# Patient Record
Sex: Male | Born: 1950 | Race: White | Hispanic: No | Marital: Married | Smoking: Former smoker
Health system: Southern US, Community
[De-identification: ages and names within clinical notes are randomized; demographics above are authoritative.]

## PROBLEM LIST (undated history)

## (undated) DIAGNOSIS — L709 Acne, unspecified: Secondary | ICD-10-CM

## (undated) DIAGNOSIS — F419 Anxiety disorder, unspecified: Secondary | ICD-10-CM

## (undated) DIAGNOSIS — K4090 Unilateral inguinal hernia, without obstruction or gangrene, not specified as recurrent: Secondary | ICD-10-CM

## (undated) DIAGNOSIS — I10 Essential (primary) hypertension: Secondary | ICD-10-CM

## (undated) DIAGNOSIS — R7303 Prediabetes: Secondary | ICD-10-CM

## (undated) DIAGNOSIS — L719 Rosacea, unspecified: Secondary | ICD-10-CM

## (undated) DIAGNOSIS — E785 Hyperlipidemia, unspecified: Secondary | ICD-10-CM

## (undated) DIAGNOSIS — M1990 Unspecified osteoarthritis, unspecified site: Secondary | ICD-10-CM

## (undated) DIAGNOSIS — I4892 Unspecified atrial flutter: Secondary | ICD-10-CM

## (undated) DIAGNOSIS — K648 Other hemorrhoids: Secondary | ICD-10-CM

## (undated) DIAGNOSIS — N200 Calculus of kidney: Secondary | ICD-10-CM

## (undated) DIAGNOSIS — M2550 Pain in unspecified joint: Secondary | ICD-10-CM

## (undated) DIAGNOSIS — E119 Type 2 diabetes mellitus without complications: Secondary | ICD-10-CM

## (undated) DIAGNOSIS — Z87442 Personal history of urinary calculi: Secondary | ICD-10-CM

## (undated) DIAGNOSIS — E039 Hypothyroidism, unspecified: Secondary | ICD-10-CM

## (undated) HISTORY — DX: Unspecified atrial flutter: I48.92

## (undated) HISTORY — PX: COLONOSCOPY: SHX174

## (undated) HISTORY — PX: TONSILLECTOMY: SUR1361

## (undated) HISTORY — DX: Calculus of kidney: N20.0

---

## 2002-04-18 ENCOUNTER — Ambulatory Visit (HOSPITAL_COMMUNITY): Admission: RE | Admit: 2002-04-18 | Discharge: 2002-04-18 | Payer: Self-pay | Admitting: Gastroenterology

## 2015-02-20 ENCOUNTER — Other Ambulatory Visit: Payer: Self-pay | Admitting: Family Medicine

## 2015-02-20 DIAGNOSIS — M545 Low back pain: Secondary | ICD-10-CM

## 2015-02-25 ENCOUNTER — Ambulatory Visit
Admission: RE | Admit: 2015-02-25 | Discharge: 2015-02-25 | Disposition: A | Discharge status: Home / Self Care | Payer: Medicare Other | Source: Ambulatory Visit | Attending: Family Medicine | Admitting: Family Medicine

## 2015-02-25 DIAGNOSIS — M545 Low back pain: Secondary | ICD-10-CM

## 2015-06-23 ENCOUNTER — Other Ambulatory Visit (HOSPITAL_COMMUNITY): Payer: Self-pay | Admitting: Neurological Surgery

## 2015-06-27 ENCOUNTER — Ambulatory Visit (HOSPITAL_COMMUNITY)
Admission: RE | Admit: 2015-06-27 | Discharge: 2015-06-27 | Disposition: A | Discharge status: Home / Self Care | Payer: Medicare Other | Source: Ambulatory Visit | Attending: Neurological Surgery | Admitting: Neurological Surgery

## 2015-06-27 ENCOUNTER — Encounter (HOSPITAL_COMMUNITY)
Admission: RE | Admit: 2015-06-27 | Discharge: 2015-06-27 | Disposition: A | Discharge status: Home / Self Care | Payer: Medicare Other | Source: Ambulatory Visit | Attending: Neurological Surgery | Admitting: Neurological Surgery

## 2015-06-27 ENCOUNTER — Encounter (HOSPITAL_COMMUNITY): Payer: Self-pay

## 2015-06-27 DIAGNOSIS — Z01818 Encounter for other preprocedural examination: Secondary | ICD-10-CM | POA: Diagnosis present

## 2015-06-27 DIAGNOSIS — I10 Essential (primary) hypertension: Secondary | ICD-10-CM | POA: Insufficient documentation

## 2015-06-27 DIAGNOSIS — Z01810 Encounter for preprocedural cardiovascular examination: Secondary | ICD-10-CM | POA: Insufficient documentation

## 2015-06-27 DIAGNOSIS — M4806 Spinal stenosis, lumbar region: Secondary | ICD-10-CM | POA: Diagnosis not present

## 2015-06-27 DIAGNOSIS — Z01812 Encounter for preprocedural laboratory examination: Secondary | ICD-10-CM | POA: Insufficient documentation

## 2015-06-27 DIAGNOSIS — R7303 Prediabetes: Secondary | ICD-10-CM | POA: Diagnosis not present

## 2015-06-27 DIAGNOSIS — M48061 Spinal stenosis, lumbar region without neurogenic claudication: Secondary | ICD-10-CM

## 2015-06-27 HISTORY — DX: Essential (primary) hypertension: I10

## 2015-06-27 HISTORY — DX: Hypothyroidism, unspecified: E03.9

## 2015-06-27 HISTORY — DX: Type 2 diabetes mellitus without complications: E11.9

## 2015-06-27 HISTORY — DX: Unspecified osteoarthritis, unspecified site: M19.90

## 2015-06-27 LAB — CBC WITH DIFFERENTIAL/PLATELET
Basophils Absolute: 0 10*3/uL (ref 0.0–0.1)
Basophils Relative: 1 %
EOS ABS: 0.1 10*3/uL (ref 0.0–0.7)
EOS PCT: 2 %
HCT: 44.4 % (ref 39.0–52.0)
Hemoglobin: 15.3 g/dL (ref 13.0–17.0)
LYMPHS ABS: 2.2 10*3/uL (ref 0.7–4.0)
LYMPHS PCT: 33 %
MCH: 32.2 pg (ref 26.0–34.0)
MCHC: 34.5 g/dL (ref 30.0–36.0)
MCV: 93.5 fL (ref 78.0–100.0)
MONO ABS: 0.5 10*3/uL (ref 0.1–1.0)
MONOS PCT: 8 %
Neutro Abs: 3.7 10*3/uL (ref 1.7–7.7)
Neutrophils Relative %: 56 %
PLATELETS: 185 10*3/uL (ref 150–400)
RBC: 4.75 MIL/uL (ref 4.22–5.81)
RDW: 12.4 % (ref 11.5–15.5)
WBC: 6.6 10*3/uL (ref 4.0–10.5)

## 2015-06-27 LAB — BASIC METABOLIC PANEL
Anion gap: 7 (ref 5–15)
BUN: 22 mg/dL — AB (ref 6–20)
CO2: 27 mmol/L (ref 22–32)
CREATININE: 0.92 mg/dL (ref 0.61–1.24)
Calcium: 9.6 mg/dL (ref 8.9–10.3)
Chloride: 105 mmol/L (ref 101–111)
GFR calc non Af Amer: >60 mL/min (ref >60)
GLUCOSE: 115 mg/dL — AB (ref 65–99)
Potassium: 4 mmol/L (ref 3.5–5.1)
Sodium: 139 mmol/L (ref 135–145)

## 2015-06-27 LAB — PROTIME-INR
INR: 1.13 (ref 0.00–1.49)
PROTHROMBIN TIME: 14.7 s (ref 11.6–15.2)

## 2015-06-27 LAB — SURGICAL PCR SCREEN
MRSA, PCR: NEGATIVE
STAPHYLOCOCCUS AUREUS: NEGATIVE

## 2015-06-27 LAB — GLUCOSE, CAPILLARY: Glucose-Capillary: 122 mg/dL — ABNORMAL HIGH (ref 65–99)

## 2015-06-27 NOTE — Pre-Procedure Instructions (Addendum)
Norman Hudson  06/27/2015      [facility] - [place], [place] - [address] [place] [address] Phone: [phone] Fax: [phone]    Your procedure is scheduled on Nov 28.  Report to [facility] Admitting at 530 A.M.  Call this number if you have problems the morning of surgery:  [phone]   Remember:  Do not eat food or drink liquids after midnight.  Take these medicines the morning of surgery with A SIP OF WATER: amlodipine (Norvasc), levothyroxine (Synthroid), Percocet if needed, valtrex  Stop taking aspirin, Ibuprofen, Aleve, BC's, Goody's, Herbal medications, Fish Oil, Multi vit, Zinc for 5 days before your surgery How to Manage Your Diabetes Before Surgery   Why is it important to control my blood sugar before and after surgery?   Improving blood sugar levels before and after surgery helps healing and can limit problems.  A way of improving blood sugar control is eating a healthy diet by:  - Eating less sugar and carbohydrates  - Increasing activity/exercise  - Talk with your doctor about reaching your blood sugar goals  High blood sugars (greater than 180 mg/dL) can raise your risk of infections and slow down your recovery so you will need to focus on controlling your diabetes during the weeks before surgery.  Make sure that the doctor who takes care of your diabetes knows about your planned surgery including the date and location.  How do I manage my blood sugars before surgery?   Check your blood sugar at least 4 times a day, 2 days before surgery to make sure that they are not too high or low.   Check your blood sugar the morning of your surgery when you wake up and every 2               hours until you get to the Short-Stay unit.  If your blood sugar is less than 70 mg/dL, you will need to treat for low blood sugar by:  Treat a low blood sugar (less than 70 mg/dL) with 1/2 cup of  clear juice (cranberry or apple), 4 glucose tablets, OR glucose gel.  Recheck blood sugar in 15 minutes after treatment (to make sure it is greater than 70 mg/dL).  If blood sugar is not greater than 70 mg/dL on re-check, call [phone] for further instructions.   Report your blood sugar to the Short-Stay nurse when you get to Short-Stay.  References:  [facility], 2007 "How to Manage your Diabetes Before and After Surgery".  What do I do about my diabetes medications?   Do not take oral diabetes medicines (pills) the morning of surgery.   If your CBG is greater than 220 mg/dL, you may take 1/2 of your sliding scale (correction) dose of insulin.    Do not wear jewelry, make-up or nail polish.  Do not wear lotions, powders, or perfumes.  You may wear deodorant.  Do not shave 48 hours prior to surgery.  Men may shave face and neck.  Do not bring valuables to the hospital.  [facility] is not responsible for any belongings or valuables.  Contacts, dentures or bridgework may not be worn into surgery.  Leave your suitcase in the car.  After surgery it may be brought to your room.  For patients admitted to the hospital, discharge time will be determined by your treatment team.  Patients discharged the day of  surgery will not be allowed to drive home.    Special instructions:  [place] - Preparing for Surgery  Before surgery, you can play an important role.  Because skin is not sterile, your skin needs to be as free of germs as possible.  You can reduce the number of germs on you skin by washing with CHG (chlorahexidine gluconate) soap before surgery.  CHG is an antiseptic cleaner which kills germs and bonds with the skin to continue killing germs even after washing.  Please DO NOT use if you have an allergy to CHG or antibacterial soaps.  If your skin becomes reddened/irritated stop using the CHG and inform your nurse when you arrive at Short Stay.  Do  not shave (including legs and underarms) for at least 48 hours prior to the first CHG shower.  You may shave your face.  Please follow these instructions carefully:   1.  Shower with CHG Soap the night before surgery and the    morning of Surgery.  2.  If you choose to wash your hair, wash your hair first as usual with your       normal shampoo.  3.  After you shampoo, rinse your hair and body thoroughly to remove the                      Shampoo.  4.  Use CHG as you would any other liquid soap.  You can apply chg directly       to the skin and wash gently with scrungie or a clean washcloth.  5.  Apply the CHG Soap to your body ONLY FROM THE NECK DOWN.        Do not use on open wounds or open sores.  Avoid contact with your eyes,       ears, mouth and genitals (private parts).  Wash genitals (private parts)       with your normal soap.  6.  Wash thoroughly, paying special attention to the area where your surgery        will be performed.  7.  Thoroughly rinse your body with warm water from the neck down.  8.  DO NOT shower/wash with your normal soap after using and rinsing off       the CHG Soap.  9.  Pat yourself dry with a clean towel.            10.  Wear clean pajamas.            11.  Place clean sheets on your bed the night of your first shower and do not        sleep with pets.  Day of Surgery  Do not apply any lotions/deoderants the morning of surgery.  Please wear clean clothes to the hospital/surgery center.     Please read over the following fact sheets that you were given. Pain Booklet, Coughing and Deep Breathing, MRSA Information and Surgical Site Infection Prevention

## 2015-06-27 NOTE — Progress Notes (Signed)
PCP is Dr Darcus Austin Denies ever seeing a cardiologist. Denies having a recent EKG. Denies ever having a card cath, stress test, or echo. States he had had 2 sleep studies done, but they were both neg for sleep apnea.

## 2015-06-28 LAB — HEMOGLOBIN A1C
Hgb A1c MFr Bld: 6.1 % — ABNORMAL HIGH (ref 4.8–5.6)
Mean Plasma Glucose: 128 mg/dL

## 2015-07-06 MED ORDER — DEXAMETHASONE SODIUM PHOSPHATE 10 MG/ML IJ SOLN
10.0000 mg | INTRAMUSCULAR | Status: DC
  Filled 2015-07-06: qty 1

## 2015-07-06 MED ORDER — CEFAZOLIN SODIUM-DEXTROSE 2-3 GM-% IV SOLR
2.0000 g | INTRAVENOUS | Status: AC
  Administered 2015-07-07: 2 g via INTRAVENOUS
  Filled 2015-07-06: qty 50

## 2015-07-07 ENCOUNTER — Ambulatory Visit (HOSPITAL_COMMUNITY)
Admission: RE | Admit: 2015-07-07 | Discharge: 2015-07-08 | Disposition: A | Discharge status: Home / Self Care | Payer: Medicare Other | Source: Ambulatory Visit | Attending: Neurological Surgery | Admitting: Neurological Surgery

## 2015-07-07 ENCOUNTER — Inpatient Hospital Stay (HOSPITAL_COMMUNITY): Payer: Medicare Other

## 2015-07-07 ENCOUNTER — Encounter (HOSPITAL_COMMUNITY): Payer: Self-pay | Admitting: Neurological Surgery

## 2015-07-07 ENCOUNTER — Inpatient Hospital Stay (HOSPITAL_COMMUNITY): Payer: Medicare Other | Admitting: Certified Registered"

## 2015-07-07 ENCOUNTER — Encounter (HOSPITAL_COMMUNITY)
Admission: RE | Disposition: A | Discharge status: Home / Self Care | Payer: Self-pay | Source: Ambulatory Visit | Attending: Neurological Surgery

## 2015-07-07 DIAGNOSIS — M1990 Unspecified osteoarthritis, unspecified site: Secondary | ICD-10-CM | POA: Diagnosis not present

## 2015-07-07 DIAGNOSIS — I10 Essential (primary) hypertension: Secondary | ICD-10-CM | POA: Insufficient documentation

## 2015-07-07 DIAGNOSIS — E039 Hypothyroidism, unspecified: Secondary | ICD-10-CM | POA: Diagnosis not present

## 2015-07-07 DIAGNOSIS — Z9889 Other specified postprocedural states: Secondary | ICD-10-CM

## 2015-07-07 DIAGNOSIS — Z79899 Other long term (current) drug therapy: Secondary | ICD-10-CM | POA: Diagnosis not present

## 2015-07-07 DIAGNOSIS — M4316 Spondylolisthesis, lumbar region: Secondary | ICD-10-CM | POA: Diagnosis not present

## 2015-07-07 DIAGNOSIS — Z419 Encounter for procedure for purposes other than remedying health state, unspecified: Secondary | ICD-10-CM

## 2015-07-07 DIAGNOSIS — Z7982 Long term (current) use of aspirin: Secondary | ICD-10-CM | POA: Insufficient documentation

## 2015-07-07 DIAGNOSIS — Z7984 Long term (current) use of oral hypoglycemic drugs: Secondary | ICD-10-CM | POA: Diagnosis not present

## 2015-07-07 DIAGNOSIS — M4806 Spinal stenosis, lumbar region: Secondary | ICD-10-CM | POA: Diagnosis not present

## 2015-07-07 DIAGNOSIS — R7303 Prediabetes: Secondary | ICD-10-CM | POA: Insufficient documentation

## 2015-07-07 HISTORY — PX: LUMBAR LAMINECTOMY WITH COFLEX 1 LEVEL: SHX6514

## 2015-07-07 LAB — GLUCOSE, CAPILLARY
GLUCOSE-CAPILLARY: 122 mg/dL — AB (ref 65–99)
GLUCOSE-CAPILLARY: 120 mg/dL — AB (ref 65–99)
Glucose-Capillary: 143 mg/dL — ABNORMAL HIGH (ref 65–99)
Glucose-Capillary: 160 mg/dL — ABNORMAL HIGH (ref 65–99)
Glucose-Capillary: 138 mg/dL — ABNORMAL HIGH (ref 65–99)

## 2015-07-07 SURGERY — LUMBAR LAMINECTOMY WITH COFLEX 1 LEVEL
Anesthesia: General | Site: Spine Lumbar

## 2015-07-07 MED ORDER — OXYCODONE HCL 5 MG PO TABS: 5.0000 mg | ORAL_TABLET | Freq: Once | ORAL | Status: DC | PRN

## 2015-07-07 MED ORDER — 0.9 % SODIUM CHLORIDE (POUR BTL) OPTIME
TOPICAL | Status: DC | PRN
  Administered 2015-07-07: 1000 mL

## 2015-07-07 MED ORDER — MORPHINE SULFATE (PF) 2 MG/ML IV SOLN
INTRAVENOUS | Status: AC
  Filled 2015-07-07: qty 1

## 2015-07-07 MED ORDER — HYDROMORPHONE HCL 1 MG/ML IJ SOLN
INTRAMUSCULAR | Status: AC
  Filled 2015-07-07: qty 1

## 2015-07-07 MED ORDER — EPHEDRINE SULFATE 50 MG/ML IJ SOLN
INTRAMUSCULAR | Status: DC | PRN
  Administered 2015-07-07: 10 mg via INTRAVENOUS

## 2015-07-07 MED ORDER — METHOCARBAMOL 1000 MG/10ML IJ SOLN
500.0000 mg | Freq: Four times a day (QID) | INTRAVENOUS | Status: DC | PRN
  Filled 2015-07-07: qty 5

## 2015-07-07 MED ORDER — HEMOSTATIC AGENTS (NO CHARGE) OPTIME
TOPICAL | Status: DC | PRN
  Administered 2015-07-07: 1 via TOPICAL

## 2015-07-07 MED ORDER — GLYCOPYRROLATE 0.2 MG/ML IJ SOLN
INTRAMUSCULAR | Status: AC
  Filled 2015-07-07: qty 3

## 2015-07-07 MED ORDER — MIDAZOLAM HCL 5 MG/5ML IJ SOLN
INTRAMUSCULAR | Status: DC | PRN
  Administered 2015-07-07: 2 mg via INTRAVENOUS

## 2015-07-07 MED ORDER — ONDANSETRON HCL 4 MG/2ML IJ SOLN
INTRAMUSCULAR | Status: DC | PRN
  Administered 2015-07-07: 4 mg via INTRAVENOUS

## 2015-07-07 MED ORDER — SODIUM CHLORIDE 0.9 % IJ SOLN: 3.0000 mL | INTRAMUSCULAR | Status: DC | PRN

## 2015-07-07 MED ORDER — ROCURONIUM BROMIDE 50 MG/5ML IV SOLN
INTRAVENOUS | Status: AC
  Filled 2015-07-07: qty 1

## 2015-07-07 MED ORDER — SUCCINYLCHOLINE CHLORIDE 20 MG/ML IJ SOLN
INTRAMUSCULAR | Status: AC
  Filled 2015-07-07: qty 1

## 2015-07-07 MED ORDER — PHENYLEPHRINE 40 MCG/ML (10ML) SYRINGE FOR IV PUSH (FOR BLOOD PRESSURE SUPPORT)
PREFILLED_SYRINGE | INTRAVENOUS | Status: AC
  Filled 2015-07-07: qty 10

## 2015-07-07 MED ORDER — PHENYLEPHRINE HCL 10 MG/ML IJ SOLN
10.0000 mg | INTRAVENOUS | Status: DC | PRN
  Administered 2015-07-07: 30 ug/min via INTRAVENOUS

## 2015-07-07 MED ORDER — AMLODIPINE BESYLATE 5 MG PO TABS
5.0000 mg | ORAL_TABLET | Freq: Two times a day (BID) | ORAL | Status: DC
  Filled 2015-07-07 (×4): qty 1

## 2015-07-07 MED ORDER — FENTANYL CITRATE (PF) 100 MCG/2ML IJ SOLN
INTRAMUSCULAR | Status: DC | PRN
  Administered 2015-07-07 (×2): 50 ug via INTRAVENOUS
  Administered 2015-07-07: 150 ug via INTRAVENOUS

## 2015-07-07 MED ORDER — SODIUM CHLORIDE 0.9 % IV SOLN: 250.0000 mL | INTRAVENOUS | Status: DC

## 2015-07-07 MED ORDER — GLYCOPYRROLATE 0.2 MG/ML IJ SOLN
INTRAMUSCULAR | Status: DC | PRN
  Administered 2015-07-07: 0.6 mg via INTRAVENOUS

## 2015-07-07 MED ORDER — FENTANYL CITRATE (PF) 250 MCG/5ML IJ SOLN
INTRAMUSCULAR | Status: AC
  Filled 2015-07-07: qty 5

## 2015-07-07 MED ORDER — LIDOCAINE HCL (CARDIAC) 20 MG/ML IV SOLN
INTRAVENOUS | Status: AC
  Filled 2015-07-07: qty 5

## 2015-07-07 MED ORDER — PHENYLEPHRINE HCL 10 MG/ML IJ SOLN
INTRAMUSCULAR | Status: DC | PRN
  Administered 2015-07-07 (×4): 40 ug via INTRAVENOUS

## 2015-07-07 MED ORDER — HYDROMORPHONE HCL 1 MG/ML IJ SOLN
0.2500 mg | INTRAMUSCULAR | Status: DC | PRN
  Administered 2015-07-07 (×4): 0.5 mg via INTRAVENOUS

## 2015-07-07 MED ORDER — ONDANSETRON HCL 4 MG/2ML IJ SOLN: 4.0000 mg | INTRAMUSCULAR | Status: DC | PRN

## 2015-07-07 MED ORDER — NEOSTIGMINE METHYLSULFATE 10 MG/10ML IV SOLN
INTRAVENOUS | Status: AC
  Filled 2015-07-07: qty 1

## 2015-07-07 MED ORDER — METHOCARBAMOL 500 MG PO TABS
ORAL_TABLET | ORAL | Status: AC
  Filled 2015-07-07: qty 1

## 2015-07-07 MED ORDER — ALPRAZOLAM 0.5 MG PO TABS: 0.5000 mg | ORAL_TABLET | Freq: Every evening | ORAL | Status: DC | PRN

## 2015-07-07 MED ORDER — HYDROCHLOROTHIAZIDE 25 MG PO TABS: 25.0000 mg | ORAL_TABLET | Freq: Every day | ORAL | Status: DC

## 2015-07-07 MED ORDER — ROCURONIUM BROMIDE 100 MG/10ML IV SOLN
INTRAVENOUS | Status: DC | PRN
  Administered 2015-07-07: 50 mg via INTRAVENOUS

## 2015-07-07 MED ORDER — METHOCARBAMOL 500 MG PO TABS
500.0000 mg | ORAL_TABLET | Freq: Four times a day (QID) | ORAL | Status: DC | PRN
  Administered 2015-07-07 – 2015-07-08 (×4): 500 mg via ORAL
  Filled 2015-07-07 (×3): qty 1

## 2015-07-07 MED ORDER — OXYCODONE-ACETAMINOPHEN 5-325 MG PO TABS
1.0000 | ORAL_TABLET | ORAL | Status: DC | PRN
  Administered 2015-07-07 – 2015-07-08 (×6): 2 via ORAL
  Filled 2015-07-07 (×5): qty 2

## 2015-07-07 MED ORDER — MORPHINE SULFATE (PF) 2 MG/ML IV SOLN
1.0000 mg | INTRAVENOUS | Status: DC | PRN
  Administered 2015-07-07: 2 mg via INTRAVENOUS
  Administered 2015-07-07: 4 mg via INTRAVENOUS
  Administered 2015-07-07 – 2015-07-08 (×2): 2 mg via INTRAVENOUS
  Filled 2015-07-07: qty 2
  Filled 2015-07-07 (×2): qty 1

## 2015-07-07 MED ORDER — THROMBIN 5000 UNITS EX SOLR
CUTANEOUS | Status: DC | PRN
  Administered 2015-07-07 (×2): 5000 [IU] via TOPICAL

## 2015-07-07 MED ORDER — NEOSTIGMINE METHYLSULFATE 10 MG/10ML IV SOLN
INTRAVENOUS | Status: DC | PRN
  Administered 2015-07-07: 4 mg via INTRAVENOUS

## 2015-07-07 MED ORDER — POTASSIUM CHLORIDE IN NACL 20-0.9 MEQ/L-% IV SOLN
INTRAVENOUS | Status: DC
  Filled 2015-07-07 (×3): qty 1000

## 2015-07-07 MED ORDER — MIDAZOLAM HCL 2 MG/2ML IJ SOLN
INTRAMUSCULAR | Status: AC
  Filled 2015-07-07: qty 2

## 2015-07-07 MED ORDER — ONDANSETRON HCL 4 MG/2ML IJ SOLN
INTRAMUSCULAR | Status: AC
  Filled 2015-07-07: qty 2

## 2015-07-07 MED ORDER — ONDANSETRON HCL 4 MG/2ML IJ SOLN: 4.0000 mg | Freq: Four times a day (QID) | INTRAMUSCULAR | Status: DC | PRN

## 2015-07-07 MED ORDER — OXYCODONE-ACETAMINOPHEN 5-325 MG PO TABS
ORAL_TABLET | ORAL | Status: AC
  Filled 2015-07-07: qty 2

## 2015-07-07 MED ORDER — MENTHOL 3 MG MT LOZG: 1.0000 | LOZENGE | OROMUCOSAL | Status: DC | PRN

## 2015-07-07 MED ORDER — PROPOFOL 10 MG/ML IV BOLUS
INTRAVENOUS | Status: AC
  Filled 2015-07-07: qty 20

## 2015-07-07 MED ORDER — THROMBIN 5000 UNITS EX SOLR
OROMUCOSAL | Status: DC | PRN
  Administered 2015-07-07: 5 mL via TOPICAL

## 2015-07-07 MED ORDER — LIDOCAINE HCL (CARDIAC) 20 MG/ML IV SOLN
INTRAVENOUS | Status: DC | PRN
  Administered 2015-07-07: 80 mg via INTRAVENOUS

## 2015-07-07 MED ORDER — VALACYCLOVIR HCL 500 MG PO TABS
500.0000 mg | ORAL_TABLET | Freq: Every day | ORAL | Status: DC
  Filled 2015-07-07: qty 1

## 2015-07-07 MED ORDER — OXYCODONE HCL 5 MG/5ML PO SOLN: 5.0000 mg | Freq: Once | ORAL | Status: DC | PRN

## 2015-07-07 MED ORDER — SODIUM CHLORIDE 0.9 % IJ SOLN
3.0000 mL | Freq: Two times a day (BID) | INTRAMUSCULAR | Status: DC
  Administered 2015-07-07: 3 mL via INTRAVENOUS

## 2015-07-07 MED ORDER — BUPIVACAINE HCL (PF) 0.25 % IJ SOLN
INTRAMUSCULAR | Status: DC | PRN
  Administered 2015-07-07: 3 mL

## 2015-07-07 MED ORDER — ACETAMINOPHEN 650 MG RE SUPP: 650.0000 mg | RECTAL | Status: DC | PRN

## 2015-07-07 MED ORDER — IRBESARTAN 300 MG PO TABS
300.0000 mg | ORAL_TABLET | Freq: Every day | ORAL | Status: DC
  Filled 2015-07-07: qty 1

## 2015-07-07 MED ORDER — PHENOL 1.4 % MT LIQD: 1.0000 | OROMUCOSAL | Status: DC | PRN

## 2015-07-07 MED ORDER — ACETAMINOPHEN 325 MG PO TABS: 650.0000 mg | ORAL_TABLET | ORAL | Status: DC | PRN

## 2015-07-07 MED ORDER — LEVOTHYROXINE SODIUM 112 MCG PO TABS
112.0000 ug | ORAL_TABLET | Freq: Every day | ORAL | Status: DC
  Administered 2015-07-08: 112 ug via ORAL
  Filled 2015-07-07 (×2): qty 1

## 2015-07-07 MED ORDER — PROPOFOL 10 MG/ML IV BOLUS
INTRAVENOUS | Status: DC | PRN
  Administered 2015-07-07: 180 mg via INTRAVENOUS

## 2015-07-07 MED ORDER — VALSARTAN-HYDROCHLOROTHIAZIDE 320-25 MG PO TABS: 1.0000 | ORAL_TABLET | Freq: Every day | ORAL | Status: DC

## 2015-07-07 MED ORDER — METFORMIN HCL 500 MG PO TABS
500.0000 mg | ORAL_TABLET | Freq: Every day | ORAL | Status: DC
  Administered 2015-07-08: 500 mg via ORAL
  Filled 2015-07-07: qty 1

## 2015-07-07 MED ORDER — CEFAZOLIN SODIUM 1-5 GM-% IV SOLN
1.0000 g | Freq: Three times a day (TID) | INTRAVENOUS | Status: AC
  Administered 2015-07-07 (×2): 1 g via INTRAVENOUS
  Filled 2015-07-07 (×2): qty 50

## 2015-07-07 MED ORDER — SODIUM CHLORIDE 0.9 % IR SOLN
Status: DC | PRN
  Administered 2015-07-07: 500 mL

## 2015-07-07 MED ORDER — LACTATED RINGERS IV SOLN
INTRAVENOUS | Status: DC | PRN
  Administered 2015-07-07 (×2): via INTRAVENOUS

## 2015-07-07 SURGICAL SUPPLY — 47 items
BAG DECANTER FOR FLEXI CONT (MISCELLANEOUS) ×2 IMPLANT
BENZOIN TINCTURE PRP APPL 2/3 (GAUZE/BANDAGES/DRESSINGS) ×2 IMPLANT
BUR MATCHSTICK NEURO 3.0 LAGG (BURR) ×2 IMPLANT
CANISTER SUCT 3000ML PPV (MISCELLANEOUS) ×2 IMPLANT
DEVICE COFLEX STABLIZATION 12M (Neuro Prosthesis/Implant) ×2 IMPLANT
DRAPE C-ARM 42X72 X-RAY (DRAPES) ×4 IMPLANT
DRAPE LAPAROTOMY 100X72X124 (DRAPES) ×2 IMPLANT
DRAPE MICROSCOPE LEICA (MISCELLANEOUS) IMPLANT
DRAPE POUCH INSTRU U-SHP 10X18 (DRAPES) ×2 IMPLANT
DRAPE SURG 17X23 STRL (DRAPES) ×2 IMPLANT
DRSG OPSITE 4X5.5 SM (GAUZE/BANDAGES/DRESSINGS) ×2 IMPLANT
DRSG OPSITE POSTOP 4X6 (GAUZE/BANDAGES/DRESSINGS) ×2 IMPLANT
DURAPREP 26ML APPLICATOR (WOUND CARE) ×2 IMPLANT
ELECT REM PT RETURN 9FT ADLT (ELECTROSURGICAL) ×2
ELECTRODE REM PT RTRN 9FT ADLT (ELECTROSURGICAL) ×1 IMPLANT
EVACUATOR 1/8 PVC DRAIN (DRAIN) ×2 IMPLANT
GAUZE SPONGE 4X4 16PLY XRAY LF (GAUZE/BANDAGES/DRESSINGS) IMPLANT
GLOVE BIO SURGEON STRL SZ8 (GLOVE) ×2 IMPLANT
GLOVE BIOGEL PI IND STRL 7.0 (GLOVE) ×3 IMPLANT
GLOVE BIOGEL PI IND STRL 7.5 (GLOVE) ×1 IMPLANT
GLOVE BIOGEL PI INDICATOR 7.0 (GLOVE) ×3
GLOVE BIOGEL PI INDICATOR 7.5 (GLOVE) ×1
GLOVE ECLIPSE 8.0 STRL XLNG CF (GLOVE) ×2 IMPLANT
GOWN STRL REUS W/ TWL LRG LVL3 (GOWN DISPOSABLE) ×1 IMPLANT
GOWN STRL REUS W/ TWL XL LVL3 (GOWN DISPOSABLE) ×2 IMPLANT
GOWN STRL REUS W/TWL 2XL LVL3 (GOWN DISPOSABLE) IMPLANT
GOWN STRL REUS W/TWL LRG LVL3 (GOWN DISPOSABLE) ×1
GOWN STRL REUS W/TWL XL LVL3 (GOWN DISPOSABLE) ×2
HEMOSTAT POWDER KIT SURGIFOAM (HEMOSTASIS) ×2 IMPLANT
KIT BASIN OR (CUSTOM PROCEDURE TRAY) ×2 IMPLANT
KIT ROOM TURNOVER OR (KITS) ×2 IMPLANT
NEEDLE HYPO 25X1 1.5 SAFETY (NEEDLE) ×2 IMPLANT
NEEDLE SPNL 20GX3.5 QUINCKE YW (NEEDLE) IMPLANT
NS IRRIG 1000ML POUR BTL (IV SOLUTION) ×2 IMPLANT
PACK LAMINECTOMY NEURO (CUSTOM PROCEDURE TRAY) ×2 IMPLANT
PAD ARMBOARD 7.5X6 YLW CONV (MISCELLANEOUS) ×10 IMPLANT
RUBBERBAND STERILE (MISCELLANEOUS) IMPLANT
SPONGE SURGIFOAM ABS GEL SZ50 (HEMOSTASIS) ×2 IMPLANT
STRIP CLOSURE SKIN 1/2X4 (GAUZE/BANDAGES/DRESSINGS) ×2 IMPLANT
SUT BONE WAX W31G (SUTURE) ×2 IMPLANT
SUT VIC AB 0 CT1 18XCR BRD8 (SUTURE) ×1 IMPLANT
SUT VIC AB 0 CT1 8-18 (SUTURE) ×1
SUT VIC AB 2-0 CP2 18 (SUTURE) ×2 IMPLANT
SUT VIC AB 3-0 SH 8-18 (SUTURE) ×2 IMPLANT
TOWEL OR 17X24 6PK STRL BLUE (TOWEL DISPOSABLE) IMPLANT
TOWEL OR 17X26 10 PK STRL BLUE (TOWEL DISPOSABLE) ×2 IMPLANT
WATER STERILE IRR 1000ML POUR (IV SOLUTION) ×2 IMPLANT

## 2015-07-07 NOTE — Anesthesia Procedure Notes (Signed)
Procedure Name: Intubation Date/Time: 07/07/2015 7:32 AM Performed by: Lavell Luster Pre-anesthesia Checklist: Patient identified, Emergency Drugs available, Suction available, Patient being monitored and Timeout performed Patient Re-evaluated:Patient Re-evaluated prior to inductionOxygen Delivery Method: Circle system utilized Preoxygenation: Pre-oxygenation with 100% oxygen Intubation Type: IV induction and Cricoid Pressure applied Ventilation: Mask ventilation without difficulty Laryngoscope Size: Mac and 4 Grade View: Grade II Tube type: Oral Tube size: 7.5 mm Number of attempts: 1 Airway Equipment and Method: Stylet Placement Confirmation: ETT inserted through vocal cords under direct vision,  positive ETCO2 and breath sounds checked- equal and bilateral Secured at: 22 cm Tube secured with: Tape Dental Injury: Teeth and Oropharynx as per pre-operative assessment

## 2015-07-07 NOTE — Anesthesia Postprocedure Evaluation (Signed)
Anesthesia Post Note  Patient: Norman Hudson  Procedure(s) Performed: Procedure(s) (LRB): LUMBAR FOUR-FIVE LUMBAR LAMINECTOMY WITH COFLEX (N/A)  Patient location during evaluation: PACU Anesthesia Type: General Level of consciousness: awake and alert and patient cooperative Pain management: pain level controlled Vital Signs Assessment: post-procedure vital signs reviewed and stable Respiratory status: spontaneous breathing and respiratory function stable Cardiovascular status: stable Anesthetic complications: no    Last Vitals:  Filed Vitals:   07/07/15 0953 07/07/15 1008  BP:  117/70  Pulse: 67 67  Temp: 36.4 C 36.6 C  Resp: 11 16    Last Pain:  Filed Vitals:   07/07/15 1136  PainSc: 8       LLE Sensation: Full sensation   RLE Sensation: Full sensation      Evert Wenrich S

## 2015-07-07 NOTE — Transfer of Care (Signed)
Immediate Anesthesia Transfer of Care Note  Patient: Norman Hudson  Procedure(s) Performed: Procedure(s): LUMBAR FOUR-FIVE LUMBAR LAMINECTOMY WITH COFLEX (N/A)  Patient Location: PACU  Anesthesia Type:General  Level of Consciousness: awake, alert  and oriented  Airway & Oxygen Therapy: Patient connected to face mask oxygen  Post-op Assessment: Report given to RN  Post vital signs: stable  Last Vitals:  Filed Vitals:   07/07/15 0550  BP: 133/73  Pulse: 71  Temp: 37 C  Resp: 16    Complications: No apparent anesthesia complications

## 2015-07-07 NOTE — Op Note (Signed)
07/07/2015  9:05 AM  PATIENT:  Norman Hudson  64 y.o. 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 Hal Neer  ANESTHESIA:   General  EBL: 100 ml  Total I/O In: 1000 [I.V.:1000] Out: 100 [Blood:100]  BLOOD ADMINISTERED:none  DRAINS: med hemovac   SPECIMEN:  No Specimen  INDICATION FOR PROCEDURE: This patient presented with back and leg pain on the left. He had a MRI which showed a grade 1 spondylolisthesis with severe spinal stenosis at L4-5. He tried medical management without relief. Recommended decompression followed by placement of a coflex interlaminar device. Patient understood the risks, benefits, and alternatives and potential outcomes and wished to proceed.  PROCEDURE DETAILS: The patient was taken to the operating room and after induction of adequate generalized endotracheal anesthesia, the patient was rolled into the prone position on the Wilson frame and all pressure points were padded. The lumbar region was cleaned and then prepped with DuraPrep and draped in the usual sterile fashion. 5 cc of local anesthesia was injected and then a dorsal midline incision was made and carried down to the lumbo sacral fascia. The fascia was opened and the paraspinous musculature was taken down in a subperiosteal fashion to expose L4-5 bilaterally. Intraoperative x-ray confirmed my level, and then I used a combination of the high-speed drill and the Kerrison punches to perform a hemilaminectomy, medial facetectomy, and foraminotomy at L4-5 bilaterally. The underlying yellow ligament was opened and removed in a piecemeal fashion to expose the underlying dura and exiting nerve root. I undercut the lateral recess and  dissected down until I was medial to and distal to the pedicle. The nerve root was well decompressed on both sides. I then palpated with a coronary dilator along the nerve root and into the foramen to assure adequate decompression. I felt no more compression of the nerve root. I then placed my 12 mm trial for the interlaminar device. This offered 2 mm of distraction. We then placed our device and squeezed the spinous process clamps closed. I then palpated under the device to make sure I was above the dura. We checked our construct with AP and lateral fluoroscopy. A medium Hemovac drain was placed. I irrigated with saline solution containing bacitracin. Achieved hemostasis with bipolar cautery, lined the dura with Gelfoam, and then closed the fascia with 0 Vicryl. I closed the subcutaneous tissues with 2-0 Vicryl and the subcuticular tissues with 3-0 Vicryl. The skin was then closed with benzoin and Steri-Strips. The drapes were removed, a sterile dressing was applied. The patient was awakened from general anesthesia and transferred to the recovery room in stable condition. At the end of the procedure all sponge, needle and instrument counts were correct.   PLAN OF CARE: Admit to inpatient   PATIENT DISPOSITION:  PACU - hemodynamically stable.   Delay start of Pharmacological VTE agent (>24hrs) due to surgical blood loss or risk of bleeding:  yes

## 2015-07-07 NOTE — H&P (Signed)
Subjective: Patient is a 64 y.o. male admitted for spinal stenosis. Onset of symptoms was many months, progressively worse since that time.  The pain is rated severe, and is located at the low back and radiates to legs. The pain is described as aching and occurs most of the time. The symptoms have been progressive. Symptoms are exacerbated by exercise. MRI or CT showed stenosis with grade 1 spondylolisthesis.   Past Medical History  Diagnosis Date  . Hypertension   . Hypothyroidism   . Diabetes mellitus without complication (HCC)     borderline  . Arthritis     Past Surgical History  Procedure Laterality Date  . Tonsillectomy    . Colonoscopy      Prior to Admission medications   Medication Sig Start Date End Date Taking? Authorizing Provider  ALPRAZolam Duanne Moron) 0.5 MG tablet Take 0.5 mg by mouth at bedtime as needed for anxiety.   Yes Historical Provider, MD  amLODipine (NORVASC) 5 MG tablet Take 5 mg by mouth 2 (two) times daily.   Yes Historical Provider, MD  aspirin EC 81 MG tablet Take 81 mg by mouth daily.   Yes Historical Provider, MD  cholecalciferol (VITAMIN D) 1000 UNITS tablet Take 1,000 Units by mouth daily.   Yes Historical Provider, MD  doxycycline (VIBRA-TABS) 100 MG tablet Take 100 mg by mouth daily as needed (for adult acne).   Yes Historical Provider, MD  levothyroxine (SYNTHROID, LEVOTHROID) 112 MCG tablet Take 112 mcg by mouth daily before breakfast.   Yes Historical Provider, MD  metFORMIN (GLUCOPHAGE) 500 MG tablet Take 500 mg by mouth daily with breakfast.   Yes Historical Provider, MD  Multiple Vitamins-Minerals (MULTIVITAMIN WITH MINERALS) tablet Take 1 tablet by mouth daily.   Yes Historical Provider, MD  oxyCODONE-acetaminophen (PERCOCET) 7.5-325 MG tablet Take 1 tablet by mouth every 8 (eight) hours as needed for moderate pain or severe pain.   Yes Historical Provider, MD  pravastatin (PRAVACHOL) 20 MG tablet Take 20 mg by mouth at bedtime.   Yes Historical  Provider, MD  valACYclovir (VALTREX) 500 MG tablet Take 500 mg by mouth daily.   Yes Historical Provider, MD  valsartan-hydrochlorothiazide (DIOVAN-HCT) 320-25 MG tablet Take 1 tablet by mouth daily.   Yes Historical Provider, MD  vitamin B-12 (CYANOCOBALAMIN) 500 MCG tablet Take 500 mcg by mouth daily.   Yes Historical Provider, MD  Zinc 25 MG TABS Take 25 mg by mouth daily.   Yes Historical Provider, MD   Allergies  Allergen Reactions  . Tramadol Itching    rash    Social History  Substance Use Topics  . Smoking status: Never Smoker   . Smokeless tobacco: Not on file  . Alcohol Use: Yes     Comment: occ    History reviewed. No pertinent family history.   Review of Systems  Positive ROS: neg  All other systems have been reviewed and were otherwise negative with the exception of those mentioned in the HPI and as above.  Objective: Vital signs in last 24 hours: Temp:  [98.6 F (37 C)] 98.6 F (37 C) (11/28 0550) Pulse Rate:  [71] 71 (11/28 0550) Resp:  [16] 16 (11/28 0550) BP: (133)/(73) 133/73 mmHg (11/28 0550) SpO2:  [98 %] 98 % (11/28 0550)  General Appearance: Alert, cooperative, no distress, appears stated age Head: Normocephalic, without obvious abnormality, atraumatic Eyes: PERRL, conjunctiva/corneas clear, EOM's intact    Neck: Supple, symmetrical, trachea midline Back: Symmetric, no curvature, ROM normal, no CVA tenderness  Lungs:  respirations unlabored Heart: Regular rate and rhythm Abdomen: Soft, non-tender Extremities: Extremities normal, atraumatic, no cyanosis or edema Pulses: 2+ and symmetric all extremities Skin: Skin color, texture, turgor normal, no rashes or lesions  NEUROLOGIC:   Mental status: Alert and oriented x4,  no aphasia, good attention span, fund of knowledge, and memory Motor Exam - grossly normal Sensory Exam - grossly normal Reflexes: 1+ Coordination - grossly normal Gait - grossly normal Balance - grossly normal Cranial  Nerves: I: smell Not tested  II: visual acuity  OS: nl    OD: nl  II: visual fields Full to confrontation  II: pupils Equal, round, reactive to light  III,VII: ptosis None  III,IV,VI: extraocular muscles  Full ROM  V: mastication Normal  V: facial light touch sensation  Normal  V,VII: corneal reflex  Present  VII: facial muscle function - upper  Normal  VII: facial muscle function - lower Normal  VIII: hearing Not tested  IX: soft palate elevation  Normal  IX,X: gag reflex Present  XI: trapezius strength  5/5  XI: sternocleidomastoid strength 5/5  XI: neck flexion strength  5/5  XII: tongue strength  Normal    Data Review Lab Results  Component Value Date   WBC 6.6 06/27/2015   HGB 15.3 06/27/2015   HCT 44.4 06/27/2015   MCV 93.5 06/27/2015   PLT 185 06/27/2015   Lab Results  Component Value Date   NA 139 06/27/2015   K 4.0 06/27/2015   CL 105 06/27/2015   CO2 27 06/27/2015   BUN 22* 06/27/2015   CREATININE 0.92 06/27/2015   GLUCOSE 115* 06/27/2015   Lab Results  Component Value Date   INR 1.13 06/27/2015    Assessment/Plan: Patient admitted for DLL with coflex L4-5. Patient has failed a reasonable attempt at conservative therapy.  I explained the condition and procedure to the patient and answered any questions.  Patient wishes to proceed with procedure as planned. Understands risks/ benefits and typical outcomes of procedure.   Dwayna Kentner S 07/07/2015 6:16 AM

## 2015-07-07 NOTE — Anesthesia Preprocedure Evaluation (Addendum)
Anesthesia Evaluation  Patient identified by MRN, date of birth, ID band Patient awake    Reviewed: Allergy & Precautions, NPO status , Patient's Chart, lab work & pertinent test results  Airway Mallampati: II  TM Distance: >3 FB Neck ROM: full    Dental  (+) Teeth Intact, Dental Advisory Given   Pulmonary neg pulmonary ROS,    breath sounds clear to auscultation       Cardiovascular hypertension, Pt. on medications  Rhythm:regular Rate:Normal     Neuro/Psych    GI/Hepatic   Endo/Other  diabetes, Well Controlled, Type 2, Oral Hypoglycemic AgentsHypothyroidism   Renal/GU      Musculoskeletal  (+) Arthritis ,   Abdominal (+)  Abdomen: soft. Bowel sounds: normal.  Peds  Hematology   Anesthesia Other Findings   Reproductive/Obstetrics                           Anesthesia Physical Anesthesia Plan  ASA: II  Anesthesia Plan: General   Post-op Pain Management:    Induction: Intravenous  Airway Management Planned: Oral ETT  Additional Equipment:   Intra-op Plan:   Post-operative Plan: Extubation in OR  Informed Consent: I have reviewed the patients History and Physical, chart, labs and discussed the procedure including the risks, benefits and alternatives for the proposed anesthesia with the patient or authorized representative who has indicated his/her understanding and acceptance.     Plan Discussed with:   Anesthesia Plan Comments:         Anesthesia Quick Evaluation

## 2015-07-08 ENCOUNTER — Encounter (HOSPITAL_COMMUNITY): Payer: Self-pay | Admitting: Neurological Surgery

## 2015-07-08 DIAGNOSIS — M4806 Spinal stenosis, lumbar region: Secondary | ICD-10-CM | POA: Diagnosis not present

## 2015-07-08 LAB — GLUCOSE, CAPILLARY: GLUCOSE-CAPILLARY: 119 mg/dL — AB (ref 65–99)

## 2015-07-08 MED ORDER — OXYCODONE-ACETAMINOPHEN 7.5-325 MG PO TABS: 1.0000 | ORAL_TABLET | ORAL | Status: DC | PRN

## 2015-07-08 NOTE — Discharge Instructions (Signed)
Wound Care Keep incision covered and dry for one week.  If you shower prior to then, cover incision with plastic wrap.  You may remove outer bandage after one week and shower.  Do not put any creams, lotions, or ointments on incision. Leave steri-strips on neck.  They will fall off by themselves. Activity Walk each and every day, increasing distance each day. No lifting greater than 5 lbs.  Avoid bending, arching, or twisting. No driving for 2 weeks; may ride as a passenger locally. If provided with back brace, wear when out of bed.  It is not necessary to wear in bed. Diet Resume your normal diet.  Return to Work Will be discussed at you follow up appointment. Call Your Doctor If Any of These Occur Redness, drainage, or swelling at the wound.  Temperature greater than 101 degrees. Severe pain not relieved by pain medication. Incision starts to come apart. Follow Up Appt Call today for appointment in 1-2 weeks [phone]) or for problems.  If you have any hardware placed in your spine, you will need an x-ray before your appointment.  Laminectomy During a laminectomy, small pieces of bone in the spine called lamina are removed. The ligaments underneath the lamina and parts of the joints that have grown too large are also removed. This takes pressure off the nerves.  LET [facility] CARE PROVIDER KNOW ABOUT:  Any allergies you have.  All medicines you are taking, including vitamins, herbs, eye drops, creams, and over-the-counter medicines.  Previous problems you or members of your family have had with the use of anesthetics.  Any blood disorders you have.  Previous surgeries you have had.  Medical conditions you have. RISKS AND COMPLICATIONS  Generally, laminectomy is a safe procedure. However, as with any procedure, complications can occur. Possible complications include:  Infection near the incision.  Nerve damage. Signs of this can be pain, weakness, or numbness.  Leaking  of spinal fluid.  Blood clot in a leg. The clot can move to the lungs. This can be very serious.  Bowel or bladder incontinence (rare). BEFORE THE PROCEDURE   You will need to stop taking certain medicines as directed by your health care provider.  If you smoke, stop at least 2 weeks before the procedure. Smoking can slow down the healing process and increase the risk of complications.  Do not eat or drink anything for at least 8 hours before the procedure. Take any medicines that your health care provider tells you to keep taking with a sip of water.  Do not drink alcohol the day before your surgery.  Tell your health care provider if you develop a cold or any infection before your surgery.  Arrange for someone to drive you home after the procedure or after your hospital stay. Also arrange for someone to help you with activities during recovery. PROCEDURE  Small monitors will be placed on your body. They are used to check your heart, blood pressure, and oxygen level.  An IV tube will be inserted into one of your veins. Medicine will flow directly into your body through the IV tube.  You might be given a sedative. This will help you relax.  You will be given a medicine to make you sleep (general anesthetic), and a breathing tube will be placed into your lungs. During general anesthesia, you are unaware of the procedure and do not feel any pain.  Your back will be cleaned with a special solution to kill germs on your  skin.  Once you are asleep, the surgeon will make a 2-inch to 5-inch cut (incision) in your back. The length of the incision will depend on how many spinal bones (vertebrae) are being operated on.  Muscles in the back will be moved away from the vertebrae and pulled to the side.  Pieces of lamina will be removed.  The ligament that lies under the lamina and connects your vertebrae will be removed.  Enough ligaments and thickened joints will be removed to take  pressure off your nerves.  Your nerves will be identified, and their passage will be tracked and assessed for excessive tightness.  Your back muscles will be moved back into their normal position.  The area under your skin will be closed with small, absorbable stitches. These stitches do not need to be removed.  Your skin will be closed with small absorbable stitches or staples.  A dressing will be put over your incision.  The procedure may take 1-3 hours. AFTER THE PROCEDURE   You will stay in a recovery area until the anesthesia has worn off. Your blood pressure and pulse will be checked every so often. Then you will be taken to a hospital room.  You may continue to get fluids through the IV tube for a while.  Some pain is normal. You may be given pain medicine while still in the recovery area.  It is important to be up and moving as soon as possible after a surgery. Physical therapists will help you start walking.  To prevent blood clots in your legs:  You may be given special stockings to wear.  You may need to take medicine to prevent clots.  You may be asked to do special breathing exercises to re-expand your lungs. This is to prevent a lung infection.  Most people stay in the hospital for 1-3 days after a laminectomy.   This information is not intended to replace advice given to you by your health care provider. Make sure you discuss any questions you have with your health care provider.   Document Released: 07/14/2009 Document Revised: 05/16/2013 Document Reviewed: 03/07/2013 Elsevier Interactive Patient Education [employer].

## 2015-07-08 NOTE — Discharge Summary (Signed)
Physician Discharge Summary  Patient ID: ELIGIO MEKELBURG MRN: [id number] DOB/AGE: 1950/12/22 64 y.o.  Admit date: 07/07/2015 Discharge date: 07/08/2015  Admission Diagnoses: Spinal stenosis with spondylolisthesis    Discharge Diagnoses: Same   Discharged Condition: good  Hospital Course: The patient was admitted on 07/07/2015 and taken to the operating room where the patient underwent lumbar decompression and placement of a Coflex device. The patient tolerated the procedure well and was taken to the recovery room and then to the floor in stable condition. The hospital course was routine. There were no complications. The wound remained clean dry and intact. Pt had appropriate back soreness. No complaints of leg pain or new N/T/W. The patient remained afebrile with stable vital signs, and tolerated a regular diet. The patient continued to increase activities, and pain was well controlled with oral pain medications.   Consults: None  Significant Diagnostic Studies:  Results for orders placed or performed during the hospital encounter of 07/07/15  Glucose, capillary  Result Value Ref Range   Glucose-Capillary 143 (H) 65 - 99 mg/dL  Glucose, capillary  Result Value Ref Range   Glucose-Capillary 122 (H) 65 - 99 mg/dL   Comment 1 Notify RN   Glucose, capillary  Result Value Ref Range   Glucose-Capillary 160 (H) 65 - 99 mg/dL   Comment 1 Notify RN    Comment 2 Document in Chart   Glucose, capillary  Result Value Ref Range   Glucose-Capillary 120 (H) 65 - 99 mg/dL   Comment 1 Notify RN    Comment 2 Document in Chart   Glucose, capillary  Result Value Ref Range   Glucose-Capillary 138 (H) 65 - 99 mg/dL   Comment 1 Notify RN    Comment 2 Document in Chart     Chest 2 View  06/27/2015  CLINICAL DATA:  Preoperative exam for spinal surgery, history of hypertension, pre diabetes. EXAM: CHEST  2 VIEW COMPARISON:  None in PACs FINDINGS: The lungs are adequately inflated and clear.  The heart and pulmonary vascularity are normal. The mediastinum is normal in width. There is no pleural effusion. There is mild multilevel degenerative disc disease of the thoracic spine. IMPRESSION: There is no active cardiopulmonary disease. Electronically Signed   By: Tia Gelb  [place] M.D.   On: 06/27/2015 13:06   Dg Lumbar Spine 2-3 Views  07/07/2015  CLINICAL DATA:  L4-5 laminectomy with colflex. EXAM: DG C-ARM 61-120 MIN; LUMBAR SPINE - 2-3 VIEW COMPARISON:  03/21/2015 FINDINGS: Vertebral body heights are within normal. Disc spaces are preserved. Mild spondylosis is present. Suggestion of subtle grade 1 anterolisthesis of L4 on L5 unchanged. Hardware over the posterior elements at the L4-5 level intact. Remainder the exam is unchanged. IMPRESSION: Hardware intact over the posterior elements at the L4-5 level. Electronically Signed   By: Elberta Fortis M.D.   On: 07/07/2015 09:32   Dg C-arm 1-60 Min  07/07/2015  CLINICAL DATA:  L4-5 laminectomy with colflex. EXAM: DG C-ARM 61-120 MIN; LUMBAR SPINE - 2-3 VIEW COMPARISON:  03/21/2015 FINDINGS: Vertebral body heights are within normal. Disc spaces are preserved. Mild spondylosis is present. Suggestion of subtle grade 1 anterolisthesis of L4 on L5 unchanged. Hardware over the posterior elements at the L4-5 level intact. Remainder the exam is unchanged. IMPRESSION: Hardware intact over the posterior elements at the L4-5 level. Electronically Signed   By: Elberta Fortis M.D.   On: 07/07/2015 09:32    Antibiotics:  Anti-infectives    Start     Dose/Rate  Route Frequency Ordered Stop   07/08/15 1000  valACYclovir (VALTREX) tablet 500 mg     500 mg Oral Daily 07/07/15 1014     07/07/15 1530  ceFAZolin (ANCEF) IVPB 1 g/50 mL premix     1 g 100 mL/hr over 30 Minutes Intravenous Every 8 hours 07/07/15 1014 07/07/15 2303   07/07/15 0817  bacitracin 50,000 Units in sodium chloride irrigation 0.9 % 500 mL irrigation  Status:  Discontinued       As needed  07/07/15 0817 07/07/15 0903   07/07/15 0500  ceFAZolin (ANCEF) IVPB 2 g/50 mL premix     2 g 100 mL/hr over 30 Minutes Intravenous To [facility] 07/06/15 1352 07/07/15 0802      Discharge Exam: Blood pressure 119/63, pulse 75, temperature 98.2 F (36.8 C), temperature source Oral, resp. rate 18, height 5\' 9"  (1.753 m), weight 92.08 kg (203 lb), SpO2 98 %. Neurologic: Grossly normal Incision clean dry and intact  Discharge Medications:     Medication List    TAKE these medications        ALPRAZolam 0.5 MG tablet  Commonly known as:  XANAX  Take 0.5 mg by mouth at bedtime as needed for anxiety.     amLODipine 5 MG tablet  Commonly known as:  NORVASC  Take 5 mg by mouth 2 (two) times daily.     aspirin EC 81 MG tablet  Take 81 mg by mouth daily.     cholecalciferol 1000 UNITS tablet  Commonly known as:  VITAMIN D  Take 1,000 Units by mouth daily.     doxycycline 100 MG tablet  Commonly known as:  VIBRA-TABS  Take 100 mg by mouth daily as needed (for adult acne).     levothyroxine 112 MCG tablet  Commonly known as:  SYNTHROID, LEVOTHROID  Take 112 mcg by mouth daily before breakfast.     metFORMIN 500 MG tablet  Commonly known as:  GLUCOPHAGE  Take 500 mg by mouth daily with breakfast.     multivitamin with minerals tablet  Take 1 tablet by mouth daily.     oxyCODONE-acetaminophen 7.5-325 MG tablet  Commonly known as:  PERCOCET  Take 1 tablet by mouth every 4 (four) hours as needed for moderate pain or severe pain.     pravastatin 20 MG tablet  Commonly known as:  PRAVACHOL  Take 20 mg by mouth at bedtime.     valACYclovir 500 MG tablet  Commonly known as:  VALTREX  Take 500 mg by mouth daily.     valsartan-hydrochlorothiazide 320-25 MG tablet  Commonly known as:  DIOVAN-HCT  Take 1 tablet by mouth daily.     vitamin B-12 500 MCG tablet  Commonly known as:  CYANOCOBALAMIN  Take 500 mcg by mouth daily.     Zinc 25 MG Tabs  Take 25 mg by mouth  daily.        Disposition: Home   Final Dx:  decompressive laminectomy with placement of a flexed device      Discharge Instructions     Remove dressing in 72 hours    Complete by:  As directed      Call MD for:  difficulty breathing, headache or visual disturbances    Complete by:  As directed      Call MD for:  persistant nausea and vomiting    Complete by:  As directed      Call MD for:  redness, tenderness, or signs of infection (pain,  swelling, redness, odor or green/yellow discharge around incision site)    Complete by:  As directed      Call MD for:  severe uncontrolled pain    Complete by:  As directed      Call MD for:  temperature >100.4    Complete by:  As directed      Diet - low sodium heart healthy    Complete by:  As directed      Discharge instructions    Complete by:  As directed   No heavy lifting, no bending or twisting, may shower, no driving     Increase activity slowly    Complete by:  As directed               Signed: Atalia Litzinger S 07/08/2015, 8:29 AM

## 2015-07-08 NOTE — Progress Notes (Signed)
Pt doing well. Pt given D/C instructions with Rx, verbal understanding was provided. Pt's IV and Hemovac were removed prior to D/C. Pt's incision is clean and dry with no sign of infection. Pt D/C'd home via wheelchair @ [phone] per MD order. Pt is stable @ D/C and has no other needs at this time. Holli Humbles, RN

## 2015-09-10 HISTORY — PX: LITHOTRIPSY: SUR834

## 2015-10-09 ENCOUNTER — Encounter (HOSPITAL_COMMUNITY): Payer: Self-pay | Admitting: *Deleted

## 2015-10-09 ENCOUNTER — Other Ambulatory Visit: Payer: Self-pay | Admitting: Urology

## 2015-10-13 ENCOUNTER — Ambulatory Visit (HOSPITAL_COMMUNITY): Payer: Medicare Other

## 2015-10-13 ENCOUNTER — Encounter (HOSPITAL_COMMUNITY): Payer: Self-pay

## 2015-10-13 ENCOUNTER — Ambulatory Visit (HOSPITAL_COMMUNITY)
Admission: RE | Admit: 2015-10-13 | Discharge: 2015-10-13 | Disposition: A | Discharge status: Home / Self Care | Payer: Medicare Other | Source: Ambulatory Visit | Attending: Urology | Admitting: Urology

## 2015-10-13 ENCOUNTER — Encounter (HOSPITAL_COMMUNITY)
Admission: RE | Disposition: A | Discharge status: Home / Self Care | Payer: Self-pay | Source: Ambulatory Visit | Attending: Urology

## 2015-10-13 DIAGNOSIS — N200 Calculus of kidney: Secondary | ICD-10-CM | POA: Diagnosis present

## 2015-10-13 DIAGNOSIS — I10 Essential (primary) hypertension: Secondary | ICD-10-CM | POA: Diagnosis not present

## 2015-10-13 DIAGNOSIS — E119 Type 2 diabetes mellitus without complications: Secondary | ICD-10-CM | POA: Diagnosis not present

## 2015-10-13 DIAGNOSIS — E039 Hypothyroidism, unspecified: Secondary | ICD-10-CM | POA: Diagnosis not present

## 2015-10-13 LAB — GLUCOSE, CAPILLARY: Glucose-Capillary: 102 mg/dL — ABNORMAL HIGH (ref 65–99)

## 2015-10-13 SURGERY — LITHOTRIPSY, ESWL
Anesthesia: LOCAL | Laterality: Right

## 2015-10-13 MED ORDER — CIPROFLOXACIN HCL 500 MG PO TABS
500.0000 mg | ORAL_TABLET | ORAL | Status: AC
Start: 2015-10-13 — End: 2015-10-13
  Administered 2015-10-13: 500 mg via ORAL
  Filled 2015-10-13: qty 1

## 2015-10-13 MED ORDER — DIPHENHYDRAMINE HCL 25 MG PO CAPS
25.0000 mg | ORAL_CAPSULE | ORAL | Status: AC
  Administered 2015-10-13: 25 mg via ORAL
  Filled 2015-10-13: qty 1

## 2015-10-13 MED ORDER — DIAZEPAM 5 MG PO TABS
10.0000 mg | ORAL_TABLET | ORAL | Status: AC
  Administered 2015-10-13: 10 mg via ORAL
  Filled 2015-10-13: qty 2

## 2015-10-13 MED ORDER — SODIUM CHLORIDE 0.9 % IV SOLN
INTRAVENOUS | Status: DC
  Administered 2015-10-13: 16:00:00 via INTRAVENOUS

## 2015-10-13 NOTE — Discharge Instructions (Signed)
Lithotripsy, Care After °Refer to this sheet in the next few weeks. These instructions provide you with information on caring for yourself after your procedure. Your health care provider may also give you more specific instructions. Your treatment has been planned according to current medical practices, but problems sometimes occur. Call your health care provider if you have any problems or questions after your procedure. °WHAT TO EXPECT AFTER THE PROCEDURE  °· Your urine may have a red tinge for a few days after treatment. Blood loss is usually minimal. °· You may have soreness in the back or flank area. This usually goes away after a few days. The procedure can cause blotches or bruises on the back where the pressure wave enters the skin. These marks usually cause only minimal discomfort and should disappear in a short time. °· Stone fragments should begin to pass within 24 hours of treatment. However, a delayed passage is not unusual. °· You may have pain, discomfort, and feel sick to your stomach (nauseated) when the crushed fragments of stone are passed down the tube from the kidney to the bladder. Stone fragments can pass soon after the procedure and may last for up to 4-8 weeks. °· A small number of patients may have severe pain when stone fragments are not able to pass, which leads to an obstruction. °· If your stone is greater than 1 inch (2.5 cm) in diameter or if you have multiple stones that have a combined diameter greater than 1 inch (2.5 cm), you may require more than one treatment. °· If you had a stent placed prior to your procedure, you may experience some discomfort, especially during urination. You may experience the pain or discomfort in your flank or back, or you may experience a sharp pain or discomfort at the base of your penis or in your lower abdomen. The discomfort usually lasts only a few minutes after urinating. °HOME CARE INSTRUCTIONS  °· Rest at home until you feel your energy  improving. °· Only take over-the-counter or prescription medicines for pain, discomfort, or fever as directed by your health care provider. Depending on the type of lithotripsy, you may need to take antibiotics and anti-inflammatory medicines for a few days. °· Drink enough water and fluids to keep your urine clear or pale yellow. This helps "flush" your kidneys. It helps pass any remaining pieces of stone and prevents stones from coming back. °· Most people can resume daily activities within 1-2 days after standard lithotripsy. It can take longer to recover from laser and percutaneous lithotripsy. °· Strain all urine through the provided strainer. Keep all particulate matter and stones for your health care provider to see. The stone may be as small as a grain of salt. It is very important to use the strainer each and every time you pass your urine. Any stones that are found can be sent to a medical lab for examination. °· Visit your health care provider for a follow-up appointment in a few weeks. Your doctor may remove your stent if you have one. Your health care provider will also check to see whether stone particles still remain. °SEEK MEDICAL CARE IF:  °· Your pain is not relieved by medicine. °· You have a lasting nauseous feeling. °· You feel there is too much blood in the urine. °· You develop persistent problems with frequent or painful urination that does not at least partially improve after 2 days following the procedure. °· You have a congested cough. °· You feel   lightheaded. °· You develop a rash or any other signs that might suggest an allergic problem. °· You develop any reaction or side effects to your medicine(s). °SEEK IMMEDIATE MEDICAL CARE IF:  °· You experience severe back or flank pain or both. °· You see nothing but blood when you urinate. °· You cannot pass any urine at all. °· You have a fever or shaking chills. °· You develop shortness of breath, difficulty breathing, or chest pain. °· You  develop vomiting that will not stop after 6-8 hours. °· You have a fainting episode. °  °This information is not intended to replace advice given to you by your health care provider. Make sure you discuss any questions you have with your health care provider. °  °Document Released: 08/15/2007 Document Revised: 04/16/2015 Document Reviewed: 02/08/2013 °Elsevier Interactive Patient Education ©2016 [employer] ° °

## 2015-10-16 NOTE — H&P (Signed)
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 This is his first stone event. He denies any LUTS. No nausea/vomiting  Past Medical History  Diagnosis Date  . Hypertension   . Hypothyroidism   . Diabetes mellitus without complication (HCC)     borderline  . Arthritis    Past Surgical History  Procedure Laterality Date  . Tonsillectomy    . Colonoscopy    . Lumbar laminectomy with coflex 1 level N/A 07/07/2015    Procedure: LUMBAR FOUR-FIVE LUMBAR LAMINECTOMY WITH COFLEX;  Surgeon: Eustace Moore, MD;  Location: [facility];  Service: Neurosurgery;  Laterality: N/A;    Home Medications:  No prescriptions prior to admission   Allergies:  Allergies  Allergen Reactions  . Tramadol Itching    rash    History reviewed. No pertinent family history. Social History:  reports that he has never smoked. He does not have any smokeless tobacco history on file. He reports that he drinks alcohol. He reports that he does not use illicit drugs.  Review of Systems  Gastrointestinal: Positive for nausea.  Genitourinary: Positive for flank pain.  All other systems reviewed and are negative.   Physical Exam:  Vital signs in last 24 hours:   Physical Exam  Constitutional: He is oriented to person, place, and time. He appears well-developed and well-nourished.  HENT:  Head: Normocephalic and atraumatic.  Eyes: EOM are normal. Pupils are equal, round, and reactive to light.  Neck: Normal range of motion. No thyromegaly present.  Cardiovascular: Normal rate and regular rhythm.   Respiratory: Effort normal. No respiratory distress.  GI: Soft. He exhibits no distension and no mass. There is no tenderness. There is no rebound and no guarding.  Musculoskeletal: Normal range of motion.  Neurological: He is alert and oriented to person,  place, and time.  Skin: Skin is warm and dry.  Psychiatric: He has a normal mood and affect. His behavior is normal. Judgment and thought content normal.    Laboratory Data:  No results found for this or any previous visit (from the past 24 hour(s)). No results found for this or any previous visit (from the past 240 hour(s)). Creatinine: No results for input(s): CREATININE in the last 168 hours. Baseline Creatinine: unknown  Impression/Assessment:  64yo with R UPJ calculus  Plan:  The risks/beenfits/alternatives to R ESWL was explaiend to the patient and he understands and wishes to proceed with surgery  MCKENZIE, PATRICK L 10/16/2015, 11:17 AM

## 2016-04-27 ENCOUNTER — Other Ambulatory Visit: Payer: Self-pay | Admitting: Neurological Surgery

## 2016-04-27 DIAGNOSIS — M4316 Spondylolisthesis, lumbar region: Secondary | ICD-10-CM

## 2016-05-05 ENCOUNTER — Ambulatory Visit
Admission: RE | Admit: 2016-05-05 | Discharge: 2016-05-05 | Disposition: A | Discharge status: Home / Self Care | Payer: Medicare Other | Source: Ambulatory Visit | Attending: Neurological Surgery | Admitting: Neurological Surgery

## 2016-05-05 VITALS — BP 138/83 | HR 61

## 2016-05-05 DIAGNOSIS — M4316 Spondylolisthesis, lumbar region: Secondary | ICD-10-CM

## 2016-05-05 DIAGNOSIS — Z9889 Other specified postprocedural states: Secondary | ICD-10-CM

## 2016-05-05 MED ORDER — DIAZEPAM 5 MG PO TABS
5.0000 mg | ORAL_TABLET | Freq: Once | ORAL | Status: AC
  Administered 2016-05-05: 5 mg via ORAL

## 2016-05-05 MED ORDER — ONDANSETRON HCL 4 MG/2ML IJ SOLN: 4.0000 mg | Freq: Four times a day (QID) | INTRAMUSCULAR | Status: DC | PRN

## 2016-05-05 MED ORDER — IOPAMIDOL (ISOVUE-M 200) INJECTION 41%
15.0000 mL | Freq: Once | INTRAMUSCULAR | Status: AC
  Administered 2016-05-05: 15 mL via INTRATHECAL

## 2016-05-05 NOTE — Discharge Instructions (Signed)

## 2016-05-07 ENCOUNTER — Telehealth: Payer: Self-pay

## 2016-05-07 NOTE — Telephone Encounter (Signed)
LMOM at home number asking patient how he is doing after his myelogram here 05/05/16.  Ralene Bathe

## 2016-07-05 ENCOUNTER — Ambulatory Visit: Payer: Self-pay | Admitting: Physician Assistant

## 2016-07-05 NOTE — H&P (Signed)
TOTAL HIP ADMISSION H&P  Patient is admitted for left total hip arthroplasty.  Subjective:  Chief Complaint: left hip pain  HPI: Norman Hudson, 65 y.o. male, has a history of pain and functional disability in the left hip(s) due to arthritis and patient has failed non-surgical conservative treatments for greater than 12 weeks to include NSAID's and/or analgesics, corticosteriod injections and activity modification.  Onset of symptoms was gradual starting 5 years ago with gradually worsening course since that time.The patient noted no past surgery on the left hip(s).  Patient currently rates pain in the left hip at 8 out of 10 with activity. Patient has night pain, worsening of pain with activity and weight bearing and pain that interfers with activities of daily living. Patient has evidence of periarticular osteophytes and joint space narrowing by imaging studies. This condition presents safety issues increasing the risk of falls. There is no current active infection.  Patient Active Problem List   Diagnosis Date Noted  . S/P lumbar laminectomy 07/07/2015   Past Medical History:  Diagnosis Date  . Arthritis   . Diabetes mellitus without complication (HCC)    borderline  . Hypertension   . Hypothyroidism     Past Surgical History:  Procedure Laterality Date  . COLONOSCOPY    . LUMBAR LAMINECTOMY WITH COFLEX 1 LEVEL N/A 07/07/2015   Procedure: LUMBAR FOUR-FIVE LUMBAR LAMINECTOMY WITH COFLEX;  Surgeon: Eustace Moore, MD;  Location: [facility];  Service: Neurosurgery;  Laterality: N/A;  . TONSILLECTOMY       (Not in a hospital admission) Allergies  Allergen Reactions  . Tramadol Itching    rash    Social History  Substance Use Topics  . Smoking status: Never Smoker  . Smokeless tobacco: Not on file  . Alcohol use Yes     Comment: occ    No family history on file.   Review of Systems  Musculoskeletal: Positive for joint pain.  All other systems reviewed and are  negative.   Objective:  Physical Exam  Constitutional: He is oriented to person, place, and time. He appears well-developed and well-nourished. No distress.  HENT:  Head: Normocephalic and atraumatic.  Nose: Nose normal.  Eyes: Conjunctivae and EOM are normal. Pupils are equal, round, and reactive to light.  Neck: Normal range of motion. Neck supple.  Cardiovascular: Normal rate, regular rhythm, normal heart sounds and intact distal pulses.   Respiratory: Effort normal and breath sounds normal. No respiratory distress. He has no wheezes.  GI: Soft. Bowel sounds are normal. He exhibits no distension. There is no tenderness.  Musculoskeletal:       Left hip: He exhibits decreased range of motion, tenderness and bony tenderness.  Lymphadenopathy:    He has no cervical adenopathy.  Neurological: He is alert and oriented to person, place, and time. No cranial nerve deficit.  Skin: Skin is warm and dry. No rash noted. No erythema.  Psychiatric: He has a normal mood and affect. His behavior is normal.    Vital signs in last 24 hours: @VSRANGES @  Labs:   Estimated body mass index is 28.19 kg/m as calculated from the following:   Height as of 10/13/15: 5\' 10"  (1.778 m).   Weight as of 10/13/15: 89.1 kg (196 lb 8 oz).   Imaging Review Plain radiographs demonstrate moderate degenerative joint disease of the left hip(s). The bone quality appears to be good for age and reported activity level.  Assessment/Plan:  End stage arthritis, left hip(s)  The patient history, physical examination, clinical judgement of the provider and imaging studies are consistent with end stage degenerative joint disease of the left hip(s) and total hip arthroplasty is deemed medically necessary. The treatment options including medical management, injection therapy, arthroscopy and arthroplasty were discussed at length. The risks and benefits of total hip arthroplasty were presented and reviewed. The risks due to  aseptic loosening, infection, stiffness, dislocation/subluxation,  thromboembolic complications and other imponderables were discussed.  The patient acknowledged the explanation, agreed to proceed with the plan and consent was signed. Patient is being admitted for inpatient treatment for surgery, pain control, PT, OT, prophylactic antibiotics, VTE prophylaxis, progressive ambulation and ADL's and discharge planning.The patient is planning to be discharged home with home health services

## 2016-07-12 ENCOUNTER — Other Ambulatory Visit: Payer: Self-pay

## 2016-07-12 ENCOUNTER — Encounter (HOSPITAL_COMMUNITY)
Admission: RE | Admit: 2016-07-12 | Discharge: 2016-07-12 | Disposition: A | Discharge status: Home / Self Care | Payer: Medicare Other | Source: Ambulatory Visit | Attending: Orthopedic Surgery | Admitting: Orthopedic Surgery

## 2016-07-12 ENCOUNTER — Ambulatory Visit (HOSPITAL_COMMUNITY)
Admission: RE | Admit: 2016-07-12 | Discharge: 2016-07-12 | Disposition: A | Discharge status: Home / Self Care | Payer: Medicare Other | Source: Ambulatory Visit | Attending: Physician Assistant | Admitting: Physician Assistant

## 2016-07-12 ENCOUNTER — Encounter (HOSPITAL_COMMUNITY): Payer: Self-pay

## 2016-07-12 DIAGNOSIS — M1712 Unilateral primary osteoarthritis, left knee: Secondary | ICD-10-CM | POA: Diagnosis not present

## 2016-07-12 DIAGNOSIS — Z01818 Encounter for other preprocedural examination: Secondary | ICD-10-CM | POA: Insufficient documentation

## 2016-07-12 DIAGNOSIS — Z0183 Encounter for blood typing: Secondary | ICD-10-CM | POA: Insufficient documentation

## 2016-07-12 DIAGNOSIS — M1612 Unilateral primary osteoarthritis, left hip: Secondary | ICD-10-CM

## 2016-07-12 DIAGNOSIS — Z01812 Encounter for preprocedural laboratory examination: Secondary | ICD-10-CM | POA: Insufficient documentation

## 2016-07-12 HISTORY — DX: Pain in unspecified joint: M25.50

## 2016-07-12 HISTORY — DX: Other hemorrhoids: K64.8

## 2016-07-12 HISTORY — DX: Acne, unspecified: L70.9

## 2016-07-12 HISTORY — DX: Personal history of urinary calculi: Z87.442

## 2016-07-12 HISTORY — DX: Rosacea, unspecified: L71.9

## 2016-07-12 HISTORY — DX: Unilateral inguinal hernia, without obstruction or gangrene, not specified as recurrent: K40.90

## 2016-07-12 HISTORY — DX: Hyperlipidemia, unspecified: E78.5

## 2016-07-12 HISTORY — DX: Anxiety disorder, unspecified: F41.9

## 2016-07-12 LAB — CBC WITH DIFFERENTIAL/PLATELET
BASOS ABS: 0 10*3/uL (ref 0.0–0.1)
BASOS PCT: 0 %
EOS ABS: 0.1 10*3/uL (ref 0.0–0.7)
EOS PCT: 1 %
HCT: 49 % (ref 39.0–52.0)
Hemoglobin: 16.5 g/dL (ref 13.0–17.0)
LYMPHS PCT: 17 %
Lymphs Abs: 1.7 10*3/uL (ref 0.7–4.0)
MCH: 31.5 pg (ref 26.0–34.0)
MCHC: 33.7 g/dL (ref 30.0–36.0)
MCV: 93.5 fL (ref 78.0–100.0)
Monocytes Absolute: 0.6 10*3/uL (ref 0.1–1.0)
Monocytes Relative: 7 %
Neutro Abs: 7.1 10*3/uL (ref 1.7–7.7)
Neutrophils Relative %: 75 %
PLATELETS: 170 10*3/uL (ref 150–400)
RBC: 5.24 MIL/uL (ref 4.22–5.81)
RDW: 13.3 % (ref 11.5–15.5)
WBC: 9.5 10*3/uL (ref 4.0–10.5)

## 2016-07-12 LAB — COMPREHENSIVE METABOLIC PANEL
ALBUMIN: 4.4 g/dL (ref 3.5–5.0)
ALT: 30 U/L (ref 17–63)
AST: 36 U/L (ref 15–41)
Alkaline Phosphatase: 49 U/L (ref 38–126)
Anion gap: 10 (ref 5–15)
BUN: 17 mg/dL (ref 6–20)
CHLORIDE: 106 mmol/L (ref 101–111)
CO2: 23 mmol/L (ref 22–32)
CREATININE: 0.94 mg/dL (ref 0.61–1.24)
Calcium: 9.5 mg/dL (ref 8.9–10.3)
GFR calc Af Amer: >60 mL/min (ref >60)
GFR calc non Af Amer: >60 mL/min (ref >60)
GLUCOSE: 115 mg/dL — AB (ref 65–99)
POTASSIUM: 3.9 mmol/L (ref 3.5–5.1)
SODIUM: 139 mmol/L (ref 135–145)
Total Bilirubin: 0.9 mg/dL (ref 0.3–1.2)
Total Protein: 7 g/dL (ref 6.5–8.1)

## 2016-07-12 LAB — SURGICAL PCR SCREEN
MRSA, PCR: NEGATIVE
STAPHYLOCOCCUS AUREUS: NEGATIVE

## 2016-07-12 LAB — URINALYSIS, ROUTINE W REFLEX MICROSCOPIC
BILIRUBIN URINE: NEGATIVE
GLUCOSE, UA: NEGATIVE mg/dL
HGB URINE DIPSTICK: NEGATIVE
Ketones, ur: NEGATIVE mg/dL
Leukocytes, UA: NEGATIVE
Nitrite: NEGATIVE
PH: 6.5 (ref 5.0–8.0)
Protein, ur: NEGATIVE mg/dL
SPECIFIC GRAVITY, URINE: 1.023 (ref 1.005–1.030)

## 2016-07-12 LAB — ABO/RH: ABO/RH(D): B POS

## 2016-07-12 LAB — TYPE AND SCREEN
ABO/RH(D): B POS
ANTIBODY SCREEN: NEGATIVE

## 2016-07-12 LAB — PROTIME-INR
INR: 1.14
Prothrombin Time: 14.7 seconds (ref 11.4–15.2)

## 2016-07-12 LAB — GLUCOSE, CAPILLARY: GLUCOSE-CAPILLARY: 122 mg/dL — AB (ref 65–99)

## 2016-07-12 LAB — APTT: APTT: 30 s (ref 24–36)

## 2016-07-12 MED ORDER — CHLORHEXIDINE GLUCONATE 4 % EX LIQD: 60.0000 mL | Freq: Once | CUTANEOUS | Status: DC

## 2016-07-12 NOTE — Progress Notes (Deleted)
Cardiologist  Medical Md is Dr.Walter Pharr  Echo  Stress test  Heart cath  EKG   CXR

## 2016-07-12 NOTE — Progress Notes (Signed)
   07/12/16 1115  OBSTRUCTIVE SLEEP APNEA  Have you ever been diagnosed with sleep apnea through a sleep study? No (sleep study done > 15 yrs ago and no sleep apnea confirmed)  Do you snore loudly (loud enough to be heard through closed doors)?  1  Do you often feel tired, fatigued, or sleepy during the daytime (such as falling asleep during driving or talking to someone)? 0  Has anyone observed you stop breathing during your sleep? 1  Do you have, or are you being treated for high blood pressure? 1  BMI more than 35 kg/m2? 0  Age > 50 (1-yes) 1  Neck circumference greater than:Male 16 inches or larger, Male 17inches or larger? 0 (16.5)  Male Gender (Yes=1) 1  Obstructive Sleep Apnea Score 5  Score 5 or greater  Results sent to PCP

## 2016-07-12 NOTE — Pre-Procedure Instructions (Signed)
[place]  07/12/2016      [place], [place] [address] Phone: [phone] Fax: [phone]    Your procedure is scheduled on Fri, Dec 15 @ 10:15 AM  Report to [facility] Admitting at 8:15 AM  Call this number if you have problems the morning of surgery:  [phone]   Remember:  Do not eat food or drink liquids after midnight.  Take these medicines the morning of surgery with A SIP OF WATER Amlodipine(Norvasc),Synthroid(Levothyroxine),Pain Pill(if needed),and Valtrex(Valacyclovir)            Stop taking your Mobic,Aspirin,along with any Vitamins or Herbal Medications a week prior to surgery. No Goody's,BC's,Aleve,Advil,Motrin,Ibuprofen,or Fish Oil.      How to Manage Your Diabetes Before and After Surgery  Why is it important to control my blood sugar before and after surgery? . Improving blood sugar levels before and after surgery helps healing and can limit problems. . A way of improving blood sugar control is eating a healthy diet by: o  Eating less sugar and carbohydrates o  Increasing activity/exercise o  Talking with your doctor about reaching your blood sugar goals . High blood sugars (greater than 180 mg/dL) can raise your risk of infections and slow your recovery, so you will need to focus on controlling your diabetes during the weeks before surgery. . Make sure that the doctor who takes care of your diabetes knows about your planned surgery including the date and location.  How do I manage my blood sugar before surgery? . Check your blood sugar at least 4 times a day, starting 2 days before surgery, to make sure that the level is not too high or low. o Check your blood sugar the morning of your surgery when you wake up and every 2 hours until you get to the Short Stay unit. . If your blood sugar is less than 70 mg/dL, you will need to treat for low blood  sugar: o Do not take insulin. o Treat a low blood sugar (less than 70 mg/dL) with  cup of clear juice (cranberry or apple), 4 glucose tablets, OR glucose gel. o Recheck blood sugar in 15 minutes after treatment (to make sure it is greater than 70 mg/dL). If your blood sugar is not greater than 70 mg/dL on recheck, call [phone] for further instructions. . Report your blood sugar to the short stay nurse when you get to Short Stay.  . If you are admitted to the hospital after surgery: o Your blood sugar will be checked by the staff and you will probably be given insulin after surgery (instead of oral diabetes medicines) to make sure you have good blood sugar levels. o The goal for blood sugar control after surgery is 80-180 mg/dL.              WHAT DO I DO ABOUT MY DIABETES MEDICATION?   Marland Kitchen Do not take oral diabetes medicines (pills) the morning of surgery.       . The day of surgery, do not take other diabetes injectables, including Byetta (exenatide), Bydureon (exenatide ER), Victoza (liraglutide), or Trulicity (dulaglutide).  . If your CBG is greater than 220 mg/dL, you may take  of your sliding scale (correction) dose of insulin.  Other Instructions:           Reviewed and Endorsed by [facility] Patient Education Committee, August 2015  Do not wear jewelry, make-up or nail polish.  Do not wear lotions, powders, or perfumes, or deoderant.  Do not shave 48 hours prior to surgery.  Men may shave face and neck.  Do not bring valuables to the hospital.  [facility] is not responsible for any belongings or valuables.  Contacts, dentures or bridgework may not be worn into surgery.  Leave your suitcase in the car.  After surgery it may be brought to your room.  For patients admitted to the hospital, discharge time will be determined by your treatment team.  Patients discharged the day of surgery will not be allowed to drive home.    [facility] Preparing for [facility]  Before surgery, you can play an important role.  Because skin is not sterile, your skin needs to be as free of germs as possible.  You can reduce the number of germs on you skin by washing with CHG (chlorahexidine gluconate) soap before surgery.  CHG is an antiseptic cleaner which kills germs and bonds with the skin to continue killing germs even after washing.  Please DO NOT use if you have an allergy to CHG or antibacterial soaps.  If your skin becomes reddened/irritated stop using the CHG and inform your nurse when you arrive at Short Stay.  Do not shave (including legs and underarms) for at least 48 hours prior to the first CHG shower.  You may shave your face.  Please follow these instructions carefully:   1.  Shower with CHG Soap the night before surgery and the                                morning of Surgery.  2.  If you choose to wash your hair, wash your hair first as usual with your       normal shampoo.  3.  After you shampoo, rinse your hair and body thoroughly to remove the                      Shampoo.  4.  Use CHG as you would any other liquid soap.  You can apply chg directly       to the skin and wash gently with scrungie or a clean washcloth.  5.  Apply the CHG Soap to your body ONLY FROM THE NECK DOWN.        Do not use on open wounds or open sores.  Avoid contact with your eyes,       ears, mouth and genitals (private parts).  Wash genitals (private parts)       with your normal soap.  6.  Wash thoroughly, paying special attention to the area where your surgery        will be performed.  7.  Thoroughly rinse your body with warm water from the neck down.  8.  DO NOT shower/wash with your normal soap after using and rinsing off       the CHG Soap.  9.  Pat yourself dry with a clean towel.            10.  Wear clean pajamas.            11.  Place clean sheets on your bed the night of your first shower and do not        sleep with pets.  Day of  Surgery  Do not  apply any lotions/deoderants the morning of surgery.  Please wear clean clothes to the hospital/surgery center.    Please read over the following fact sheets that you were given. Pain Booklet, Coughing and Deep Breathing, MRSA Information and Surgical Site Infection Prevention

## 2016-07-12 NOTE — Progress Notes (Signed)
Cardiologist denies  Medical MD is Dr.Donna Inda Merlin  Echo/stress test/heart cath denies  Denies EKG/CXR in past yr

## 2016-07-13 LAB — URINE CULTURE: Culture: NO GROWTH

## 2016-07-13 LAB — HEMOGLOBIN A1C
HEMOGLOBIN A1C: 5.8 % — AB (ref 4.8–5.6)
MEAN PLASMA GLUCOSE: 120 mg/dL

## 2016-07-22 MED ORDER — TRANEXAMIC ACID 1000 MG/10ML IV SOLN
1000.0000 mg | INTRAVENOUS | Status: AC
  Administered 2016-07-23: 1000 mg via INTRAVENOUS
  Filled 2016-07-22: qty 10

## 2016-07-22 MED ORDER — CEFAZOLIN SODIUM-DEXTROSE 2-4 GM/100ML-% IV SOLN
2.0000 g | INTRAVENOUS | Status: AC
  Administered 2016-07-23: 2 g via INTRAVENOUS
  Filled 2016-07-22: qty 100

## 2016-07-22 MED ORDER — SODIUM CHLORIDE 0.9 % IV SOLN
INTRAVENOUS | Status: DC
  Administered 2016-07-23: 16:00:00 via INTRAVENOUS

## 2016-07-23 ENCOUNTER — Encounter (HOSPITAL_COMMUNITY): Payer: Self-pay | Admitting: *Deleted

## 2016-07-23 ENCOUNTER — Inpatient Hospital Stay (HOSPITAL_COMMUNITY): Payer: Medicare Other

## 2016-07-23 ENCOUNTER — Encounter (HOSPITAL_COMMUNITY)
Admission: RE | Disposition: A | Discharge status: Home Health | Payer: Self-pay | Source: Ambulatory Visit | Attending: Orthopedic Surgery

## 2016-07-23 ENCOUNTER — Inpatient Hospital Stay (HOSPITAL_COMMUNITY)
Admission: RE | Admit: 2016-07-23 | Discharge: 2016-07-25 | DRG: 470 | Disposition: A | Discharge status: Home Health | Payer: Medicare Other | Source: Ambulatory Visit | Attending: Orthopedic Surgery | Admitting: Orthopedic Surgery

## 2016-07-23 ENCOUNTER — Inpatient Hospital Stay (HOSPITAL_COMMUNITY): Payer: Medicare Other | Admitting: Certified Registered"

## 2016-07-23 DIAGNOSIS — Z885 Allergy status to narcotic agent status: Secondary | ICD-10-CM

## 2016-07-23 DIAGNOSIS — F419 Anxiety disorder, unspecified: Secondary | ICD-10-CM | POA: Diagnosis present

## 2016-07-23 DIAGNOSIS — E039 Hypothyroidism, unspecified: Secondary | ICD-10-CM | POA: Diagnosis present

## 2016-07-23 DIAGNOSIS — Z7984 Long term (current) use of oral hypoglycemic drugs: Secondary | ICD-10-CM

## 2016-07-23 DIAGNOSIS — E119 Type 2 diabetes mellitus without complications: Secondary | ICD-10-CM | POA: Diagnosis present

## 2016-07-23 DIAGNOSIS — I10 Essential (primary) hypertension: Secondary | ICD-10-CM | POA: Diagnosis present

## 2016-07-23 DIAGNOSIS — M1612 Unilateral primary osteoarthritis, left hip: Secondary | ICD-10-CM | POA: Diagnosis present

## 2016-07-23 DIAGNOSIS — Z981 Arthrodesis status: Secondary | ICD-10-CM | POA: Diagnosis not present

## 2016-07-23 DIAGNOSIS — Z79899 Other long term (current) drug therapy: Secondary | ICD-10-CM | POA: Diagnosis not present

## 2016-07-23 DIAGNOSIS — Z79891 Long term (current) use of opiate analgesic: Secondary | ICD-10-CM | POA: Diagnosis not present

## 2016-07-23 DIAGNOSIS — E785 Hyperlipidemia, unspecified: Secondary | ICD-10-CM | POA: Diagnosis present

## 2016-07-23 DIAGNOSIS — Z87891 Personal history of nicotine dependence: Secondary | ICD-10-CM | POA: Diagnosis not present

## 2016-07-23 DIAGNOSIS — Z791 Long term (current) use of non-steroidal anti-inflammatories (NSAID): Secondary | ICD-10-CM

## 2016-07-23 HISTORY — PX: TOTAL HIP ARTHROPLASTY: SHX124

## 2016-07-23 LAB — GLUCOSE, CAPILLARY
GLUCOSE-CAPILLARY: 114 mg/dL — AB (ref 65–99)
Glucose-Capillary: 111 mg/dL — ABNORMAL HIGH (ref 65–99)
Glucose-Capillary: 133 mg/dL — ABNORMAL HIGH (ref 65–99)

## 2016-07-23 SURGERY — ARTHROPLASTY, HIP, TOTAL,POSTERIOR APPROACH
Anesthesia: Spinal | Laterality: Left

## 2016-07-23 MED ORDER — BUPIVACAINE-EPINEPHRINE (PF) 0.25% -1:200000 IJ SOLN
INTRAMUSCULAR | Status: AC
  Filled 2016-07-23: qty 60

## 2016-07-23 MED ORDER — TRANEXAMIC ACID 1000 MG/10ML IV SOLN
2000.0000 mg | INTRAVENOUS | Status: AC
  Administered 2016-07-23: 2000 mg via TOPICAL
  Filled 2016-07-23: qty 20

## 2016-07-23 MED ORDER — ALPRAZOLAM 0.5 MG PO TABS
0.5000 mg | ORAL_TABLET | Freq: Every evening | ORAL | Status: DC | PRN
  Filled 2016-07-23: qty 1

## 2016-07-23 MED ORDER — PROMETHAZINE HCL 25 MG/ML IJ SOLN: 6.2500 mg | INTRAMUSCULAR | Status: DC | PRN

## 2016-07-23 MED ORDER — MEPERIDINE HCL 25 MG/ML IJ SOLN: 6.2500 mg | INTRAMUSCULAR | Status: DC | PRN

## 2016-07-23 MED ORDER — MIDAZOLAM HCL 2 MG/2ML IJ SOLN
INTRAMUSCULAR | Status: AC
  Filled 2016-07-23: qty 2

## 2016-07-23 MED ORDER — LEVOTHYROXINE SODIUM 112 MCG PO TABS
112.0000 ug | ORAL_TABLET | Freq: Every day | ORAL | Status: DC
  Administered 2016-07-24 – 2016-07-25 (×2): 112 ug via ORAL
  Filled 2016-07-23 (×2): qty 1

## 2016-07-23 MED ORDER — BISACODYL 5 MG PO TBEC: 5.0000 mg | DELAYED_RELEASE_TABLET | Freq: Every day | ORAL | Status: DC | PRN

## 2016-07-23 MED ORDER — ACETAMINOPHEN 650 MG RE SUPP: 650.0000 mg | Freq: Four times a day (QID) | RECTAL | Status: DC | PRN

## 2016-07-23 MED ORDER — BUPIVACAINE-EPINEPHRINE 0.5% -1:200000 IJ SOLN
INTRAMUSCULAR | Status: DC | PRN
  Administered 2016-07-23: 20 mL
  Administered 2016-07-23: 30 mL

## 2016-07-23 MED ORDER — INSULIN ASPART 100 UNIT/ML ~~LOC~~ SOLN
0.0000 [IU] | Freq: Three times a day (TID) | SUBCUTANEOUS | Status: DC
  Administered 2016-07-25: 2 [IU] via SUBCUTANEOUS

## 2016-07-23 MED ORDER — VALSARTAN-HYDROCHLOROTHIAZIDE 320-25 MG PO TABS: 1.0000 | ORAL_TABLET | Freq: Every day | ORAL | Status: DC

## 2016-07-23 MED ORDER — PHENYLEPHRINE HCL 10 MG/ML IJ SOLN
INTRAVENOUS | Status: DC | PRN
  Administered 2016-07-23: 20 ug/min via INTRAVENOUS

## 2016-07-23 MED ORDER — VALACYCLOVIR HCL 500 MG PO TABS
500.0000 mg | ORAL_TABLET | Freq: Every day | ORAL | Status: DC
  Administered 2016-07-24 – 2016-07-25 (×2): 500 mg via ORAL
  Filled 2016-07-23 (×2): qty 1

## 2016-07-23 MED ORDER — OXYCODONE HCL 5 MG PO TABS
20.0000 mg | ORAL_TABLET | Freq: Four times a day (QID) | ORAL | Status: DC | PRN
  Administered 2016-07-23 – 2016-07-24 (×4): 20 mg via ORAL
  Filled 2016-07-23 (×4): qty 4

## 2016-07-23 MED ORDER — HYDROMORPHONE HCL 2 MG/ML IJ SOLN
2.0000 mg | INTRAMUSCULAR | Status: DC | PRN
  Administered 2016-07-23 – 2016-07-24 (×8): 2 mg via INTRAVENOUS
  Filled 2016-07-23 (×8): qty 1

## 2016-07-23 MED ORDER — PROPOFOL 10 MG/ML IV BOLUS
INTRAVENOUS | Status: DC | PRN
  Administered 2016-07-23: 20 mg via INTRAVENOUS

## 2016-07-23 MED ORDER — INSULIN ASPART 100 UNIT/ML ~~LOC~~ SOLN: 0.0000 [IU] | Freq: Every day | SUBCUTANEOUS | Status: DC

## 2016-07-23 MED ORDER — LACTATED RINGERS IV SOLN
Freq: Once | INTRAVENOUS | Status: AC
  Administered 2016-07-23: 09:00:00 via INTRAVENOUS

## 2016-07-23 MED ORDER — AMLODIPINE BESYLATE 5 MG PO TABS
5.0000 mg | ORAL_TABLET | Freq: Two times a day (BID) | ORAL | Status: DC
  Administered 2016-07-23 – 2016-07-25 (×4): 5 mg via ORAL
  Filled 2016-07-23 (×4): qty 1

## 2016-07-23 MED ORDER — ALUM & MAG HYDROXIDE-SIMETH 200-200-20 MG/5ML PO SUSP: 30.0000 mL | ORAL | Status: DC | PRN

## 2016-07-23 MED ORDER — FENTANYL CITRATE (PF) 100 MCG/2ML IJ SOLN
INTRAMUSCULAR | Status: AC
  Filled 2016-07-23: qty 2

## 2016-07-23 MED ORDER — PRAVASTATIN SODIUM 20 MG PO TABS
20.0000 mg | ORAL_TABLET | Freq: Every day | ORAL | Status: DC
  Administered 2016-07-23 – 2016-07-24 (×2): 20 mg via ORAL
  Filled 2016-07-23 (×2): qty 1

## 2016-07-23 MED ORDER — HYDROMORPHONE HCL 1 MG/ML IJ SOLN
0.2500 mg | INTRAMUSCULAR | Status: DC | PRN
Start: 2016-07-23 — End: 2016-07-23

## 2016-07-23 MED ORDER — METOCLOPRAMIDE HCL 5 MG/ML IJ SOLN: 5.0000 mg | Freq: Three times a day (TID) | INTRAMUSCULAR | Status: DC | PRN

## 2016-07-23 MED ORDER — PHENYLEPHRINE HCL 10 MG/ML IJ SOLN
INTRAMUSCULAR | Status: AC
  Filled 2016-07-23: qty 1

## 2016-07-23 MED ORDER — IRBESARTAN 300 MG PO TABS
300.0000 mg | ORAL_TABLET | Freq: Every day | ORAL | Status: DC
  Administered 2016-07-24 – 2016-07-25 (×2): 300 mg via ORAL
  Filled 2016-07-23 (×2): qty 1

## 2016-07-23 MED ORDER — APIXABAN 2.5 MG PO TABS
2.5000 mg | ORAL_TABLET | Freq: Two times a day (BID) | ORAL | Status: DC
  Administered 2016-07-24 – 2016-07-25 (×3): 2.5 mg via ORAL
  Filled 2016-07-23 (×3): qty 1

## 2016-07-23 MED ORDER — 0.9 % SODIUM CHLORIDE (POUR BTL) OPTIME
TOPICAL | Status: DC | PRN
  Administered 2016-07-23 (×2): 1000 mL

## 2016-07-23 MED ORDER — PHENOL 1.4 % MT LIQD: 1.0000 | OROMUCOSAL | Status: DC | PRN

## 2016-07-23 MED ORDER — FENTANYL CITRATE (PF) 100 MCG/2ML IJ SOLN
INTRAMUSCULAR | Status: DC | PRN
  Administered 2016-07-23: 50 ug via INTRAVENOUS

## 2016-07-23 MED ORDER — PROPOFOL 500 MG/50ML IV EMUL
INTRAVENOUS | Status: DC | PRN
  Administered 2016-07-23: 75 ug/kg/min via INTRAVENOUS
  Administered 2016-07-23: 50 ug/kg/min via INTRAVENOUS

## 2016-07-23 MED ORDER — LACTATED RINGERS IV SOLN: INTRAVENOUS | Status: DC

## 2016-07-23 MED ORDER — METOCLOPRAMIDE HCL 5 MG PO TABS: 5.0000 mg | ORAL_TABLET | Freq: Three times a day (TID) | ORAL | Status: DC | PRN

## 2016-07-23 MED ORDER — ACETAMINOPHEN 325 MG PO TABS
650.0000 mg | ORAL_TABLET | Freq: Four times a day (QID) | ORAL | Status: DC | PRN
  Administered 2016-07-23: 650 mg via ORAL
  Filled 2016-07-23: qty 2

## 2016-07-23 MED ORDER — ADULT MULTIVITAMIN W/MINERALS CH
1.0000 | ORAL_TABLET | Freq: Every day | ORAL | Status: DC
  Administered 2016-07-24 – 2016-07-25 (×2): 1 via ORAL
  Filled 2016-07-23 (×2): qty 1

## 2016-07-23 MED ORDER — EPHEDRINE 5 MG/ML INJ
INTRAVENOUS | Status: AC
  Filled 2016-07-23: qty 10

## 2016-07-23 MED ORDER — MENTHOL 3 MG MT LOZG: 1.0000 | LOZENGE | OROMUCOSAL | Status: DC | PRN

## 2016-07-23 MED ORDER — SODIUM CHLORIDE 0.9 % IJ SOLN
INTRAMUSCULAR | Status: DC | PRN
  Administered 2016-07-23 (×2): 10 mL

## 2016-07-23 MED ORDER — BUPIVACAINE LIPOSOME 1.3 % IJ SUSP
20.0000 mL | INTRAMUSCULAR | Status: AC
  Administered 2016-07-23: 20 mL
  Filled 2016-07-23: qty 20

## 2016-07-23 MED ORDER — POLYETHYLENE GLYCOL 3350 17 G PO PACK: 17.0000 g | PACK | Freq: Every day | ORAL | Status: DC | PRN

## 2016-07-23 MED ORDER — HYDROCHLOROTHIAZIDE 25 MG PO TABS
25.0000 mg | ORAL_TABLET | Freq: Every day | ORAL | Status: DC
  Administered 2016-07-24 – 2016-07-25 (×2): 25 mg via ORAL
  Filled 2016-07-23 (×2): qty 1

## 2016-07-23 MED ORDER — ONDANSETRON HCL 4 MG/2ML IJ SOLN: 4.0000 mg | Freq: Four times a day (QID) | INTRAMUSCULAR | Status: DC | PRN

## 2016-07-23 MED ORDER — MIDAZOLAM HCL 5 MG/5ML IJ SOLN
INTRAMUSCULAR | Status: DC | PRN
  Administered 2016-07-23: 2 mg via INTRAVENOUS

## 2016-07-23 MED ORDER — LACTATED RINGERS IV SOLN
INTRAVENOUS | Status: DC | PRN
  Administered 2016-07-23 (×2): via INTRAVENOUS

## 2016-07-23 MED ORDER — INSULIN ASPART 100 UNIT/ML ~~LOC~~ SOLN
4.0000 [IU] | Freq: Three times a day (TID) | SUBCUTANEOUS | Status: DC
  Administered 2016-07-24 – 2016-07-25 (×2): 4 [IU] via SUBCUTANEOUS

## 2016-07-23 MED ORDER — FLEET ENEMA 7-19 GM/118ML RE ENEM
1.0000 | ENEMA | Freq: Once | RECTAL | Status: DC | PRN
Start: 2016-07-23 — End: 2016-07-25

## 2016-07-23 MED ORDER — CEFAZOLIN SODIUM-DEXTROSE 2-4 GM/100ML-% IV SOLN
2.0000 g | Freq: Four times a day (QID) | INTRAVENOUS | Status: AC
  Administered 2016-07-23: 2 g via INTRAVENOUS
  Filled 2016-07-23 (×2): qty 100

## 2016-07-23 MED ORDER — TRANEXAMIC ACID 1000 MG/10ML IV SOLN
1000.0000 mg | Freq: Once | INTRAVENOUS | Status: DC
  Filled 2016-07-23: qty 10

## 2016-07-23 MED ORDER — EPHEDRINE SULFATE-NACL 50-0.9 MG/10ML-% IV SOSY
PREFILLED_SYRINGE | INTRAVENOUS | Status: DC | PRN
  Administered 2016-07-23 (×2): 10 mg via INTRAVENOUS

## 2016-07-23 MED ORDER — CELECOXIB 200 MG PO CAPS
200.0000 mg | ORAL_CAPSULE | Freq: Two times a day (BID) | ORAL | Status: DC
  Administered 2016-07-23 – 2016-07-25 (×4): 200 mg via ORAL
  Filled 2016-07-23 (×4): qty 1

## 2016-07-23 MED ORDER — DOCUSATE SODIUM 100 MG PO CAPS
100.0000 mg | ORAL_CAPSULE | Freq: Two times a day (BID) | ORAL | Status: DC
  Administered 2016-07-23 – 2016-07-25 (×4): 100 mg via ORAL
  Filled 2016-07-23 (×4): qty 1

## 2016-07-23 MED ORDER — METHOCARBAMOL 500 MG PO TABS
500.0000 mg | ORAL_TABLET | Freq: Four times a day (QID) | ORAL | Status: DC | PRN
  Administered 2016-07-23 – 2016-07-25 (×6): 500 mg via ORAL
  Filled 2016-07-23 (×6): qty 1

## 2016-07-23 MED ORDER — VITAMIN D 1000 UNITS PO TABS
1000.0000 [IU] | ORAL_TABLET | Freq: Every day | ORAL | Status: DC
  Administered 2016-07-24 – 2016-07-25 (×2): 1000 [IU] via ORAL
  Filled 2016-07-23 (×2): qty 1

## 2016-07-23 MED ORDER — ONDANSETRON HCL 4 MG PO TABS: 4.0000 mg | ORAL_TABLET | Freq: Four times a day (QID) | ORAL | Status: DC | PRN

## 2016-07-23 MED ORDER — METHOCARBAMOL 1000 MG/10ML IJ SOLN
500.0000 mg | Freq: Four times a day (QID) | INTRAMUSCULAR | Status: DC | PRN
  Filled 2016-07-23: qty 5

## 2016-07-23 MED ORDER — SODIUM CHLORIDE 0.9 % IV SOLN
INTRAVENOUS | Status: DC
  Administered 2016-07-23: 16:00:00 via INTRAVENOUS

## 2016-07-23 SURGICAL SUPPLY — 56 items
BLADE SAW SAG 73X25 THK (BLADE) ×1
BLADE SAW SGTL 73X25 THK (BLADE) ×1 IMPLANT
BRUSH FEMORAL CANAL (MISCELLANEOUS) IMPLANT
CAPT HIP TOTAL 2 ×2 IMPLANT
COVER SURGICAL LIGHT HANDLE (MISCELLANEOUS) ×2 IMPLANT
DRAPE INCISE IOBAN 66X45 STRL (DRAPES) IMPLANT
DRAPE ORTHO SPLIT 77X108 STRL (DRAPES) ×2
DRAPE SURG ORHT 6 SPLT 77X108 (DRAPES) ×2 IMPLANT
DRAPE U-SHAPE 47X51 STRL (DRAPES) ×2 IMPLANT
DRSG ADAPTIC 3X8 NADH LF (GAUZE/BANDAGES/DRESSINGS) ×2 IMPLANT
DRSG PAD ABDOMINAL 8X10 ST (GAUZE/BANDAGES/DRESSINGS) ×2 IMPLANT
DURAPREP 26ML APPLICATOR (WOUND CARE) ×2 IMPLANT
ELECT BLADE 6.5 EXT (BLADE) ×2 IMPLANT
ELECT CAUTERY BLADE 6.4 (BLADE) ×2 IMPLANT
ELECT REM PT RETURN 9FT ADLT (ELECTROSURGICAL) ×2
ELECTRODE REM PT RTRN 9FT ADLT (ELECTROSURGICAL) ×1 IMPLANT
FACESHIELD WRAPAROUND (MASK) ×4 IMPLANT
GAUZE SPONGE 4X4 12PLY STRL (GAUZE/BANDAGES/DRESSINGS) ×2 IMPLANT
GLOVE BIOGEL PI IND STRL 8 (GLOVE) ×2 IMPLANT
GLOVE BIOGEL PI INDICATOR 8 (GLOVE) ×2
GLOVE ORTHO TXT STRL SZ7.5 (GLOVE) ×4 IMPLANT
GLOVE SURG ORTHO 8.0 STRL STRW (GLOVE) ×4 IMPLANT
GOWN STRL REUS W/ TWL LRG LVL3 (GOWN DISPOSABLE) ×1 IMPLANT
GOWN STRL REUS W/ TWL XL LVL3 (GOWN DISPOSABLE) ×1 IMPLANT
GOWN STRL REUS W/TWL 2XL LVL3 (GOWN DISPOSABLE) ×2 IMPLANT
GOWN STRL REUS W/TWL LRG LVL3 (GOWN DISPOSABLE) ×1
GOWN STRL REUS W/TWL XL LVL3 (GOWN DISPOSABLE) ×1
HANDPIECE INTERPULSE COAX TIP (DISPOSABLE)
HOOD PEEL AWAY FACE SHEILD DIS (HOOD) ×2 IMPLANT
IMMOBILIZER KNEE 22 UNIV (SOFTGOODS) ×2 IMPLANT
KIT BASIN OR (CUSTOM PROCEDURE TRAY) ×2 IMPLANT
KIT ROOM TURNOVER OR (KITS) ×2 IMPLANT
MANIFOLD NEPTUNE II (INSTRUMENTS) ×2 IMPLANT
NEEDLE 22X1 1/2 (OR ONLY) (NEEDLE) ×2 IMPLANT
NEEDLE MAYO TROCAR (NEEDLE) IMPLANT
NS IRRIG 1000ML POUR BTL (IV SOLUTION) ×4 IMPLANT
PACK TOTAL JOINT (CUSTOM PROCEDURE TRAY) ×2 IMPLANT
PACK UNIVERSAL I (CUSTOM PROCEDURE TRAY) ×2 IMPLANT
PAD ARMBOARD 7.5X6 YLW CONV (MISCELLANEOUS) ×4 IMPLANT
PRESSURIZER FEMORAL UNIV (MISCELLANEOUS) IMPLANT
SET HNDPC FAN SPRY TIP SCT (DISPOSABLE) IMPLANT
SPONGE GAUZE 4X4 12PLY STER LF (GAUZE/BANDAGES/DRESSINGS) ×2 IMPLANT
STAPLER VISISTAT 35W (STAPLE) ×2 IMPLANT
SUCTION FRAZIER HANDLE 10FR (MISCELLANEOUS) ×1
SUCTION TUBE FRAZIER 10FR DISP (MISCELLANEOUS) ×1 IMPLANT
SUT ETHIBOND 2 V 37 (SUTURE) ×2 IMPLANT
SUT VIC AB 0 CT1 27 (SUTURE) ×1
SUT VIC AB 0 CT1 27XBRD ANBCTR (SUTURE) ×1 IMPLANT
SUT VIC AB 2-0 CT1 27 (SUTURE) ×2
SUT VIC AB 2-0 CT1 TAPERPNT 27 (SUTURE) ×2 IMPLANT
SYR CONTROL 10ML LL (SYRINGE) ×2 IMPLANT
TOWEL OR 17X24 6PK STRL BLUE (TOWEL DISPOSABLE) ×2 IMPLANT
TOWEL OR 17X26 10 PK STRL BLUE (TOWEL DISPOSABLE) ×2 IMPLANT
TOWER CARTRIDGE SMART MIX (DISPOSABLE) IMPLANT
TRAY CATH 16FR W/PLASTIC CATH (SET/KITS/TRAYS/PACK) IMPLANT
WATER STERILE IRR 1000ML POUR (IV SOLUTION) IMPLANT

## 2016-07-23 NOTE — H&P (View-Only) (Signed)
TOTAL HIP ADMISSION H&P  Patient is admitted for left total hip arthroplasty.  Subjective:  Chief Complaint: left hip pain  HPI: Norman Hudson, 65 y.o. male, has a history of pain and functional disability in the left hip(s) due to arthritis and patient has failed non-surgical conservative treatments for greater than 12 weeks to include NSAID's and/or analgesics, corticosteriod injections and activity modification.  Onset of symptoms was gradual starting 5 years ago with gradually worsening course since that time.The patient noted no past surgery on the left hip(s).  Patient currently rates pain in the left hip at 8 out of 10 with activity. Patient has night pain, worsening of pain with activity and weight bearing and pain that interfers with activities of daily living. Patient has evidence of periarticular osteophytes and joint space narrowing by imaging studies. This condition presents safety issues increasing the risk of falls. There is no current active infection.  Patient Active Problem List   Diagnosis Date Noted  . S/P lumbar laminectomy 07/07/2015   Past Medical History:  Diagnosis Date  . Arthritis   . Diabetes mellitus without complication (HCC)    borderline  . Hypertension   . Hypothyroidism     Past Surgical History:  Procedure Laterality Date  . COLONOSCOPY    . LUMBAR LAMINECTOMY WITH COFLEX 1 LEVEL N/A 07/07/2015   Procedure: LUMBAR FOUR-FIVE LUMBAR LAMINECTOMY WITH COFLEX;  Surgeon: Eustace Moore, MD;  Location: [facility];  Service: Neurosurgery;  Laterality: N/A;  . TONSILLECTOMY       (Not in a hospital admission) Allergies  Allergen Reactions  . Tramadol Itching    rash    Social History  Substance Use Topics  . Smoking status: Never Smoker  . Smokeless tobacco: Not on file  . Alcohol use Yes     Comment: occ    No family history on file.   Review of Systems  Musculoskeletal: Positive for joint pain.  All other systems reviewed and are  negative.   Objective:  Physical Exam  Constitutional: He is oriented to person, place, and time. He appears well-developed and well-nourished. No distress.  HENT:  Head: Normocephalic and atraumatic.  Nose: Nose normal.  Eyes: Conjunctivae and EOM are normal. Pupils are equal, round, and reactive to light.  Neck: Normal range of motion. Neck supple.  Cardiovascular: Normal rate, regular rhythm, normal heart sounds and intact distal pulses.   Respiratory: Effort normal and breath sounds normal. No respiratory distress. He has no wheezes.  GI: Soft. Bowel sounds are normal. He exhibits no distension. There is no tenderness.  Musculoskeletal:       Left hip: He exhibits decreased range of motion, tenderness and bony tenderness.  Lymphadenopathy:    He has no cervical adenopathy.  Neurological: He is alert and oriented to person, place, and time. No cranial nerve deficit.  Skin: Skin is warm and dry. No rash noted. No erythema.  Psychiatric: He has a normal mood and affect. His behavior is normal.    Vital signs in last 24 hours: @VSRANGES @  Labs:   Estimated body mass index is 28.19 kg/m as calculated from the following:   Height as of 10/13/15: 5\' 10"  (1.778 m).   Weight as of 10/13/15: 89.1 kg (196 lb 8 oz).   Imaging Review Plain radiographs demonstrate moderate degenerative joint disease of the left hip(s). The bone quality appears to be good for age and reported activity level.  Assessment/Plan:  End stage arthritis, left hip(s)  The patient history, physical examination, clinical judgement of the provider and imaging studies are consistent with end stage degenerative joint disease of the left hip(s) and total hip arthroplasty is deemed medically necessary. The treatment options including medical management, injection therapy, arthroscopy and arthroplasty were discussed at length. The risks and benefits of total hip arthroplasty were presented and reviewed. The risks due to  aseptic loosening, infection, stiffness, dislocation/subluxation,  thromboembolic complications and other imponderables were discussed.  The patient acknowledged the explanation, agreed to proceed with the plan and consent was signed. Patient is being admitted for inpatient treatment for surgery, pain control, PT, OT, prophylactic antibiotics, VTE prophylaxis, progressive ambulation and ADL's and discharge planning.The patient is planning to be discharged home with home health services

## 2016-07-23 NOTE — Anesthesia Postprocedure Evaluation (Signed)
Anesthesia Post Note  Patient: Norman Hudson  Procedure(s) Performed: Procedure(s) (LRB): TOTAL HIP ARTHROPLASTY (Left)  Patient location during evaluation: PACU Anesthesia Type: Spinal Level of consciousness: oriented and awake and alert Pain management: pain level controlled Vital Signs Assessment: post-procedure vital signs reviewed and stable Respiratory status: spontaneous breathing, respiratory function stable and patient connected to nasal cannula oxygen Cardiovascular status: blood pressure returned to baseline and stable Postop Assessment: no headache, no backache and spinal receding Anesthetic complications: no    Last Vitals:  Vitals:   07/23/16 1430 07/23/16 1635  BP: 120/72 129/70  Pulse: 78 84  Resp: 16 16  Temp: 36.6 C 36.6 C    Last Pain:  Vitals:   07/23/16 1635  TempSrc: Oral  PainSc:                  Effie Berkshire

## 2016-07-23 NOTE — Anesthesia Preprocedure Evaluation (Signed)
Anesthesia Evaluation  Patient identified by MRN, date of birth, ID band Patient awake    Reviewed: Allergy & Precautions, NPO status , Patient's Chart, lab work & pertinent test results  Airway Mallampati: I  TM Distance: >3 FB Neck ROM: Full    Dental  (+) Teeth Intact, Dental Advisory Given   Pulmonary former smoker,    breath sounds clear to auscultation       Cardiovascular hypertension, Pt. on medications  Rhythm:Regular Rate:Normal     Neuro/Psych PSYCHIATRIC DISORDERS Anxiety negative neurological ROS     GI/Hepatic negative GI ROS, Neg liver ROS,   Endo/Other  diabetes, Type 2, Oral Hypoglycemic AgentsHypothyroidism   Renal/GU negative Renal ROS  negative genitourinary   Musculoskeletal  (+) Arthritis , Osteoarthritis,    Abdominal   Peds negative pediatric ROS (+)  Hematology negative hematology ROS (+)   Anesthesia Other Findings   Reproductive/Obstetrics negative OB ROS                             Lab Results  Component Value Date   WBC 9.5 07/12/2016   HGB 16.5 07/12/2016   HCT 49.0 07/12/2016   MCV 93.5 07/12/2016   PLT 170 07/12/2016   Lab Results  Component Value Date   CREATININE 0.94 07/12/2016   BUN 17 07/12/2016   NA 139 07/12/2016   K 3.9 07/12/2016   CL 106 07/12/2016   CO2 23 07/12/2016   Lab Results  Component Value Date   INR 1.14 07/12/2016   INR 1.13 06/27/2015   EKG: normal sinus rhythm.  Anesthesia Physical Anesthesia Plan  ASA: II  Anesthesia Plan: Spinal   Post-op Pain Management:    Induction: Intravenous  Airway Management Planned: Natural Airway  Additional Equipment:   Intra-op Plan:   Post-operative Plan:   Informed Consent: I have reviewed the patients History and Physical, chart, labs and discussed the procedure including the risks, benefits and alternatives for the proposed anesthesia with the patient or authorized  representative who has indicated his/her understanding and acceptance.     Plan Discussed with: CRNA  Anesthesia Plan Comments:         Anesthesia Quick Evaluation

## 2016-07-23 NOTE — Transfer of Care (Signed)
Immediate Anesthesia Transfer of Care Note  Patient: Norman Hudson  Procedure(s) Performed: Procedure(s): TOTAL HIP ARTHROPLASTY (Left)  Patient Location: PACU  Anesthesia Type:Spinal and MAC combined with regional for post-op pain  Level of Consciousness: awake, oriented and patient cooperative  Airway & Oxygen Therapy: Patient Spontanous Breathing  Post-op Assessment: Report given to RN and Post -op Vital signs reviewed and stable  Post vital signs: Reviewed and stable  Last Vitals:  Vitals:   07/23/16 0848  BP: 129/78  Pulse: 72  Resp: 18  Temp: 36.9 C    Last Pain:  Vitals:   07/23/16 0858  TempSrc:   PainSc: 0-No pain         Complications: No apparent anesthesia complications

## 2016-07-23 NOTE — Interval H&P Note (Signed)
History and Physical Interval Note:  07/23/2016 9:30 AM  Norman Hudson  has presented today for surgery, with the diagnosis of OA LEFT HIP  The various methods of treatment have been discussed with the patient and family. After consideration of risks, benefits and other options for treatment, the patient has consented to  Procedure(s): TOTAL HIP ARTHROPLASTY (Left) as a surgical intervention .  The patient's history has been reviewed, patient examined, no change in status, stable for surgery.  I have reviewed the patient's chart and labs.  Questions were answered to the patient's satisfaction.     Kiyaan Haq JR,W D

## 2016-07-23 NOTE — Care Management Note (Signed)
Case Management Note  Patient Details  Name: Norman Hudson MRN: [id number] Date of Birth: 11-10-1950  Subjective/Objective:  65 yr old gentleman s/p left total hip arthroplasty.                   Action/Plan: Case manager spoke with patient concerning [place] and DME needs. Patient was preoperatively setup with [facility], no changes.    Expected Discharge Date:   07/25/16               Expected Discharge Plan:  [place]  In-House Referral:  NA  Discharge planning Services  CM Consult  Post Acute Care Choice:  Durable Medical Equipment, Home Health Choice offered to:  Patient  DME Arranged:  3-N-1, Walker rolling DME Agency:  [place]:  PT [place] Agency:  [facility] (formerly [facility])  Status of Service:  In process, will continue to follow  If discussed at Long Length of Stay Meetings, dates discussed:    Additional Comments:  Ninfa Meeker, RN 07/23/2016, 4:18 PM

## 2016-07-23 NOTE — Brief Op Note (Signed)
07/23/2016  12:31 PM  PATIENT:  Norman Hudson  65 y.o. male  PRE-OPERATIVE DIAGNOSIS:  OA LEFT HIP  POST-OPERATIVE DIAGNOSIS:  OA LEFT HIP  PROCEDURE:  Procedure(s): TOTAL HIP ARTHROPLASTY (Left)  SURGEON:  Surgeon(s) and Role:    * Earlie Server, MD - Primary  PHYSICIAN ASSISTANT: Chriss Czar, PA-C  ASSISTANTS: OR staff x1   ANESTHESIA:   local and spinal  EBL:  Total I/O In: 1750 [I.V.:1750] Out: 400 [Blood:400]  BLOOD ADMINISTERED:none  DRAINS: none   LOCAL MEDICATIONS USED:  MARCAINE     SPECIMEN:  No Specimen  DISPOSITION OF SPECIMEN:  N/A  COUNTS:  YES  TOURNIQUET:  * No tourniquets in log *  DICTATION: .Other Dictation: Dictation Number unknown  PLAN OF CARE: Admit to inpatient   PATIENT DISPOSITION:  PACU - hemodynamically stable.   Delay start of Pharmacological VTE agent (>24hrs) due to surgical blood loss or risk of bleeding: yes

## 2016-07-24 LAB — CBC
HCT: 41.7 % (ref 39.0–52.0)
Hemoglobin: 13.8 g/dL (ref 13.0–17.0)
MCH: 31.2 pg (ref 26.0–34.0)
MCHC: 33.1 g/dL (ref 30.0–36.0)
MCV: 94.1 fL (ref 78.0–100.0)
PLATELETS: 162 10*3/uL (ref 150–400)
RBC: 4.43 MIL/uL (ref 4.22–5.81)
RDW: 13.6 % (ref 11.5–15.5)
WBC: 11.2 10*3/uL — ABNORMAL HIGH (ref 4.0–10.5)

## 2016-07-24 LAB — BASIC METABOLIC PANEL
Anion gap: 9 (ref 5–15)
BUN: 18 mg/dL (ref 6–20)
CO2: 27 mmol/L (ref 22–32)
CREATININE: 1.17 mg/dL (ref 0.61–1.24)
Calcium: 8.4 mg/dL — ABNORMAL LOW (ref 8.9–10.3)
Chloride: 101 mmol/L (ref 101–111)
GFR calc Af Amer: >60 mL/min (ref >60)
Glucose, Bld: 119 mg/dL — ABNORMAL HIGH (ref 65–99)
POTASSIUM: 4.2 mmol/L (ref 3.5–5.1)
SODIUM: 137 mmol/L (ref 135–145)

## 2016-07-24 LAB — GLUCOSE, CAPILLARY
GLUCOSE-CAPILLARY: 133 mg/dL — AB (ref 65–99)
Glucose-Capillary: 158 mg/dL — ABNORMAL HIGH (ref 65–99)
Glucose-Capillary: 117 mg/dL — ABNORMAL HIGH (ref 65–99)
Glucose-Capillary: 142 mg/dL — ABNORMAL HIGH (ref 65–99)

## 2016-07-24 MED ORDER — OXYCODONE-ACETAMINOPHEN 10-325 MG PO TABS: ORAL_TABLET | ORAL | 0 refills | Status: DC

## 2016-07-24 MED ORDER — OXYCODONE HCL 5 MG PO TABS
10.0000 mg | ORAL_TABLET | ORAL | Status: DC | PRN
  Administered 2016-07-24 – 2016-07-25 (×4): 10 mg via ORAL
  Filled 2016-07-24 (×4): qty 2

## 2016-07-24 NOTE — Progress Notes (Signed)
Physical Therapy Treatment Patient Details Name: Norman Hudson MRN: [id number] DOB: 01/24/1951 Today's Date: 07/24/2016    History of Present Illness S/P THA on 07/24/2016.  Has had lower back surgery in the past. Had posterior aprroach THA. PMH :  DMII HTN, anxiety, Joint pain Lumbar surgery 2016. Has had pain control issue with the lower back surgery.     PT Comments    Patient increased gait distance in the afternoon. He is still hesitant to weight bear on the left leg despite cuing. He has 2 steps into his house. The PA would like him seen in the morning for likely D/C in the afternoon if appropriate. Continue to educate the patient on precautions. He was only able to recall 2/3 in the afternoon and less with OT when  He was medicated in the afternoon.   Follow Up Recommendations  Home health PT     Equipment Recommendations       Recommendations for Other Services       Precautions / Restrictions Precautions Precautions: Posterior Hip Precaution Booklet Issued: Yes (comment) Precaution Comments: Pt able to recall 2/3 precautions, reviwed again with Pt Restrictions Weight Bearing Restrictions: Yes Other Position/Activity Restrictions: WBAT    Mobility  Bed Mobility Overal bed mobility: Needs Assistance Bed Mobility: Supine to Sit     Supine to sit: Min assist     General bed mobility comments: still required min a to the edge of the bed but less assist required and less pain noted.   Transfers Overall transfer level: Needs assistance Equipment used: Rolling walker (2 wheeled) Transfers: Sit to/from Stand Sit to Stand: Mod assist         General transfer comment: mod A with vc and assist to bring hips to edge of recliner to maintain precautions  Ambulation/Gait Ambulation/Gait assistance: Independent Ambulation Distance (Feet): 100 Feet Assistive device: Rolling walker (2 wheeled) Gait Pattern/deviations: Step-to pattern   Gait velocity  interpretation: <1.8 ft/sec, indicative of risk for recurrent falls General Gait Details: Slow step to gait pattern. Limited weight bearing on the left,. Weight bearing through his hands but improved. Will need his walker elevated 1 level.     Stairs            Wheelchair Mobility    Modified Rankin (Stroke Patients Only)       Balance Overall balance assessment: Needs assistance Sitting-balance support: No upper extremity supported;Feet supported Sitting balance-Leahy Scale: Fair Sitting balance - Comments: Hesitant to put weight on left side in siitting    Standing balance support: Bilateral upper extremity supported Standing balance-Leahy Scale: Poor Standing balance comment: reliant on RW and therapist                    Cognition Arousal/Alertness: Awake/alert Behavior During Therapy: WFL for tasks assessed/performed Overall Cognitive Status: Within Functional Limits for tasks assessed                 General Comments: improved anxioety in the afternoon     Exercises      General Comments        Pertinent Vitals/Pain Pain Assessment: 0-10 Pain Score: 6  Pain Location: left hip Pain Descriptors / Indicators: Aching Pain Intervention(s): Limited activity within patient's tolerance;Monitored during session;Repositioned    Home Living Family/patient expects to be discharged to:: Private residence Living Arrangements: Spouse/significant other Available Help at Discharge: Family;Available 24 hours/day Type of Home: House Home Access: Stairs to enter   Home Layout: One  level Home Equipment: Cane - single point Additional Comments: Pt answered to the best of his abilities. Cognition suspect due to medication    Prior Function Level of Independence: Independent      Comments: independent per PT note. Pt impacted by medication and not able to answer questions coherently.   PT Goals (current goals can now be found in the care plan section)  Acute Rehab PT Goals Patient Stated Goal: To go home  PT Goal Formulation: With patient Time For Goal Achievement: 08/07/16 Potential to Achieve Goals: Good Progress towards PT goals: Progressing toward goals    Frequency    7X/week      PT Plan      Co-evaluation             End of Session Equipment Utilized During Treatment: Gait belt Activity Tolerance: Patient limited by pain;Patient tolerated treatment well Patient left: in chair;with call bell/phone within reach     Time: 0235-0259  PT Time Calculation (min) (ACUTE ONLY): 24 min  Charges:  $Gait Training: 8-22 mins $Therapeutic Activity: 8-22 mins                    G Codes:      Norman Hudson 2016-08-18, 5:05 PM

## 2016-07-24 NOTE — Progress Notes (Signed)
Physical Therapy Evaluation Patient Details Name: Norman Hudson MRN: [id number] DOB: 1951-01-23 Today's Date: 07/24/2016   History of Present Illness  S/P THA on 07/24/2016.  Has had lower back surgery in the past. Had posterior aprroach THA. PMH :  DMII HTN, anxiety, Joint pain Lumbar surgery 2016. Has had pain control issue with the lower back surgery.   Clinical Impression  Patient tolerated treatment well. He had some pain but was able to walk. He will require further education with his precautions. He presents with expected pain and weakness of the left leg. He would benefit from home health therapy with a progression to outpatient therapy if needed. Acute therapy will continue to work with the patient.     Follow Up Recommendations Home health PT    Equipment Recommendations       Recommendations for Other Services       Precautions / Restrictions Precautions Precautions: Posterior Hip Precaution Booklet Issued: Yes (comment) Precaution Comments: Given sheet with percuations will need follow up education  Restrictions Weight Bearing Restrictions: Yes Other Position/Activity Restrictions: WBAT      Mobility  Bed Mobility Overal bed mobility: Needs Assistance Bed Mobility: Supine to Sit     Supine to sit: Min assist     General bed mobility comments: Min a of left leg to the edge of the bed.   Transfers Overall transfer level: Needs assistance Equipment used: Rolling walker (2 wheeled) Transfers: Sit to/from Stand Sit to Stand: Min assist         General transfer comment: Min a to stand. Once standing Min Gaurd for balance   Ambulation/Gait Ambulation/Gait assistance: Independent Ambulation Distance (Feet): 15 Feet Assistive device: Rolling walker (2 wheeled) Gait Pattern/deviations: Step-to pattern   Gait velocity interpretation: <1.8 ft/sec, indicative of risk for recurrent falls General Gait Details: Slow step to gait pattern. Limited weight  bearing on the left,. Weight bearing through his hands   Stairs            Wheelchair Mobility    Modified Rankin (Stroke Patients Only)       Balance Overall balance assessment: Needs assistance Sitting-balance support: Single extremity supported Sitting balance-Leahy Scale: Fair Sitting balance - Comments: Hesitant to put weight on left side in siitting    Standing balance support: Bilateral upper extremity supported Standing balance-Leahy Scale: Poor                               Pertinent Vitals/Pain Pain Assessment: 0-10 Pain Score: 5  Pain Location: left hip Pain Descriptors / Indicators: Aching Pain Intervention(s): Premedicated before session;Monitored during session;Patient requesting pain meds-RN notified;Utilized relaxation techniques;Relaxation (given morphine 1/2 hour prior )    Home Living Family/patient expects to be discharged to:: Private residence Living Arrangements: Spouse/significant other               Additional Comments: 1 step to get into the front door     Prior Function Level of Independence: Independent               Hand Dominance   Dominant Hand: Right    Extremity/Trunk Assessment   Upper Extremity Assessment Upper Extremity Assessment: Defer to OT evaluation    Lower Extremity Assessment Lower Extremity Assessment: LLE deficits/detail LLE: Unable to fully assess due to pain       Communication   Communication: No difficulties  Cognition Arousal/Alertness: Awake/alert Behavior During Therapy: Anxious Overall Cognitive  Status: Within Functional Limits for tasks assessed                 General Comments: anxious but willing to participate. Improved anxiety with movement     General Comments General comments (skin integrity, edema, etc.): Psoterior hip bandage intact     Exercises Total Joint Exercises Ankle Circles/Pumps: 20 reps Quad Sets: 10 reps Short Arc Quad:  (reviewed ) Heel  Slides:  (reviewed with hip percuations)   Assessment/Plan    PT Assessment Patient needs continued PT services  PT Problem List Decreased strength;Decreased range of motion;Decreased activity tolerance;Decreased balance;Decreased mobility;Decreased safety awareness;Decreased knowledge of use of DME;Decreased knowledge of precautions;Decreased skin integrity;Pain          PT Treatment Interventions DME instruction;Gait training;Stair training;Functional mobility training;Therapeutic activities;Therapeutic exercise;Neuromuscular re-education;Patient/family education;Manual techniques    PT Goals (Current goals can be found in the Care Plan section)  Acute Rehab PT Goals Patient Stated Goal: To go home  PT Goal Formulation: With patient Time For Goal Achievement: 08/07/16 Potential to Achieve Goals: Good    Frequency 7X/week   Barriers to discharge   None at this time     Co-evaluation               End of Session Equipment Utilized During Treatment: Gait belt Activity Tolerance: Patient limited by pain;Patient tolerated treatment well Patient left: in chair;with call bell/phone within reach Nurse Communication: Mobility status;Weight bearing status;Precautions         Time: 1030-1100 PT Time Calculation (min) (ACUTE ONLY): 30 min   Charges:   PT Evaluation $PT Eval Low Complexity: 1 Procedure     PT G Codes:        [name] PT DPT  07/24/2016, 12:54 PM

## 2016-07-24 NOTE — Progress Notes (Signed)
Dilaudid 2 mg was administered for severe pain after PT session. Patient dozed off in his chair but awakens after an hour and began to complain about seeing nuts flying around. He added, he complained to the surgeon and the surgery has stopped his surgery. Those nuts have followed him to his room and are flying around. He was encouraged to try to sleep because he was hallucinating and there weren't nuts flying around in his room. He tried to point at the flying nut but there were none in his room. He called his wife who came-by and Mr Harriet Butte.A was called. Mr chandler ordered to discontinue the dilaudid, change oxycodone to 20 mg q4 PRN. Mr Tamera Punt also wants RNs to give 10 mg of oxycodone first and then 20 mg when pain is unmanaged.

## 2016-07-24 NOTE — Evaluation (Signed)
Occupational Therapy Evaluation Patient Details Name: Norman Hudson MRN: [id number] DOB: October 22, 1950 Today's Date: 07/24/2016    History of Present Illness S/P THA on 07/24/2016.  Has had lower back surgery in the past. Had posterior aprroach THA. PMH :  DMII HTN, anxiety, Joint pain Lumbar surgery 2016. Has had pain control issue with the lower back surgery.    Clinical Impression   Evaluation limited due to cognitive changes suspect to medication, but believe Pt was independent PTA. Pt currently max A for LB ADL and set up for grooming tasks, mod assist for transfer with RW. Pt performance below. Pt will benefit from skilled OT in the acute care setting prior to dc with wife as caregiver to maximize independence and safety in ADL and functional transfers. Next session to address LB ADL (AE kit) and 3 in 1 as shower chair.     Follow Up Recommendations  No OT follow up;Supervision/Assistance - 24 hour    Equipment Recommendations  3 in 1 bedside commode    Recommendations for Other Services       Precautions / Restrictions Precautions Precautions: Posterior Hip Precaution Booklet Issued: Yes (comment) Precaution Comments: Pt able to recall 1/3 precautions, reviwed again with Pt Restrictions Weight Bearing Restrictions: Yes Other Position/Activity Restrictions: WBAT      Mobility Bed Mobility               General bed mobility comments: Pt sitting OOB in recliner, and declined going back to bed at this time  Transfers Overall transfer level: Needs assistance Equipment used: Rolling walker (2 wheeled) Transfers: Sit to/from Stand Sit to Stand: Mod assist         General transfer comment: mod A with vc and assist to bring hips to edge of recliner to maintain precautions    Balance Overall balance assessment: Needs assistance Sitting-balance support: No upper extremity supported;Feet supported Sitting balance-Leahy Scale: Fair Sitting balance -  Comments: Hesitant to put weight on left side in siitting    Standing balance support: Bilateral upper extremity supported Standing balance-Leahy Scale: Poor Standing balance comment: reliant on RW and therapist                            ADL Overall ADL's : Needs assistance/impaired Eating/Feeding: Set up;Sitting   Grooming: Wash/dry face;Oral care;Set up;Sitting Grooming Details (indicate cue type and reason): in recliner Upper Body Bathing: Moderate assistance;Sitting   Lower Body Bathing: Maximal assistance;Sitting/lateral leans;With caregiver independent assisting   Upper Body Dressing : Set up;Sitting   Lower Body Dressing: Maximal assistance;Sit to/from stand   Toilet Transfer: Moderate assistance;BSC;RW Toilet Transfer Details (indicate cue type and reason): simulated, sit <> stand from recliner, vc for hand placement Toileting- Clothing Manipulation and Hygiene: Total assistance       Functional mobility during ADLs:  (not attempted this session due to impaired cognition) General ADL Comments: limited evaluation due to cognition suspect of medication     Vision     Perception     Praxis      Pertinent Vitals/Pain Pain Assessment: 0-10 Pain Score: 8  Pain Location: left hip Pain Descriptors / Indicators: Aching Pain Intervention(s): Limited activity within patient's tolerance;Monitored during session;Repositioned     Hand Dominance Right   Extremity/Trunk Assessment Upper Extremity Assessment Upper Extremity Assessment: Overall WFL for tasks assessed   Lower Extremity Assessment Lower Extremity Assessment: LLE deficits/detail LLE Deficits / Details: post op deficits in ROM  and strength LLE: Unable to fully assess due to pain   Cervical / Trunk Assessment Cervical / Trunk Assessment: Other exceptions Cervical / Trunk Exceptions: history of back surgery   Communication Communication Communication: No difficulties   Cognition  Arousal/Alertness: Suspect due to medications Behavior During Therapy: Anxious Overall Cognitive Status: No family/caregiver present to determine baseline cognitive functioning                 General Comments: Pt unable to answer home information accurately and unable to have a cohesive thought process/conversation   General Comments       Exercises       Shoulder Instructions      Home Living Family/patient expects to be discharged to:: Private residence Living Arrangements: Spouse/significant other Available Help at Discharge: Family;Available 24 hours/day Type of Home: House Home Access: Stairs to enter [employer] of Steps: 1   Home Layout: One level     Bathroom Shower/Tub: Tub/shower unit Shower/tub characteristics: Engineer, building services: Standard Bathroom Accessibility: Yes How Accessible: Accessible via walker Home Equipment: Cane - single point   Additional Comments: Pt answered to the best of his abilities. Cognition suspect due to medication      Prior Functioning/Environment Level of Independence: Independent        Comments: independent per PT note. Pt impacted by medication and not able to answer questions coherently.        OT Problem List: Decreased strength;Decreased range of motion;Decreased activity tolerance;Impaired balance (sitting and/or standing);Decreased cognition;Decreased safety awareness;Decreased knowledge of use of DME or AE;Decreased knowledge of precautions;Pain   OT Treatment/Interventions: Self-care/ADL training;Therapeutic activities;Patient/family education;Balance training    OT Goals(Current goals can be found in the care plan section) Acute Rehab OT Goals Patient Stated Goal: To go home  OT Goal Formulation: With patient Time For Goal Achievement: 07/31/16 Potential to Achieve Goals: Good ADL Goals Pt Will Perform Lower Body Bathing: with supervision;with adaptive equipment;sitting/lateral leans Pt  Will Perform Lower Body Dressing: sit to/from stand;with modified independence;with adaptive equipment Pt Will Transfer to Toilet: with supervision;ambulating;bedside commode (RW) Pt Will Perform Toileting - Clothing Manipulation and hygiene: with modified independence;sit to/from stand Pt Will Perform Tub/Shower Transfer: Tub transfer;with min guard assist;3 in 1;rolling walker;ambulating Additional ADL Goal #1: Pt will recall 3/3 hip precautions and maintain during ADL  OT Frequency: Min 2X/week   Barriers to D/C:            Co-evaluation              End of Session Equipment Utilized During Treatment: Gait belt;Rolling walker Nurse Communication: Mobility status;Other (comment) (cognitive status, RN aware)  Activity Tolerance: Patient tolerated treatment well Patient left: in chair;with call bell/phone within reach   Time: 1532-1559 OT Time Calculation (min): 27 min Charges:  OT General Charges $OT Visit: 1 Procedure OT Evaluation $OT Eval Moderate Complexity: 1 Procedure OT Treatments $Self Care/Home Management : 8-22 mins G-Codes:    Evern Bio Junita Kubota 10-Aug-2016, 4:38 PM  Sherryl Manges OTR/L [phone]

## 2016-07-24 NOTE — Discharge Instructions (Signed)
INSTRUCTIONS AFTER JOINT REPLACEMENT  ° °o Remove items at home which could result in a fall. This includes throw rugs or furniture in walking pathways °o ICE to the affected joint every three hours while awake for 30 minutes at a time, for at least the first 3-5 days, and then as needed for pain and swelling.  Continue to use ice for pain and swelling. You may notice swelling that will progress down to the foot and ankle.  This is normal after surgery.  Elevate your leg when you are not up walking on it.   °o Continue to use the breathing machine you got in the hospital (incentive spirometer) which will help keep your temperature down.  It is common for your temperature to cycle up and down following surgery, especially at night when you are not up moving around and exerting yourself.  The breathing machine keeps your lungs expanded and your temperature down. ° ° °DIET:  As you were doing prior to hospitalization, we recommend a well-balanced diet. ° °DRESSING / WOUND CARE / SHOWERING ° °You may change your dressing 3-5 days after surgery.  Then change the dressing every day with sterile gauze.  Please use good hand washing techniques before changing the dressing.  Do not use any lotions or creams on the incision until instructed by your surgeon. ° °ACTIVITY ° °o Increase activity slowly as tolerated, but follow the weight bearing instructions below.   °o No driving for 6 weeks or until further direction given by your physician.  You cannot drive while taking narcotics.  °o No lifting or carrying greater than 10 lbs. until further directed by your surgeon. °o Avoid periods of inactivity such as sitting longer than an hour when not asleep. This helps prevent blood clots.  °o You may return to work once you are authorized by your doctor.  ° ° ° °WEIGHT BEARING  ° °Weight bearing as tolerated with assist device (walker, cane, etc) as directed, use it as long as suggested by your surgeon or therapist, typically at  least 4-6 weeks. ° ° °EXERCISES ° °Results after joint replacement surgery are often greatly improved when you follow the exercise, range of motion and muscle strengthening exercises prescribed by your doctor. Safety measures are also important to protect the joint from further injury. Any time any of these exercises cause you to have increased pain or swelling, decrease what you are doing until you are comfortable again and then slowly increase them. If you have problems or questions, call your caregiver or physical therapist for advice.  ° °Rehabilitation is important following a joint replacement. After just a few days of immobilization, the muscles of the leg can become weakened and shrink (atrophy).  These exercises are designed to build up the tone and strength of the thigh and leg muscles and to improve motion. Often times heat used for twenty to thirty minutes before working out will loosen up your tissues and help with improving the range of motion but do not use heat for the first two weeks following surgery (sometimes heat can increase post-operative swelling).  ° °These exercises can be done on a training (exercise) mat, on the floor, on a table or on a bed. Use whatever works the best and is most comfortable for you.    Use music or television while you are exercising so that the exercises are a pleasant break in your day. This will make your life better with the exercises acting as a break   in your routine that you can look forward to.   Perform all exercises about fifteen times, three times per day or as directed.  You should exercise both the operative leg and the other leg as well. ° °Exercises include: °  °• Quad Sets - Tighten up the muscle on the front of the thigh (Quad) and hold for 5-10 seconds.   °• Straight Leg Raises - With your knee straight (if you were given a brace, keep it on), lift the leg to 60 degrees, hold for 3 seconds, and slowly lower the leg.  Perform this exercise against  resistance later as your leg gets stronger.  °• Leg Slides: Lying on your back, slowly slide your foot toward your buttocks, bending your knee up off the floor (only go as far as is comfortable). Then slowly slide your foot back down until your leg is flat on the floor again.  °• Angel Wings: Lying on your back spread your legs to the side as far apart as you can without causing discomfort.  °• Hamstring Strength:  Lying on your back, push your heel against the floor with your leg straight by tightening up the muscles of your buttocks.  Repeat, but this time bend your knee to a comfortable angle, and push your heel against the floor.  You may put a pillow under the heel to make it more comfortable if necessary.  ° °A rehabilitation program following joint replacement surgery can speed recovery and prevent re-injury in the future due to weakened muscles. Contact your doctor or a physical therapist for more information on knee rehabilitation.  ° ° °CONSTIPATION ° °Constipation is defined medically as fewer than three stools per week and severe constipation as less than one stool per week.  Even if you have a regular bowel pattern at home, your normal regimen is likely to be disrupted due to multiple reasons following surgery.  Combination of anesthesia, postoperative narcotics, change in appetite and fluid intake all can affect your bowels.  ° °YOU MUST use at least one of the following options; they are listed in order of increasing strength to get the job done.  They are all available over the counter, and you may need to use some, POSSIBLY even all of these options:   ° °Drink plenty of fluids (prune juice may be helpful) and high fiber foods °Colace 100 mg by mouth twice a day  °Senokot for constipation as directed and as needed Dulcolax (bisacodyl), take with full glass of water  °Miralax (polyethylene glycol) once or twice a day as needed. ° °If you have tried all these things and are unable to have a bowel  movement in the first 3-4 days after surgery call either your surgeon or your primary doctor.   ° °If you experience loose stools or diarrhea, hold the medications until you stool forms back up.  If your symptoms do not get better within 1 week or if they get worse, check with your doctor.  If you experience "the worst abdominal pain ever" or develop nausea or vomiting, please contact the office immediately for further recommendations for treatment. ° ° °ITCHING:  If you experience itching with your medications, try taking only a single pain pill, or even half a pain pill at a time.  You can also use Benadryl over the counter for itching or also to help with sleep.  ° °TED HOSE STOCKINGS:  Use stockings on both legs until for at least 2 weeks or as   directed by physician office. They may be removed at night for sleeping. ° °MEDICATIONS:  See your medication summary on the “After Visit Summary” that nursing will review with you.  You may have some home medications which will be placed on hold until you complete the course of blood thinner medication.  It is important for you to complete the blood thinner medication as prescribed. ° °PRECAUTIONS:  If you experience chest pain or shortness of breath - call 911 immediately for transfer to the hospital emergency department.  ° °If you develop a fever greater that 101 F, purulent drainage from wound, increased redness or drainage from wound, foul odor from the wound/dressing, or calf pain - CONTACT YOUR SURGEON.   °                                                °FOLLOW-UP APPOINTMENTS:  If you do not already have a post-op appointment, please call the office for an appointment to be seen by your surgeon.  Guidelines for how soon to be seen are listed in your “After Visit Summary”, but are typically between 1-4 weeks after surgery. ° °OTHER INSTRUCTIONS:  ° °Knee Replacement:  Do not place pillow under knee, focus on keeping the knee straight while resting. CPM  instructions: 0-90 degrees, 2 hours in the morning, 2 hours in the afternoon, and 2 hours in the evening. Place foam block, curve side up under heel at all times except when in CPM or when walking.  DO NOT modify, tear, cut, or change the foam block in any way. ° °MAKE SURE YOU:  °• Understand these instructions.  °• Get help right away if you are not doing well or get worse.  ° ° °Thank you for letting us be a part of your medical care team.  It is a privilege we respect greatly.  We hope these instructions will help you stay on track for a fast and full recovery!  ° ° ° °Information on my medicine - ELIQUIS® (apixaban) ° °This medication education was reviewed with me or my healthcare representative as part of my discharge preparation.  The pharmacist that spoke with me during my hospital stay was:  Chrishun Scheer P, RPH ° °Why was Eliquis® prescribed for you? °Eliquis® was prescribed for you to reduce the risk of blood clots forming after orthopedic surgery.   ° °What do You need to know about Eliquis®? °Take your Eliquis® TWICE DAILY - one tablet in the morning and one tablet in the evening with or without food.  It would be best to take the dose about the same time each day. ° °If you have difficulty swallowing the tablet whole please discuss with your pharmacist how to take the medication safely. ° °Take Eliquis® exactly as prescribed by your doctor and DO NOT stop taking Eliquis® without talking to the doctor who prescribed the medication.  Stopping without other medication to take the place of Eliquis® may increase your risk of developing a clot. ° °After discharge, you should have regular check-up appointments with your healthcare provider that is prescribing your Eliquis®. ° °What do you do if you miss a dose? °If a dose of ELIQUIS® is not taken at the scheduled time, take it as soon as possible on the same day and twice-daily administration should be resumed.  The dose should not   be doubled to make up for  a missed dose.  Do not take more than one tablet of ELIQUIS at the same time. ° °Important Safety Information °A possible side effect of Eliquis® is bleeding. You should call your healthcare provider right away if you experience any of the following: °? Bleeding from an injury or your nose that does not stop. °? Unusual colored urine (red or dark brown) or unusual colored stools (red or black). °? Unusual bruising for unknown reasons. °? A serious fall or if you hit your head (even if there is no bleeding). ° °Some medicines may interact with Eliquis® and might increase your risk of bleeding or clotting while on Eliquis®. To help avoid this, consult your healthcare provider or pharmacist prior to using any new prescription or non-prescription medications, including herbals, vitamins, non-steroidal anti-inflammatory drugs (NSAIDs) and supplements. ° °This website has more information on Eliquis® (apixaban): http://www.eliquis.com/eliquis/home ° °

## 2016-07-24 NOTE — Progress Notes (Signed)
Subjective: 1 Day Post-Op Procedure(s) (LRB): TOTAL HIP ARTHROPLASTY (Left) Patient reports pain as moderate.    Objective: Vital signs in last 24 hours: Temp:  [97.4 F (36.3 C)-99.5 F (37.5 C)] 98.4 F (36.9 C) (12/16 0607) Pulse Rate:  [65-90] 82 (12/16 0607) Resp:  [12-16] 16 (12/15 1635) BP: (101-129)/(51-73) 101/57 (12/16 0607) SpO2:  [90 %-100 %] 90 % (12/16 0607)  Intake/Output from previous day: 12/15 0701 - 12/16 0700 In: 1750 [I.V.:1750] Out: 650 [Urine:250; Blood:400] Intake/Output this shift: No intake/output data recorded.   Recent Labs  07/24/16 0452  HGB 13.8    Recent Labs  07/24/16 0452  WBC 11.2*  RBC 4.43  HCT 41.7  PLT 162    Recent Labs  07/24/16 0452  NA 137  K 4.2  CL 101  CO2 27  BUN 18  CREATININE 1.17  GLUCOSE 119*  CALCIUM 8.4*   No results for input(s): LABPT, INR in the last 72 hours.  Neurovascular intact Sensation intact distally Intact pulses distally Dorsiflexion/Plantar flexion intact Incision: dressing C/D/I  Assessment/Plan: 1 Day Post-Op Procedure(s) (LRB): TOTAL HIP ARTHROPLASTY (Left) Up with therapy Plan for discharge tomorrow  Discussed weaning on iv pain med today in anticipation of d/c home tomorrow Would like PT twice today if possible and again in the morning for hopeful d/c home tomorrow afternoon  Chriss Czar 07/24/2016, 9:03 AM

## 2016-07-25 LAB — CBC
HCT: 39.4 % (ref 39.0–52.0)
Hemoglobin: 13.3 g/dL (ref 13.0–17.0)
MCH: 31.3 pg (ref 26.0–34.0)
MCHC: 33.8 g/dL (ref 30.0–36.0)
MCV: 92.7 fL (ref 78.0–100.0)
PLATELETS: 139 10*3/uL — AB (ref 150–400)
RBC: 4.25 MIL/uL (ref 4.22–5.81)
RDW: 13.5 % (ref 11.5–15.5)
WBC: 12.1 10*3/uL — AB (ref 4.0–10.5)

## 2016-07-25 LAB — GLUCOSE, CAPILLARY
Glucose-Capillary: 138 mg/dL — ABNORMAL HIGH (ref 65–99)
Glucose-Capillary: 121 mg/dL — ABNORMAL HIGH (ref 65–99)

## 2016-07-25 MED ORDER — APIXABAN 2.5 MG PO TABS: 2.5000 mg | ORAL_TABLET | Freq: Two times a day (BID) | ORAL | 0 refills | Status: DC

## 2016-07-25 NOTE — Progress Notes (Signed)
Subjective: 2 Days Post-Op Procedure(s) (LRB): TOTAL HIP ARTHROPLASTY (Left) Patient reports pain as mild and moderate.    Objective: Vital signs in last 24 hours: Temp:  [97.8 F (36.6 C)-99.1 F (37.3 C)] 97.8 F (36.6 C) (12/17 0407) Pulse Rate:  [80-105] 95 (12/17 0407) Resp:  [16-18] 18 (12/17 0842) BP: (92-148)/(58-89) 121/67 (12/17 0842) SpO2:  [93 %-95 %] 93 % (12/17 0842)  Intake/Output from previous day: 12/16 0701 - 12/17 0700 In: 600 [P.O.:600] Out: 650 [Urine:650] Intake/Output this shift: Total I/O In: 240 [P.O.:240] Out: -    Recent Labs  07/24/16 0452 07/25/16 0356  HGB 13.8 13.3    Recent Labs  07/24/16 0452 07/25/16 0356  WBC 11.2* 12.1*  RBC 4.43 4.25  HCT 41.7 39.4  PLT 162 139*    Recent Labs  07/24/16 0452  NA 137  K 4.2  CL 101  CO2 27  BUN 18  CREATININE 1.17  GLUCOSE 119*  CALCIUM 8.4*   No results for input(s): LABPT, INR in the last 72 hours.  Neurovascular intact Sensation intact distally Intact pulses distally Dorsiflexion/Plantar flexion intact Incision: dressing C/D/I, scant drainage and dsg changed  No cellulitis present  Assessment/Plan: 2 Days Post-Op Procedure(s) (LRB): TOTAL HIP ARTHROPLASTY (Left) Up with therapy Discharge home with home health  Norman Hudson 07/25/2016, 11:40 AM

## 2016-07-25 NOTE — Care Management (Signed)
Reviewed chart for possible discharge. Patient has received DME. Presenter, broadcasting BSN CCM

## 2016-07-25 NOTE — Progress Notes (Signed)
Physical Therapy Treatment Patient Details Name: Norman Hudson MRN: [id number] DOB: 12-20-1950 Today's Date: 07/25/2016    History of Present Illness S/P THA on 07/24/2016.  Has had lower back surgery in the past. Had posterior aprroach THA. PMH :  DMII HTN, anxiety, Joint pain Lumbar surgery 2016. Has had pain control issue with the lower back surgery.     PT Comments    Noting progress with activity tolerance and hip control; Stair training intitiated; Will benefit from reviewing steps with wife next session; Will likely be able to dc home today, provided his pain is under control  Follow Up Recommendations  Home health PT     Equipment Recommendations  Rolling walker with 5" wheels;3in1 (PT) (already delovered to room)    Recommendations for Other Services       Precautions / Restrictions Precautions Precautions: Posterior Hip Precaution Booklet Issued: Yes (comment) Precaution Comments: Pt able to recall 2/3 precautions, reviwed again with Pt; needing clarification with no internal rotation Restrictions Other Position/Activity Restrictions: WBAT    Mobility  Bed Mobility Overal bed mobility: Needs Assistance Bed Mobility: Sit to Supine       Sit to supine: Min assist   General bed mobility comments: Min assist to help LLE into bed; pt used belt to lift LLE into bed with some success  Transfers Overall transfer level: Needs assistance Equipment used: Rolling walker (2 wheeled) Transfers: Sit to/from Stand Sit to Stand: Min guard         General transfer comment: Cues for hand placement, positioning for post hip prec; tends to hold onto Rw during descent to sit  Ambulation/Gait Ambulation/Gait assistance: Min guard Ambulation Distance (Feet): 120 Feet Assistive device: Rolling walker (2 wheeled) Gait Pattern/deviations: Step-to pattern     General Gait Details: Encouraged increasing steps length bilaterally; cues for upright posture; adjusted Rw for  optimal fit   Stairs Stairs: Yes   Stair Management: No rails;Backwards;Step to pattern;With walker Number of Stairs: 2 General stair comments: Step by step cues for technique; overall no difficulty with steps; will benefit from reviewing with wife during afternoon session  Wheelchair Mobility    Modified Rankin (Stroke Patients Only)       Balance     Sitting balance-Leahy Scale: Fair       Standing balance-Leahy Scale: Poor                      Cognition Arousal/Alertness: Awake/alert Behavior During Therapy: WFL for tasks assessed/performed Overall Cognitive Status: Within Functional Limits for tasks assessed                      Exercises Total Joint Exercises Gluteal Sets: AROM;Both;10 reps Towel Squeeze: AROM;Both;10 reps Heel Slides: AAROM;Left;10 reps Hip ABduction/ADduction: AAROM;Left;10 reps    General Comments        Pertinent Vitals/Pain Pain Assessment: 0-10 Pain Score: 8  Pain Location: 8 at its worst, L hip with WBing Pain Descriptors / Indicators: Aching Pain Intervention(s): Monitored during session    Home Living                      Prior Function            PT Goals (current goals can now be found in the care plan section) Acute Rehab PT Goals Patient Stated Goal: To go home  PT Goal Formulation: With patient Time For Goal Achievement: 08/07/16 Potential to Achieve Goals: Good  Progress towards PT goals: Progressing toward goals    Frequency    7X/week      PT Plan Current plan remains appropriate    Co-evaluation             End of Session Equipment Utilized During Treatment: Gait belt Activity Tolerance: Patient tolerated treatment well Patient left: in bed;with call bell/phone within reach     Time: 0748-0818 PT Time Calculation (min) (ACUTE ONLY): 30 min  Charges:  $Gait Training: 8-22 mins $Therapeutic Exercise: 8-22 mins                    G Codes:      Levi Aland Aug 17, 2016, 8:36 AM  Van Clines, PT  Acute Rehabilitation Services Pager [phone] Office [phone]

## 2016-07-25 NOTE — Discharge Summary (Signed)
PATIENT ID: Norman Hudson        MRN:  [id number]          DOB/AGE: January 30, 1951 / 65 y.o.    DISCHARGE SUMMARY  ADMISSION DATE:    07/23/2016 DISCHARGE DATE:   07/25/2016   ADMISSION DIAGNOSIS: OA LEFT HIP    DISCHARGE DIAGNOSIS:  OA LEFT HIP    ADDITIONAL DIAGNOSIS: Active Problems:   Osteoarthritis of left hip  Past Medical History:  Diagnosis Date  . Acne   . Anxiety    takes Xanax daily as needed  . Arthritis   . Diabetes mellitus without complication (HCC)    takes Metformin daily-per pt prediabetes  . History of kidney stones   . Hyperlipidemia    takes Pravastatin daily  . Hypertension    takes Amlodipine and Valsartan-HCTZ daily  . Hypothyroidism    takes Synthroid daily  . Inguinal hernia    left side  . Internal hemorrhoids   . Joint pain   . Rosacea     PROCEDURE: Procedure(s): TOTAL HIP ARTHROPLASTY Left on 07/23/2016  CONSULTS: PT/OT    HISTORY:  See H&P in chart  HOSPITAL COURSE:  Norman Hudson is a 65 y.o. admitted on 07/23/2016 and found to have a diagnosis of OA LEFT HIP.  After appropriate laboratory studies were obtained  they were taken to the operating room on 07/23/2016 and underwent  Procedure(s): TOTAL HIP ARTHROPLASTY  Left.   They were given perioperative antibiotics:  Anti-infectives    Start     Dose/Rate Route Frequency Ordered Stop   07/24/16 1000  valACYclovir (VALTREX) tablet 500 mg     500 mg Oral Daily 07/23/16 1544     07/23/16 1600  ceFAZolin (ANCEF) IVPB 2g/100 mL premix     2 g 200 mL/hr over 30 Minutes Intravenous Every 6 hours 07/23/16 1544 07/24/16 0359   07/23/16 0930  ceFAZolin (ANCEF) IVPB 2g/100 mL premix     2 g 200 mL/hr over 30 Minutes Intravenous To [facility] 07/22/16 1336 07/23/16 1004    .  Tolerated the procedure well.    POD #1, allowed out of bed to a chair.  PT for ambulation and exercise program. IV saline locked.  O2 discontionued.  POD #2, continued PT and ambulation.      The remainder of the hospital course was dedicated to ambulation and strengthening.   The patient was discharged on 2 Days Post-Op in  Stable condition.  Blood products given:none  DIAGNOSTIC STUDIES: Recent vital signs:  Patient Vitals for the past 24 hrs:  BP Temp Temp src Pulse Resp SpO2  07/25/16 0842 121/67 - - - 18 93 %  07/25/16 0407 126/70 97.8 F (36.6 C) Oral 95 - 94 %  07/24/16 2100 (!) 148/76 98.4 F (36.9 C) Oral (!) 105 16 95 %  07/24/16 1740 127/89 99.1 F (37.3 C) - - - -  07/24/16 1400 (!) 92/58 98.2 F (36.8 C) Oral 80 18 93 %       Recent laboratory studies:  Recent Labs  07/24/16 0452 07/25/16 0356  WBC 11.2* 12.1*  HGB 13.8 13.3  HCT 41.7 39.4  PLT 162 139*    Recent Labs  07/24/16 0452  NA 137  K 4.2  CL 101  CO2 27  BUN 18  CREATININE 1.17  GLUCOSE 119*  CALCIUM 8.4*   Lab Results  Component Value Date   INR 1.14 07/12/2016   INR 1.13 06/27/2015  Recent Radiographic Studies :  Dg Chest 2 View  Result Date: 07/12/2016 CLINICAL DATA:  Pre op for a left hip replacement No recent chest complaints Hx of HTN- on meds, pre DM- on meds, ex-smoker x 25 years EXAM: CHEST  2 VIEW COMPARISON:  06/27/2015 FINDINGS: Cardiac silhouette is normal in size. No mediastinal or hilar masses. No evidence of adenopathy. The lungs are clear.  No pleural effusion.  No pneumothorax. Skeletal structures are demineralized but intact. IMPRESSION: No active cardiopulmonary disease. Electronically Signed   By: Amie Portland M.D.   On: 07/12/2016 14:09   Dg Hip Port Unilat With Pelvis 1v Left  Result Date: 07/23/2016 CLINICAL DATA:  Total hip arthroplasty EXAM: DG HIP (WITH OR WITHOUT PELVIS) 1V PORT LEFT COMPARISON:  09/01/2015 FINDINGS: Changes of left hip replacement. No hardware or bony complicating feature. Normal AP alignment. IMPRESSION: Left hip replacement.  No complicating feature. Electronically Signed   By: Charlett Nose M.D.   On: 07/23/2016 14:37     DISCHARGE INSTRUCTIONS:   DISCHARGE MEDICATIONS:   Allergies as of 07/25/2016      Reactions   Tramadol Itching, Rash      Medication List    STOP taking these medications   aspirin EC 81 MG tablet   meloxicam 7.5 MG tablet Commonly known as:  MOBIC     TAKE these medications   ALPRAZolam 0.5 MG tablet Commonly known as:  XANAX Take 0.5 mg by mouth at bedtime as needed for anxiety.   amLODipine 5 MG tablet Commonly known as:  NORVASC Take 5 mg by mouth 2 (two) times daily.   apixaban 2.5 MG Tabs tablet Commonly known as:  ELIQUIS Take 1 tablet (2.5 mg total) by mouth every 12 (twelve) hours.   cholecalciferol 1000 units tablet Commonly known as:  VITAMIN D Take 1,000 Units by mouth daily.   doxycycline 100 MG tablet Commonly known as:  VIBRA-TABS Take 100 mg by mouth daily as needed (for adult acne).   levothyroxine 112 MCG tablet Commonly known as:  SYNTHROID, LEVOTHROID Take 112 mcg by mouth daily before breakfast.   metFORMIN 500 MG tablet Commonly known as:  GLUCOPHAGE Take 500 mg by mouth daily with breakfast.   multivitamin with minerals tablet Take 1 tablet by mouth daily.   oxyCODONE-acetaminophen 10-325 MG tablet Commonly known as:  PERCOCET 1-2 tabs po q4-6hrs prn pain What changed:  how much to take  how to take this  when to take this  reasons to take this  additional instructions  Another medication with the same name was removed. Continue taking this medication, and follow the directions you see here.   pravastatin 20 MG tablet Commonly known as:  PRAVACHOL Take 20 mg by mouth at bedtime.   testosterone cypionate 200 MG/ML injection Commonly known as:  DEPOTESTOSTERONE CYPIONATE Inject 100 mg into the muscle every 7 (seven) days. On Tuesdays   valACYclovir 500 MG tablet Commonly known as:  VALTREX Take 500 mg by mouth daily.   valsartan-hydrochlorothiazide 320-25 MG tablet Commonly known as:  DIOVAN-HCT Take 1 tablet  by mouth daily.   vitamin B-12 500 MCG tablet Commonly known as:  CYANOCOBALAMIN Take 500 mcg by mouth daily.   Zinc 25 MG Tabs Take 25 mg by mouth daily.            Durable Medical Equipment        Start     Ordered   07/23/16 1545  DME Walker rolling  Once  Question:  Patient needs a walker to treat with the following condition  Answer:  Osteoarthritis of left hip   07/23/16 1544   07/23/16 1545  DME 3 n 1  Once     07/23/16 1544      FOLLOW UP VISIT:   Follow-up Information    CAFFREY JR,W D, MD. Schedule an appointment as soon as possible for a visit in 2 week(s).   Specialty:  Orthopedic Surgery Contact information: [address] [place] [address] [phone]        [facility] Follow up.   Specialty:  Home Health Services Why:  home health physical therapy Contact information: [address] [place] [address] [phone]           DISPOSITION:   Home  CONDITION:  Stable   Margart Sickles, PA-C  07/25/2016 11:44 AM

## 2016-07-25 NOTE — Progress Notes (Signed)
Physical Therapy Treatment Patient Details Name: Norman Hudson MRN: [id number] DOB: 1950-10-16 Today's Date: 07/25/2016    History of Present Illness S/P THA on 07/24/2016.  Has had lower back surgery in the past. Had posterior aprroach THA. PMH :  DMII HTN, anxiety, Joint pain Lumbar surgery 2016. Has had pain control issue with the lower back surgery.     PT Comments    Patient is making good progress with PT.  From a mobility standpoint anticipate patient will be ready for DC home when medically ready.     Follow Up Recommendations  Home health PT     Equipment Recommendations  Rolling walker with 5" wheels;3in1 (PT) (already delovered to room)    Recommendations for Other Services       Precautions / Restrictions Precautions Precautions: Posterior Hip Precaution Booklet Issued: Yes (comment) Precaution Comments: reviewed precautions with pt and wife X2 Restrictions Weight Bearing Restrictions: Yes Other Position/Activity Restrictions: WBAT    Mobility  Bed Mobility               General bed mobility comments: pt OOB in chair upn arrival  Transfers Overall transfer level: Needs assistance Equipment used: Rolling walker (2 wheeled) Transfers: Sit to/from Stand Sit to Stand: Min guard         General transfer comment: cues for hand placement and technique  Ambulation/Gait Ambulation/Gait assistance: Min guard Ambulation Distance (Feet): 150 Feet Assistive device: Rolling walker (2 wheeled) Gait Pattern/deviations: Step-to pattern;Step-through pattern;Decreased stance time - left;Decreased step length - right;Decreased weight shift to left     General Gait Details: cues for sequencing, step length symmetry, and L heel strike; pt with improved step through pattern with increased distance   Stairs Stairs: Yes   Stair Management: No rails;Backwards;Step to pattern;With walker Number of Stairs: 2 General stair comments: cues for sequencing and  technique; wife stabilized RW  Wheelchair Mobility    Modified Rankin (Stroke Patients Only)       Balance     Sitting balance-Leahy Scale: Good       Standing balance-Leahy Scale: Poor                      Cognition Arousal/Alertness: Awake/alert Behavior During Therapy: WFL for tasks assessed/performed Overall Cognitive Status: Within Functional Limits for tasks assessed                      Exercises Total Joint Exercises Hip ABduction/ADduction: Left;10 reps;AROM;Standing Knee Flexion: AROM;Left;10 reps;Standing Marching in Standing: AROM;Left;10 reps;Standing    General Comments        Pertinent Vitals/Pain Pain Assessment: Faces Faces Pain Scale: Hurts little more Pain Location: 8 at its worst, L hip with WBing Pain Descriptors / Indicators: Aching;Guarding Pain Intervention(s): Limited activity within patient's tolerance;Monitored during session;Premedicated before session;Repositioned    Home Living                      Prior Function            PT Goals (current goals can now be found in the care plan section) Acute Rehab PT Goals Patient Stated Goal: To go home  PT Goal Formulation: With patient Time For Goal Achievement: 08/07/16 Potential to Achieve Goals: Good Progress towards PT goals: Progressing toward goals    Frequency    7X/week      PT Plan Current plan remains appropriate    Co-evaluation  End of Session Equipment Utilized During Treatment: Gait belt Activity Tolerance: Patient tolerated treatment well Patient left: with call bell/phone within reach;in chair;with family/visitor present;with nursing/sitter in room     Time: 0272-5366 PT Time Calculation (min) (ACUTE ONLY): 34 min  Charges:  $Gait Training: 8-22 mins $Therapeutic Exercise: 8-22 mins                    G Codes:      Derek Mound, PTA Pager: [phone]   07/25/2016, 1:53 PM

## 2016-07-25 NOTE — Progress Notes (Signed)
Discharge instructions provided to patient and wife.  IV removed.  Prescriptions provided to patient who gave to son to pick up.  Dressing changed by PA this shift, additional dressing change materials provided to patient.  Pain medication provided to patient prior to discharge.  Equipment [facility] and front wheel walker) delivered to room.  Case management confirmed set-up of home health.  No questions at time of discharge.  Escorted via wheelchair.

## 2016-07-26 ENCOUNTER — Encounter (HOSPITAL_COMMUNITY): Payer: Self-pay | Admitting: Orthopedic Surgery

## 2016-07-26 NOTE — Op Note (Signed)
NAME:  Norman Hudson, Norman Hudson NO.:  [id number]  MEDICAL RECORD NO.:  [id number]  LOCATION:                               FACILITY:  [facility]  PHYSICIAN:  Lockie Pares, M.D.    DATE OF BIRTH:  03/04/1993  DATE OF PROCEDURE:  07/23/2016 DATE OF DISCHARGE:  07/25/2016                              OPERATIVE REPORT   PREOPERATIVE DIAGNOSIS:  Osteoarthritis, left hip.  POSTOPERATIVE DIAGNOSIS:  Osteoarthritis, left hip.  OPERATION:  Left total hip replacement (DePuy AML 13.5 mm stem with 8.5 mm neck length, 36-mm hip ball with ceramic femoral head, 52-mm acetabulum Pinnacle Gription with 10-degree lip liner.  SURGEON:  Lockie Pares, MD  ASSISTANTMarjo Bicker, PA.  ANESTHESIA:  Spinal with local supplementation with Exparel and Marcaine.  BLOOD LOSS:  400.  DESCRIPTION OF PROCEDURE:  Lateral position, posterior approach to the hip made with splitting of the iliotibial band gluteus maximus fascia. T-capsulotomy made in the hip.  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  We cut about 1 fingerbreadth above the lesser trochanter. Progressively reamed and rasped for 0.5 mm under reaming for a 13.5 mm small stature stem.  Attention was next directed to the acetabulum.  Thick labrum was removed.  We again reamed the acetabulum about 15-20 degrees of anteversion, 45 degrees of abduction back into good bleeding bone.  A 50 trial bottomed out easily, and the 52 did not.  Liked to use a 52 mm cup which allowed [place] to use a 36 mm head.  Final cup was inserted with a trial lip liner.  Then we trialed off the broach and again we noted that the hip was very stable.  Full flexion, full abduction and only a tendency towards dislocation, the hip started to sublux at about 50-60 degrees internal rotation.  Final components were inserted.  Acetabulum with a 10-degree lip liner followed by the femoral stem then trialed off the femoral stem and again agreed that the 8.5 mm  neck length was appropriate.  T-capsulotomy was closed with #1 Ethibond, gluteus maximus with #1 Tycron, and the skin with 0 and 2-0 Vicryl.  Lightly compressive sterile dressing applied with a knee immobilizer.  Taken to recovery room in stable condition.     Lockie Pares, M.D.     WDC/MEDQ  D:  07/23/2016  T:  07/24/2016  Job:  [id number]

## 2016-08-12 DIAGNOSIS — R69 Illness, unspecified: Secondary | ICD-10-CM | POA: Diagnosis not present

## 2016-08-12 DIAGNOSIS — Z23 Encounter for immunization: Secondary | ICD-10-CM | POA: Diagnosis not present

## 2016-08-12 DIAGNOSIS — Z125 Encounter for screening for malignant neoplasm of prostate: Secondary | ICD-10-CM | POA: Diagnosis not present

## 2016-08-12 DIAGNOSIS — R7303 Prediabetes: Secondary | ICD-10-CM | POA: Diagnosis not present

## 2016-08-12 DIAGNOSIS — I10 Essential (primary) hypertension: Secondary | ICD-10-CM | POA: Diagnosis not present

## 2016-08-12 DIAGNOSIS — E039 Hypothyroidism, unspecified: Secondary | ICD-10-CM | POA: Diagnosis not present

## 2016-08-12 DIAGNOSIS — Z0000 Encounter for general adult medical examination without abnormal findings: Secondary | ICD-10-CM | POA: Diagnosis not present

## 2016-08-12 DIAGNOSIS — E7800 Pure hypercholesterolemia, unspecified: Secondary | ICD-10-CM | POA: Diagnosis not present

## 2016-08-12 DIAGNOSIS — H6123 Impacted cerumen, bilateral: Secondary | ICD-10-CM | POA: Diagnosis not present

## 2016-08-16 DIAGNOSIS — M1612 Unilateral primary osteoarthritis, left hip: Secondary | ICD-10-CM | POA: Diagnosis not present

## 2016-08-17 DIAGNOSIS — Z96642 Presence of left artificial hip joint: Secondary | ICD-10-CM | POA: Diagnosis not present

## 2016-08-17 DIAGNOSIS — M25552 Pain in left hip: Secondary | ICD-10-CM | POA: Diagnosis not present

## 2016-08-17 DIAGNOSIS — M25652 Stiffness of left hip, not elsewhere classified: Secondary | ICD-10-CM | POA: Diagnosis not present

## 2016-08-17 DIAGNOSIS — R262 Difficulty in walking, not elsewhere classified: Secondary | ICD-10-CM | POA: Diagnosis not present

## 2016-08-23 DIAGNOSIS — H524 Presbyopia: Secondary | ICD-10-CM | POA: Diagnosis not present

## 2016-08-23 DIAGNOSIS — H2513 Age-related nuclear cataract, bilateral: Secondary | ICD-10-CM | POA: Diagnosis not present

## 2016-08-23 DIAGNOSIS — E119 Type 2 diabetes mellitus without complications: Secondary | ICD-10-CM | POA: Diagnosis not present

## 2016-08-23 DIAGNOSIS — H35033 Hypertensive retinopathy, bilateral: Secondary | ICD-10-CM | POA: Diagnosis not present

## 2016-08-23 DIAGNOSIS — H25013 Cortical age-related cataract, bilateral: Secondary | ICD-10-CM | POA: Diagnosis not present

## 2016-08-30 DIAGNOSIS — M25552 Pain in left hip: Secondary | ICD-10-CM | POA: Diagnosis not present

## 2016-08-31 DIAGNOSIS — M25652 Stiffness of left hip, not elsewhere classified: Secondary | ICD-10-CM | POA: Diagnosis not present

## 2016-08-31 DIAGNOSIS — R262 Difficulty in walking, not elsewhere classified: Secondary | ICD-10-CM | POA: Diagnosis not present

## 2016-08-31 DIAGNOSIS — M25552 Pain in left hip: Secondary | ICD-10-CM | POA: Diagnosis not present

## 2016-08-31 DIAGNOSIS — Z96642 Presence of left artificial hip joint: Secondary | ICD-10-CM | POA: Diagnosis not present

## 2016-09-13 DIAGNOSIS — M25552 Pain in left hip: Secondary | ICD-10-CM | POA: Diagnosis not present

## 2016-09-14 DIAGNOSIS — R69 Illness, unspecified: Secondary | ICD-10-CM | POA: Diagnosis not present

## 2016-09-16 DIAGNOSIS — Z125 Encounter for screening for malignant neoplasm of prostate: Secondary | ICD-10-CM | POA: Diagnosis not present

## 2016-09-16 DIAGNOSIS — E291 Testicular hypofunction: Secondary | ICD-10-CM | POA: Diagnosis not present

## 2016-09-23 DIAGNOSIS — E291 Testicular hypofunction: Secondary | ICD-10-CM | POA: Diagnosis not present

## 2016-09-27 DIAGNOSIS — M25552 Pain in left hip: Secondary | ICD-10-CM | POA: Diagnosis not present

## 2016-10-11 DIAGNOSIS — M25552 Pain in left hip: Secondary | ICD-10-CM | POA: Diagnosis not present

## 2016-10-15 DIAGNOSIS — E039 Hypothyroidism, unspecified: Secondary | ICD-10-CM | POA: Diagnosis not present

## 2016-10-25 DIAGNOSIS — M25552 Pain in left hip: Secondary | ICD-10-CM | POA: Diagnosis not present

## 2016-11-08 DIAGNOSIS — M25552 Pain in left hip: Secondary | ICD-10-CM | POA: Diagnosis not present

## 2016-11-29 DIAGNOSIS — M25552 Pain in left hip: Secondary | ICD-10-CM | POA: Diagnosis not present

## 2016-12-23 DIAGNOSIS — E291 Testicular hypofunction: Secondary | ICD-10-CM | POA: Diagnosis not present

## 2017-01-04 DIAGNOSIS — E291 Testicular hypofunction: Secondary | ICD-10-CM | POA: Diagnosis not present

## 2017-01-31 DIAGNOSIS — M25552 Pain in left hip: Secondary | ICD-10-CM | POA: Diagnosis not present

## 2017-03-03 DIAGNOSIS — E7800 Pure hypercholesterolemia, unspecified: Secondary | ICD-10-CM | POA: Diagnosis not present

## 2017-03-03 DIAGNOSIS — R7303 Prediabetes: Secondary | ICD-10-CM | POA: Diagnosis not present

## 2017-03-03 DIAGNOSIS — I10 Essential (primary) hypertension: Secondary | ICD-10-CM | POA: Diagnosis not present

## 2017-03-03 DIAGNOSIS — L719 Rosacea, unspecified: Secondary | ICD-10-CM | POA: Diagnosis not present

## 2017-03-03 DIAGNOSIS — E039 Hypothyroidism, unspecified: Secondary | ICD-10-CM | POA: Diagnosis not present

## 2017-03-03 DIAGNOSIS — I700 Atherosclerosis of aorta: Secondary | ICD-10-CM | POA: Diagnosis not present

## 2017-04-19 DIAGNOSIS — R69 Illness, unspecified: Secondary | ICD-10-CM | POA: Diagnosis not present

## 2017-04-21 DIAGNOSIS — E039 Hypothyroidism, unspecified: Secondary | ICD-10-CM | POA: Diagnosis not present

## 2017-05-31 DIAGNOSIS — E039 Hypothyroidism, unspecified: Secondary | ICD-10-CM | POA: Diagnosis not present

## 2017-06-07 DIAGNOSIS — R69 Illness, unspecified: Secondary | ICD-10-CM | POA: Diagnosis not present

## 2017-07-08 DIAGNOSIS — E291 Testicular hypofunction: Secondary | ICD-10-CM | POA: Diagnosis not present

## 2017-07-14 DIAGNOSIS — E291 Testicular hypofunction: Secondary | ICD-10-CM | POA: Diagnosis not present

## 2017-07-28 DIAGNOSIS — M25552 Pain in left hip: Secondary | ICD-10-CM | POA: Diagnosis not present

## 2017-08-18 DIAGNOSIS — E039 Hypothyroidism, unspecified: Secondary | ICD-10-CM | POA: Diagnosis not present

## 2017-08-18 DIAGNOSIS — R739 Hyperglycemia, unspecified: Secondary | ICD-10-CM | POA: Diagnosis not present

## 2017-08-18 DIAGNOSIS — E7800 Pure hypercholesterolemia, unspecified: Secondary | ICD-10-CM | POA: Diagnosis not present

## 2017-08-18 DIAGNOSIS — Z0000 Encounter for general adult medical examination without abnormal findings: Secondary | ICD-10-CM | POA: Diagnosis not present

## 2017-08-18 DIAGNOSIS — I10 Essential (primary) hypertension: Secondary | ICD-10-CM | POA: Diagnosis not present

## 2017-08-18 DIAGNOSIS — I700 Atherosclerosis of aorta: Secondary | ICD-10-CM | POA: Diagnosis not present

## 2017-08-18 DIAGNOSIS — H9190 Unspecified hearing loss, unspecified ear: Secondary | ICD-10-CM | POA: Diagnosis not present

## 2017-08-18 DIAGNOSIS — Z6832 Body mass index (BMI) 32.0-32.9, adult: Secondary | ICD-10-CM | POA: Diagnosis not present

## 2017-08-18 DIAGNOSIS — L719 Rosacea, unspecified: Secondary | ICD-10-CM | POA: Diagnosis not present

## 2017-08-18 DIAGNOSIS — E291 Testicular hypofunction: Secondary | ICD-10-CM | POA: Diagnosis not present

## 2017-08-18 DIAGNOSIS — R69 Illness, unspecified: Secondary | ICD-10-CM | POA: Diagnosis not present

## 2017-08-23 DIAGNOSIS — H903 Sensorineural hearing loss, bilateral: Secondary | ICD-10-CM | POA: Diagnosis not present

## 2017-08-24 DIAGNOSIS — H25013 Cortical age-related cataract, bilateral: Secondary | ICD-10-CM | POA: Diagnosis not present

## 2017-08-24 DIAGNOSIS — H2513 Age-related nuclear cataract, bilateral: Secondary | ICD-10-CM | POA: Diagnosis not present

## 2017-08-24 DIAGNOSIS — H35033 Hypertensive retinopathy, bilateral: Secondary | ICD-10-CM | POA: Diagnosis not present

## 2017-08-24 DIAGNOSIS — E119 Type 2 diabetes mellitus without complications: Secondary | ICD-10-CM | POA: Diagnosis not present

## 2017-10-25 DIAGNOSIS — R69 Illness, unspecified: Secondary | ICD-10-CM | POA: Diagnosis not present

## 2018-02-06 DIAGNOSIS — I10 Essential (primary) hypertension: Secondary | ICD-10-CM | POA: Diagnosis not present

## 2018-02-06 DIAGNOSIS — E7800 Pure hypercholesterolemia, unspecified: Secondary | ICD-10-CM | POA: Diagnosis not present

## 2018-02-06 DIAGNOSIS — I700 Atherosclerosis of aorta: Secondary | ICD-10-CM | POA: Diagnosis not present

## 2018-02-06 DIAGNOSIS — E291 Testicular hypofunction: Secondary | ICD-10-CM | POA: Diagnosis not present

## 2018-02-06 DIAGNOSIS — R69 Illness, unspecified: Secondary | ICD-10-CM | POA: Diagnosis not present

## 2018-02-06 DIAGNOSIS — E039 Hypothyroidism, unspecified: Secondary | ICD-10-CM | POA: Diagnosis not present

## 2018-02-06 DIAGNOSIS — R7303 Prediabetes: Secondary | ICD-10-CM | POA: Diagnosis not present

## 2018-04-03 ENCOUNTER — Encounter: Payer: Self-pay | Admitting: Endocrinology

## 2018-04-03 ENCOUNTER — Ambulatory Visit: Payer: Medicare HMO | Admitting: Endocrinology

## 2018-04-03 DIAGNOSIS — R7989 Other specified abnormal findings of blood chemistry: Secondary | ICD-10-CM

## 2018-04-03 LAB — FOLLICLE STIMULATING HORMONE: FSH: 24.7 m[IU]/mL — AB (ref 1.4–18.1)

## 2018-04-03 LAB — LUTEINIZING HORMONE: LH: 6.59 m[IU]/mL (ref 1.50–9.30)

## 2018-04-03 LAB — IBC PANEL
IRON: 92 ug/dL (ref 42–165)
SATURATION RATIOS: 24.2 % (ref 20.0–50.0)
TRANSFERRIN: 271 mg/dL (ref 212.0–360.0)

## 2018-04-03 NOTE — Progress Notes (Signed)
Subjective:    Patient ID: Norman Lick., male    DOB: 1951-02-24, 67 y.o.   MRN: [id number]  HPI Pt is referred by Dr Kevan Ny, for hypogonadism.  Pt reports he had puberty at the normal age.  He has 1 biological child.  He says he has never taken illicit androgens.  He was found to have low testosterone in 2017.  He took depo-testosterone x 6 mos.  He says blood test did not normalize.  He then took clomid, 25 mg qd, x 4 months.  Again, the blood test did not normalize.  He has been on no medication for this x 1 year.  does not take antiandrogens.  He denies any h/o infertility, XRT, or genital infection.  He has never had surgery, or a serious injury to the head or genital area. He has no h/o sleep apnea or DVT.   He does not consume alcohol excessively.  He has moderate inability to ejaculate, and assoc fatigue.  He took percocet x 18 mos, but none since early 2018.    Past Medical History:  Diagnosis Date  . Acne   . Anxiety    takes Xanax daily as needed  . Arthritis   . Diabetes mellitus without complication (HCC)    takes Metformin daily-per pt prediabetes  . History of kidney stones   . Hyperlipidemia    takes Pravastatin daily  . Hypertension    takes Amlodipine and Valsartan-HCTZ daily  . Hypothyroidism    takes Synthroid daily  . Inguinal hernia    left side  . Internal hemorrhoids   . Joint pain   . Rosacea     Past Surgical History:  Procedure Laterality Date  . COLONOSCOPY    . LITHOTRIPSY  09/2015  . LUMBAR LAMINECTOMY WITH COFLEX 1 LEVEL N/A 07/07/2015   Procedure: LUMBAR FOUR-FIVE LUMBAR LAMINECTOMY WITH COFLEX;  Surgeon: Tia Alert, MD;  Location: [facility];  Service: Neurosurgery;  Laterality: N/A;  . TONSILLECTOMY    . 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 Marital  status: Married    Spouse name: Not on file  . Number of children: Not on file  . Years of education: Not on file  . Highest education level: Not on file  Occupational History  . Not on file  Social Needs  . Financial resource strain: Not on file  . Food insecurity:    Worry: Not on file    Inability: Not on file  . Transportation needs:    Medical: Not on file    Non-medical: Not on file  Tobacco Use  . Smoking status: Former Games developer  . Smokeless tobacco: Never Used  . Tobacco comment: quit smoking in 1991  Substance and Sexual Activity  . Alcohol use: No  . Drug use: No  . Sexual activity: Not on file  Lifestyle  . Physical activity:    Days per week: Not on file    Minutes per session: Not on file  . Stress: Not on file  Relationships  . Social connections:    Talks on phone: Not on file    Gets together: Not on file    Attends religious service: Not on file    Active member of club or organization: Not on file    Attends meetings  of clubs or organizations: Not on file    Relationship status: Not on file  . Intimate partner violence:    Fear of current or ex partner: Not on file    Emotionally abused: Not on file    Physically abused: Not on file    Forced sexual activity: Not on file  Other Topics Concern  . Not on file  Social History Narrative  . Not on file    Current Outpatient Medications on File Prior to Visit  Medication Sig Dispense Refill  . ALPRAZolam (XANAX) 0.5 MG tablet Take 0.5 mg by mouth at bedtime as needed for anxiety.    Marland Kitchen amLODipine (NORVASC) 5 MG tablet Take 5 mg by mouth 2 (two) times daily.    Marland Kitchen aspirin EC 81 MG tablet Take 81 mg by mouth daily.    . diclofenac (VOLTAREN) 75 MG EC tablet Take 75 mg by mouth 2 (two) times daily.    Marland Kitchen doxycycline (VIBRA-TABS) 100 MG tablet Take 100 mg by mouth daily as needed (for adult acne).    . hydrochlorothiazide (HYDRODIURIL) 25 MG tablet Take 25 mg by mouth daily.    Marland Kitchen levothyroxine (SYNTHROID,  LEVOTHROID) 100 MCG tablet Take 100 mcg by mouth daily before breakfast.    . metFORMIN (GLUCOPHAGE) 500 MG tablet Take 500 mg by mouth daily with breakfast.    . Multiple Vitamins-Minerals (MULTIVITAMIN WITH MINERALS) tablet Take 1 tablet by mouth daily.    Marland Kitchen olmesartan (BENICAR) 40 MG tablet Take 40 mg by mouth daily.    . pravastatin (PRAVACHOL) 20 MG tablet Take 20 mg by mouth at bedtime.    . valACYclovir (VALTREX) 1000 MG tablet Take 500 mg by mouth daily.    . vitamin B-12 (CYANOCOBALAMIN) 500 MCG tablet Take 500 mcg by mouth daily.     No current facility-administered medications on file prior to visit.     Allergies  Allergen Reactions  . Tramadol Itching and Rash    Family History  Problem Relation Age of Onset  . Hypertension Father   . Cancer Maternal Grandfather   . Hypertension Paternal Grandmother   . Diabetes Paternal Grandmother   . Other Neg Hx        hypogonadism    BP 132/74   Pulse 67   Temp 98.2 F (36.8 C) (Oral)   Resp 16   Wt 211 lb (95.7 kg)   SpO2 97%   BMI 31.16 kg/m   Review of Systems denies depression, numbness, decreased urinary stream, gynecomastia, muscle weakness, fever, headache, easy bruising, sob, rash, blurry vision, rhinorrhea, chest pain.  He has ED sxs.  He has gained weight.      Objective:   Physical Exam VS: see vs page GEN: no distress HEAD: head: no deformity eyes: no periorbital swelling, no proptosis external nose and ears are normal mouth: no lesion seen NECK: supple, thyroid is not enlarged CHEST WALL: no deformity LUNGS: clear to auscultation BREASTS:  No gynecomastia CV: reg rate and rhythm, no murmur ABD: abdomen is soft, nontender.  no hepatosplenomegaly.  not distended.  no hernia GENITALIA:  Normal male.   MUSCULOSKELETAL: muscle bulk and strength are grossly normal.  no obvious joint swelling.  gait is normal and steady EXTEMITIES: no deformity.  no ulcer on the feet.  feet are of normal color and temp.   Trace bilat leg edema.  PULSES: dorsalis pedis intact bilat.  no carotid bruit NEURO:  cn 2-12 grossly intact, except for decreased hearing.  readily moves all 4's.  sensation is intact to touch on the feet SKIN:  Normal texture and temperature.  No rash or suspicious lesion is visible.  Normal male hair distribution.   NODES:  None palpable at the neck PSYCH: alert, well-oriented.  Does not appear anxious nor depressed.    I have reviewed outside records, and summarized: Pt was noted to have low testosterone, and referred here.  Other probs addressed were hyperglycemia, HTN, anxiety, and dyslipidemia.  Lab Results  Component Value Date   TESTOSTERONE 154 (L) 04/03/2018       Assessment & Plan:  Hypogonadism, new to me.  uncertain etiology  Patient Instructions  blood tests are requested for you today.  We'll let you know about the results. Based on the results, we should try the clomiphene pill (1/2 pill per day) again. If that doesn't help, we'll go to the gel. Testosterone treatment has risks, including increased or decreased fertility (depending on the type of treatment), hair loss, prostate cancer, benign prostate enlargement, blood clots, liver problems, lower hdl ("good cholesterol"), polycythemia (opposite of anemia), sleep apnea, and behavior changes.

## 2018-04-03 NOTE — Patient Instructions (Addendum)
blood tests are requested for you today.  We'll let you know about the results. Based on the results, we should try the clomiphene pill (1/2 pill per day) again. If that doesn't help, we'll go to the gel. Testosterone treatment has risks, including increased or decreased fertility (depending on the type of treatment), hair loss, prostate cancer, benign prostate enlargement, blood clots, liver problems, lower hdl ("good cholesterol"), polycythemia (opposite of anemia), sleep apnea, and behavior changes.

## 2018-04-04 LAB — TESTOSTERONE,FREE AND TOTAL
TESTOSTERONE FREE: 4.2 pg/mL — AB (ref 6.6–18.1)
Testosterone: 154 ng/dL — ABNORMAL LOW (ref 264–916)

## 2018-04-04 LAB — PROLACTIN: PROLACTIN: 5.9 ng/mL (ref 2.0–18.0)

## 2018-04-04 MED ORDER — TESTOSTERONE 12.5 MG/ACT (1%) TD GEL: 1.0000 | Freq: Every day | TRANSDERMAL | 0 refills | Status: DC

## 2018-04-19 ENCOUNTER — Telehealth: Payer: Self-pay | Admitting: Endocrinology

## 2018-04-19 ENCOUNTER — Other Ambulatory Visit (INDEPENDENT_AMBULATORY_CARE_PROVIDER_SITE_OTHER): Payer: Medicare HMO

## 2018-04-19 DIAGNOSIS — R7989 Other specified abnormal findings of blood chemistry: Secondary | ICD-10-CM | POA: Diagnosis not present

## 2018-04-19 NOTE — Telephone Encounter (Signed)
Testosterone 12.5 MG/ACT (1%) GEL  Patient stated hes been on this medication for about  2 weeks.  He stated that he feels about the same and sometimes worse and he would like to doctor to be aware,

## 2018-04-19 NOTE — Telephone Encounter (Signed)
Please come in to have your blood drawn.

## 2018-04-19 NOTE — Telephone Encounter (Signed)
Fyi.

## 2018-04-20 ENCOUNTER — Other Ambulatory Visit: Payer: Self-pay | Admitting: Endocrinology

## 2018-04-20 DIAGNOSIS — R7989 Other specified abnormal findings of blood chemistry: Secondary | ICD-10-CM

## 2018-04-20 LAB — TESTOSTERONE,FREE AND TOTAL
Testosterone, Free: 5.6 pg/mL — ABNORMAL LOW (ref 6.6–18.1)
Testosterone: 136 ng/dL — ABNORMAL LOW (ref 264–916)

## 2018-04-20 NOTE — Telephone Encounter (Signed)
Attempted to call pt and vm has a different # than what was dialed, will try again later.

## 2018-05-02 DIAGNOSIS — R69 Illness, unspecified: Secondary | ICD-10-CM | POA: Diagnosis not present

## 2018-05-03 ENCOUNTER — Other Ambulatory Visit (INDEPENDENT_AMBULATORY_CARE_PROVIDER_SITE_OTHER): Payer: Medicare HMO

## 2018-05-03 DIAGNOSIS — R7989 Other specified abnormal findings of blood chemistry: Secondary | ICD-10-CM | POA: Diagnosis not present

## 2018-05-04 ENCOUNTER — Encounter: Payer: Self-pay | Admitting: Endocrinology

## 2018-05-04 ENCOUNTER — Other Ambulatory Visit: Payer: Self-pay | Admitting: Endocrinology

## 2018-05-04 DIAGNOSIS — R7989 Other specified abnormal findings of blood chemistry: Secondary | ICD-10-CM

## 2018-05-04 LAB — TESTOSTERONE,FREE AND TOTAL
TESTOSTERONE FREE: 5.7 pg/mL — AB (ref 6.6–18.1)
Testosterone: 120 ng/dL — ABNORMAL LOW (ref 264–916)

## 2018-05-16 DIAGNOSIS — R69 Illness, unspecified: Secondary | ICD-10-CM | POA: Diagnosis not present

## 2018-05-17 ENCOUNTER — Other Ambulatory Visit (INDEPENDENT_AMBULATORY_CARE_PROVIDER_SITE_OTHER): Payer: Medicare HMO

## 2018-05-17 DIAGNOSIS — R7989 Other specified abnormal findings of blood chemistry: Secondary | ICD-10-CM

## 2018-05-19 ENCOUNTER — Encounter: Payer: Self-pay | Admitting: Endocrinology

## 2018-05-19 LAB — TESTOSTERONE,FREE AND TOTAL
Testosterone, Free: 2.4 pg/mL — ABNORMAL LOW (ref 6.6–18.1)
Testosterone: 45 ng/dL — ABNORMAL LOW (ref 264–916)

## 2018-05-20 ENCOUNTER — Other Ambulatory Visit: Payer: Self-pay | Admitting: Endocrinology

## 2018-05-20 DIAGNOSIS — R7989 Other specified abnormal findings of blood chemistry: Secondary | ICD-10-CM

## 2018-05-25 ENCOUNTER — Other Ambulatory Visit: Payer: Self-pay | Admitting: Endocrinology

## 2018-05-26 ENCOUNTER — Other Ambulatory Visit: Payer: Self-pay | Admitting: Endocrinology

## 2018-05-26 MED ORDER — TESTOSTERONE 12.5 MG/ACT (1%) TD GEL
4.0000 | Freq: Every day | TRANSDERMAL | 0 refills | Status: DC
Start: 2018-05-26 — End: 2019-03-30

## 2018-05-27 IMAGING — CR DG HIP (WITH OR WITHOUT PELVIS) 1V PORT*L*
2 series · 2 of 2 positions shown · non-contrast
Comparison: 09/01/2015

CLINICAL DATA: Total hip arthroplasty

EXAM:
DG HIP (WITH OR WITHOUT PELVIS) 1V PORT LEFT

[AP]
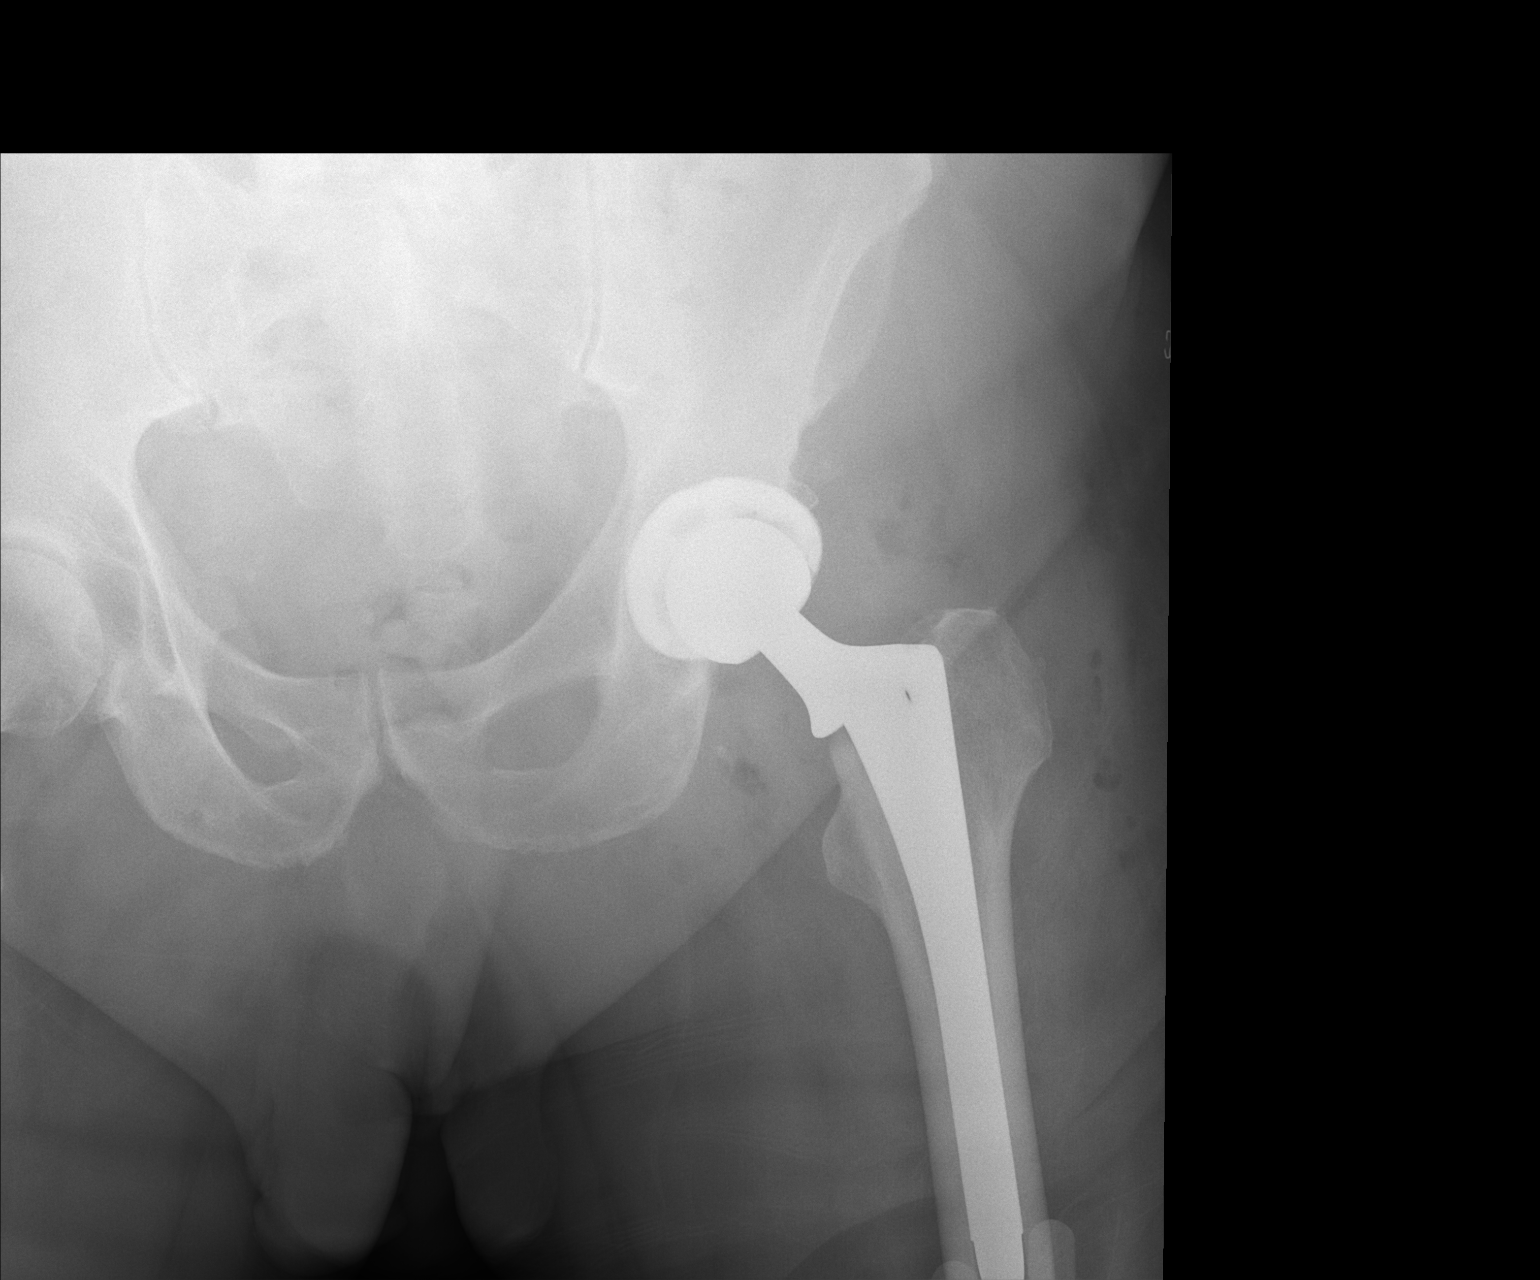

[xtable lateral]
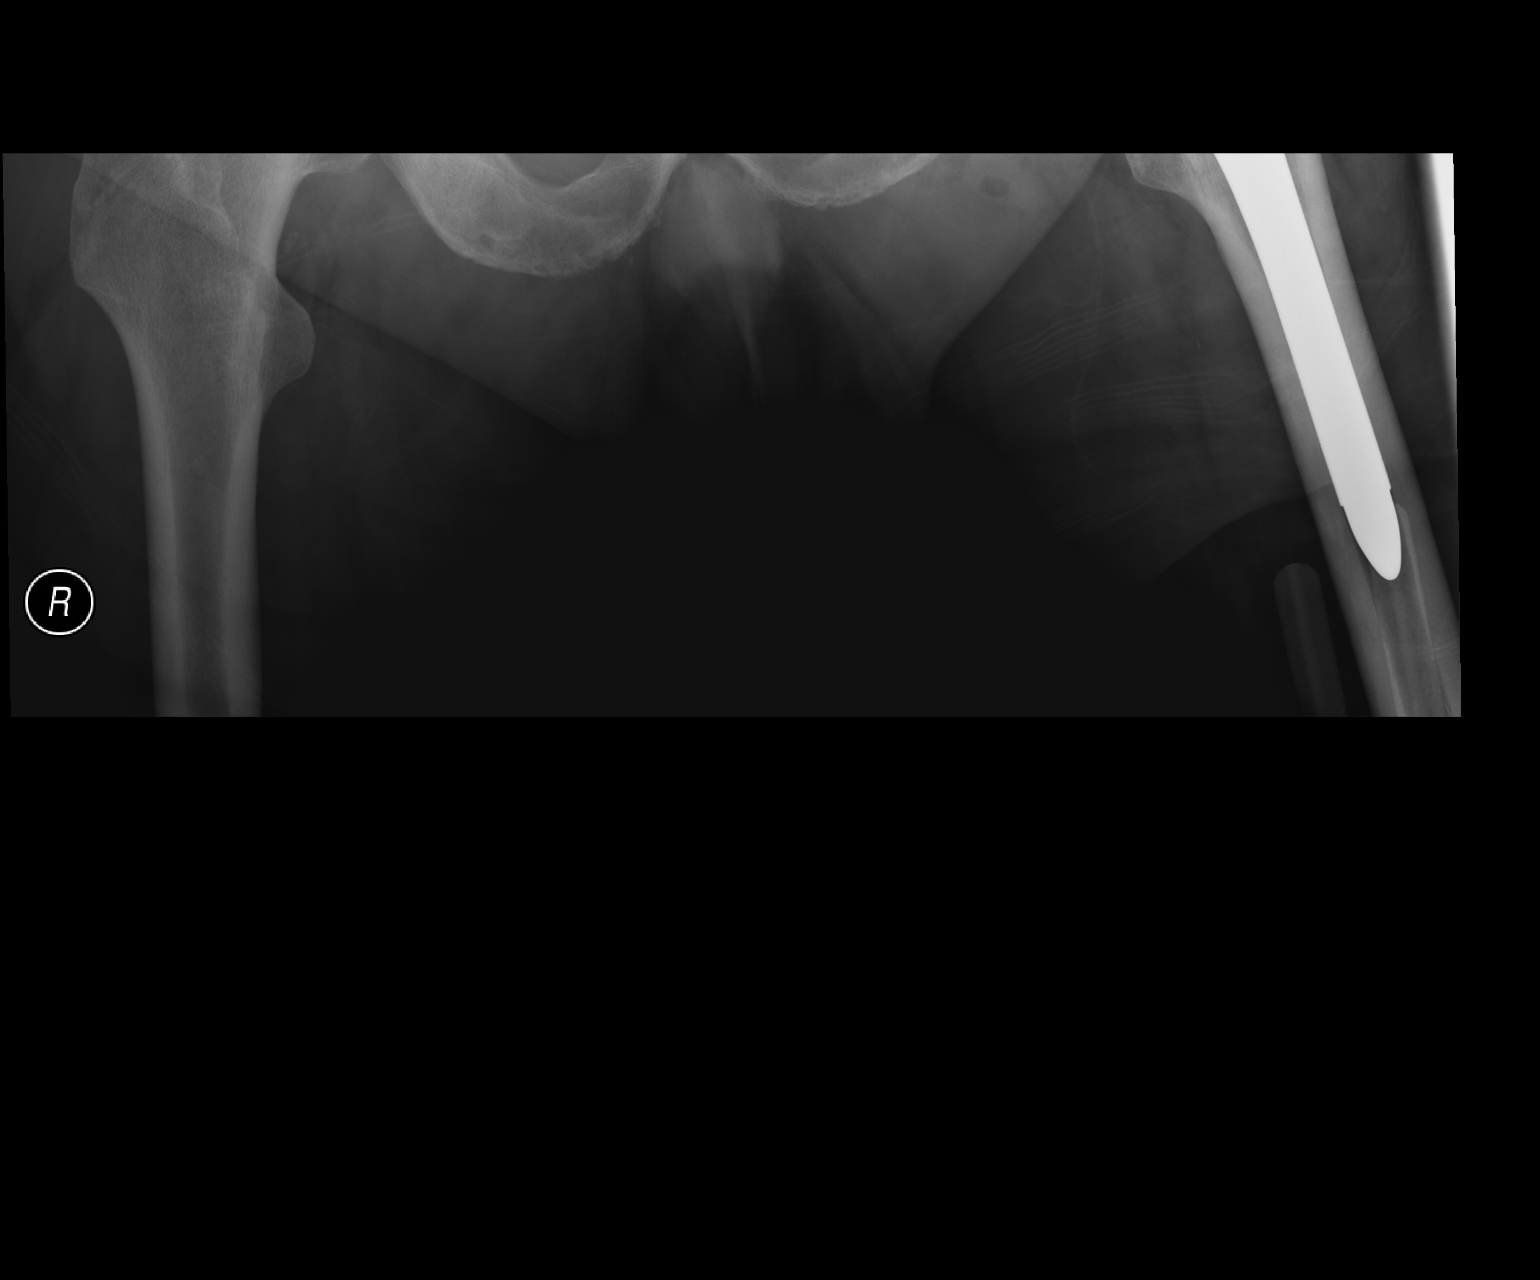

[2 of 2 positions shown; findings below may reference images not displayed]

FINDINGS: Changes of left hip replacement. No hardware or bony complicating
feature. Normal AP alignment.
IMPRESSION: Left hip replacement.  No complicating feature.

## 2018-05-31 ENCOUNTER — Telehealth: Payer: Self-pay | Admitting: Endocrinology

## 2018-05-31 NOTE — Telephone Encounter (Signed)
He has a future ordered for Testosterone Free and Total written on 05/20/18. He can be scheduled for a lab appt. Thanks

## 2018-05-31 NOTE — Telephone Encounter (Signed)
Patient is calling stating Dr. Loanne Drilling discussed him coming in 2 weeks for labs but I am not seeing orders. I stated to patient that orders have to be placed before I make appointment. Please Advise. Ph # [phone]

## 2018-06-05 ENCOUNTER — Other Ambulatory Visit: Payer: Medicare HMO

## 2018-06-05 DIAGNOSIS — R7989 Other specified abnormal findings of blood chemistry: Secondary | ICD-10-CM

## 2018-06-07 ENCOUNTER — Encounter: Payer: Self-pay | Admitting: Endocrinology

## 2018-06-07 LAB — TESTOSTERONE,FREE AND TOTAL
TESTOSTERONE FREE: 15.5 pg/mL (ref 6.6–18.1)
TESTOSTERONE: 517 ng/dL (ref 264–916)

## 2018-06-27 ENCOUNTER — Other Ambulatory Visit: Payer: Self-pay | Admitting: Endocrinology

## 2018-06-27 NOTE — Telephone Encounter (Signed)
Please refill if appropriate

## 2018-06-27 NOTE — Telephone Encounter (Signed)
Ok, I sent rx

## 2018-07-02 ENCOUNTER — Encounter: Payer: Self-pay | Admitting: Endocrinology

## 2018-07-03 NOTE — Telephone Encounter (Signed)
Please advise 

## 2018-08-29 DIAGNOSIS — H2513 Age-related nuclear cataract, bilateral: Secondary | ICD-10-CM | POA: Diagnosis not present

## 2018-08-29 DIAGNOSIS — H35361 Drusen (degenerative) of macula, right eye: Secondary | ICD-10-CM | POA: Diagnosis not present

## 2018-08-29 DIAGNOSIS — H25013 Cortical age-related cataract, bilateral: Secondary | ICD-10-CM | POA: Diagnosis not present

## 2018-08-29 DIAGNOSIS — H35033 Hypertensive retinopathy, bilateral: Secondary | ICD-10-CM | POA: Diagnosis not present

## 2018-08-29 DIAGNOSIS — E119 Type 2 diabetes mellitus without complications: Secondary | ICD-10-CM | POA: Diagnosis not present

## 2018-09-01 DIAGNOSIS — R7303 Prediabetes: Secondary | ICD-10-CM | POA: Diagnosis not present

## 2018-09-01 DIAGNOSIS — I10 Essential (primary) hypertension: Secondary | ICD-10-CM | POA: Diagnosis not present

## 2018-09-01 DIAGNOSIS — Z0000 Encounter for general adult medical examination without abnormal findings: Secondary | ICD-10-CM | POA: Diagnosis not present

## 2018-09-01 DIAGNOSIS — N5201 Erectile dysfunction due to arterial insufficiency: Secondary | ICD-10-CM | POA: Diagnosis not present

## 2018-09-01 DIAGNOSIS — Z1211 Encounter for screening for malignant neoplasm of colon: Secondary | ICD-10-CM | POA: Diagnosis not present

## 2018-09-01 DIAGNOSIS — R69 Illness, unspecified: Secondary | ICD-10-CM | POA: Diagnosis not present

## 2018-09-01 DIAGNOSIS — E039 Hypothyroidism, unspecified: Secondary | ICD-10-CM | POA: Diagnosis not present

## 2018-09-01 DIAGNOSIS — E7800 Pure hypercholesterolemia, unspecified: Secondary | ICD-10-CM | POA: Diagnosis not present

## 2018-09-01 DIAGNOSIS — E291 Testicular hypofunction: Secondary | ICD-10-CM | POA: Diagnosis not present

## 2018-09-01 DIAGNOSIS — Z125 Encounter for screening for malignant neoplasm of prostate: Secondary | ICD-10-CM | POA: Diagnosis not present

## 2018-12-21 ENCOUNTER — Other Ambulatory Visit: Payer: Self-pay | Admitting: Endocrinology

## 2018-12-22 NOTE — Telephone Encounter (Signed)
Please refill if appropriate

## 2019-01-24 ENCOUNTER — Other Ambulatory Visit: Payer: Self-pay | Admitting: Endocrinology

## 2019-01-24 NOTE — Telephone Encounter (Signed)
Please review Dr. Cordelia Pen request to refill x1.

## 2019-01-24 NOTE — Telephone Encounter (Signed)
Please ask another dr to refill x 1.  Thank you.

## 2019-01-29 DIAGNOSIS — I700 Atherosclerosis of aorta: Secondary | ICD-10-CM | POA: Diagnosis not present

## 2019-01-29 DIAGNOSIS — E291 Testicular hypofunction: Secondary | ICD-10-CM | POA: Diagnosis not present

## 2019-01-29 DIAGNOSIS — E7800 Pure hypercholesterolemia, unspecified: Secondary | ICD-10-CM | POA: Diagnosis not present

## 2019-01-29 DIAGNOSIS — E039 Hypothyroidism, unspecified: Secondary | ICD-10-CM | POA: Diagnosis not present

## 2019-01-29 DIAGNOSIS — R5382 Chronic fatigue, unspecified: Secondary | ICD-10-CM | POA: Diagnosis not present

## 2019-01-29 DIAGNOSIS — R7303 Prediabetes: Secondary | ICD-10-CM | POA: Diagnosis not present

## 2019-01-29 DIAGNOSIS — I10 Essential (primary) hypertension: Secondary | ICD-10-CM | POA: Diagnosis not present

## 2019-01-30 DIAGNOSIS — E7800 Pure hypercholesterolemia, unspecified: Secondary | ICD-10-CM | POA: Diagnosis not present

## 2019-01-30 DIAGNOSIS — R7303 Prediabetes: Secondary | ICD-10-CM | POA: Diagnosis not present

## 2019-01-30 DIAGNOSIS — E039 Hypothyroidism, unspecified: Secondary | ICD-10-CM | POA: Diagnosis not present

## 2019-01-30 DIAGNOSIS — R5382 Chronic fatigue, unspecified: Secondary | ICD-10-CM | POA: Diagnosis not present

## 2019-01-30 DIAGNOSIS — E291 Testicular hypofunction: Secondary | ICD-10-CM | POA: Diagnosis not present

## 2019-01-30 DIAGNOSIS — I10 Essential (primary) hypertension: Secondary | ICD-10-CM | POA: Diagnosis not present

## 2019-02-06 ENCOUNTER — Encounter: Payer: Self-pay | Admitting: Endocrinology

## 2019-02-06 NOTE — Telephone Encounter (Signed)
Please advise 

## 2019-02-06 NOTE — Telephone Encounter (Signed)
Please review and advise.

## 2019-02-07 ENCOUNTER — Ambulatory Visit (INDEPENDENT_AMBULATORY_CARE_PROVIDER_SITE_OTHER): Payer: Medicare HMO | Admitting: Endocrinology

## 2019-02-07 ENCOUNTER — Encounter: Payer: Self-pay | Admitting: Endocrinology

## 2019-02-07 ENCOUNTER — Other Ambulatory Visit: Payer: Self-pay

## 2019-02-07 VITALS — BP 180/90 | HR 74 | Ht 69.0 in | Wt 206.0 lb

## 2019-02-07 DIAGNOSIS — R7989 Other specified abnormal findings of blood chemistry: Secondary | ICD-10-CM | POA: Diagnosis not present

## 2019-02-07 DIAGNOSIS — I499 Cardiac arrhythmia, unspecified: Secondary | ICD-10-CM | POA: Diagnosis not present

## 2019-02-07 NOTE — Progress Notes (Signed)
Subjective:    Patient ID: Norman Lick., male    DOB: [birth year], 68 y.o.   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  He takes 4 pumps per day.  He takes as rx'ed.  He has moderate muscle weakness throughout the body, and assoc fatigue.  He wants to increase the androgel dosage.   Past Medical History:  Diagnosis Date  . Acne   . Anxiety    takes Xanax daily as needed  . Arthritis   . Diabetes mellitus without complication (HCC)    takes Metformin daily-per pt prediabetes  . History of kidney stones   . Hyperlipidemia    takes Pravastatin daily  . Hypertension    takes Amlodipine and Valsartan-HCTZ daily  . Hypothyroidism    takes Synthroid daily  . Inguinal hernia    left side  . Internal hemorrhoids   . Joint pain   . Rosacea     Past Surgical History:  Procedure Laterality Date  . COLONOSCOPY    . LITHOTRIPSY  09/2015  . LUMBAR LAMINECTOMY WITH COFLEX 1 LEVEL N/A 07/07/2015   Procedure: LUMBAR FOUR-FIVE LUMBAR LAMINECTOMY WITH COFLEX;  Surgeon: Tia Alert, MD;  Location: [facility];  Service: Neurosurgery;  Laterality: N/A;  . TONSILLECTOMY    . 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 Marital status: Married    Spouse name: Not on file  . Number of children: Not on file  . Years of education: Not on file  . Highest education level: Not on file  Occupational History  . Not on file  Social Needs  .  Financial resource strain: Not on file  . Food insecurity    Worry: Not on file    Inability: Not on file  . Transportation needs    Medical: Not on file    Non-medical: Not on file  Tobacco Use  . Smoking status: Former Games developer  . Smokeless tobacco: Never Used  . Tobacco comment: quit smoking in 1991  Substance and Sexual Activity  . Alcohol use: No  . Drug use: No  . Sexual activity: Not on file  Lifestyle  . Physical activity    Days per week: Not on file    Minutes per session: Not on file  . Stress: Not on file  Relationships  . Social Musician on phone: Not on file    Gets together: Not on file    Attends religious service: Not on file    Active member of club or organization: Not on file    Attends meetings of clubs or organizations: Not on file    Relationship status: Not on file  . Intimate partner violence    Fear of current or ex partner: Not on file    Emotionally abused: Not on file    Physically abused: Not on file    Forced sexual activity: Not  on file  Other Topics Concern  . Not on file  Social History Narrative  . Not on file    Current Outpatient Medications on File Prior to Visit  Medication Sig Dispense Refill  . ALPRAZolam (XANAX) 0.5 MG tablet Take 0.5 mg by mouth at bedtime as needed for anxiety.    Marland Kitchen amLODipine (NORVASC) 5 MG tablet Take 5 mg by mouth 2 (two) times daily.    Marland Kitchen aspirin EC 81 MG tablet Take 81 mg by mouth daily.    . diclofenac (VOLTAREN) 75 MG EC tablet Take 75 mg by mouth 2 (two) times daily.    Marland Kitchen doxycycline (VIBRA-TABS) 100 MG tablet Take 100 mg by mouth daily as needed (for adult acne).    . hydrochlorothiazide (HYDRODIURIL) 25 MG tablet Take 25 mg by mouth daily.    Marland Kitchen levothyroxine (SYNTHROID, LEVOTHROID) 100 MCG tablet Take 100 mcg by mouth daily before breakfast.    . metFORMIN (GLUCOPHAGE) 500 MG tablet Take 500 mg by mouth daily with breakfast.    . Multiple Vitamins-Minerals (MULTIVITAMIN WITH MINERALS)  tablet Take 1 tablet by mouth daily.    Marland Kitchen olmesartan (BENICAR) 40 MG tablet Take 40 mg by mouth daily.    . pravastatin (PRAVACHOL) 20 MG tablet Take 20 mg by mouth at bedtime.    . Testosterone 12.5 MG/ACT (1%) GEL Apply 4 Pump topically daily. 150 g 0  . Testosterone 12.5 MG/ACT (1%) GEL APPLY 4 PUMPS (5GM) ONCE A DAY. 150 g 5  . Testosterone 12.5 MG/ACT (1%) GEL APPLY 4 PUMPS (5GM) ONCE A DAY. 150 g 0  . valACYclovir (VALTREX) 1000 MG tablet Take 500 mg by mouth daily.    . vitamin B-12 (CYANOCOBALAMIN) 500 MCG tablet Take 500 mcg by mouth daily.     No current facility-administered medications on file prior to visit.     Allergies  Allergen Reactions  . Tramadol Itching and Rash    Family History  Problem Relation Age of Onset  . Hypertension Father   . Cancer Maternal Grandfather   . Hypertension Paternal Grandmother   . Diabetes Paternal Grandmother   . Other Neg Hx        hypogonadism    BP (!) 180/90 (BP Location: Left Arm, Patient Position: Sitting, Cuff Size: Large)   Pulse 74   Ht 5\' 9"  (1.753 m)   Wt 206 lb (93.4 kg)   SpO2 96%   BMI 30.42 kg/m   Review of Systems Denies decreased urinary stream and sob.      Objective:   Physical Exam VITAL SIGNS:  See vs page.  GENERAL: no distress.  Ext: 1+ bilat leg edema.    outside test results are reviewed: Testosterone=275 (free = 9.3) PSA=2.9 Hb=15.3    Assessment & Plan:  HTN: is noted today Hypogonadism: well-controlled Fatigue: I told pt the only way this could be testosterone-related is if rx has worsened unknown sleep apnea  Patient Instructions  Your blood pressure is high today.  Please see your primary care provider soon, to have it rechecked.   Please continue the same testosterone. I agree with your plan to ask Dr Hyman Hopes about having the sleep apnea test Testosterone treatment has risks, including increased or decreased fertility (depending on the type of treatment), hair loss, prostate cancer,  benign prostate enlargement, blood clots, liver problems, lower hdl ("good cholesterol"), polycythemia (opposite of anemia), sleep apnea, and behavior changes.

## 2019-02-07 NOTE — Patient Instructions (Addendum)
Your blood pressure is high today.  Please see your primary care provider soon, to have it rechecked.   Please continue the same testosterone. I agree with your plan to ask Dr Justin Mend about having the sleep apnea test Testosterone treatment has risks, including increased or decreased fertility (depending on the type of treatment), hair loss, prostate cancer, benign prostate enlargement, blood clots, liver problems, lower hdl ("good cholesterol"), polycythemia (opposite of anemia), sleep apnea, and behavior changes.

## 2019-02-13 ENCOUNTER — Other Ambulatory Visit: Payer: Self-pay

## 2019-02-13 ENCOUNTER — Ambulatory Visit: Payer: Medicare HMO

## 2019-02-13 ENCOUNTER — Ambulatory Visit: Payer: Medicare HMO | Admitting: Cardiology

## 2019-02-13 ENCOUNTER — Encounter: Payer: Self-pay | Admitting: Cardiology

## 2019-02-13 VITALS — BP 162/81 | HR 68 | Ht 67.0 in | Wt 205.8 lb

## 2019-02-13 DIAGNOSIS — I491 Atrial premature depolarization: Secondary | ICD-10-CM

## 2019-02-13 DIAGNOSIS — E7800 Pure hypercholesterolemia, unspecified: Secondary | ICD-10-CM

## 2019-02-13 DIAGNOSIS — I10 Essential (primary) hypertension: Secondary | ICD-10-CM

## 2019-02-13 DIAGNOSIS — R0683 Snoring: Secondary | ICD-10-CM | POA: Diagnosis not present

## 2019-02-13 DIAGNOSIS — R5383 Other fatigue: Secondary | ICD-10-CM

## 2019-02-13 DIAGNOSIS — R5381 Other malaise: Secondary | ICD-10-CM

## 2019-02-13 DIAGNOSIS — I498 Other specified cardiac arrhythmias: Secondary | ICD-10-CM | POA: Diagnosis not present

## 2019-02-13 MED ORDER — CARVEDILOL 6.25 MG PO TABS: 6.2500 mg | ORAL_TABLET | Freq: Two times a day (BID) | ORAL | 3 refills | Status: DC

## 2019-02-13 NOTE — Progress Notes (Signed)
Primary Physician:  Shirlean Mylar, MD   Patient ID: Norman Hudson., male    DOB: 1951/06/29, 68 y.o.   MRN: [id number]  Subjective:    Chief Complaint  Patient presents with  . New Patient (Initial Visit)  . Palpitations    HPI: Norman Hudson.  is a 68 y.o. male  with prediabetes, hypertension, hyperlipidemia, chronically low testosterone, hypothyroidism, former smoker with 15 pack year history, anxiety, osteoarthritis, and rosacea referred to [place] for evaluation of irregular heart beat by Dr. Hyman Hopes.  Patient states about 2 months ago, he noted fatigue even when he first woke up. Patient figured was related to thyroid or testosterone. Has mildly low testosterone levels that her chronic. States symptoms persisted since. He was evaluated by Dr. Everardo All and noted markedly elevated blood pressure in the 180/90's. He then was evaluated by PCP, who also noted the elevated blood pressure and noted irregular heart beat on EKG. He has been feeling better over the last few weeks. Denies any chest pain, dyspnea on exertion, PND, orthopnea, or symptoms of claudication. He does have mild leg edema that is chronic. He does snore and states that he has bizarre dreams at night. States he had a sleep study several years ago that was negative. States that he and his wife actually sleep in separate rooms for the last 5 years.   No alcohol use or illicit drug use. Patient is mostly sedentary. 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   Past Medical History:  Diagnosis Date  . Acne   . Anxiety    takes Xanax daily as needed  . Arthritis   . Diabetes mellitus without complication (HCC)    takes Metformin daily-per pt prediabetes  . History of kidney stones   . Hyperlipidemia    takes Pravastatin daily  . Hypertension    takes Amlodipine and Valsartan-HCTZ daily  . Hypothyroidism    takes Synthroid daily  . Inguinal hernia    left side  . Internal  hemorrhoids   . Joint pain   . Rosacea     Past Surgical History:  Procedure Laterality Date  . COLONOSCOPY    . LITHOTRIPSY  09/2015  . LUMBAR LAMINECTOMY WITH COFLEX 1 LEVEL N/A 07/07/2015   Procedure: LUMBAR FOUR-FIVE LUMBAR LAMINECTOMY WITH COFLEX;  Surgeon: Tia Alert, MD;  Location: [facility];  Service: Neurosurgery;  Laterality: N/A;  . TONSILLECTOMY    . 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 Marital status: Married    Spouse name: Not on file  . Number of children: 2  . Years of education: Not on file  . Highest education level: Not on file  Occupational History  . Not on file  Social Needs  . Financial resource strain: Not on file  . Food insecurity    Worry: Not on file    Inability: Not on file  . Transportation needs    Medical: Not on file    Non-medical: Not on file  Tobacco Use  . Smoking status: Former Smoker    Packs/day: 1.00    Years: 15.00    Pack years: 15.00    Types: Cigarettes    Quit date: 02/12/1989    Years since quitting: 30.0  . Smokeless tobacco: Never  Used  . Tobacco comment: quit smoking in 1991  Substance and Sexual Activity  . Alcohol use: No  . Drug use: No  . Sexual activity: Not on file  Lifestyle  . Physical activity    Days per week: Not on file    Minutes per session: Not on file  . Stress: Not on file  Relationships  . Social Musician on phone: Not on file    Gets together: Not on file    Attends religious service: Not on file    Active member of club or organization: Not on file    Attends meetings of clubs or organizations: Not on file    Relationship status: Not on file  . Intimate partner violence    Fear of current or ex partner: Not on file    Emotionally abused: Not on file    Physically abused: Not on file    Forced  sexual activity: Not on file  Other Topics Concern  . Not on file  Social History Narrative  . Not on file    Review of Systems  Constitution: Positive for malaise/fatigue. Negative for decreased appetite, weight gain and weight loss.  Eyes: Negative for visual disturbance.  Cardiovascular: Negative for chest pain, claudication, dyspnea on exertion, leg swelling, orthopnea, palpitations and syncope.  Respiratory: Negative for hemoptysis and wheezing.   Endocrine: Negative for cold intolerance and heat intolerance.  Hematologic/Lymphatic: Does not bruise/bleed easily.  Skin: Negative for nail changes.  Musculoskeletal: Positive for arthritis. Negative for muscle weakness and myalgias.  Gastrointestinal: Negative for abdominal pain, change in bowel habit, nausea and vomiting.  Neurological: Negative for difficulty with concentration, dizziness, focal weakness and headaches.  Psychiatric/Behavioral: Negative for altered mental status and suicidal ideas.  All other systems reviewed and are negative.     Objective:  Blood pressure (!) 162/81, pulse 68, height 5\' 7"  (1.702 m), weight 205 lb 12.8 oz (93.4 kg), SpO2 96 %. Body mass index is 32.23 kg/m.    Physical Exam  Constitutional: He is oriented to person, place, and time. Vital signs are normal. He appears well-developed and well-nourished.  HENT:  Head: Normocephalic and atraumatic.  Neck: Normal range of motion.  Cardiovascular: Normal rate, normal heart sounds and intact distal pulses. An irregular rhythm present.  Pulses:      Femoral pulses are 2+ on the right side and 2+ on the left side.      Popliteal pulses are 1+ on the right side and 1+ on the left side.       Dorsalis pedis pulses are 1+ on the right side and 1+ on the left side.       Posterior tibial pulses are 1+ on the right side and 1+ on the left side.  Trace leg edema  Pulmonary/Chest: Effort normal and breath sounds normal. No accessory muscle usage. No  respiratory distress.  Abdominal: Soft. Bowel sounds are normal.  Musculoskeletal: Normal range of motion.  Neurological: He is alert and oriented to person, place, and time.  Skin: Skin is warm and dry.  Vitals reviewed.  Radiology: No results found.  Laboratory examination:    CMP Latest Ref Rng & Units 07/24/2016 07/12/2016 06/27/2015  Glucose 65 - 99 mg/dL 578(I) 696(E) 952(W)  BUN 6 - 20 mg/dL 18 17 41(L)  Creatinine 0.61 - 1.24 mg/dL 2.44 0.10 2.72  Sodium 135 - 145 mmol/L 137 139 139  Potassium 3.5 - 5.1 mmol/L 4.2 3.9 4.0  Chloride  101 - 111 mmol/L 101 106 105  CO2 22 - 32 mmol/L 27 23 27   Calcium 8.9 - 10.3 mg/dL 8.2(U) 9.5 9.6  Total Protein 6.5 - 8.1 g/dL - 7.0 -  Total Bilirubin 0.3 - 1.2 mg/dL - 0.9 -  Alkaline Phos 38 - 126 U/L - 49 -  AST 15 - 41 U/L - 36 -  ALT 17 - 63 U/L - 30 -   CBC Latest Ref Rng & Units 07/25/2016 07/24/2016 07/12/2016  WBC 4.0 - 10.5 K/uL 12.1(H) 11.2(H) 9.5  Hemoglobin 13.0 - 17.0 g/dL 23.5 36.1 44.3  Hematocrit 39.0 - 52.0 % 39.4 41.7 49.0  Platelets 150 - 400 K/uL 139(L) 162 170   Lipid Panel  No results found for: CHOL, TRIG, HDL, CHOLHDL, VLDL, LDLCALC, LDLDIRECT HEMOGLOBIN A1C Lab Results  Component Value Date   HGBA1C 5.8 (H) 07/12/2016   MPG 120 07/12/2016   TSH No results for input(s): TSH in the last 8760 hours.  PRN Meds:. Medications Discontinued During This Encounter  Medication Reason  . Testosterone 12.5 MG/ACT (1%) GEL Duplicate  . Testosterone 12.5 MG/ACT (1%) GEL Duplicate   Current Meds  Medication Sig  . ALPRAZolam (XANAX) 0.5 MG tablet Take 0.5 mg by mouth at bedtime as needed for anxiety.  Marland Kitchen amLODipine (NORVASC) 5 MG tablet Take 5 mg by mouth 2 (two) times daily.  Marland Kitchen aspirin EC 81 MG tablet Take 81 mg by mouth daily.  . diclofenac (VOLTAREN) 50 MG EC tablet Take 50 mg by mouth 2 (two) times daily.   Marland Kitchen doxycycline (VIBRA-TABS) 100 MG tablet Take 100 mg by mouth daily as needed (for adult acne).  .  hydrochlorothiazide (HYDRODIURIL) 25 MG tablet Take 25 mg by mouth daily.  Marland Kitchen levothyroxine (SYNTHROID, LEVOTHROID) 100 MCG tablet Take 100 mcg by mouth daily before breakfast.  . metFORMIN (GLUCOPHAGE) 500 MG tablet Take 500 mg by mouth daily with breakfast.  . Multiple Vitamins-Minerals (MULTIVITAMIN WITH MINERALS) tablet Take 1 tablet by mouth daily.  Marland Kitchen olmesartan (BENICAR) 40 MG tablet Take 40 mg by mouth daily.  . pravastatin (PRAVACHOL) 20 MG tablet Take 20 mg by mouth at bedtime.  . Testosterone 12.5 MG/ACT (1%) GEL Apply 4 Pump topically daily.  . valACYclovir HCl (VALTREX PO) Take by mouth daily. 1 gm  . vitamin B-12 (CYANOCOBALAMIN) 500 MCG tablet Take 500 mcg by mouth daily.    Cardiac Studies:     Assessment:     ICD-10-CM   1. PAC (premature atrial contraction)  I49.1 EKG 12-Lead    Cardiac event monitor    PCV ECHOCARDIOGRAM COMPLETE  2. Essential hypertension  I10 PCV ECHOCARDIOGRAM COMPLETE  3. Pure hypercholesterolemia  E78.00   4. Snoring  R06.83   5. Malaise and fatigue  R53.81    R53.83     EKG 02/13/2019: Normal sinus rhythm at 75 bpm with 2 PAC's, normal axis, borderline criteria for LVH, no evidence of ischemia.   Recommendations:   Patient is feeling better over the last few weeks. He is noted to have occasional PAC on EKG. He has multiple risk factors for A fib and given occasional PAC and fatigue, feel that this should be evaluated. Will place him on 2 week event monitor for further evaluation. I suspect his fatigue may also be contributed by markedly elevated blood pressure, will start Coreg 6.25 mg BID both for hypertension and PAC. Will also recommend echocardiogram for further evaluation of any structural abnormalities. He has symptoms suggestive of underlying  sleep apnea, and I agree with evaluation for the same. Will defer to PCP for referral for this.   No symptoms of angina. Do not feel that he needs stress testing at this point. He reports that  hyperlipidemia is borderline controlled. I do not have results, will request from PCP office for further risk stratification. I will see him back in 4 weeks for follow up after the test for further recommendations.    *I have discussed this case with Dr. Rosemary Holms and he personally examined the patient and participated in formulating the plan.*    Toniann Fail, MSN, APRN, FNP-C [facility] Office: [phone] Fax: [phone]

## 2019-02-13 NOTE — Patient Instructions (Signed)
Premature Atrial Contraction  A premature atrial contraction [facility]) is a kind of irregular heartbeat (arrhythmia). It happens when the heart beats too early and then pauses before beating again. The heart has four areas, or chambers. Normally, electrical signals spread across the heart and make all the chambers beat together. During a PAC, the upper chambers of the heart (atria) beat too early, before they have had time to fill with blood. The heartbeat pauses afterward so the heart can fill with blood for the next beat. Sometimes PAC can be a warning sign of another type of arrhythmia called atrial fibrillation. Atrial fibrillation may allow blood to pool in the atria and form clots. If a clot travels to the brain, it can cause a stroke. What are the causes? The cause of this condition is often unknown. Sometimes, this condition may be caused by heart disease or injury to the heart. What increases the risk? You are more likely to develop this condition if:  You are a child.  You are an adult who is 70 years of age or older. Episodes may be triggered by:  Caffeine.  Alcohol.  Tobacco use.  Stimulant drugs.  Some medicines or supplements.  Stress.  Heart disease. What are the signs or symptoms? Symptoms of this condition include:  A feeling that your heart skipped a beat. The first heartbeat after the "skipped" beat may feel more forceful.  A feeling that your heart is fluttering. How is this diagnosed? This condition is diagnosed based on:  Your symptoms.  A physical exam. Your health care provider may listen to your heart.  An electrocardiogram (ECG). This is a test that records the electrical impulses of the heart.  An ambulatory cardiac monitor. This device records your heartbeats for 24 hours or more. You may also have:  An echocardiogram to check for any heart conditions. This is a type of imaging test that uses sound waves (ultrasound) to make images of your heart.   Blood tests. How is this treated? Treatment depends on the frequency of your symptoms and other risk factors. Treatments may include:  Medicines (beta-blockers).  Catheter ablation. This is done to destroy the part of the heart tissue that sends abnormal signals. In some cases, treatment may not be needed for this condition. Follow these instructions at home: Lifestyle  Do not use any products that contain nicotine or tobacco, such as cigarettes, e-cigarettes, and chewing tobacco. If you need help quitting, ask your health care provider.  Exercise regularly. Ask your health care provider what type of exercise is safe for you.  Find healthy ways to manage stress.  Try to get at least 7-9 hours of sleep each night, or as much as recommended by your health care provider. Alcohol use  Do not drink alcohol if: ? Your health care provider tells you not to drink. ? You are pregnant, may be pregnant, or are planning to become pregnant. ? Alcohol triggers your episodes.  If you drink alcohol: ? Limit how much you use to:  0-1 drink a day for women.  0-2 drinks a day for men. ? Be aware of how much alcohol is in your drink. In the [place] one drink equals one 12 oz bottle of beer (355 mL), one 5 oz glass of wine (148 mL), or one 1 oz glass of hard liquor (44 mL). General instructions  Take over-the-counter and prescription medicines only as told by your health care provider.  If caffeine triggers episodes, do not eat,  drink, or use anything with caffeine in it.  Keep all follow-up visits as told by your health care provider. This is important. Contact a health care provider if:  You feel your heart skipping beats.  Your heart skips beats and you feel dizzy, light-headed, or very tired. Get help right away if you have:  Chest pain.  Trouble breathing.  Any symptoms of a stroke. "BE FAST" is an easy way to remember the main warning signs of a stroke. ? B - Balance. Signs are  dizziness, sudden trouble walking, or loss of balance. ? E - Eyes. Signs are trouble seeing or a sudden change in vision. ? F - Face. Signs are sudden weakness or numbness of the face, or the face or eyelid drooping on one side. ? A - Arms. Signs are weakness or numbness in an arm. This happens suddenly and usually on one side of the body. ? S - Speech. Signs are sudden trouble speaking, slurred speech, or trouble understanding what people say. ? T - Time. Time to call emergency services. Write down what time symptoms started.  Other signs of stroke, such as: ? A sudden, severe headache with no known cause. ? Nausea or vomiting. ? Seizure. These symptoms may represent a serious problem that is an emergency. Do not wait to see if the symptoms will go away. Get medical help right away. Call your local emergency services (911 in the [place] Do not drive yourself to the hospital. Summary  A premature atrial contraction [facility]) is a kind of irregular heartbeat (arrhythmia). It happens when the heart beats too early and then pauses before beating again.  Treatment depends on your symptoms and whether you have other underlying heart conditions.  Contact a health care provider if your heart skips beats and you feel dizzy, light-headed, or very tired.  In some cases, this condition may lead to a stroke. "BE FAST" is an easy way to remember the warning signs of stroke. Get help right away if you have any of the "BE FAST" signs. This information is not intended to replace advice given to you by your health care provider. Make sure you discuss any questions you have with your health care provider. Document Released: 03/29/2014 Document Revised: 04/20/2018 Document Reviewed: 04/20/2018 Elsevier Patient Education  2020 [employer].

## 2019-02-20 ENCOUNTER — Other Ambulatory Visit: Payer: Medicare HMO

## 2019-02-20 NOTE — Telephone Encounter (Signed)
Please respond

## 2019-02-21 ENCOUNTER — Telehealth: Payer: Self-pay | Admitting: Cardiology

## 2019-02-21 DIAGNOSIS — I10 Essential (primary) hypertension: Secondary | ICD-10-CM

## 2019-02-21 MED ORDER — SPIRONOLACTONE 25 MG PO TABS: 25.0000 mg | ORAL_TABLET | Freq: Every day | ORAL | 1 refills | Status: DC

## 2019-02-21 NOTE — Telephone Encounter (Signed)
Please read

## 2019-02-21 NOTE — Telephone Encounter (Signed)
BP has continued to be elevated. Will further increase Coreg to 12.5 mg BID. Will also add Aldactone 25 mg daily. BMP will be performed in 2 weeks.   He was noted to have 5 beats of NSVT followed by atrial run on 07/07 by event monitor. No symptoms at that time. He will need ischemic evaluation with stress testing, but patient wishes to hold off for now. I will move up his echo to be done sooner and see him back after that.

## 2019-02-21 NOTE — Telephone Encounter (Signed)
The echo has been moved up to 02/27/19 @ 1:30pm & patient is aware.

## 2019-02-21 NOTE — Telephone Encounter (Signed)
Msg from pt

## 2019-02-21 NOTE — Telephone Encounter (Signed)
Please respond

## 2019-02-22 NOTE — Telephone Encounter (Signed)
Please read

## 2019-02-23 NOTE — Telephone Encounter (Signed)
Norman respond

## 2019-02-26 NOTE — Telephone Encounter (Signed)
Please respond

## 2019-02-27 ENCOUNTER — Ambulatory Visit (INDEPENDENT_AMBULATORY_CARE_PROVIDER_SITE_OTHER): Payer: Medicare HMO

## 2019-02-27 ENCOUNTER — Other Ambulatory Visit: Payer: Self-pay

## 2019-02-27 DIAGNOSIS — I10 Essential (primary) hypertension: Secondary | ICD-10-CM | POA: Diagnosis not present

## 2019-02-27 DIAGNOSIS — I491 Atrial premature depolarization: Secondary | ICD-10-CM

## 2019-02-28 NOTE — Telephone Encounter (Signed)
Please respond

## 2019-03-02 NOTE — Telephone Encounter (Signed)
Please read

## 2019-03-08 DIAGNOSIS — I10 Essential (primary) hypertension: Secondary | ICD-10-CM | POA: Diagnosis not present

## 2019-03-08 NOTE — Telephone Encounter (Signed)
read

## 2019-03-12 NOTE — Telephone Encounter (Signed)
read

## 2019-03-12 NOTE — Telephone Encounter (Signed)
Please read

## 2019-03-14 ENCOUNTER — Ambulatory Visit: Payer: Medicare HMO | Admitting: Cardiology

## 2019-03-15 ENCOUNTER — Telehealth: Payer: Self-pay | Admitting: Cardiology

## 2019-03-15 NOTE — Telephone Encounter (Signed)
Discussed lab results with patient, WNL. Systolic readings avg 850-277. Diastolic avg 41-28. He wants to try increasing aldactone up to 50 mg. Will try this and follow up as scheduled on 08/18.

## 2019-03-16 ENCOUNTER — Other Ambulatory Visit: Payer: Self-pay | Admitting: Cardiology

## 2019-03-16 LAB — BASIC METABOLIC PANEL
BUN/Creatinine Ratio: 14 (ref 10–24)
BUN: 14 mg/dL (ref 8–27)
CO2: 20 mmol/L (ref 20–29)
Calcium: 9.7 mg/dL (ref 8.6–10.2)
Chloride: 101 mmol/L (ref 96–106)
Creatinine, Ser: 0.99 mg/dL (ref 0.76–1.27)
GFR calc Af Amer: 89 mL/min/{1.73_m2} (ref >59)
GFR calc non Af Amer: 77 mL/min/{1.73_m2} (ref >59)
Glucose: 109 mg/dL — ABNORMAL HIGH (ref 65–99)
Potassium: 4.4 mmol/L (ref 3.5–5.2)
Sodium: 139 mmol/L (ref 134–144)

## 2019-03-19 ENCOUNTER — Other Ambulatory Visit: Payer: Self-pay

## 2019-03-19 MED ORDER — SPIRONOLACTONE 50 MG PO TABS: 50.0000 mg | ORAL_TABLET | Freq: Every day | ORAL | 1 refills | Status: DC

## 2019-03-23 ENCOUNTER — Encounter: Payer: Self-pay | Admitting: Cardiology

## 2019-03-23 ENCOUNTER — Other Ambulatory Visit: Payer: Medicare HMO

## 2019-03-23 DIAGNOSIS — E291 Testicular hypofunction: Secondary | ICD-10-CM | POA: Diagnosis not present

## 2019-03-23 DIAGNOSIS — R300 Dysuria: Secondary | ICD-10-CM | POA: Diagnosis not present

## 2019-03-27 ENCOUNTER — Other Ambulatory Visit: Payer: Self-pay

## 2019-03-27 ENCOUNTER — Ambulatory Visit (INDEPENDENT_AMBULATORY_CARE_PROVIDER_SITE_OTHER): Payer: Medicare HMO | Admitting: Cardiology

## 2019-03-27 ENCOUNTER — Encounter: Payer: Self-pay | Admitting: Cardiology

## 2019-03-27 VITALS — BP 138/75 | HR 65 | Temp 97.5°F | Ht 67.5 in | Wt 195.7 lb

## 2019-03-27 DIAGNOSIS — I10 Essential (primary) hypertension: Secondary | ICD-10-CM

## 2019-03-27 DIAGNOSIS — I4729 Other ventricular tachycardia: Secondary | ICD-10-CM

## 2019-03-27 DIAGNOSIS — R0683 Snoring: Secondary | ICD-10-CM

## 2019-03-27 DIAGNOSIS — Z9889 Other specified postprocedural states: Secondary | ICD-10-CM

## 2019-03-27 DIAGNOSIS — I472 Ventricular tachycardia: Secondary | ICD-10-CM

## 2019-03-27 DIAGNOSIS — I491 Atrial premature depolarization: Secondary | ICD-10-CM | POA: Diagnosis not present

## 2019-03-27 MED ORDER — CARVEDILOL 12.5 MG PO TABS: 12.5000 mg | ORAL_TABLET | Freq: Two times a day (BID) | ORAL | 3 refills | Status: DC

## 2019-03-27 NOTE — Progress Notes (Signed)
Primary Physician:  Shirlean Mylar, MD   Patient ID: Norman Lick., male    DOB: 12-03-1950, 68 y.o.   MRN: [id number]  Subjective:    Chief Complaint  Patient presents with  . Hypertension    PAC  . Results    echo, monitor   . Follow-up    4wk    HPI: Norman Hudson.  is a 68 y.o. male  with prediabetes, hypertension, hyperlipidemia, chronically low testosterone, hypothyroidism, former smoker with 15 pack year history, anxiety, osteoarthritis, and recently evaluated by [place] for irregular heart beat and hypertension.   He was started on Coreg and Aldactone after his last visit. Underwent echocardiogram and event monitoring and now presents for follow up.  He states that blood pressure has significantly improved mostly in the 130 range systolic. He states that he feels as though he could run 2 miles. Denies any chest pain, dyspnea on exertion, PND, orthopnea, or symptoms of claudication. He does have mild leg edema that is chronic. He does snore. States he is noticing that he is sleeping better since his BP has been better controlled. States he had a sleep study several years ago that was negative. States that he and his wife actually sleep in separate rooms for the last 5 years.   No alcohol use or illicit drug use. Patient is mostly sedentary, but is working to lose weight. 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   Past Medical History:  Diagnosis Date  . Acne   . Anxiety    takes Xanax daily as needed  . Arthritis   . Diabetes mellitus without complication (HCC)    takes Metformin daily-per pt prediabetes  . History of kidney stones   . Hyperlipidemia    takes Pravastatin daily  . Hypertension    takes Amlodipine and Valsartan-HCTZ daily  . Hypothyroidism    takes Synthroid daily  . Inguinal hernia    left side  . Internal hemorrhoids   . Joint pain   . Rosacea     Past Surgical History:  Procedure Laterality  Date  . COLONOSCOPY    . LITHOTRIPSY  09/2015  . LUMBAR LAMINECTOMY WITH COFLEX 1 LEVEL N/A 07/07/2015   Procedure: LUMBAR FOUR-FIVE LUMBAR LAMINECTOMY WITH COFLEX;  Surgeon: Tia Alert, MD;  Location: [facility];  Service: Neurosurgery;  Laterality: N/A;  . TONSILLECTOMY    . 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 Marital status: Married    Spouse name: Not on file  . Number of children: 2  . Years of education: Not on file  . Highest education level: Not on file  Occupational History  . Not on file  Social Needs  . Financial resource strain: Not on file  . Food insecurity    Worry: Not on file    Inability: Not on file  . Transportation needs    Medical: Not on file    Non-medical: Not on file  Tobacco Use  . Smoking status: Former Smoker    Packs/day: 1.00    Years: 15.00    Pack years: 15.00    Types: Cigarettes    Quit date: 02/12/1989    Years since quitting: 30.1  . Smokeless tobacco: Never Used  . Tobacco comment: quit  smoking in 1991  Substance and Sexual Activity  . Alcohol use: No  . Drug use: No  . Sexual activity: Not on file  Lifestyle  . Physical activity    Days per week: Not on file    Minutes per session: Not on file  . Stress: Not on file  Relationships  . Social Musician on phone: Not on file    Gets together: Not on file    Attends religious service: Not on file    Active member of club or organization: Not on file    Attends meetings of clubs or organizations: Not on file    Relationship status: Not on file  . Intimate partner violence    Fear of current or ex partner: Not on file    Emotionally abused: Not on file    Physically abused: Not on file    Forced sexual activity: Not on file  Other Topics Concern  . Not on file  Social History Narrative   . Not on file    Review of Systems  Constitution: Negative for decreased appetite, malaise/fatigue, weight gain and weight loss.  Eyes: Negative for visual disturbance.  Cardiovascular: Negative for chest pain, claudication, dyspnea on exertion, leg swelling, orthopnea, palpitations and syncope.  Respiratory: Negative for hemoptysis and wheezing.   Endocrine: Negative for cold intolerance and heat intolerance.  Hematologic/Lymphatic: Does not bruise/bleed easily.  Skin: Negative for nail changes.  Musculoskeletal: Positive for arthritis. Negative for muscle weakness and myalgias.  Gastrointestinal: Negative for abdominal pain, change in bowel habit, nausea and vomiting.  Neurological: Negative for difficulty with concentration, dizziness, focal weakness and headaches.  Psychiatric/Behavioral: Negative for altered mental status and suicidal ideas.  All other systems reviewed and are negative.     Objective:  Blood pressure 138/75, pulse 65, temperature (!) 97.5 F (36.4 C), height 5' 7.5" (1.715 m), weight 195 lb 11.2 oz (88.8 kg), SpO2 98 %. Body mass index is 30.2 kg/m.    Physical Exam  Constitutional: He is oriented to person, place, and time. Vital signs are normal. He appears well-developed and well-nourished.  HENT:  Head: Normocephalic and atraumatic.  Neck: Normal range of motion.  Cardiovascular: Normal rate, regular rhythm, normal heart sounds and intact distal pulses.  Pulses:      Femoral pulses are 2+ on the right side and 2+ on the left side.      Popliteal pulses are 1+ on the right side and 1+ on the left side.       Dorsalis pedis pulses are 1+ on the right side and 1+ on the left side.       Posterior tibial pulses are 1+ on the right side and 1+ on the left side.  Trace leg edema  Pulmonary/Chest: Effort normal and breath sounds normal. No accessory muscle usage. No respiratory distress.  Abdominal: Soft. Bowel sounds are normal.  Musculoskeletal: Normal range  of motion.  Neurological: He is alert and oriented to person, place, and time.  Skin: Skin is warm and dry.  Vitals reviewed.  Radiology: No results found.  Laboratory examination:    CMP Latest Ref Rng & Units 03/08/2019 07/24/2016 07/12/2016  Glucose 65 - 99 mg/dL 324(M) 010(U) 725(D)  BUN 8 - 27 mg/dL 14 18 17   Creatinine 0.76 - 1.27 mg/dL 6.64 4.03 4.74  Sodium 134 - 144 mmol/L 139 137 139  Potassium 3.5 - 5.2 mmol/L 4.4 4.2 3.9  Chloride 96 - 106 mmol/L 101  101 106  CO2 20 - 29 mmol/L 20 27 23   Calcium 8.6 - 10.2 mg/dL 9.7 3.0(Q) 9.5  Total Protein 6.5 - 8.1 g/dL - - 7.0  Total Bilirubin 0.3 - 1.2 mg/dL - - 0.9  Alkaline Phos 38 - 126 U/L - - 49  AST 15 - 41 U/L - - 36  ALT 17 - 63 U/L - - 30   CBC Latest Ref Rng & Units 07/25/2016 07/24/2016 07/12/2016  WBC 4.0 - 10.5 K/uL 12.1(H) 11.2(H) 9.5  Hemoglobin 13.0 - 17.0 g/dL 65.7 84.6 96.2  Hematocrit 39.0 - 52.0 % 39.4 41.7 49.0  Platelets 150 - 400 K/uL 139(L) 162 170   Lipid Panel  No results found for: CHOL, TRIG, HDL, CHOLHDL, VLDL, LDLCALC, LDLDIRECT HEMOGLOBIN A1C Lab Results  Component Value Date   HGBA1C 5.8 (H) 07/12/2016   MPG 120 07/12/2016   TSH No results for input(s): TSH in the last 8760 hours.  PRN Meds:. Medications Discontinued During This Encounter  Medication Reason  . carvedilol (COREG) 6.25 MG tablet Dose change   Current Meds  Medication Sig  . ALPRAZolam (XANAX) 0.5 MG tablet Take 0.5 mg by mouth at bedtime as needed for anxiety.  Marland Kitchen amLODipine (NORVASC) 5 MG tablet Take 5 mg by mouth 2 (two) times daily.  Marland Kitchen aspirin EC 81 MG tablet Take 81 mg by mouth daily.  . carvedilol (COREG) 12.5 MG tablet Take 1 tablet (12.5 mg total) by mouth 2 (two) times daily with a meal.  . diclofenac (VOLTAREN) 50 MG EC tablet Take 50 mg by mouth as needed.   . doxycycline (VIBRA-TABS) 100 MG tablet Take 100 mg by mouth daily as needed (for adult acne).  . hydrochlorothiazide (HYDRODIURIL) 25 MG tablet Take 25  mg by mouth daily.  Marland Kitchen levothyroxine (SYNTHROID, LEVOTHROID) 100 MCG tablet Take 100 mcg by mouth daily before breakfast.  . metFORMIN (GLUCOPHAGE) 500 MG tablet Take 500 mg by mouth daily with breakfast.  . Multiple Vitamins-Minerals (MULTIVITAMIN WITH MINERALS) tablet Take 1 tablet by mouth daily.  Marland Kitchen olmesartan (BENICAR) 40 MG tablet Take 40 mg by mouth daily.  . pravastatin (PRAVACHOL) 20 MG tablet Take 20 mg by mouth at bedtime.  Marland Kitchen spironolactone (ALDACTONE) 50 MG tablet Take 1 tablet (50 mg total) by mouth daily.  Marland Kitchen sulfamethoxazole-trimethoprim (BACTRIM DS) 800-160 MG tablet Take 1 tablet by mouth 2 (two) times daily.  . Testosterone 12.5 MG/ACT (1%) GEL Apply 4 Pump topically daily.  . valACYclovir HCl (VALTREX PO) Take by mouth daily. 1 gm  . vitamin B-12 (CYANOCOBALAMIN) 500 MCG tablet Take 500 mcg by mouth daily.  . [DISCONTINUED] carvedilol (COREG) 6.25 MG tablet Take 12.5 mg by mouth 2 (two) times daily with a meal.    Cardiac Studies:   Echocardiogram 02/27/2019: Normal LV systolic function with EF 55%. Left ventricle cavity is normal in size. Normal left ventricular wall thickness. Normal global wall motion. Indeterminate diastolic filling pattern.  Mild tricuspid regurgitation. Estimated pulmonary artery systolic pressure is 26 mmHg.  21 day event monitor 07/07-07/27/2020: NSR with occasional PAC/PVC. 1 patient triggered event without reported symptoms correlated with NSR with PAC and PVC. 2 auto detected events occurred for NSR with 5 beats of NSVT a t 173 bpm on day 1 at 1453 and NSR with 6 beats of NSVT on day 12 at 1651. Both were asymptomatic. 1 auto detected event on day 14 for Sinus tachycardia with frequent PAC's. No A fib was noted.  Assessment:  ICD-10-CM   1. Essential hypertension  I10 Basic metabolic panel    Basic metabolic panel  2. NSVT (nonsustained ventricular tachycardia) (HCC)  I47.2 PCV MYOCARDIAL PERFUSION WO LEXISCAN  3. PAC (premature atrial  contraction)  I49.1   4. Snoring  R06.83     EKG 02/13/2019: Normal sinus rhythm at 75 bpm with 2 PAC's, normal axis, borderline criteria for LVH, no evidence of ischemia.   Recommendations:   Patient presents for follow-up.  I discussed recently obtained event monitor, he did have 2 episodes of NSVT that were asymptomatic.  Had occasional episodes of PACs.  In view of his NSVT and risk factors for CAD, I recommended further evaluation with exercise nuclear stress testing.  PACs have improved with Coreg, will continue same.  Blood pressure has significantly improved with current medications and is overall stable.  Kidney function has also remained stable.  Will check kidney function again in 2 to 3 weeks since we have recently increased his Aldactone to 50 mg.  He is also working on diet and lifestyle changes with weight loss to also help with controlling his blood pressure.  Echocardiogram reveals normal LVEF.  No symptoms of angina or clinical evidence of heart failure.  He does report snoring at night and actually sleeps in a separate room from his wife due to this.  In view of his NSVT, snoring, and difficult to control hypertension, will have him evaluated for possible sleep apnea.  I will see him back after stress test for follow-up.  Toniann Fail, MSN, APRN, FNP-C [facility]. [place] Office: [phone] Fax: [phone]

## 2019-03-30 ENCOUNTER — Other Ambulatory Visit: Payer: Self-pay | Admitting: Endocrinology

## 2019-03-30 MED ORDER — TESTOSTERONE 12.5 MG/ACT (1%) TD GEL: 4.0000 | Freq: Every day | TRANSDERMAL | 0 refills | Status: DC

## 2019-04-10 ENCOUNTER — Ambulatory Visit: Payer: Medicare HMO | Admitting: Neurology

## 2019-04-10 ENCOUNTER — Encounter: Payer: Self-pay | Admitting: Neurology

## 2019-04-10 ENCOUNTER — Other Ambulatory Visit: Payer: Self-pay

## 2019-04-10 VITALS — BP 141/82 | HR 58 | Temp 98.1°F | Ht 67.0 in | Wt 194.0 lb

## 2019-04-10 DIAGNOSIS — R351 Nocturia: Secondary | ICD-10-CM | POA: Insufficient documentation

## 2019-04-10 DIAGNOSIS — R5383 Other fatigue: Secondary | ICD-10-CM | POA: Diagnosis not present

## 2019-04-10 DIAGNOSIS — I10 Essential (primary) hypertension: Secondary | ICD-10-CM

## 2019-04-10 DIAGNOSIS — G4752 REM sleep behavior disorder: Secondary | ICD-10-CM | POA: Diagnosis not present

## 2019-04-10 DIAGNOSIS — G4719 Other hypersomnia: Secondary | ICD-10-CM | POA: Diagnosis not present

## 2019-04-10 DIAGNOSIS — R0683 Snoring: Secondary | ICD-10-CM | POA: Diagnosis not present
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
## 2019-04-10 NOTE — Patient Instructions (Signed)
   Melatonin 5 mg or less- at bedtime.    Screening for Sleep Apnea  Sleep apnea is a condition in which breathing pauses or becomes shallow during sleep. Sleep apnea screening is a test to determine if you are at risk for sleep apnea. The test is easy and only takes a few minutes. Your health care provider may ask you to have this test in preparation for surgery or as part of a physical exam. What are the symptoms of sleep apnea? Common symptoms of sleep apnea include:  Snoring.  Restless sleep.  Daytime sleepiness.  Pauses in breathing.  Choking during sleep.  Irritability.  Forgetfulness.  Trouble thinking clearly.  Depression.  Personality changes. Most people with sleep apnea are not aware that they have it. Why should I get screened? Getting screened for sleep apnea can help:  Ensure your safety. It is important for your health care providers to know whether or not you have sleep apnea, especially if you are having surgery or have other long-term (chronic) health conditions.  Improve your health and allow you to get a better night's rest. Restful sleep can help you: ? Have more energy. ? Lose weight. ? Improve high blood pressure. ? Improve diabetes management. ? Prevent stroke. ? Prevent car accidents. How is screening done? Screening usually includes being asked a list of questions about your sleep quality. Some questions you may be asked include:  Do you snore?  Is your sleep restless?  Do you have daytime sleepiness?  Has a partner or spouse told you that you stop breathing during sleep?  Have you had trouble concentrating or memory loss? If your screening test is positive, you are at risk for the condition. Further testing may be needed to confirm a diagnosis of sleep apnea. Where to find more information You can find screening tools online or at your health care clinic. For more information about sleep apnea screening and healthy sleep, visit these  websites:  Centers for Disease Control and Prevention: [employer]pl  American Sleep Apnea Association: www.sleepapnea.org Contact a health care provider if:  You think that you may have sleep apnea. Summary  Sleep apnea screening can help determine if you are at risk for sleep apnea.  It is important for your health care providers to know whether or not you have sleep apnea, especially if you are having surgery or have other chronic health conditions.  You may be asked to take a screening test for sleep apnea in preparation for surgery or as part of a physical exam. This information is not intended to replace advice given to you by your health care provider. Make sure you discuss any questions you have with your health care provider. Document Released: 11/05/2016 Document Revised: 05/12/2018 Document Reviewed: 11/05/2016 Elsevier Patient Education  2020 [employer].

## 2019-04-19 DIAGNOSIS — I10 Essential (primary) hypertension: Secondary | ICD-10-CM | POA: Diagnosis not present

## 2019-04-20 DIAGNOSIS — R300 Dysuria: Secondary | ICD-10-CM | POA: Diagnosis not present

## 2019-04-20 DIAGNOSIS — N411 Chronic prostatitis: Secondary | ICD-10-CM | POA: Diagnosis not present

## 2019-04-20 LAB — BASIC METABOLIC PANEL
BUN/Creatinine Ratio: 19 (ref 10–24)
BUN: 18 mg/dL (ref 8–27)
CO2: 24 mmol/L (ref 20–29)
Calcium: 10 mg/dL (ref 8.6–10.2)
Chloride: 100 mmol/L (ref 96–106)
Creatinine, Ser: 0.95 mg/dL (ref 0.76–1.27)
GFR calc Af Amer: 94 mL/min/{1.73_m2} (ref >59)
GFR calc non Af Amer: 81 mL/min/{1.73_m2} (ref >59)
Glucose: 107 mg/dL — ABNORMAL HIGH (ref 65–99)
Potassium: 4.3 mmol/L (ref 3.5–5.2)
Sodium: 137 mmol/L (ref 134–144)

## 2019-04-23 ENCOUNTER — Other Ambulatory Visit: Payer: Self-pay

## 2019-04-23 ENCOUNTER — Ambulatory Visit (INDEPENDENT_AMBULATORY_CARE_PROVIDER_SITE_OTHER): Payer: Medicare HMO

## 2019-04-23 DIAGNOSIS — I472 Ventricular tachycardia: Secondary | ICD-10-CM

## 2019-04-23 DIAGNOSIS — I4729 Other ventricular tachycardia: Secondary | ICD-10-CM

## 2019-04-24 NOTE — Telephone Encounter (Signed)
Please read

## 2019-04-30 ENCOUNTER — Other Ambulatory Visit: Payer: Self-pay | Admitting: Endocrinology

## 2019-04-30 MED ORDER — TESTOSTERONE 12.5 MG/ACT (1%) TD GEL: 4.0000 | Freq: Every day | TRANSDERMAL | 5 refills | Status: DC

## 2019-04-30 NOTE — Telephone Encounter (Signed)
Please review and refill if appropriate 

## 2019-05-01 ENCOUNTER — Other Ambulatory Visit: Payer: Self-pay

## 2019-05-01 ENCOUNTER — Encounter: Payer: Self-pay | Admitting: Cardiology

## 2019-05-01 ENCOUNTER — Ambulatory Visit: Payer: Medicare HMO | Admitting: Cardiology

## 2019-05-01 VITALS — BP 142/81 | Ht 67.0 in | Wt 196.0 lb

## 2019-05-01 DIAGNOSIS — I472 Ventricular tachycardia: Secondary | ICD-10-CM

## 2019-05-01 DIAGNOSIS — I4729 Other ventricular tachycardia: Secondary | ICD-10-CM

## 2019-05-01 DIAGNOSIS — R0683 Snoring: Secondary | ICD-10-CM | POA: Diagnosis not present

## 2019-05-01 DIAGNOSIS — R69 Illness, unspecified: Secondary | ICD-10-CM | POA: Diagnosis not present

## 2019-05-01 DIAGNOSIS — I10 Essential (primary) hypertension: Secondary | ICD-10-CM

## 2019-05-01 DIAGNOSIS — I491 Atrial premature depolarization: Secondary | ICD-10-CM

## 2019-05-01 NOTE — Progress Notes (Signed)
Primary Physician:  Shirlean Mylar, MD   Patient ID: Norman Lick., male    DOB: [birth year], 68 y.o.   MRN: [id number]  Subjective:    Chief Complaint  Patient presents with  . Hypertension    f/u after tests     HPI: Norman Hudson.  is a 68 y.o. male  with prediabetes, hypertension, hyperlipidemia, chronically low testosterone, hypothyroidism, former smoker with 15 pack year history, anxiety, osteoarthritis, and recently evaluated by [place] for irregular heart beat and hypertension.   He was started on Coreg and Aldactone after his last visit.  Patient underwent echocardiogram on 02/27/2019 revealing normal LVEF.  21-day event monitor was also performed showing occasional PACs and PVCs and two episodes of NSVT that were asymptomatic. In view of NSVT, he underwent nuclear stress testing and now presents for follow up.  Patient feeling well, no complaints today.  Blood pressure has continued to be much better controlled.  Tolerating medications well.   He does snore. States he is noticing that he is sleeping better since his BP has been better controlled. States he had a sleep study several years ago that was negative. States that he and his wife actually sleep in separate rooms for the last 5 years.   No alcohol use or illicit drug use. Patient is mostly sedentary, but is working to lose weight. 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   Past Medical History:  Diagnosis Date  . Acne   . Anxiety    takes Xanax daily as needed  . Arthritis   . Diabetes mellitus without complication (HCC)    takes Metformin daily-per pt prediabetes  . History of kidney stones   . Hyperlipidemia    takes Pravastatin daily  . Hypertension    takes Amlodipine and Valsartan-HCTZ daily  . Hypothyroidism    takes Synthroid daily  . Inguinal hernia    left side  . Internal hemorrhoids   . Joint pain   . Rosacea     Past Surgical History:   Procedure Laterality Date  . COLONOSCOPY    . LITHOTRIPSY  09/2015  . LUMBAR LAMINECTOMY WITH COFLEX 1 LEVEL N/A 07/07/2015   Procedure: LUMBAR FOUR-FIVE LUMBAR LAMINECTOMY WITH COFLEX;  Surgeon: Tia Alert, MD;  Location: [facility];  Service: Neurosurgery;  Laterality: N/A;  . TONSILLECTOMY    . 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   Procedure: TOTAL HIP ARTHROPLASTY;  Surgeon: Frederico Hamman, MD;  Location: [facility];  Service: Orthopedics;  Laterality: Left;    Social History   Socioeconomic History  . Marital status: Married    Spouse name: Not on file  . Number of children: 2  . Years of education: Not on file  . Highest education level: Bachelor's degree (e.g., BA, AB, BS)  Occupational History  . Not on file  Social Needs  . Financial resource strain: Not on file  . Food insecurity    Worry: Not on file    Inability: Not on file  . Transportation needs    Medical: Not on file    Non-medical: Not on file  Tobacco Use  . Smoking status: Former Smoker    Packs/day: 1.00    Years: 15.00    Pack years: 15.00    Types: Cigarettes    Quit date: 02/12/1989    Years since quitting: 30.2  . Smokeless tobacco: Never Used  . Tobacco comment: quit smoking in 1991  Substance and Sexual Activity  . Alcohol use: No  . Drug use: No  . Sexual activity: Not on file  Lifestyle  . Physical activity    Days per week: Not on file    Minutes per session: Not on file  . Stress: Not on file  Relationships  . Social Musician on phone: Not on file    Gets together: Not on file    Attends religious service: Not on file    Active member of club or organization: Not on file    Attends meetings of clubs or organizations: Not on file    Relationship status: Not on file  . Intimate partner violence    Fear of current or ex partner: Not on file    Emotionally abused: Not on file    Physically abused: Not on file    Forced sexual activity: Not on file  Other Topics  Concern  . Not on file  Social History Narrative   Lives at home with wife   4 cups of caffeine/day    Review of Systems  Constitution: Negative for decreased appetite, malaise/fatigue, weight gain and weight loss.  Eyes: Negative for visual disturbance.  Cardiovascular: Negative for chest pain, claudication, dyspnea on exertion, leg swelling, orthopnea, palpitations and syncope.  Respiratory: Negative for hemoptysis and wheezing.   Endocrine: Negative for cold intolerance and heat intolerance.  Hematologic/Lymphatic: Does not bruise/bleed easily.  Skin: Negative for nail changes.  Musculoskeletal: Positive for arthritis. Negative for muscle weakness and myalgias.  Gastrointestinal: Negative for abdominal pain, change in bowel habit, nausea and vomiting.  Neurological: Negative for difficulty with concentration, dizziness, focal weakness and headaches.  Psychiatric/Behavioral: Negative for altered mental status and suicidal ideas.  All other systems reviewed and are negative.     Objective:  Blood pressure (!) 142/81, height 5\' 7"  (1.702 m), weight 196 lb (88.9 kg), SpO2 99 %. Body mass index is 30.7 kg/m.    Physical Exam  Constitutional: He is oriented to person, place, and time. Vital signs are normal. He appears well-developed and well-nourished.  HENT:  Head: Normocephalic and atraumatic.  Neck: Normal range of motion.  Cardiovascular: Normal rate, regular rhythm, normal heart sounds and intact distal pulses.  Pulses:      Femoral pulses are 2+ on the right side and 2+ on the left side.      Popliteal pulses are 1+ on the right side and 1+ on the left side.       Dorsalis pedis pulses are 1+ on the right side and 1+ on the left side.       Posterior tibial pulses are 1+ on the right side and 1+ on the left side.  Trace leg edema  Pulmonary/Chest: Effort normal and breath sounds normal. No accessory muscle usage. No respiratory distress.  Abdominal: Soft. Bowel sounds are  normal.  Musculoskeletal: Normal range of motion.  Neurological: He is alert and oriented to person, place, and time.  Skin: Skin is warm and dry.  Vitals reviewed.  Radiology: No results found.  Laboratory examination:    CMP Latest Ref Rng & Units 04/19/2019 03/08/2019 07/24/2016  Glucose 65 - 99 mg/dL 161(W) 960(A) 540(J)  BUN 8 - 27 mg/dL 18 14 18   Creatinine 0.76 - 1.27 mg/dL 8.11 9.14 7.82  Sodium 134 - 144 mmol/L 137 139 137  Potassium 3.5 - 5.2 mmol/L 4.3 4.4 4.2  Chloride 96 - 106 mmol/L 100 101 101  CO2 20 -  29 mmol/L 24 20 27   Calcium 8.6 - 10.2 mg/dL 16.1 9.7 0.9(U)  Total Protein 6.5 - 8.1 g/dL - - -  Total Bilirubin 0.3 - 1.2 mg/dL - - -  Alkaline Phos 38 - 126 U/L - - -  AST 15 - 41 U/L - - -  ALT 17 - 63 U/L - - -   CBC Latest Ref Rng & Units 07/25/2016 07/24/2016 07/12/2016  WBC 4.0 - 10.5 K/uL 12.1(H) 11.2(H) 9.5  Hemoglobin 13.0 - 17.0 g/dL 04.5 40.9 81.1  Hematocrit 39.0 - 52.0 % 39.4 41.7 49.0  Platelets 150 - 400 K/uL 139(L) 162 170   Lipid Panel  No results found for: CHOL, TRIG, HDL, CHOLHDL, VLDL, LDLCALC, LDLDIRECT HEMOGLOBIN A1C Lab Results  Component Value Date   HGBA1C 5.8 (H) 07/12/2016   MPG 120 07/12/2016   TSH No results for input(s): TSH in the last 8760 hours.  PRN Meds:. There are no discontinued medications. Current Meds  Medication Sig  . ALPRAZolam (XANAX) 0.5 MG tablet Take 0.5 mg by mouth at bedtime as needed for anxiety.  Marland Kitchen amLODipine (NORVASC) 5 MG tablet Take 5 mg by mouth 2 (two) times daily.  Marland Kitchen aspirin EC 81 MG tablet Take 81 mg by mouth daily.  . carvedilol (COREG) 12.5 MG tablet Take 1 tablet (12.5 mg total) by mouth 2 (two) times daily with a meal.  . diclofenac (VOLTAREN) 50 MG EC tablet Take 50 mg by mouth as needed.   . doxycycline (VIBRA-TABS) 100 MG tablet Take 100 mg by mouth daily as needed (for adult acne).  . hydrochlorothiazide (HYDRODIURIL) 25 MG tablet Take 25 mg by mouth daily.  Marland Kitchen levothyroxine  (SYNTHROID, LEVOTHROID) 100 MCG tablet Take 100 mcg by mouth daily before breakfast.  . metFORMIN (GLUCOPHAGE) 500 MG tablet Take 500 mg by mouth daily with breakfast.  . Multiple Vitamins-Minerals (MULTIVITAMIN WITH MINERALS) tablet Take 1 tablet by mouth daily.  Marland Kitchen olmesartan (BENICAR) 40 MG tablet Take 40 mg by mouth daily.  . pravastatin (PRAVACHOL) 20 MG tablet Take 20 mg by mouth at bedtime.  Marland Kitchen spironolactone (ALDACTONE) 50 MG tablet Take 1 tablet (50 mg total) by mouth daily.  . Testosterone 12.5 MG/ACT (1%) GEL Apply 4 Pump topically daily.  . valACYclovir HCl (VALTREX PO) Take by mouth daily. 1 gm  . vitamin B-12 (CYANOCOBALAMIN) 500 MCG tablet Take 500 mcg by mouth daily.    Cardiac Studies:   Exercise myoview stress test  04/23/2019: Resting EKG NSR.  Stress EKG is negative for ischemia. There were occasional PVCs and paroxysmal episodes of brief 3-8 beat atrial tachycardia during stress testing and into recovery. Patient exercised for a total of 6:13 min., achieving approximately 7 METs. Normal BP response. Exercise was terminated due to fatigue/weakness and achieving THR. Normal myocardial perfusion. All segments of left ventricle demonstrated normal wall motion and thickening. Stress LV EF is hyperdynamic 75%.  No previous exam available for comparison. Low risk study.   Echocardiogram 02/27/2019: Normal LV systolic function with EF 55%. Left ventricle cavity is normal in size. Normal left ventricular wall thickness. Normal global wall motion. Indeterminate diastolic filling pattern.  Mild tricuspid regurgitation. Estimated pulmonary artery systolic pressure is 26 mmHg.  21 day event monitor 07/07-07/27/2020: NSR with occasional PAC/PVC. 1 patient triggered event without reported symptoms correlated with NSR with PAC and PVC. 2 auto detected events occurred for NSR with 5 beats of NSVT a t 173 bpm on day 1 at 1453 and NSR with  6 beats of NSVT on day 12 at 1651. Both were  asymptomatic. 1 auto detected event on day 14 for Sinus tachycardia with frequent PAC's. No A fib was noted.  Assessment:     ICD-10-CM   1. Essential hypertension  I10   2. PAC (premature atrial contraction)  I49.1   3. NSVT (nonsustained ventricular tachycardia) (HCC)  I47.2   4. Snoring  R06.83     EKG 02/13/2019: Normal sinus rhythm at 75 bpm with 2 PAC's, normal axis, borderline criteria for LVH, no evidence of ischemia.   Recommendations:   Blood pressure has continued to improve with increase of Aldactone.  Tolerating medications well, will continue the same.  I discussed recently obtain Lexiscan nuclear stress testing that was considered low risk study.  Continue with primary and secondary prevention measures.  He was noted to have occasional PACs and one episode of atrial run on stress testing.  Continue with Coreg.  Patient states that he feels the best he has in a long time.   In view of his NSVT, snoring, and difficult to control hypertension, will have him evaluated for possible sleep apnea.  Will place referral for this.  Blood pressure has significantly improved, and he is feeling much better, I will see him back in 6 months or sooner if problems.  Toniann Fail, MSN, APRN, FNP-C [facility] [place] Office: [phone] Fax: [phone]

## 2019-05-02 ENCOUNTER — Other Ambulatory Visit: Payer: Self-pay

## 2019-05-02 ENCOUNTER — Ambulatory Visit (INDEPENDENT_AMBULATORY_CARE_PROVIDER_SITE_OTHER): Payer: Medicare HMO | Admitting: Neurology

## 2019-05-02 DIAGNOSIS — G4734 Idiopathic sleep related nonobstructive alveolar hypoventilation: Secondary | ICD-10-CM

## 2019-05-02 DIAGNOSIS — R351 Nocturia: Secondary | ICD-10-CM

## 2019-05-02 DIAGNOSIS — G4719 Other hypersomnia: Secondary | ICD-10-CM

## 2019-05-02 DIAGNOSIS — R0683 Snoring: Secondary | ICD-10-CM

## 2019-05-02 DIAGNOSIS — I10 Essential (primary) hypertension: Secondary | ICD-10-CM

## 2019-05-02 DIAGNOSIS — R5383 Other fatigue: Secondary | ICD-10-CM

## 2019-05-02 DIAGNOSIS — G4752 REM sleep behavior disorder: Secondary | ICD-10-CM

## 2019-05-14 ENCOUNTER — Telehealth: Payer: Self-pay | Admitting: Neurology

## 2019-05-14 NOTE — Addendum Note (Signed)
Addended by: Larey Seat on: 05/14/2019 01:56 PM   Modules accepted: Orders

## 2019-05-14 NOTE — Telephone Encounter (Signed)
-----   Message from Larey Seat, MD sent at 05/14/2019  1:56 PM EDT ----- IMPRESSION:   1. 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 Normal EKG.  3. Supine sleep with hypoxemia.   RECOMMENDATIONS: I would prefer an attended sleep study with PAP  titration, given the remarkable frequency of   oxygen-desaturations in this first study. Should that not be  permitted by insurance, I will   advise to use auto -CPAP with heated humidity and pressures of  5-16 cm water, 2 cm EPR and mask of patient's choice. ONO on PAP  to follow.

## 2019-05-14 NOTE — Telephone Encounter (Signed)
I called pt. I advised pt that Dr. Dohmeier reviewed their sleep study results and found that sleep apnea and recommends that pt be treated with a cpap. Dr. Dohmeier recommends that pt return for a repeat sleep study in order to properly titrate the cpap and ensure a good mask fit. Pt is agreeable to returning for a titration study. I advised pt that our sleep lab will file with pt's insurance and call pt to schedule the sleep study when we hear back from the pt's insurance regarding coverage of this sleep study. Pt verbalized understanding of results. Pt had no questions at this time but was encouraged to call back if questions arise.   
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
## 2019-05-15 ENCOUNTER — Other Ambulatory Visit: Payer: Self-pay | Admitting: Cardiology

## 2019-06-01 ENCOUNTER — Other Ambulatory Visit: Payer: Self-pay

## 2019-06-01 ENCOUNTER — Other Ambulatory Visit (HOSPITAL_COMMUNITY)
Admission: RE | Admit: 2019-06-01 | Discharge: 2019-06-01 | Disposition: A | Discharge status: Home / Self Care | Payer: Medicare HMO | Source: Ambulatory Visit | Attending: Neurology | Admitting: Neurology

## 2019-06-01 DIAGNOSIS — Z01812 Encounter for preprocedural laboratory examination: Secondary | ICD-10-CM | POA: Diagnosis not present

## 2019-06-01 DIAGNOSIS — Z20828 Contact with and (suspected) exposure to other viral communicable diseases: Secondary | ICD-10-CM | POA: Diagnosis not present

## 2019-06-01 DIAGNOSIS — Z20822 Contact with and (suspected) exposure to covid-19: Secondary | ICD-10-CM

## 2019-06-02 LAB — NOVEL CORONAVIRUS, NAA (HOSP ORDER, SEND-OUT TO REF LAB; TAT 18-24 HRS): SARS-CoV-2, NAA: NOT DETECTED

## 2019-06-04 ENCOUNTER — Ambulatory Visit (INDEPENDENT_AMBULATORY_CARE_PROVIDER_SITE_OTHER): Payer: Medicare HMO | Admitting: Neurology

## 2019-06-04 DIAGNOSIS — G4734 Idiopathic sleep related nonobstructive alveolar hypoventilation: Secondary | ICD-10-CM

## 2019-06-04 DIAGNOSIS — G4719 Other hypersomnia: Secondary | ICD-10-CM

## 2019-06-04 DIAGNOSIS — R0683 Snoring: Secondary | ICD-10-CM

## 2019-06-04 DIAGNOSIS — R351 Nocturia: Secondary | ICD-10-CM

## 2019-06-04 DIAGNOSIS — G4752 REM sleep behavior disorder: Secondary | ICD-10-CM

## 2019-06-04 DIAGNOSIS — I10 Essential (primary) hypertension: Secondary | ICD-10-CM

## 2019-06-10 DIAGNOSIS — G459 Transient cerebral ischemic attack, unspecified: Secondary | ICD-10-CM

## 2019-06-10 HISTORY — DX: Transient cerebral ischemic attack, unspecified: G45.9

## 2019-06-12 NOTE — Addendum Note (Signed)
Addended by: Larey Seat on: 06/12/2019 03:43 PM   Modules accepted: Orders

## 2019-06-12 NOTE — Procedures (Signed)
PATIENT'S NAME:  Norman Hudson, Norman Hudson DOB:      07/08/1951      MR#:    [id number]     DATE OF RECORDING: 06/04/2019 [place] REFERRING M.D.:  Dr. Jacinto Halim, MD  Study Performed:   Titration to Positive Airway Pressure HISTORY:   Norman Hudson returns for PAP titration following his PSG from 05/02/19, which resulted in an AHI of 15/h, REM AHI of only 6.6/h, and supine AHI of 32.9/h. total time in desaturation was 20 minutes, and SpO2 nadir was 64%. The diagnosis was Complex- Central Sleep Apnea. The concern was for poorly controlled hypertension in relation to apnea.    The patient's weight 194 pounds with a height of 67 (inches), resulting in a BMI of 30.4 kg/m2. The patient's neck circumference measured 15.5 inches.  CURRENT MEDICATIONS: Xanax, Norvasc, Coreg, Vibra-tabs, Hydrodiuril, Synthroid, Metformin, Benicar, Aldactone, Testosterone, Valtrex, Cyanocobalamin  PROCEDURE:  This is a multichannel digital polysomnogram utilizing the SomnoStar 11.2 system.  Electrodes and sensors were applied and monitored per AASM Specifications.   EEG, EOG, Chin and Limb EMG, were sampled at 200 Hz.  ECG, Snore and Nasal Pressure, Thermal Airflow, Respiratory Effort, CPAP Flow and Pressure, Oximetry was sampled at 50 Hz. Digital video and audio were recorded.      CPAP was initiated at 5 cmH20 with heated humidity per AASM split night standards and pressure was advanced to 15 cmH20 CPAP, but hypopneas, apneas increased. The best result was seen at only 8 and 9 cm water pressure with AHI of zero. The patient was fitted with a ResMed AirFit F30 medium size full face mask. The technologist changed to BiPAP at 18/14 and finally to BiPAP with ST 10. Here an AHI of 4.1/h was the best result. ASV was briefly tried also.   At a CPAP pressure of 8 and 9 cmH20, there was a reduction of the AHI to 0.0/h with improvement of sleep apnea, but no REM sleep was recorded. The best control of REM apnea was seen under 11 cm  water.   Lights Out was at 22:47 and Lights On at 05:10. Total recording time (TRT) was 383 minutes, with a total sleep time (TST) of 331 minutes. The patient's sleep latency was 37.5 minutes. REM latency was 51 minutes.  The sleep efficiency was 86.4 %.    SLEEP ARCHITECTURE: WASO (Wake after sleep onset) was 47.5 minutes.  There were 22.5 minutes in Stage N1, 97 minutes Stage N2, 125.5 minutes Stage N3 and 86 minutes in Stage REM.  The percentage of Stage N1 was 6.8%, Stage N2 was 29.3%, Stage N3 was 37.9% and Stage R (REM sleep) was 26.%. The sleep architecture was notable for REM rebounding before midnight.  RESPIRATORY ANALYSIS:  There was a total of 33 respiratory events: 0 obstructive apneas, 3 central apneas and 25 mixed apneas with a total of 28 apneas and an apnea index (AI) of 5.1 /hour. There were 5 hypopneas with a hypopnea index of 0.9/hour. The patient also had 0 respiratory event related arousals (RERAs).     The total APNEA/HYPOPNEA INDEX (AHI) was 6.0 /hour and the total RESPIRATORY DISTURBANCE INDEX was 6.0/hour. 10 events occurred in REM sleep and 23 events in NREM. The REM AHI was 7. /hour versus a non-REM AHI of 5.6 /hour. The patient spent 215.5 minutes of total sleep time in the supine position and 116 minutes in non-supine. The supine AHI was 8.9, versus a non-supine AHI of 0.5.  OXYGEN SATURATION & C02:  The baseline 02 saturation was 97%, with the lowest being 77%. Time spent below 89% saturation equaled 6 minutes.  The arousals were noted as: 52 were spontaneous, 0 were associated with PLMs, and 13 were associated with respiratory events. The patient had a total of 0 Periodic Limb Movements.  Audio and video analysis did not show any abnormal or unusual movements, behaviors, phonations or vocalizations.  Snoring was noted. EKG was in keeping with normal sinus rhythm (NSR).   DIAGNOSIS 1. Here was mostly Obstructive Sleep Apnea noted, no central sleep apnea.  2. No Sleep  Related Hypoxemia   PLANS/RECOMMENDATIONS: Auto CPAP 7-11 cm water without EPR, with heated humidity and ResMed FFM medium size Airfit F 30.   A follow up appointment will be scheduled in the Sleep Clinic at [facility].   Please call [phone] with any questions.      I certify that I have reviewed the entire raw data recording prior to the issuance of this report in accordance with the Standards of Accreditation of the American Academy of Sleep Medicine (AASM)    Melvyn Novas, M.D.    06-12-2019 Diplomat, American Board of Psychiatry and Neurology  Diplomat, American Board of Sleep Medicine Medical Director, [place] [facility] [employer]

## 2019-06-13 ENCOUNTER — Telehealth: Payer: Self-pay | Admitting: Neurology

## 2019-06-13 NOTE — Telephone Encounter (Signed)
I called Norman Hudson. I advised Norman Hudson that Dr. Brett Fairy reviewed their sleep study results and found that Norman Hudson was best treated with a CPAP pressure of 8-9 cm water pressure. Dr. Brett Fairy recommends that Norman Hudson starts auto CPAP. I reviewed PAP compliance expectations with the Norman Hudson. Norman Hudson is agreeable to starting a CPAP. I advised Norman Hudson that an order will be sent to a DME, [facility], and [facility] will call the Norman Hudson within about one week after they file with the Norman Hudson's insurance. [facility] will show the Norman Hudson how to use the machine, fit for masks, and troubleshoot the CPAP if needed. A follow up appt was made for insurance purposes with Debbora Presto, NP on Jan 14,2021 at 2 pm . Norman Hudson verbalized understanding to arrive 15 minutes early and bring their CPAP. A letter with all of this information in it will be mailed to the Norman Hudson as a reminder. I verified with the Norman Hudson that the address we have on file is correct. Norman Hudson verbalized understanding of results. Norman Hudson had no questions at this time but was encouraged to call back if questions arise. I have sent the order to [facility] and have received confirmation that they have received the order.

## 2019-06-13 NOTE — Telephone Encounter (Signed)
-----   Message from Larey Seat, MD sent at 06/12/2019  3:43 PM EST ----- OXYGEN SATURATION & C02:  The baseline 02 saturation was 97%, with the lowest being 77%. Time spent below 89% saturation equaled 6 minutes.  The arousals were noted as: 52 were spontaneous, 0 were associated with PLMs, and 13 were associated with respiratory events. The patient had a total of 0 Periodic Limb Movements.  Audio and video analysis did not show any abnormal or unusual movements, behaviors, phonations or vocalizations.  Snoring was noted. EKG was in keeping with normal sinus rhythm (NSR).   DIAGNOSIS 1. Here was mostly Obstructive Sleep Apnea noted, no central sleep apnea.  2. No Sleep Related Hypoxemia. 3.         Avoid supine sleep !   PLANS/RECOMMENDATIONS: Auto CPAP 7-11 cm water without EPR, with heated humidity and ResMed FFM medium size Airfit F 30. Avoid supine sleep !

## 2019-06-13 NOTE — Telephone Encounter (Signed)
Called patient to discuss sleep study results. No answer at this time. LVM for the patient to call back.   

## 2019-06-13 NOTE — Telephone Encounter (Signed)
Pt returning call please call back °

## 2019-06-14 ENCOUNTER — Other Ambulatory Visit: Payer: Self-pay | Admitting: Cardiology

## 2019-06-18 DIAGNOSIS — G4733 Obstructive sleep apnea (adult) (pediatric): Secondary | ICD-10-CM | POA: Diagnosis not present

## 2019-06-21 NOTE — Telephone Encounter (Signed)
From patient.

## 2019-06-28 ENCOUNTER — Encounter: Payer: Self-pay | Admitting: Neurology

## 2019-06-28 ENCOUNTER — Other Ambulatory Visit: Payer: Self-pay | Admitting: Neurology

## 2019-06-28 DIAGNOSIS — G4719 Other hypersomnia: Secondary | ICD-10-CM

## 2019-06-28 DIAGNOSIS — G4733 Obstructive sleep apnea (adult) (pediatric): Secondary | ICD-10-CM

## 2019-06-28 DIAGNOSIS — G4752 REM sleep behavior disorder: Secondary | ICD-10-CM

## 2019-06-28 DIAGNOSIS — Z9989 Dependence on other enabling machines and devices: Secondary | ICD-10-CM

## 2019-06-28 DIAGNOSIS — R0683 Snoring: Secondary | ICD-10-CM

## 2019-06-28 NOTE — Telephone Encounter (Signed)
Please review

## 2019-06-28 NOTE — Telephone Encounter (Signed)
Hello [name] ,  I have  reviewed your CPAP data download: There are 2 things-  one is the upper pressure setting straddles what you need in terms of pressure for 95% of the sleep time. We should increase by 1 cm water.   The second is air leaks that seemed to arise only since the 13th Nov. in increasing fashion and influence the AHI count. Does the mask slip?  Larey Seat,  MD   ===View-only below this line===   ----- Message -----      From:Taylin Shirline Frees.      Sent:06/28/2019  8:05 AM EST        ZR:[id number] Norman Bushart, MD   Subject:Visit Follow-Up Question  Hello Dr. Brett Fairy, I hope all is well with you ! Re my new CPAP adventure.Marland KitchenMarland KitchenGood news is  threefold :  1. My BP has gone down slightly  2. I am having no trouble adjusting to it 3. Overall, I am sleeping better and feel somewhat better during the day.  The bad news is I do not feel good about my "event" results so far... After 10 days I was hoping they would be steady around 5 +/- .Marland KitchenMarland KitchenLast night was 17.2.Marland KitchenMarland KitchenA couple days ago 14.6..Marland KitchenMarland KitchenYou will see from my report that my nightly "event" results are very inconsistent. Is this normal at this stage ? I am a little discouraged.  Any suggestions ? I would really appreciate your thoughts. Thanks so much ! Best, Norman Hudson

## 2019-07-02 NOTE — Telephone Encounter (Signed)
Hi Tammy. I believe this was routed to me by mistake. Thanks!

## 2019-07-08 ENCOUNTER — Emergency Department (HOSPITAL_COMMUNITY): Payer: Medicare HMO

## 2019-07-08 ENCOUNTER — Encounter (HOSPITAL_COMMUNITY): Payer: Self-pay | Admitting: Emergency Medicine

## 2019-07-08 ENCOUNTER — Other Ambulatory Visit: Payer: Self-pay

## 2019-07-08 ENCOUNTER — Emergency Department (HOSPITAL_COMMUNITY)
Admission: EM | Admit: 2019-07-08 | Discharge: 2019-07-08 | Disposition: A | Discharge status: Home / Self Care | Payer: Medicare HMO | Attending: Emergency Medicine | Admitting: Emergency Medicine

## 2019-07-08 DIAGNOSIS — Z7982 Long term (current) use of aspirin: Secondary | ICD-10-CM | POA: Insufficient documentation

## 2019-07-08 DIAGNOSIS — Z79899 Other long term (current) drug therapy: Secondary | ICD-10-CM | POA: Insufficient documentation

## 2019-07-08 DIAGNOSIS — E119 Type 2 diabetes mellitus without complications: Secondary | ICD-10-CM | POA: Insufficient documentation

## 2019-07-08 DIAGNOSIS — Z87891 Personal history of nicotine dependence: Secondary | ICD-10-CM | POA: Insufficient documentation

## 2019-07-08 DIAGNOSIS — I10 Essential (primary) hypertension: Secondary | ICD-10-CM | POA: Insufficient documentation

## 2019-07-08 DIAGNOSIS — R4781 Slurred speech: Secondary | ICD-10-CM | POA: Diagnosis not present

## 2019-07-08 DIAGNOSIS — R4020 Unspecified coma: Secondary | ICD-10-CM | POA: Diagnosis not present

## 2019-07-08 DIAGNOSIS — G459 Transient cerebral ischemic attack, unspecified: Secondary | ICD-10-CM | POA: Diagnosis not present

## 2019-07-08 DIAGNOSIS — E039 Hypothyroidism, unspecified: Secondary | ICD-10-CM | POA: Diagnosis not present

## 2019-07-08 DIAGNOSIS — I6501 Occlusion and stenosis of right vertebral artery: Secondary | ICD-10-CM | POA: Diagnosis not present

## 2019-07-08 DIAGNOSIS — R4701 Aphasia: Secondary | ICD-10-CM | POA: Diagnosis present

## 2019-07-08 LAB — URINALYSIS, ROUTINE W REFLEX MICROSCOPIC
Bacteria, UA: NONE SEEN
Bilirubin Urine: NEGATIVE
Glucose, UA: NEGATIVE mg/dL
Ketones, ur: NEGATIVE mg/dL
Leukocytes,Ua: NEGATIVE
Nitrite: NEGATIVE
Protein, ur: NEGATIVE mg/dL
Specific Gravity, Urine: 1.011 (ref 1.005–1.030)
pH: 6 (ref 5.0–8.0)

## 2019-07-08 LAB — COMPREHENSIVE METABOLIC PANEL
ALT: 27 U/L (ref 0–44)
AST: 26 U/L (ref 15–41)
Albumin: 4.4 g/dL (ref 3.5–5.0)
Alkaline Phosphatase: 53 U/L (ref 38–126)
Anion gap: 10 (ref 5–15)
BUN: 19 mg/dL (ref 8–23)
CO2: 25 mmol/L (ref 22–32)
Calcium: 11.1 mg/dL — ABNORMAL HIGH (ref 8.9–10.3)
Chloride: 102 mmol/L (ref 98–111)
Creatinine, Ser: 1.04 mg/dL (ref 0.61–1.24)
GFR calc Af Amer: >60 mL/min (ref >60)
GFR calc non Af Amer: >60 mL/min (ref >60)
Glucose, Bld: 152 mg/dL — ABNORMAL HIGH (ref 70–99)
Potassium: 4.4 mmol/L (ref 3.5–5.1)
Sodium: 137 mmol/L (ref 135–145)
Total Bilirubin: 1.3 mg/dL — ABNORMAL HIGH (ref 0.3–1.2)
Total Protein: 7.6 g/dL (ref 6.5–8.1)

## 2019-07-08 LAB — RAPID URINE DRUG SCREEN, HOSP PERFORMED
Amphetamines: NOT DETECTED
Barbiturates: NOT DETECTED
Benzodiazepines: NOT DETECTED
Cocaine: NOT DETECTED
Opiates: NOT DETECTED
Tetrahydrocannabinol: NOT DETECTED

## 2019-07-08 LAB — I-STAT CHEM 8, ED
BUN: 21 mg/dL (ref 8–23)
Calcium, Ion: 1.38 mmol/L (ref 1.15–1.40)
Chloride: 102 mmol/L (ref 98–111)
Creatinine, Ser: 0.9 mg/dL (ref 0.61–1.24)
Glucose, Bld: 144 mg/dL — ABNORMAL HIGH (ref 70–99)
HCT: 44 % (ref 39.0–52.0)
Hemoglobin: 15 g/dL (ref 13.0–17.0)
Potassium: 4.5 mmol/L (ref 3.5–5.1)
Sodium: 137 mmol/L (ref 135–145)
TCO2: 26 mmol/L (ref 22–32)

## 2019-07-08 LAB — CBC
HCT: 42.9 % (ref 39.0–52.0)
Hemoglobin: 15.1 g/dL (ref 13.0–17.0)
MCH: 34.3 pg — ABNORMAL HIGH (ref 26.0–34.0)
MCHC: 35.2 g/dL (ref 30.0–36.0)
MCV: 97.5 fL (ref 80.0–100.0)
Platelets: 174 10*3/uL (ref 150–400)
RBC: 4.4 MIL/uL (ref 4.22–5.81)
RDW: 12.2 % (ref 11.5–15.5)
WBC: 7.3 10*3/uL (ref 4.0–10.5)
nRBC: 0 % (ref 0.0–0.2)

## 2019-07-08 LAB — DIFFERENTIAL
Abs Immature Granulocytes: 0.03 10*3/uL (ref 0.00–0.07)
Basophils Absolute: 0 10*3/uL (ref 0.0–0.1)
Basophils Relative: 0 %
Eosinophils Absolute: 0.2 10*3/uL (ref 0.0–0.5)
Eosinophils Relative: 3 %
Immature Granulocytes: 0 %
Lymphocytes Relative: 22 %
Lymphs Abs: 1.6 10*3/uL (ref 0.7–4.0)
Monocytes Absolute: 0.6 10*3/uL (ref 0.1–1.0)
Monocytes Relative: 9 %
Neutro Abs: 4.8 10*3/uL (ref 1.7–7.7)
Neutrophils Relative %: 66 %

## 2019-07-08 LAB — APTT: aPTT: 26 seconds (ref 24–36)

## 2019-07-08 LAB — PROTIME-INR
INR: 1 (ref 0.8–1.2)
Prothrombin Time: 13.5 seconds (ref 11.4–15.2)

## 2019-07-08 MED ORDER — SODIUM CHLORIDE 0.9 % IV SOLN: 100.0000 mL/h | INTRAVENOUS | Status: DC

## 2019-07-08 MED ORDER — SODIUM CHLORIDE 0.9 % IV BOLUS
500.0000 mL | Freq: Once | INTRAVENOUS | Status: AC
  Administered 2019-07-08: 500 mL via INTRAVENOUS

## 2019-07-08 MED ORDER — CLOPIDOGREL BISULFATE 75 MG PO TABS
75.0000 mg | ORAL_TABLET | Freq: Once | ORAL | Status: AC
  Administered 2019-07-08: 16:00:00 75 mg via ORAL
  Filled 2019-07-08: qty 1

## 2019-07-08 MED ORDER — CLOPIDOGREL BISULFATE 75 MG PO TABS: 75.0000 mg | ORAL_TABLET | Freq: Every day | ORAL | 0 refills | Status: DC

## 2019-07-08 MED ORDER — IOHEXOL 350 MG/ML SOLN
75.0000 mL | Freq: Once | INTRAVENOUS | Status: AC | PRN
  Administered 2019-07-08: 75 mL via INTRAVENOUS

## 2019-07-08 NOTE — ED Provider Notes (Signed)
[facility] EMERGENCY DEPARTMENT Provider Note   CSN: [id number] Arrival date & time: 07/08/19  1255     History   Chief Complaint Chief Complaint  Patient presents with   Aphasia    HPI Norman Hudson. is a 68 y.o. male.     HPI Presents after an episode of aphasia. He was in his usual state of health, has no prior similar episodes, but today, just prior to calling EMS he had approximately 5 minutes of difficulty with word finding. There is no prodrome, no clear precipitant, and no clear intervention resulted in cessation. There is no pain, no weakness, no noted facial asymmetry by the patient's wife to EMS providers. EMS providers note that the patient has been awake, alert, speaking in a clear voice and transport. He does have mild hypertension.  He notes that he takes his medication regularly. Today, he noticed that while he was having difficulty speaking he was also more hypertensive than usual. He takes aspirin daily, 81 mg at night.  Today, after onset of symptoms he took an additional 81 mg tablet of aspirin. 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   Loud snoring 04/10/2019   Excessive daytime sleepiness 04/10/2019   Other fatigue 04/10/2019   Nocturia more than twice per night 04/10/2019   Malignant hypertension 04/10/2019   Low testosterone 04/03/2018   Osteoarthritis of left hip 07/23/2016     S/P lumbar laminectomy 07/07/2015    Past Surgical History:  Procedure Laterality Date   COLONOSCOPY     LITHOTRIPSY  09/2015   LUMBAR LAMINECTOMY WITH COFLEX 1 LEVEL N/A 07/07/2015   Procedure: LUMBAR FOUR-FIVE LUMBAR LAMINECTOMY WITH COFLEX;  Surgeon: Eustace Moore, MD;  Location: [place] [facility];  Service: Neurosurgery;  Laterality: N/A;   TONSILLECTOMY     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 Authorizing Provider  ALPRAZolam Duanne Moron) 0.5 MG tablet Take 0.5 mg by mouth at bedtime as needed for anxiety.    [provider]  amLODipine (NORVASC) 5 MG tablet Take 5 mg by mouth 2 (two) times daily.    [provider]  aspirin EC 81 MG tablet Take 81 mg by mouth daily.    [provider]  carvedilol (COREG) 12.5 MG tablet Take 1 tablet (12.5 mg total) by mouth 2 (two) times daily with a meal. 03/27/19   Miquel Dunn, NP  diclofenac (VOLTAREN) 50 MG EC tablet Take 50 mg by mouth as needed.     [provider]  doxycycline (VIBRA-TABS) 100 MG tablet Take 100 mg by mouth  daily as needed (for adult acne).    [provider]  hydrochlorothiazide (HYDRODIURIL) 25 MG tablet Take 25 mg by mouth daily.    [provider]  levothyroxine (SYNTHROID, LEVOTHROID) 100 MCG tablet Take 100 mcg by mouth daily before breakfast.    [provider]  metFORMIN (GLUCOPHAGE) 500 MG tablet Take 500 mg by mouth daily with breakfast.    [provider]  Multiple Vitamins-Minerals (MULTIVITAMIN WITH MINERALS) tablet Take 1 tablet by mouth daily.    [provider]  olmesartan (BENICAR) 40 MG tablet Take 40 mg by mouth daily.    [provider]  pravastatin (PRAVACHOL) 20 MG tablet Take  20 mg by mouth at bedtime.    [provider]  spironolactone (ALDACTONE) 50 MG tablet TAKE 1 TABLET ONCE DAILY. 06/14/19   Miquel Dunn, NP  Testosterone 12.5 MG/ACT (1%) GEL Apply 4 Pump topically daily. 04/30/19   Renato Shin, MD  valACYclovir HCl (VALTREX PO) Take by mouth daily. 1 gm    [provider]  vitamin B-12 (CYANOCOBALAMIN) 500 MCG tablet Take 500 mcg by mouth daily.    [provider]    Family History Family History  Problem Relation Age of Onset   Hypertension Father    Sleep disorder Father    CVA Father    Cancer Maternal Grandfather    Hypertension Paternal Grandmother    Diabetes Paternal 37    Alcoholism Mother    Hypertension Sister    Aneurysm Sister    Atrial fibrillation Sister    Other Neg Hx        hypogonadism   Sleep apnea Neg Hx     Social History Social History   Tobacco Use   Smoking status: Former Smoker    Packs/day: 1.00    Years: 15.00    Pack years: 15.00    Types: Cigarettes    Quit date: 02/12/1989    Years since quitting: 30.4   Smokeless tobacco: Never Used   Tobacco comment: quit smoking in 1991  Substance Use Topics   Alcohol use: No   Drug use: No     Allergies   Bactrim [sulfamethoxazole-trimethoprim] and Tramadol   Review of Systems Review of Systems  Constitutional:       Per HPI, otherwise negative  HENT:       Per HPI, otherwise negative  Respiratory:       Per HPI, otherwise negative  Cardiovascular:       Per HPI, otherwise negative  Gastrointestinal: Negative for vomiting.  Endocrine:       Negative aside from HPI  Genitourinary:       Neg aside from HPI   Musculoskeletal:       Per HPI, otherwise negative  Skin: Negative.   Neurological: Positive for speech difficulty. Negative for syncope.     Physical Exam Updated Vital Signs BP (!) 178/81 (BP Location: Left Arm)    Pulse 69    Temp 98.4 F (36.9 C)    Resp 14    Ht 5\' 8"  (1.727  m)    Wt 89.8 kg    SpO2 100%    BMI 30.11 kg/m   Physical Exam Vitals signs and nursing note reviewed.  Constitutional:      General: He is not in acute distress.    Appearance: He is well-developed.  HENT:     Head: Normocephalic and atraumatic.  Eyes:     Conjunctiva/sclera: Conjunctivae normal.  Cardiovascular:  Rate and Rhythm: Normal rate and regular rhythm.  Pulmonary:     Effort: Pulmonary effort is normal. No respiratory distress.     Breath sounds: No stridor.  Abdominal:     General: There is no distension.  Skin:    General: Skin is warm and dry.  Neurological:     General: No focal deficit present.     Mental Status: He is alert and oriented to person, place, and time.      ED Treatments / Results  Labs (all labs ordered are listed, but only abnormal results are displayed) Labs Reviewed  CBC - Abnormal; Notable for the following components:      Result Value   MCH 34.3 (*)    All other components within normal limits  COMPREHENSIVE METABOLIC PANEL - Abnormal; Notable for the following components:   Glucose, Bld 152 (*)    Calcium 11.1 (*)    Total Bilirubin 1.3 (*)    All other components within normal limits  URINALYSIS, ROUTINE W REFLEX MICROSCOPIC - Abnormal; Notable for the following components:   Hgb urine dipstick SMALL (*)    All other components within normal limits  I-STAT CHEM 8, ED - Abnormal; Notable for the following components:   Glucose, Bld 144 (*)    All other components within normal limits  PROTIME-INR  APTT  DIFFERENTIAL  RAPID URINE DRUG SCREEN, HOSP PERFORMED    EKG EKG Interpretation  Date/Time:  Sunday July 08 2019 13:00:13 EST Ventricular Rate:  69 PR Interval:    QRS Duration: 93 QT Interval:  399 QTC Calculation: 428 R Axis:   49 Text Interpretation: likely Sinus rhythm but with substantial artefact Abnormal ECG Confirmed by Carmin Muskrat [phone]) on 07/08/2019 1:24:50 PM   Radiology Ct Angio Head W Or  Wo Contrast  Result Date: 07/08/2019 CLINICAL DATA:  Transient expressive aphasia today. EXAM: CT ANGIOGRAPHY HEAD AND NECK TECHNIQUE: Multidetector CT imaging of the head and neck was performed using the standard protocol during bolus administration of intravenous contrast. Multiplanar CT image reconstructions and MIPs were obtained to evaluate the vascular anatomy. Carotid stenosis measurements (when applicable) are obtained utilizing NASCET criteria, using the distal internal carotid diameter as the denominator. CONTRAST:  36mL OMNIPAQUE IOHEXOL 350 MG/ML SOLN COMPARISON:  Brain MRI from earlier the same day FINDINGS: CTA NECK FINDINGS Aortic arch: Mild atherosclerotic calcification. Three vessel branching. Right carotid system: Mild atherosclerotic plaque at the ICA bulb. No stenosis or ulceration. Left carotid system: Mild-to-moderate atherosclerotic plaque, mainly calcified, at the ICA bulb without stenosis or ulceration. Vertebral arteries: Left dominant vertebral artery. Mild-to-moderate atherosclerotic narrowing at the right vertebral origin, quantification limited by small vessel size. No proximal subclavian stenosis. Skeleton: Cervical disc degeneration with spurring and reversal of cervical lordosis. Other neck: Calcified nodule in the right thyroid, incidental. Upper chest: Negative Review of the MIP images confirms the above findings CTA HEAD FINDINGS Anterior circulation: Very mild calcification of the carotid siphons. No branch occlusion, beading, or aneurysm. Posterior circulation: The vertebral and basilar arteries are smooth and widely patent. Fetal type left PCA. No branch occlusion, beading, or aneurysm Venous sinuses: Patent Anatomic variants: As above Review of the MIP images confirms the above findings IMPRESSION: 1. Overall mild atherosclerosis. No flow limiting stenosis or ulceration/embolic source seen in the head and neck. 2. Mild to moderate narrowing at the origin of the non dominant  right vertebral artery. Electronically Signed   By: Monte Fantasia M.D.   On: 07/08/2019 15:36  Ct Angio Neck W And/or Wo Contrast  Result Date: 07/08/2019 CLINICAL DATA:  Transient expressive aphasia today. EXAM: CT ANGIOGRAPHY HEAD AND NECK TECHNIQUE: Multidetector CT imaging of the head and neck was performed using the standard protocol during bolus administration of intravenous contrast. Multiplanar CT image reconstructions and MIPs were obtained to evaluate the vascular anatomy. Carotid stenosis measurements (when applicable) are obtained utilizing NASCET criteria, using the distal internal carotid diameter as the denominator. CONTRAST:  20mL OMNIPAQUE IOHEXOL 350 MG/ML SOLN COMPARISON:  Brain MRI from earlier the same day FINDINGS: CTA NECK FINDINGS Aortic arch: Mild atherosclerotic calcification. Three vessel branching. Right carotid system: Mild atherosclerotic plaque at the ICA bulb. No stenosis or ulceration. Left carotid system: Mild-to-moderate atherosclerotic plaque, mainly calcified, at the ICA bulb without stenosis or ulceration. Vertebral arteries: Left dominant vertebral artery. Mild-to-moderate atherosclerotic narrowing at the right vertebral origin, quantification limited by small vessel size. No proximal subclavian stenosis. Skeleton: Cervical disc degeneration with spurring and reversal of cervical lordosis. Other neck: Calcified nodule in the right thyroid, incidental. Upper chest: Negative Review of the MIP images confirms the above findings CTA HEAD FINDINGS Anterior circulation: Very mild calcification of the carotid siphons. No branch occlusion, beading, or aneurysm. Posterior circulation: The vertebral and basilar arteries are smooth and widely patent. Fetal type left PCA. No branch occlusion, beading, or aneurysm Venous sinuses: Patent Anatomic variants: As above Review of the MIP images confirms the above findings IMPRESSION: 1. Overall mild atherosclerosis. No flow limiting  stenosis or ulceration/embolic source seen in the head and neck. 2. Mild to moderate narrowing at the origin of the non dominant right vertebral artery. Electronically Signed   By: Monte Fantasia M.D.   On: 07/08/2019 15:36   Mr Brain Wo Contrast  Result Date: 07/08/2019 CLINICAL DATA:  Altered level of consciousness with stroke suspected. Episode of expressive aphasia lasting 3-5 minutes. EXAM: MRI HEAD WITHOUT CONTRAST TECHNIQUE: Multiplanar, multiecho pulse sequences of the brain and surrounding structures were obtained without intravenous contrast. COMPARISON:  None. FINDINGS: Brain: No acute infarction, hemorrhage, hydrocephalus, extra-axial collection or mass lesion. Age normal brain volume. Few remote cerebral white matter insults which correlate with vascular risk factors in the medical history. Vascular: Normal flow voids Skull and upper cervical spine: Normal marrow signal Sinuses/Orbits: Mild mucosal thickening in the ethmoid sinuses. T2 hyperintensity in the left nasal cavity has a polypoid appearance on axial T2 weighted imaging. IMPRESSION: 1. No emergent finding including infarct. 2. Mild chronic small vessel ischemic type change in the cerebral white matter. 3. Mild right frontal ethmoid sinusitis. Probable small left nasal cavity polyp. Electronically Signed   By: Monte Fantasia M.D.   On: 07/08/2019 15:00    Procedures Procedures (including critical care time)  Medications Ordered in ED Medications  sodium chloride 0.9 % bolus 500 mL (0 mLs Intravenous Stopped 07/08/19 1548)    Followed by  0.9 %  sodium chloride infusion (has no administration in time range)  iohexol (OMNIPAQUE) 350 MG/ML injection 75 mL (75 mLs Intravenous Contrast Given 07/08/19 1506)  clopidogrel (PLAVIX) tablet 75 mg (75 mg Oral Given 07/08/19 1553)     Initial Impression / Assessment and Plan / ED Course  I have reviewed the triage vital signs and the nursing notes.  Pertinent labs & imaging results  that were available during my care of the patient were reviewed by me and considered in my medical decision making (see chart for details).        1:32 PM  Patient in NAD  3:04 PM Initial labs reassuring, without substantial abnormality. CT, CT angiography pending.  MRI reassuring with no evidence for acute infarct.  I discussed the patient's case with our neurology colleagues to consider appropriate imaging.  3:53 PM Patient in no distress, weight, alert, ambulatory, in no distress, speaking clearly, no return of his aphasia. We discussed all findings again, now with reassuring CT angiography results. With no evidence for substantial occlusion, no evidence for out stroke MRI, there is still suspicion for TIA. Patient has started Plavix, will continue Plavix, aspirin, with outpatient neurology follow-up, cardiology follow-up for echocardiogram, and primary care as well.   Final Clinical Impressions(s) / ED Diagnoses   Final diagnoses:  TIA (transient ischemic attack)    ED Discharge Orders         Ordered    clopidogrel (PLAVIX) 75 MG tablet  Daily     07/08/19 1556    Ambulatory referral to Neurology    Comments: An appointment is requested in approximately: 1 week   07/08/19 1556           Carmin Muskrat, MD 07/08/19 1558

## 2019-07-08 NOTE — Discharge Instructions (Signed)
As discussed, you have been diagnosed with a transient ischemic attack. It is very important for you to follow-up with your primary care physician, our cardiology colleagues for consideration of an echocardiogram, and our neurology colleagues as well.  Please take all medication as directed and do not hesitate to return here if you develop new, or concerning changes in your condition.

## 2019-07-08 NOTE — ED Notes (Signed)
Patient transported to MRI 

## 2019-07-08 NOTE — ED Notes (Signed)
Pt verbalized understanding of discharge paperwork, prescriptions and follow-up care 

## 2019-07-08 NOTE — ED Triage Notes (Signed)
Pt arrives via EMS from home with reports of an episode of expressive aphasia around 11 today that lasted about 3-5 min. Denies any weakness.

## 2019-07-10 ENCOUNTER — Encounter: Payer: Self-pay | Admitting: Neurology

## 2019-07-10 NOTE — Progress Notes (Signed)
Primary Physician:  Shon Hale, MD   Patient ID: Norman Hudson., male    DOB: 1951-08-02, 68 y.o.   MRN: [id number]  Subjective:    Chief Complaint  Patient presents with  . Pacemaker Problem  . Hypertension    HPI: Norman Hudson.  is a 68 y.o. male  with prediabetes, hypertension, hyperlipidemia, chronically low testosterone, hypothyroidism, former smoker with 15 pack year history, anxiety, osteoarthritis, and recently evaluated by [place] for irregular heart beat and hypertension.   He was started on Coreg and Aldactone after his last visit.  Patient underwent echocardiogram on 02/27/2019 revealing normal LVEF.  21-day event monitor was also performed showing occasional PACs and PVCs and two episodes of NSVT that were asymptomatic. In view of NSVT, he underwent exercise nuclear stress testing in Sept 2020 that was considered low risk study.   He was recently seen in the ER on 07/08/19 with TIA with symptoms of aphasia.Aphasia resolved prior to EMS transportation. MRI and CTA unyielding. He was started on Plavix and outpatient Neurology and follow up with [place] was recommended.   He has not had any recurrence of aphasia. Patient feeling well, no complaints today.  Blood pressure has continued to be much better controlled. Tolerating medications well.   He has found to have sleep apnea and is now using CPAP, but has recently stopped using it as directed by ER. He reports that he has not had significant improvement in his symptoms with use of CPAP and had several days of severe headache that was not relieved by ibuprofen prior to his TIA. States that it felt like a bad sinus headache. He is hoping to discuss with Dr. Vickey Huger soon, has pending appt on 01/04.     No alcohol use or illicit drug use. Patient is mostly sedentary, but is working to lose weight. 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   Past Medical History:   Diagnosis Date  . Acne   . Anxiety    takes Xanax daily as needed  . Arthritis   . Diabetes mellitus without complication (HCC)    takes Metformin daily-per pt prediabetes  . History of kidney stones   . Hyperlipidemia    takes Pravastatin daily  . Hypertension    takes Amlodipine and Valsartan-HCTZ daily  . Hypothyroidism    takes Synthroid daily  . Inguinal hernia    left side  . Internal hemorrhoids   . Joint pain   . Rosacea     Past Surgical History:  Procedure Laterality Date  . COLONOSCOPY    . LITHOTRIPSY  09/2015  . LUMBAR LAMINECTOMY WITH COFLEX 1 LEVEL N/A 07/07/2015   Procedure: LUMBAR FOUR-FIVE LUMBAR LAMINECTOMY WITH COFLEX;  Surgeon: Tia Alert, MD;  Location: [facility];  Service: Neurosurgery;  Laterality: N/A;  . TONSILLECTOMY    . 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 Marital status: Married    Spouse name: Not on file  . Number of children: 2  . Years of education: Not on file  . Highest education level: Bachelor's degree (e.g., BA, AB, BS)  Occupational History  . Not on file  Social Needs  . Financial resource strain: Not on file  . Food insecurity    Worry: Not  on file    Inability: Not on file  . Transportation needs    Medical: Not on file    Non-medical: Not on file  Tobacco Use  . Smoking status: Former Smoker    Packs/day: 1.00    Years: 15.00    Pack years: 15.00    Types: Cigarettes    Quit date: 02/12/1989    Years since quitting: 30.4  . Smokeless tobacco: Never Used  . Tobacco comment: quit smoking in 1991  Substance and Sexual Activity  . Alcohol use: No  . Drug use: No  . Sexual activity: Not on file  Lifestyle  . Physical activity    Days per week: Not on file    Minutes per session: Not on file  . Stress: Not on file  Relationships  .  Social Musician on phone: Not on file    Gets together: Not on file    Attends religious service: Not on file    Active member of club or organization: Not on file    Attends meetings of clubs or organizations: Not on file    Relationship status: Not on file  . Intimate partner violence    Fear of current or ex partner: Not on file    Emotionally abused: Not on file    Physically abused: Not on file    Forced sexual activity: Not on file  Other Topics Concern  . Not on file  Social History Narrative   Lives at home with wife   4 cups of caffeine/day    Review of Systems  Constitution: Negative for decreased appetite, malaise/fatigue, weight gain and weight loss.  Eyes: Negative for visual disturbance.  Cardiovascular: Negative for chest pain, claudication, dyspnea on exertion, leg swelling, orthopnea, palpitations and syncope.  Respiratory: Negative for hemoptysis and wheezing.   Endocrine: Negative for cold intolerance and heat intolerance.  Hematologic/Lymphatic: Does not bruise/bleed easily.  Skin: Negative for nail changes.  Musculoskeletal: Positive for arthritis. Negative for muscle weakness and myalgias.  Gastrointestinal: Negative for abdominal pain, change in bowel habit, nausea and vomiting.  Neurological: Negative for difficulty with concentration, dizziness, focal weakness and headaches.  Psychiatric/Behavioral: Negative for altered mental status and suicidal ideas.  All other systems reviewed and are negative.     Objective:  Blood pressure 124/75, pulse 66, temperature (!) 97.3 F (36.3 C), height 5\' 8"  (1.727 m), weight 199 lb 12.8 oz (90.6 kg), SpO2 97 %. Body mass index is 30.38 kg/m.    Physical Exam  Constitutional: He is oriented to person, place, and time. Vital signs are normal. He appears well-developed and well-nourished.  HENT:  Head: Normocephalic and atraumatic.  Neck: Normal range of motion.  Cardiovascular: Normal rate, regular  rhythm, normal heart sounds and intact distal pulses.  Pulses:      Femoral pulses are 2+ on the right side and 2+ on the left side.      Popliteal pulses are 1+ on the right side and 1+ on the left side.       Dorsalis pedis pulses are 1+ on the right side and 1+ on the left side.       Posterior tibial pulses are 1+ on the right side and 1+ on the left side.  Trace leg edema  Pulmonary/Chest: Effort normal and breath sounds normal. No accessory muscle usage. No respiratory distress.  Abdominal: Soft. Bowel sounds are normal.  Musculoskeletal: Normal range of motion.  Neurological: He is alert  and oriented to person, place, and time.  Skin: Skin is warm and dry.  Vitals reviewed.  Radiology:  MRI of brain 07/08/2019:  1. No emergent finding including infarct. 2. Mild chronic small vessel ischemic type change in the cerebral white matter. 3. Mild right frontal ethmoid sinusitis. Probable small left nasal cavity polyp.  Laboratory examination:    CMP Latest Ref Rng & Units 07/08/2019 07/08/2019 04/19/2019  Glucose 70 - 99 mg/dL 161(W) 960(A) 540(J)  BUN 8 - 23 mg/dL 21 19 18   Creatinine 0.61 - 1.24 mg/dL 8.11 9.14 7.82  Sodium 135 - 145 mmol/L 137 137 137  Potassium 3.5 - 5.1 mmol/L 4.5 4.4 4.3  Chloride 98 - 111 mmol/L 102 102 100  CO2 22 - 32 mmol/L - 25 24  Calcium 8.9 - 10.3 mg/dL - 11.1(H) 10.0  Total Protein 6.5 - 8.1 g/dL - 7.6 -  Total Bilirubin 0.3 - 1.2 mg/dL - 1.3(H) -  Alkaline Phos 38 - 126 U/L - 53 -  AST 15 - 41 U/L - 26 -  ALT 0 - 44 U/L - 27 -   CBC Latest Ref Rng & Units 07/08/2019 07/08/2019 07/25/2016  WBC 4.0 - 10.5 K/uL - 7.3 12.1(H)  Hemoglobin 13.0 - 17.0 g/dL 95.6 21.3 08.6  Hematocrit 39.0 - 52.0 % 44.0 42.9 39.4  Platelets 150 - 400 K/uL - 174 139(L)   Lipid Panel  No results found for: CHOL, TRIG, HDL, CHOLHDL, VLDL, LDLCALC, LDLDIRECT HEMOGLOBIN A1C Lab Results  Component Value Date   HGBA1C 5.8 (H) 07/12/2016   MPG 120 07/12/2016    TSH No results for input(s): TSH in the last 8760 hours.  PRN Meds:. There are no discontinued medications. Current Meds  Medication Sig  . ALPRAZolam (XANAX) 0.5 MG tablet Take 0.5 mg by mouth at bedtime as needed for anxiety.  Marland Kitchen amLODipine (NORVASC) 5 MG tablet Take 5 mg by mouth 2 (two) times daily.  Marland Kitchen aspirin EC 81 MG tablet Take 81 mg by mouth daily.  . carvedilol (COREG) 12.5 MG tablet Take 1 tablet (12.5 mg total) by mouth 2 (two) times daily with a meal.  . cholecalciferol (VITAMIN D3) 25 MCG (1000 UT) tablet Take 1,000 Units by mouth daily.  . clopidogrel (PLAVIX) 75 MG tablet Take 1 tablet (75 mg total) by mouth daily.  . diclofenac (VOLTAREN) 50 MG EC tablet Take 50 mg by mouth daily as needed for mild pain.   Marland Kitchen doxycycline (VIBRA-TABS) 100 MG tablet Take 100 mg by mouth daily as needed (for adult acne).  . hydrochlorothiazide (HYDRODIURIL) 25 MG tablet Take 25 mg by mouth daily.  Marland Kitchen levothyroxine (SYNTHROID, LEVOTHROID) 100 MCG tablet Take 100 mcg by mouth daily before breakfast.  . metFORMIN (GLUCOPHAGE) 500 MG tablet Take 500 mg by mouth daily with breakfast.  . Multiple Vitamins-Minerals (MULTIVITAMIN WITH MINERALS) tablet Take 1 tablet by mouth daily.  Marland Kitchen olmesartan (BENICAR) 40 MG tablet Take 40 mg by mouth daily.  . pravastatin (PRAVACHOL) 20 MG tablet Take 20 mg by mouth at bedtime.  Marland Kitchen spironolactone (ALDACTONE) 50 MG tablet TAKE 1 TABLET ONCE DAILY. (Patient taking differently: Take 50 mg by mouth daily. )  . Testosterone 12.5 MG/ACT (1%) GEL Apply 4 Pump topically daily.  . valACYclovir HCl (VALTREX PO) Take 1,000 mg by mouth daily as needed (for herpes flare).   . vitamin B-12 (CYANOCOBALAMIN) 500 MCG tablet Take 500 mcg by mouth daily.  Marland Kitchen zinc gluconate 50 MG tablet Take 50 mg  by mouth daily.    Cardiac Studies:   Exercise myoview stress test  04/23/2019: Resting EKG NSR.  Stress EKG is negative for ischemia. There were occasional PVCs and paroxysmal episodes of  brief 3-8 beat atrial tachycardia during stress testing and into recovery. Patient exercised for a total of 6:13 min., achieving approximately 7 METs. Normal BP response. Exercise was terminated due to fatigue/weakness and achieving THR. Normal myocardial perfusion. All segments of left ventricle demonstrated normal wall motion and thickening. Stress LV EF is hyperdynamic 75%.  No previous exam available for comparison. Low risk study.   Echocardiogram 02/27/2019: Normal LV systolic function with EF 55%. Left ventricle cavity is normal in size. Normal left ventricular wall thickness. Normal global wall motion. Indeterminate diastolic filling pattern.  Mild tricuspid regurgitation. Estimated pulmonary artery systolic pressure is 26 mmHg.  21 day event monitor 07/07-07/27/2020: NSR with occasional PAC/PVC. 1 patient triggered event without reported symptoms correlated with NSR with PAC and PVC. 2 auto detected events occurred for NSR with 5 beats of NSVT a t 173 bpm on day 1 at 1453 and NSR with 6 beats of NSVT on day 12 at 1651. Both were asymptomatic. 1 auto detected event on day 14 for Sinus tachycardia with frequent PAC's. No A fib was noted.  Assessment:   No diagnosis found.  EKG 02/13/2019: Normal sinus rhythm at 75 bpm with 2 PAC's, normal axis, borderline criteria for LVH, no evidence of ischemia.   Recommendations:   CT scan and MRI from the emergency room were reviewed.  No evidence of cardio embolic event.  He was found to have chronic small vessel changes on MRI that I feel is the more likely etiology for his symptoms.  Do not feel that he needs TEE or repeat echocardiogram at this point, last echo was in July 2020.  I do feel that it is reasonable to repeat event monitor for 30 days given his recent TIA.  He is high risk for atrial fibrillation given sleep apnea, hypertension, and frequent PACs on previous event monitor. Fortunately, he has not had recurrent symptoms or other symptoms  of TIA.  I would recommend aggressive risk factor modification.  Blood pressure is well controlled.  He is currently on pravastatin for hyperlipidemia.  Will obtain lipid panel, as this does not appear to have been recently evaluated.  I will discontinue pravastatin and changed to Crestor 20 mg for now, he may require higher dose.  He is currently on aspirin and Plavix and he will discuss with neurology at his follow-up if he should continue with Plavix.  He is currently not using CPAP given his recent episode, I have encouraged him to reach out to Dr. Vickey Huger if he should resume this.  I will plan to see him back in 6 weeks for follow-up, but encouraged him to contact me sooner if needed.   *I have discussed this case with Dr. Rosemary Holms and he participated in formulating the plan.*    Toniann Fail, MSN, APRN, FNP-C [facility] Office: [phone] Fax: [phone]

## 2019-07-10 NOTE — Telephone Encounter (Signed)
Download that was printed given to Dr. Brett Fairy to review.

## 2019-07-10 NOTE — Telephone Encounter (Signed)
Norman Hudson, where did you put it ?

## 2019-07-11 ENCOUNTER — Telehealth: Payer: Self-pay | Admitting: Neurology

## 2019-07-11 ENCOUNTER — Encounter: Payer: Self-pay | Admitting: Cardiology

## 2019-07-11 ENCOUNTER — Other Ambulatory Visit: Payer: Self-pay

## 2019-07-11 ENCOUNTER — Ambulatory Visit (INDEPENDENT_AMBULATORY_CARE_PROVIDER_SITE_OTHER): Payer: Medicare HMO | Admitting: Cardiology

## 2019-07-11 ENCOUNTER — Ambulatory Visit: Payer: Medicare HMO

## 2019-07-11 VITALS — BP 124/75 | HR 66 | Temp 97.3°F | Ht 68.0 in | Wt 199.8 lb

## 2019-07-11 DIAGNOSIS — I10 Essential (primary) hypertension: Secondary | ICD-10-CM

## 2019-07-11 DIAGNOSIS — G459 Transient cerebral ischemic attack, unspecified: Secondary | ICD-10-CM

## 2019-07-11 DIAGNOSIS — I491 Atrial premature depolarization: Secondary | ICD-10-CM

## 2019-07-11 DIAGNOSIS — Z8673 Personal history of transient ischemic attack (TIA), and cerebral infarction without residual deficits: Secondary | ICD-10-CM | POA: Diagnosis not present

## 2019-07-11 DIAGNOSIS — E7800 Pure hypercholesterolemia, unspecified: Secondary | ICD-10-CM

## 2019-07-11 DIAGNOSIS — G4752 REM sleep behavior disorder: Secondary | ICD-10-CM

## 2019-07-11 DIAGNOSIS — G4733 Obstructive sleep apnea (adult) (pediatric): Secondary | ICD-10-CM

## 2019-07-11 DIAGNOSIS — G4719 Other hypersomnia: Secondary | ICD-10-CM

## 2019-07-11 DIAGNOSIS — Z9989 Dependence on other enabling machines and devices: Secondary | ICD-10-CM

## 2019-07-11 DIAGNOSIS — R0683 Snoring: Secondary | ICD-10-CM

## 2019-07-11 MED ORDER — ROSUVASTATIN CALCIUM 20 MG PO TABS: 20.0000 mg | ORAL_TABLET | Freq: Every day | ORAL | 3 refills | Status: DC

## 2019-07-11 NOTE — Telephone Encounter (Signed)
Norman Hudson used his CPAP compliantly for 91% of a 21-day period- including the 07-09-2019 date. .  Average user time 6 hours 52 minutes -he is using an auto CPAP titration set with a minimum pressure of 7 at the maximum pressure setting of 12 cmH2O and 1 cm EPR, his residual AHI was still 9.1/h !  More obstructive than central apneas were noted.  There seems to be still some breakthrough snoring.  The 95th percentile pressure was 11.4 cm water,  the air leaks were moderate.  I can only imagine that the upper pressure window is still too low to allow complete resolution of apnea at least 3/h are obstructive in nature and could be eliminated with a higher maximum pressure setting which I will arrange for today. Larey Seat, MD

## 2019-07-12 ENCOUNTER — Other Ambulatory Visit: Payer: Self-pay | Admitting: Cardiology

## 2019-07-12 DIAGNOSIS — G459 Transient cerebral ischemic attack, unspecified: Secondary | ICD-10-CM | POA: Diagnosis not present

## 2019-07-12 NOTE — Telephone Encounter (Signed)
Please review

## 2019-07-13 LAB — LIPID PANEL
Chol/HDL Ratio: 3 ratio (ref 0.0–5.0)
Cholesterol, Total: 139 mg/dL (ref 100–199)
HDL: 46 mg/dL (ref >39)
LDL Chol Calc (NIH): 72 mg/dL (ref 0–99)
Triglycerides: 115 mg/dL (ref 0–149)
VLDL Cholesterol Cal: 21 mg/dL (ref 5–40)

## 2019-07-15 ENCOUNTER — Encounter: Payer: Self-pay | Admitting: Neurology

## 2019-07-16 DIAGNOSIS — I679 Cerebrovascular disease, unspecified: Secondary | ICD-10-CM | POA: Diagnosis not present

## 2019-07-18 DIAGNOSIS — G4733 Obstructive sleep apnea (adult) (pediatric): Secondary | ICD-10-CM | POA: Diagnosis not present

## 2019-07-26 NOTE — Telephone Encounter (Signed)
Please read

## 2019-07-27 ENCOUNTER — Encounter: Payer: Self-pay | Admitting: Neurology

## 2019-08-05 ENCOUNTER — Other Ambulatory Visit: Payer: Self-pay | Admitting: Neurology

## 2019-08-07 ENCOUNTER — Other Ambulatory Visit: Payer: Self-pay

## 2019-08-07 MED ORDER — CLOPIDOGREL BISULFATE 75 MG PO TABS: 75.0000 mg | ORAL_TABLET | Freq: Every day | ORAL | 0 refills | Status: DC

## 2019-08-08 ENCOUNTER — Other Ambulatory Visit: Payer: Self-pay | Admitting: Cardiology

## 2019-08-13 ENCOUNTER — Ambulatory Visit: Payer: Medicare HMO | Admitting: Neurology

## 2019-08-13 ENCOUNTER — Other Ambulatory Visit: Payer: Self-pay

## 2019-08-13 ENCOUNTER — Encounter: Payer: Self-pay | Admitting: Neurology

## 2019-08-13 VITALS — BP 124/78 | HR 59 | Temp 97.8°F | Ht 68.0 in | Wt 205.0 lb

## 2019-08-13 DIAGNOSIS — Z950 Presence of cardiac pacemaker: Secondary | ICD-10-CM | POA: Diagnosis not present

## 2019-08-13 DIAGNOSIS — R0683 Snoring: Secondary | ICD-10-CM | POA: Diagnosis not present

## 2019-08-13 DIAGNOSIS — G4752 REM sleep behavior disorder: Secondary | ICD-10-CM | POA: Diagnosis not present

## 2019-08-13 DIAGNOSIS — G4719 Other hypersomnia: Secondary | ICD-10-CM

## 2019-08-13 NOTE — Patient Instructions (Signed)
Clopidogrel tablets What is this medicine? CLOPIDOGREL (kloh PID oh grel) helps to prevent blood clots. This medicine is used to prevent heart attack, stroke, or other vascular events in people who are at high risk. This medicine may be used for other purposes; ask your health care provider or pharmacist if you have questions. COMMON BRAND NAME(S): Plavix What should I tell my health care provider before I take this medicine? They need to know if you have any of the following conditions:  bleeding disorders  bleeding in the brain  having surgery  history of stomach bleeding  an unusual or allergic reaction to clopidogrel, other medicines, foods, dyes, or preservatives  pregnant or trying to get pregnant  breast-feeding How should I use this medicine? Take this medicine by mouth with a glass of water. Follow the directions on the prescription label. You may take this medicine with or without food. If it upsets your stomach, take it with food. Take your medicine at regular intervals. Do not take it more often than directed. Do not stop taking except on your doctor's advice. A special MedGuide will be given to you by the pharmacist with each prescription and refill. Be sure to read this information carefully each time. Talk to your pediatrician regarding the use of this medicine in children. Special care may be needed. Overdosage: If you think you have taken too much of this medicine contact a poison control center or emergency room at once. NOTE: This medicine is only for you. Do not share this medicine with others. What if I miss a dose? If you miss a dose, take it as soon as you can. If it is almost time for your next dose, take only that dose. Do not take double or extra doses. What may interact with this medicine? Do not take this medicine with the following medications:  dasabuvir; ombitasvir; paritaprevir; ritonavir  defibrotide  selexipag This medicine may also interact with  the following medications:  certain medicines that treat or prevent blood clots like warfarin  narcotic medicines for pain  NSAIDs, medicines for pain and inflammation, like ibuprofen or naproxen  repaglinide  SNRIs, medicines for depression, like desvenlafaxine, duloxetine, levomilnacipran, venlafaxine  SSRIs, medicines for depression, like citalopram, escitalopram, fluoxetine, fluvoxamine, paroxetine, sertraline  stomach acid blockers like cimetidine, esomeprazole, omeprazole This list may not describe all possible interactions. Give your health care provider a list of all the medicines, herbs, non-prescription drugs, or dietary supplements you use. Also tell them if you smoke, drink alcohol, or use illegal drugs. Some items may interact with your medicine. What should I watch for while using this medicine? Visit your doctor or health care professional for regular check-ups. Do not stop taking your medicine unless your doctor tells you to. Notify your doctor or health care professional and seek emergency treatment if you develop breathing problems; changes in vision; chest pain; severe, sudden headache; pain, swelling, warmth in the leg; trouble speaking; sudden numbness or weakness of the face, arm or leg. These can be signs that your condition has gotten worse. If you are going to have surgery or dental work, tell your doctor or health care professional that you are taking this medicine. Certain genetic factors may reduce the effect of this medicine. Your doctor may use genetic tests to determine treatment. Only take aspirin if you are instructed to. Low doses of aspirin are used with this medicine to treat some conditions. Taking aspirin with this medicine can increase your risk of bleeding so   you must be careful. Talk to your doctor or pharmacist if you have questions. What side effects may I notice from receiving this medicine? Side effects that you should report to your doctor or  health care professional as soon as possible:  allergic reactions like skin rash, itching or hives, swelling of the face, lips, or tongue  signs and symptoms of bleeding such as bloody or black, tarry stools; red or dark-brown urine; spitting up blood or brown material that looks like coffee grounds; red spots on the skin; unusual bruising or bleeding from the eye, gums, or nose  signs and symptoms of a blood clot such as breathing problems; changes in vision; chest pain; severe, sudden headache; pain, swelling, warmth in the leg; trouble speaking; sudden numbness or weakness of the face, arm or leg  signs and symptoms of low blood sugar such as feeling anxious; confusion; dizziness; increased hunger; unusually weak or tired; increased sweating; shakiness; cold, clammy skin; irritable; headache; blurred vision; fast heartbeat; loss of consciousness Side effects that usually do not require medical attention (report to your doctor or health care professional if they continue or are bothersome):  constipation  diarrhea  headache  upset stomach This list may not describe all possible side effects. Call your doctor for medical advice about side effects. You may report side effects to FDA at 1-800-FDA-1088. Where should I keep my medicine? Keep out of the reach of children. Store at room temperature of 59 to 86 degrees F (15 to 30 degrees C). Throw away any unused medicine after the expiration date. NOTE: This sheet is a summary. It may not cover all possible information. If you have questions about this medicine, talk to your doctor, pharmacist, or health care provider.  2020 Elsevier/Gold Standard (2017-12-26 15:03:38)  
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
## 2019-08-18 DIAGNOSIS — G4733 Obstructive sleep apnea (adult) (pediatric): Secondary | ICD-10-CM | POA: Diagnosis not present

## 2019-08-20 DIAGNOSIS — R0902 Hypoxemia: Secondary | ICD-10-CM | POA: Diagnosis not present

## 2019-08-20 DIAGNOSIS — J449 Chronic obstructive pulmonary disease, unspecified: Secondary | ICD-10-CM | POA: Diagnosis not present

## 2019-08-21 ENCOUNTER — Encounter: Payer: Self-pay | Admitting: Neurology

## 2019-08-21 ENCOUNTER — Telehealth: Payer: Self-pay | Admitting: Neurology

## 2019-08-21 NOTE — Telephone Encounter (Signed)
Dr Brett Fairy was able to review the ONO results. Based off these findings the patient oxygen level is maintaining well on CPAP. There was no concerns of hypoxemia.

## 2019-08-21 NOTE — Telephone Encounter (Signed)
Received a ONO that was completed through [facility] while the patient was on CPAP. I have placed in the result bin for Dr Dohmeier to review and will inform patient of the results.

## 2019-08-23 ENCOUNTER — Ambulatory Visit: Payer: Self-pay | Admitting: Family Medicine

## 2019-08-24 ENCOUNTER — Encounter: Payer: Self-pay | Admitting: Cardiology

## 2019-08-24 ENCOUNTER — Other Ambulatory Visit: Payer: Self-pay

## 2019-08-24 ENCOUNTER — Ambulatory Visit: Payer: Medicare HMO | Admitting: Cardiology

## 2019-08-24 VITALS — BP 131/75 | HR 61 | Temp 97.6°F | Ht 67.0 in | Wt 201.4 lb

## 2019-08-24 DIAGNOSIS — G459 Transient cerebral ischemic attack, unspecified: Secondary | ICD-10-CM

## 2019-08-24 DIAGNOSIS — E7800 Pure hypercholesterolemia, unspecified: Secondary | ICD-10-CM

## 2019-08-24 DIAGNOSIS — I10 Essential (primary) hypertension: Secondary | ICD-10-CM | POA: Diagnosis not present

## 2019-08-24 NOTE — Progress Notes (Signed)
Primary Physician:  Shon Hale, MD   Patient ID: Norman Hudson., male    DOB: Jun 29, 1951, 69 y.o.   MRN: [id number]  Subjective:    Chief Complaint  Patient presents with  . Hypertension  . Follow-up    event monitor    HPI: Norman Hudson.  is a 69 y.o. male  with prediabetes, hypertension, hyperlipidemia, chronically low testosterone, hypothyroidism, former smoker with 15 pack year history, anxiety, osteoarthritis, and recently evaluated by [place] for irregular heart beat and hypertension.   Patient underwent echocardiogram on 02/27/2019 revealing normal LVEF.  21-day event monitor was also performed showing occasional PACs and PVCs and two episodes of NSVT that were asymptomatic. In view of NSVT, he underwent exercise nuclear stress testing in Sept 2020 that was considered low risk study.   He developed TIA with symptoms of aphasia on 07/08/19.Marland KitchenAphasia resolved prior to EMS transportation. MRI and CTA unyielding. He was started on Plavix. Chronic small vessel disease noted on MRI was felt to be the etiology. He was placed on 30 day event monitor to evaluate for A fib and now presents to discuss results.   He has not had any recurrence of aphasia. Patient feeling well, no complaints today.  Blood pressure has continued to be much better controlled. Tolerating medications well.   He has found to have sleep apnea and is now using CPAP. He has not noted any significant improvement of symptoms since being on CPAP, but is tolerating this better.  No alcohol use or illicit drug use. Patient is mostly sedentary, but is working to lose weight. 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   Past Medical History:  Diagnosis Date  . Acne   . Anxiety    takes Xanax daily as needed  . Arthritis   . Diabetes mellitus without complication (HCC)    takes Metformin daily-per pt prediabetes  . History of kidney stones   .  Hyperlipidemia    takes Pravastatin daily  . Hypertension    takes Amlodipine and Valsartan-HCTZ daily  . Hypothyroidism    takes Synthroid daily  . Inguinal hernia    left side  . Internal hemorrhoids   . Joint pain   . Rosacea     Past Surgical History:  Procedure Laterality Date  . COLONOSCOPY    . LITHOTRIPSY  09/2015  . LUMBAR LAMINECTOMY WITH COFLEX 1 LEVEL N/A 07/07/2015   Procedure: LUMBAR FOUR-FIVE LUMBAR LAMINECTOMY WITH COFLEX;  Surgeon: Tia Alert, MD;  Location: [facility];  Service: Neurosurgery;  Laterality: N/A;  . TONSILLECTOMY    . 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 Marital status: Married    Spouse name: Not on file  . Number of children: 2  . Years of education: Not on file  . Highest education level: Bachelor's degree (e.g., BA, AB, BS)  Occupational History  . Not on file  Tobacco Use  . Smoking status: Former Smoker    Packs/day: 1.00    Years: 15.00    Pack years: 15.00    Types: Cigarettes    Quit date: 02/12/1989    Years since quitting: 30.5  . Smokeless tobacco: Never Used  . Tobacco comment: quit smoking in 1991  Substance and Sexual Activity  .  Alcohol use: No  . Drug use: No  . Sexual activity: Not on file  Other Topics Concern  . Not on file  Social History Narrative   Lives at home with wife   4 cups of caffeine/day   Social Determinants of Health   Financial Resource Strain:   . Difficulty of Paying Living Expenses: Not on file  Food Insecurity:   . Worried About Programme researcher, broadcasting/film/video in the Last Year: Not on file  . Ran Out of Food in the Last Year: Not on file  Transportation Needs:   . Lack of Transportation (Medical): Not on file  . Lack of Transportation (Non-Medical): Not on file  Physical Activity:   . Days of Exercise per Week: Not on  file  . Minutes of Exercise per Session: Not on file  Stress:   . Feeling of Stress : Not on file  Social Connections:   . Frequency of Communication with Friends and Family: Not on file  . Frequency of Social Gatherings with Friends and Family: Not on file  . Attends Religious Services: Not on file  . Active Member of Clubs or Organizations: Not on file  . Attends Banker Meetings: Not on file  . Marital Status: Not on file  Intimate Partner Violence:   . Fear of Current or Ex-Partner: Not on file  . Emotionally Abused: Not on file  . Physically Abused: Not on file  . Sexually Abused: Not on file    Review of Systems  Constitution: Negative for decreased appetite, malaise/fatigue, weight gain and weight loss.  Eyes: Negative for visual disturbance.  Cardiovascular: Negative for chest pain, claudication, dyspnea on exertion, leg swelling, orthopnea, palpitations and syncope.  Respiratory: Negative for hemoptysis and wheezing.   Endocrine: Negative for cold intolerance and heat intolerance.  Hematologic/Lymphatic: Does not bruise/bleed easily.  Skin: Negative for nail changes.  Musculoskeletal: Positive for arthritis. Negative for muscle weakness and myalgias.  Gastrointestinal: Negative for abdominal pain, change in bowel habit, nausea and vomiting.  Neurological: Negative for difficulty with concentration, dizziness, focal weakness and headaches.  Psychiatric/Behavioral: Negative for altered mental status and suicidal ideas.  All other systems reviewed and are negative.     Objective:  Blood pressure 131/75, pulse 61, temperature 97.6 F (36.4 C), height 5\' 7"  (1.702 m), weight 201 lb 6.4 oz (91.4 kg), SpO2 99 %. Body mass index is 31.54 kg/m.    Physical Exam  Constitutional: He is oriented to person, place, and time. Vital signs are normal. He appears well-developed and well-nourished.  HENT:  Head: Normocephalic and atraumatic.  Cardiovascular: Normal rate,  regular rhythm, normal heart sounds and intact distal pulses.  Pulses:      Femoral pulses are 2+ on the right side and 2+ on the left side.      Popliteal pulses are 1+ on the right side and 1+ on the left side.       Dorsalis pedis pulses are 1+ on the right side and 1+ on the left side.       Posterior tibial pulses are 1+ on the right side and 1+ on the left side.  Trace leg edema  Pulmonary/Chest: Effort normal and breath sounds normal. No accessory muscle usage. No respiratory distress.  Abdominal: Soft. Bowel sounds are normal.  Musculoskeletal:        General: Normal range of motion.     Cervical back: Normal range of motion.  Neurological: He is alert and oriented to  person, place, and time.  Skin: Skin is warm and dry.  Vitals reviewed.  Radiology:  MRI of brain 07/08/2019:  1. No emergent finding including infarct. 2. Mild chronic small vessel ischemic type change in the cerebral white matter. 3. Mild right frontal ethmoid sinusitis. Probable small left nasal cavity polyp.  Laboratory examination:    CMP Latest Ref Rng & Units 07/08/2019 07/08/2019 04/19/2019  Glucose 70 - 99 mg/dL 469(G) 295(M) 841(L)  BUN 8 - 23 mg/dL 21 19 18   Creatinine 0.61 - 1.24 mg/dL 2.44 0.10 2.72  Sodium 135 - 145 mmol/L 137 137 137  Potassium 3.5 - 5.1 mmol/L 4.5 4.4 4.3  Chloride 98 - 111 mmol/L 102 102 100  CO2 22 - 32 mmol/L - 25 24  Calcium 8.9 - 10.3 mg/dL - 11.1(H) 10.0  Total Protein 6.5 - 8.1 g/dL - 7.6 -  Total Bilirubin 0.3 - 1.2 mg/dL - 1.3(H) -  Alkaline Phos 38 - 126 U/L - 53 -  AST 15 - 41 U/L - 26 -  ALT 0 - 44 U/L - 27 -   CBC Latest Ref Rng & Units 07/08/2019 07/08/2019 07/25/2016  WBC 4.0 - 10.5 K/uL - 7.3 12.1(H)  Hemoglobin 13.0 - 17.0 g/dL 53.6 64.4 03.4  Hematocrit 39.0 - 52.0 % 44.0 42.9 39.4  Platelets 150 - 400 K/uL - 174 139(L)   Lipid Panel     Component Value Date/Time   CHOL 139 07/12/2019 1026   TRIG 115 07/12/2019 1026   HDL 46 07/12/2019 1026    CHOLHDL 3.0 07/12/2019 1026   LDLCALC 72 07/12/2019 1026   HEMOGLOBIN A1C Lab Results  Component Value Date   HGBA1C 5.8 (H) 07/12/2016   MPG 120 07/12/2016   TSH No results for input(s): TSH in the last 8760 hours.  PRN Meds:. There are no discontinued medications. Current Meds  Medication Sig  . ALPRAZolam (XANAX) 0.5 MG tablet Take 0.5 mg by mouth at bedtime as needed for anxiety.  Marland Kitchen amLODipine (NORVASC) 5 MG tablet Take 5 mg by mouth 2 (two) times daily.  Marland Kitchen aspirin EC 81 MG tablet Take 81 mg by mouth daily.  . carvedilol (COREG) 12.5 MG tablet Take 1 tablet (12.5 mg total) by mouth 2 (two) times daily with a meal.  . cholecalciferol (VITAMIN D3) 25 MCG (1000 UT) tablet Take 1,000 Units by mouth daily.  . clopidogrel (PLAVIX) 75 MG tablet Take 1 tablet (75 mg total) by mouth daily.  . diclofenac (VOLTAREN) 50 MG EC tablet Take 50 mg by mouth daily as needed for mild pain.   Marland Kitchen doxycycline (VIBRA-TABS) 100 MG tablet Take 100 mg by mouth daily as needed (for adult acne).  . hydrochlorothiazide (HYDRODIURIL) 25 MG tablet Take 25 mg by mouth daily.  Marland Kitchen levothyroxine (SYNTHROID, LEVOTHROID) 100 MCG tablet Take 100 mcg by mouth daily before breakfast.  . metFORMIN (GLUCOPHAGE) 500 MG tablet Take 500 mg by mouth daily with breakfast.  . Multiple Vitamins-Minerals (MULTIVITAMIN WITH MINERALS) tablet Take 1 tablet by mouth daily.  Marland Kitchen olmesartan (BENICAR) 40 MG tablet Take 40 mg by mouth daily.  . Pyridoxine HCl (VITAMIN B-6 PO) Take 100 mg by mouth daily.  . rosuvastatin (CRESTOR) 20 MG tablet Take 1 tablet (20 mg total) by mouth at bedtime.  Marland Kitchen spironolactone (ALDACTONE) 50 MG tablet TAKE 1 TABLET ONCE DAILY.  Marland Kitchen Testosterone 12.5 MG/ACT (1%) GEL Apply 4 Pump topically daily.  . valACYclovir HCl (VALTREX PO) Take 1,000 mg by mouth daily  as needed (for herpes flare).   . vitamin B-12 (CYANOCOBALAMIN) 500 MCG tablet Take 500 mcg by mouth daily.  Marland Kitchen zinc gluconate 50 MG tablet Take 50 mg by  mouth daily.    Cardiac Studies:   Event Monitor for 30 days Start date 07/11/2019: There were no symptomatic triggered events. Predominant rhythm is normal sinus rhythm. Rare PACs and PVCs. No atrial fibrillation.  Exercise myoview stress test  04/23/2019: Resting EKG NSR.  Stress EKG is negative for ischemia. There were occasional PVCs and paroxysmal episodes of brief 3-8 beat atrial tachycardia during stress testing and into recovery. Patient exercised for a total of 6:13 min., achieving approximately 7 METs. Normal BP response. Exercise was terminated due to fatigue/weakness and achieving THR. Normal myocardial perfusion. All segments of left ventricle demonstrated normal wall motion and thickening. Stress LV EF is hyperdynamic 75%.  No previous exam available for comparison. Low risk study.   Echocardiogram 02/27/2019: Normal LV systolic function with EF 55%. Left ventricle cavity is normal in size. Normal left ventricular wall thickness. Normal global wall motion. Indeterminate diastolic filling pattern.  Mild tricuspid regurgitation. Estimated pulmonary artery systolic pressure is 26 mmHg.  21 day event monitor 07/07-07/27/2020: NSR with occasional PAC/PVC. 1 patient triggered event without reported symptoms correlated with NSR with PAC and PVC. 2 auto detected events occurred for NSR with 5 beats of NSVT a t 173 bpm on day 1 at 1453 and NSR with 6 beats of NSVT on day 12 at 1651. Both were asymptomatic. 1 auto detected event on day 14 for Sinus tachycardia with frequent PAC's. No A fib was noted.  Assessment:   No diagnosis found.  EKG 02/13/2019: Normal sinus rhythm at 75 bpm with 2 PAC's, normal axis, borderline criteria for LVH, no evidence of ischemia.   Recommendations:   I have reviewed and discussed recent event monitor results, no atrial fibrillation was noted. Continue to feel that his TIA was related to chronic atherosclerosis.  Blood pressure remains well controlled,  he has actually had a few episodes of slightly low blood pressure in the low 100s.  He will try decreasing his dose of Aldactone down to 25 mg daily. He will closely monitor his blood pressure and notify me in the next few weeks on how his blood pressure is running with the lower dose. Recommend continued low sodium diet and efforts toward weight loss.   He is scheduled to see his PCP next month, would recommend obtaining lipid profile testing along with CMP for evaluation of hyperlipidemia since being on Crestor. Would recommend continuing with high intensity statin in view of atherosclerosis.  He will continue with plavix and ASA for now and potentially stop Plavix in May as directed by Neurology. He will also continue to follow up with Dr. Vickey Huger regarding sleep apnea management. I will plan to see him back in 6 months or sooner if needed.     Toniann Fail, MSN, APRN, FNP-C [facility] [place] Office: [phone] Fax: [phone]

## 2019-08-28 ENCOUNTER — Ambulatory Visit: Payer: Medicare Other | Attending: Internal Medicine

## 2019-08-28 DIAGNOSIS — Z23 Encounter for immunization: Secondary | ICD-10-CM | POA: Insufficient documentation

## 2019-08-28 NOTE — Progress Notes (Signed)
[id number]  Name:  Norman Hudson.    MRN: [id number] DOB: 03-23-1951  08/28/2019  [name] was observed post Covid-19 immunization for 15 minutes without incidence. He was provided with Vaccine Information Sheet and instruction to access the V-Safe system.   [name] was instructed to call 911 with any severe reactions post vaccine: Marland Kitchen Difficulty breathing  . Swelling of your face and throat  . A fast heartbeat  . A bad rash all over your body  . Dizziness and weakness    Immunizations Administered    Name Date Dose VIS Date Route   Pfizer COVID-19 Vaccine 08/28/2019  2:45 PM 0.3 mL 07/20/2019 Intramuscular   Manufacturer: [employer], [employer]   Lot: [id number]   NDC: [address]-1

## 2019-09-03 DIAGNOSIS — H35033 Hypertensive retinopathy, bilateral: Secondary | ICD-10-CM | POA: Diagnosis not present

## 2019-09-03 DIAGNOSIS — E119 Type 2 diabetes mellitus without complications: Secondary | ICD-10-CM | POA: Diagnosis not present

## 2019-09-03 DIAGNOSIS — H2513 Age-related nuclear cataract, bilateral: Secondary | ICD-10-CM | POA: Diagnosis not present

## 2019-09-03 DIAGNOSIS — H25013 Cortical age-related cataract, bilateral: Secondary | ICD-10-CM | POA: Diagnosis not present

## 2019-09-05 ENCOUNTER — Other Ambulatory Visit: Payer: Self-pay | Admitting: Cardiology

## 2019-09-07 DIAGNOSIS — Z125 Encounter for screening for malignant neoplasm of prostate: Secondary | ICD-10-CM | POA: Diagnosis not present

## 2019-09-07 DIAGNOSIS — R7303 Prediabetes: Secondary | ICD-10-CM | POA: Diagnosis not present

## 2019-09-07 DIAGNOSIS — E039 Hypothyroidism, unspecified: Secondary | ICD-10-CM | POA: Diagnosis not present

## 2019-09-07 DIAGNOSIS — I10 Essential (primary) hypertension: Secondary | ICD-10-CM | POA: Diagnosis not present

## 2019-09-07 DIAGNOSIS — Z0000 Encounter for general adult medical examination without abnormal findings: Secondary | ICD-10-CM | POA: Diagnosis not present

## 2019-09-07 DIAGNOSIS — E7800 Pure hypercholesterolemia, unspecified: Secondary | ICD-10-CM | POA: Diagnosis not present

## 2019-09-10 ENCOUNTER — Ambulatory Visit: Payer: Medicare HMO

## 2019-09-12 DIAGNOSIS — E039 Hypothyroidism, unspecified: Secondary | ICD-10-CM | POA: Diagnosis not present

## 2019-09-12 DIAGNOSIS — I10 Essential (primary) hypertension: Secondary | ICD-10-CM | POA: Diagnosis not present

## 2019-09-12 DIAGNOSIS — Z125 Encounter for screening for malignant neoplasm of prostate: Secondary | ICD-10-CM | POA: Diagnosis not present

## 2019-09-12 DIAGNOSIS — R7303 Prediabetes: Secondary | ICD-10-CM | POA: Diagnosis not present

## 2019-09-17 ENCOUNTER — Ambulatory Visit: Payer: Medicare HMO

## 2019-09-18 ENCOUNTER — Ambulatory Visit: Payer: Medicare HMO | Attending: Internal Medicine

## 2019-09-18 ENCOUNTER — Ambulatory Visit: Payer: Medicare HMO

## 2019-09-18 DIAGNOSIS — G4733 Obstructive sleep apnea (adult) (pediatric): Secondary | ICD-10-CM | POA: Diagnosis not present

## 2019-09-18 DIAGNOSIS — Z23 Encounter for immunization: Secondary | ICD-10-CM | POA: Insufficient documentation

## 2019-09-18 NOTE — Progress Notes (Signed)
[id number]  Name:  Norman Hudson.    MRN: [id number] DOB: July 14, 1951  09/18/2019  Norman Hudson was observed post Covid-19 immunization for 15 minutes without incidence. He was provided with Vaccine Information Sheet and instruction to access the V-Safe system.   Norman Hudson was instructed to call 911 with any severe reactions post vaccine: Marland Kitchen Difficulty breathing  . Swelling of your face and throat  . A fast heartbeat  . A bad rash all over your body  . Dizziness and weakness    Immunizations Administered    Name Date Dose VIS Date Route   Pfizer COVID-19 Vaccine 09/18/2019  8:29 AM 0.3 mL 07/20/2019 Intramuscular   Manufacturer: [employer], [employer]   Lot: [id number]   NDC: [address]-4

## 2019-09-19 DIAGNOSIS — R69 Illness, unspecified: Secondary | ICD-10-CM | POA: Diagnosis not present

## 2019-09-20 DIAGNOSIS — G4733 Obstructive sleep apnea (adult) (pediatric): Secondary | ICD-10-CM | POA: Diagnosis not present

## 2019-10-01 ENCOUNTER — Other Ambulatory Visit: Payer: Self-pay | Admitting: Cardiology

## 2019-10-02 ENCOUNTER — Other Ambulatory Visit: Payer: Self-pay

## 2019-10-02 MED ORDER — SPIRONOLACTONE 25 MG PO TABS: 25.0000 mg | ORAL_TABLET | Freq: Every day | ORAL | 0 refills | Status: DC

## 2019-10-03 ENCOUNTER — Ambulatory Visit: Payer: Medicare HMO

## 2019-10-16 DIAGNOSIS — G4733 Obstructive sleep apnea (adult) (pediatric): Secondary | ICD-10-CM | POA: Diagnosis not present

## 2019-10-23 ENCOUNTER — Other Ambulatory Visit: Payer: Self-pay | Admitting: Cardiology

## 2019-10-24 DIAGNOSIS — R300 Dysuria: Secondary | ICD-10-CM | POA: Diagnosis not present

## 2019-10-24 DIAGNOSIS — N411 Chronic prostatitis: Secondary | ICD-10-CM | POA: Diagnosis not present

## 2019-10-29 ENCOUNTER — Ambulatory Visit: Payer: Medicare HMO | Admitting: Cardiology

## 2019-11-07 DIAGNOSIS — R300 Dysuria: Secondary | ICD-10-CM | POA: Diagnosis not present

## 2019-11-07 DIAGNOSIS — R3121 Asymptomatic microscopic hematuria: Secondary | ICD-10-CM | POA: Diagnosis not present

## 2019-11-07 DIAGNOSIS — N411 Chronic prostatitis: Secondary | ICD-10-CM | POA: Diagnosis not present

## 2019-11-13 DIAGNOSIS — N202 Calculus of kidney with calculus of ureter: Secondary | ICD-10-CM | POA: Diagnosis not present

## 2019-11-13 DIAGNOSIS — R3121 Asymptomatic microscopic hematuria: Secondary | ICD-10-CM | POA: Diagnosis not present

## 2019-11-16 DIAGNOSIS — G4733 Obstructive sleep apnea (adult) (pediatric): Secondary | ICD-10-CM | POA: Diagnosis not present

## 2019-11-20 ENCOUNTER — Encounter: Payer: Self-pay | Admitting: Neurology

## 2019-11-20 ENCOUNTER — Other Ambulatory Visit: Payer: Self-pay | Admitting: Neurology

## 2019-11-20 DIAGNOSIS — G4733 Obstructive sleep apnea (adult) (pediatric): Secondary | ICD-10-CM

## 2019-11-20 DIAGNOSIS — Z9989 Dependence on other enabling machines and devices: Secondary | ICD-10-CM

## 2019-12-13 ENCOUNTER — Ambulatory Visit: Payer: Medicare HMO | Admitting: Adult Health

## 2019-12-13 ENCOUNTER — Encounter: Payer: Self-pay | Admitting: Adult Health

## 2019-12-13 ENCOUNTER — Other Ambulatory Visit: Payer: Self-pay

## 2019-12-13 VITALS — BP 144/80 | HR 60 | Temp 97.8°F | Ht 67.0 in | Wt 211.6 lb

## 2019-12-13 DIAGNOSIS — G459 Transient cerebral ischemic attack, unspecified: Secondary | ICD-10-CM

## 2019-12-13 DIAGNOSIS — Z9989 Dependence on other enabling machines and devices: Secondary | ICD-10-CM | POA: Diagnosis not present

## 2019-12-13 DIAGNOSIS — G4733 Obstructive sleep apnea (adult) (pediatric): Secondary | ICD-10-CM | POA: Diagnosis not present

## 2019-12-13 NOTE — Progress Notes (Signed)
[facility] [address] [place]. [place] [address] [phone]       FOLLOW UP NOTE  [name]. Date of Birth:  [birth year] Medical Record Number:  [id number]   Reason for Referral: CPAP and TIA follow up    SUBJECTIVE:   CHIEF COMPLAINT:  Chief Complaint  Patient presents with  . Follow-up    cpap, rm    HPI:   Today, 12/13/2019, Norman Hudson is being seen for follow-up regarding history of TIA and sleep apnea. Brief TIA summary, presented to ED on 07/08/2019 after 5 minute episode of word finding difficulty with MRI and CTA unremarkable and initiated on Plavix in addition to aspirin. He also had initial evaluation with Dr. Vickey Huger on 08/13/2019 for sleep study evaluation which showed mix sleep apnea.   TIA: no reoccurring stroke/TIA symptoms. Continues on aspirin and plavix as instructed at prior visit without bleeding or bruising. He is questioning ongoing need of plavix. He was on aspirin prior to TIA.  Continues on Crestor 20 mg daily without myalgias.  Blood pressure today 144/80.  Repeat 30-day cardiac event monitor in 07/2019 - for atrial fibrillation.  Sleep apnea: CPAP compliance report from 11/12/2019 -12/11/2019 showed 27/30 usage days with 27 days greater than 4 hours for 90% compliance.  Average usage 7 hours and 24 minutes.  Residual AHI 8.7.  Apnea index central 1.1, obstructive 3.0, and unknown 1.5.  Leaks in the 95 percentile 17.8.  Pressure in the 95th percentile 14.5 on min pressure 7 and max pressure 16. Max pressure recently increased from 14 to16 approximately 3 weeks ago due to AHI 10.  He has tolerated increased pressure without difficulty and did show some improvement of residual AHI.  He does report occasional leaks due to seal issues and attempted to schedule visit with DME company [employer] but soonest available appointment 10 days out therefore he did not schedule visit.  He is concerned as he continues to experience  fatigue despite adequate compliance.       ROS:   14 system review of systems performed and negative with exception of see HPI  PMH:  Past Medical History:  Diagnosis Date  . Acne   . Anxiety    takes Xanax daily as needed  . Arthritis   . Diabetes mellitus without complication (HCC)    takes Metformin daily-per pt prediabetes  . History of kidney stones   . Hyperlipidemia    takes Pravastatin daily  . Hypertension    takes Amlodipine and Valsartan-HCTZ daily  . Hypothyroidism    takes Synthroid daily  . Inguinal hernia    left side  . Internal hemorrhoids   . Joint pain   . Rosacea     PSH:  Past Surgical History:  Procedure Laterality Date  . COLONOSCOPY    . LITHOTRIPSY  09/2015  . LUMBAR LAMINECTOMY WITH COFLEX 1 LEVEL N/A 07/07/2015   Procedure: LUMBAR FOUR-FIVE LUMBAR LAMINECTOMY WITH COFLEX;  Surgeon: Tia Alert, MD;  Location: [facility];  Service: Neurosurgery;  Laterality: N/A;  . TONSILLECTOMY    . 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 Marital status: Married    Spouse name: Not on file  . Number of children: 2  . Years of education: Not on file  .  Highest education level: Bachelor's degree (e.g., BA, AB, BS)  Occupational History  . Not on file  Tobacco Use  . Smoking status: Former Smoker    Packs/day: 1.00    Years: 15.00    Pack years: 15.00    Types: Cigarettes    Quit date: 02/12/1989    Years since quitting: 30.8  . Smokeless tobacco: Never Used  . Tobacco comment: quit smoking in 1991  Substance and Sexual Activity  . Alcohol use: No  . Drug use: No  . Sexual activity: Not on file  Other Topics Concern  . Not on file  Social History Narrative   Lives at home with wife   4 cups of caffeine/day   Social Determinants of Health    Financial Resource Strain:   . Difficulty of Paying Living Expenses:   Food Insecurity:   . Worried About Programme researcher, broadcasting/film/video in the Last Year:   . Barista in the Last Year:   Transportation Needs:   . Freight forwarder (Medical):   Marland Kitchen Lack of Transportation (Non-Medical):   Physical Activity:   . Days of Exercise per Week:   . Minutes of Exercise per Session:   Stress:   . Feeling of Stress :   Social Connections:   . Frequency of Communication with Friends and Family:   . Frequency of Social Gatherings with Friends and Family:   . Attends Religious Services:   . Active Member of Clubs or Organizations:   . Attends Banker Meetings:   Marland Kitchen Marital Status:   Intimate Partner Violence:   . Fear of Current or Ex-Partner:   . Emotionally Abused:   Marland Kitchen Physically Abused:   . Sexually Abused:     Family History:  Family History  Problem Relation Age of Onset  . Hypertension Father   . Sleep disorder Father   . CVA Father   . Cancer Maternal Grandfather   . Hypertension Paternal Grandmother   . Diabetes Paternal Grandmother   . Alcoholism Mother   . Hypertension Sister   . Aneurysm Sister   . Atrial fibrillation Sister   . Other Neg Hx        hypogonadism  . Sleep apnea Neg Hx     Medications:   Current Outpatient Medications on File Prior to Visit  Medication Sig Dispense Refill  . ALPRAZolam (XANAX) 0.5 MG tablet Take 0.5 mg by mouth at bedtime as needed for anxiety.    Marland Kitchen amLODipine (NORVASC) 5 MG tablet Take 5 mg by mouth 2 (two) times daily.    Marland Kitchen aspirin EC 81 MG tablet Take 81 mg by mouth daily.    . carvedilol (COREG) 12.5 MG tablet Take 1 tablet (12.5 mg total) by mouth 2 (two) times daily with a meal. 180 tablet 3  . cholecalciferol (VITAMIN D3) 25 MCG (1000 UT) tablet Take 1,000 Units by mouth daily.    . clopidogrel (PLAVIX) 75 MG tablet TAKE 1 TABLET ONCE DAILY. 90 tablet 0  . hydrochlorothiazide (HYDRODIURIL) 25 MG tablet Take 25 mg  by mouth daily.    Marland Kitchen levothyroxine (SYNTHROID, LEVOTHROID) 100 MCG tablet Take 100 mcg by mouth daily before breakfast.    . metFORMIN (GLUCOPHAGE) 500 MG tablet Take 500 mg by mouth daily with breakfast.    . Multiple Vitamins-Minerals (MULTIVITAMIN WITH MINERALS) tablet Take 1 tablet by mouth daily.    Marland Kitchen olmesartan (BENICAR) 40 MG tablet Take 40 mg by mouth daily.    Marland Kitchen  Pyridoxine HCl (VITAMIN B-6 PO) Take 100 mg by mouth daily.    . rosuvastatin (CRESTOR) 20 MG tablet TAKE ONE TABLET AT BEDTIME. 90 tablet 1  . silodosin (RAPAFLO) 4 MG CAPS capsule Take 4 mg by mouth at bedtime.    Marland Kitchen spironolactone (ALDACTONE) 25 MG tablet Take 1 tablet (25 mg total) by mouth daily. 90 tablet 0  . valACYclovir HCl (VALTREX PO) Take 1,000 mg by mouth daily as needed (for herpes flare).     . vitamin B-12 (CYANOCOBALAMIN) 500 MCG tablet Take 500 mcg by mouth daily.    Marland Kitchen zinc gluconate 50 MG tablet Take 50 mg by mouth daily.     No current facility-administered medications on file prior to visit.    Allergies:   Allergies  Allergen Reactions  . Bactrim [Sulfamethoxazole-Trimethoprim]     "threw me for a loop, hives, itching, extreme fatigue"  . Tramadol Itching and Rash      OBJECTIVE:  Physical Exam  Vitals:   12/13/19 1433  BP: (!) 144/80  Pulse: 60  Temp: 97.8 F (36.6 C)  SpO2: 99%  Weight: 211 lb 9.6 oz (96 kg)  Height: 5\' 7"  (1.702 m)   Body mass index is 33.14 kg/m. No exam data present  General: well developed, well nourished,  pleasant middle-age Caucasian male, seated, in no evident distress Head: head normocephalic and atraumatic.   Neck: supple with no carotid or supraclavicular bruits Cardiovascular: regular rate and rhythm, no murmurs Musculoskeletal: no deformity Skin:  no rash/petichiae Vascular:  Normal pulses all extremities   Neurologic Exam Mental Status: Awake and fully alert.   Fluent speech and language.  Oriented to place and time. Recent and remote memory  intact. Attention span, concentration and fund of knowledge appropriate. Mood and affect appropriate.  Cranial Nerves: Pupils equal, briskly reactive to light. Extraocular movements full without nystagmus. Visual fields full to confrontation. Hearing intact. Facial sensation intact. Face, tongue, palate moves normally and symmetrically.  Motor: Normal bulk and tone. Normal strength in all tested extremity muscles. Sensory.: intact to touch , pinprick , position and vibratory sensation.  Coordination: Rapid alternating movements normal in all extremities. Finger-to-nose and heel-to-shin performed accurately bilaterally. Gait and Station: Arises from chair without difficulty. Stance is normal. Gait demonstrates normal stride length and balance Reflexes: 1+ and symmetric. Toes downgoing.       ASSESSMENT: Norman Hudson. is a 69 y.o. year old male who is being seen today for follow-up regarding TIA in 06/2019 and CPAP compliance visit.  Stable from TIA standpoint without new or reoccurring symptoms.  Adequate CPAP compliance with residual AHI 8.7.     PLAN:  1. OSA on CPAP - For home use only DME continuous positive airway pressure (CPAP) -Increase max pressure from 16 to 18 as there was some improvement after recent increase from 14 to 16 -You will also follow-up with DME company to ensure adequate mask use and seal -He will call in 1 month to review compliance report after increased pressure.  May require titration study in the future for possible benefit of BiPAP but advised patient this will be further discussed with Dr. Vickey Huger at follow-up visit -Discussion regarding ongoing fatigue concerns despite adequate compliance.  Advised him that once residual AHI is satisfactory, his symptoms may take a few months to see improvement.  He verbalized understanding.   2. TIA (transient ischemic attack) -Continue clopidogrel 75 mg daily and Crestor for secondary stroke  prevention -Discontinue aspirin as prolonged  DAPT not indicated from a neurological standpoint.  Advised to follow-up with cardiology in case ongoing DAPT required from their standpoint -Continue to follow with PCP for HTN and HLD management -30-day cardiac event monitor negative for atrial fibrillation    Follow-up in 3 months with Dr. Vickey Huger or call earlier as needed   I spent 35 minutes of face-to-face and non-face-to-face time with patient.  This included previsit chart review, lab review, study review, order entry, electronic health record documentation, patient education regarding history of TIA, importance of managing stroke risk factors, review of CPAP compliance report and further discussion regarding treatment options and answered all questions to patient satisfaction    Ihor Austin, AGNP-BC  [facility] [address] [place], [place] [address]  Phone [phone] Fax [phone] Note: This document was prepared with digital dictation and possible smart phrase technology. Any transcriptional errors that result from this process are unintentional.

## 2019-12-13 NOTE — Patient Instructions (Addendum)
Your Plan:  Continue Plavix and Crestor for secondary stroke prevention  Discontinue aspirin at this time as prolonged use not indicated  Continue to follow with PCP for HTN, HLD and DM management  In regards to your CPAP, recommend increasing pressure to 18 as well as evaluation by [facility] to ensure proper mask fitting.  Call office in 1 month for assess for possible improvement of AHI.    Follow-up in 3 months with Dr. Brett Fairy or call earlier if needed     Thank you for coming to see [place] at [facility]. I hope we have been able to provide you high quality care today.  You may receive a patient satisfaction survey over the next few weeks. We would appreciate your feedback and comments so that we may continue to improve ourselves and the health of our patients.

## 2019-12-16 DIAGNOSIS — G4733 Obstructive sleep apnea (adult) (pediatric): Secondary | ICD-10-CM | POA: Diagnosis not present

## 2019-12-18 ENCOUNTER — Encounter: Payer: Self-pay | Admitting: Neurology

## 2019-12-19 DIAGNOSIS — G4733 Obstructive sleep apnea (adult) (pediatric): Secondary | ICD-10-CM | POA: Diagnosis not present

## 2019-12-21 ENCOUNTER — Other Ambulatory Visit: Payer: Self-pay | Admitting: Urology

## 2019-12-21 DIAGNOSIS — N200 Calculus of kidney: Secondary | ICD-10-CM

## 2019-12-21 DIAGNOSIS — N201 Calculus of ureter: Secondary | ICD-10-CM | POA: Diagnosis not present

## 2019-12-24 ENCOUNTER — Other Ambulatory Visit (HOSPITAL_COMMUNITY)
Admission: RE | Admit: 2019-12-24 | Discharge: 2019-12-24 | Disposition: A | Discharge status: Home / Self Care | Payer: Medicare HMO | Source: Ambulatory Visit | Attending: Urology | Admitting: Urology

## 2019-12-24 DIAGNOSIS — Z01812 Encounter for preprocedural laboratory examination: Secondary | ICD-10-CM | POA: Diagnosis not present

## 2019-12-24 DIAGNOSIS — Z20822 Contact with and (suspected) exposure to covid-19: Secondary | ICD-10-CM | POA: Diagnosis not present

## 2019-12-25 ENCOUNTER — Encounter (HOSPITAL_BASED_OUTPATIENT_CLINIC_OR_DEPARTMENT_OTHER): Payer: Self-pay | Admitting: Urology

## 2019-12-25 LAB — SARS CORONAVIRUS 2 (TAT 6-24 HRS): SARS Coronavirus 2: NEGATIVE

## 2019-12-25 NOTE — Progress Notes (Signed)
Patient to arrive at 1315 on 12/27/19. NPO after midnight, BP medications and synthroid morning of surgery with sip of water. Will hold metformin. Patient was instructed per urologist ok to continue plavix. Patient stated he decided to hold plavix until after his procedure. Took his last dose on 12/24/19 @ 1700. Pre-op instructions given. Will bring CPAP. Driver secured.

## 2019-12-26 NOTE — Progress Notes (Signed)
Patient aware that arrival time has been changed to 0800.

## 2019-12-27 ENCOUNTER — Other Ambulatory Visit: Payer: Self-pay

## 2019-12-27 ENCOUNTER — Ambulatory Visit (HOSPITAL_BASED_OUTPATIENT_CLINIC_OR_DEPARTMENT_OTHER)
Admission: RE | Admit: 2019-12-27 | Discharge: 2019-12-27 | Disposition: A | Discharge status: Home / Self Care | Payer: Medicare HMO | Attending: Urology | Admitting: Urology

## 2019-12-27 ENCOUNTER — Encounter (HOSPITAL_BASED_OUTPATIENT_CLINIC_OR_DEPARTMENT_OTHER)
Admission: RE | Disposition: A | Discharge status: Home / Self Care | Payer: Self-pay | Source: Home / Self Care | Attending: Urology

## 2019-12-27 ENCOUNTER — Ambulatory Visit (HOSPITAL_COMMUNITY): Payer: Medicare HMO

## 2019-12-27 ENCOUNTER — Encounter (HOSPITAL_BASED_OUTPATIENT_CLINIC_OR_DEPARTMENT_OTHER): Payer: Self-pay | Admitting: Urology

## 2019-12-27 DIAGNOSIS — N200 Calculus of kidney: Secondary | ICD-10-CM

## 2019-12-27 DIAGNOSIS — I10 Essential (primary) hypertension: Secondary | ICD-10-CM | POA: Insufficient documentation

## 2019-12-27 DIAGNOSIS — N202 Calculus of kidney with calculus of ureter: Secondary | ICD-10-CM | POA: Diagnosis not present

## 2019-12-27 DIAGNOSIS — N201 Calculus of ureter: Secondary | ICD-10-CM

## 2019-12-27 DIAGNOSIS — E119 Type 2 diabetes mellitus without complications: Secondary | ICD-10-CM | POA: Insufficient documentation

## 2019-12-27 DIAGNOSIS — Z7984 Long term (current) use of oral hypoglycemic drugs: Secondary | ICD-10-CM | POA: Diagnosis not present

## 2019-12-27 DIAGNOSIS — R69 Illness, unspecified: Secondary | ICD-10-CM | POA: Diagnosis not present

## 2019-12-27 DIAGNOSIS — Z79890 Hormone replacement therapy (postmenopausal): Secondary | ICD-10-CM | POA: Diagnosis not present

## 2019-12-27 DIAGNOSIS — Z79899 Other long term (current) drug therapy: Secondary | ICD-10-CM | POA: Diagnosis not present

## 2019-12-27 DIAGNOSIS — F419 Anxiety disorder, unspecified: Secondary | ICD-10-CM | POA: Diagnosis not present

## 2019-12-27 DIAGNOSIS — Z8673 Personal history of transient ischemic attack (TIA), and cerebral infarction without residual deficits: Secondary | ICD-10-CM | POA: Insufficient documentation

## 2019-12-27 DIAGNOSIS — E039 Hypothyroidism, unspecified: Secondary | ICD-10-CM | POA: Diagnosis not present

## 2019-12-27 DIAGNOSIS — Z7982 Long term (current) use of aspirin: Secondary | ICD-10-CM | POA: Insufficient documentation

## 2019-12-27 DIAGNOSIS — Z01818 Encounter for other preprocedural examination: Secondary | ICD-10-CM | POA: Diagnosis not present

## 2019-12-27 DIAGNOSIS — E785 Hyperlipidemia, unspecified: Secondary | ICD-10-CM | POA: Insufficient documentation

## 2019-12-27 DIAGNOSIS — Z87891 Personal history of nicotine dependence: Secondary | ICD-10-CM | POA: Insufficient documentation

## 2019-12-27 HISTORY — PX: EXTRACORPOREAL SHOCK WAVE LITHOTRIPSY: SHX1557

## 2019-12-27 LAB — GLUCOSE, CAPILLARY: Glucose-Capillary: 144 mg/dL — ABNORMAL HIGH (ref 70–99)

## 2019-12-27 SURGERY — LITHOTRIPSY, ESWL
Anesthesia: LOCAL | Laterality: Right

## 2019-12-27 MED ORDER — DIPHENHYDRAMINE HCL 25 MG PO CAPS
ORAL_CAPSULE | ORAL | Status: AC
  Filled 2019-12-27: qty 1

## 2019-12-27 MED ORDER — SODIUM CHLORIDE 0.9 % IV SOLN: INTRAVENOUS | Status: DC

## 2019-12-27 MED ORDER — ALFUZOSIN HCL ER 10 MG PO TB24: 10.0000 mg | ORAL_TABLET | Freq: Every day | ORAL | 0 refills | Status: DC

## 2019-12-27 MED ORDER — CIPROFLOXACIN HCL 500 MG PO TABS
500.0000 mg | ORAL_TABLET | ORAL | Status: AC
  Administered 2019-12-27: 500 mg via ORAL

## 2019-12-27 MED ORDER — DIAZEPAM 5 MG PO TABS
10.0000 mg | ORAL_TABLET | ORAL | Status: AC
  Administered 2019-12-27: 10 mg via ORAL

## 2019-12-27 MED ORDER — DIAZEPAM 5 MG PO TABS
ORAL_TABLET | ORAL | Status: AC
  Filled 2019-12-27: qty 2

## 2019-12-27 MED ORDER — OXYCODONE-ACETAMINOPHEN 5-325 MG PO TABS: 1.0000 | ORAL_TABLET | ORAL | 0 refills | Status: DC | PRN

## 2019-12-27 MED ORDER — DIPHENHYDRAMINE HCL 25 MG PO CAPS
25.0000 mg | ORAL_CAPSULE | ORAL | Status: AC
  Administered 2019-12-27: 25 mg via ORAL

## 2019-12-27 MED ORDER — CIPROFLOXACIN HCL 500 MG PO TABS
ORAL_TABLET | ORAL | Status: AC
  Filled 2019-12-27: qty 1

## 2019-12-27 NOTE — Discharge Instructions (Signed)
Post Anesthesia Home Care Instructions  Activity: Get plenty of rest for the remainder of the day. A responsible adult should stay with you for 24 hours following the procedure.  For the next 24 hours, DO NOT: -Drive a car -Operate machinery -Drink alcoholic beverages -Take any medication unless instructed by your physician -Make any legal decisions or sign important papers.  Meals: Start with liquid foods such as gelatin or soup. Progress to regular foods as tolerated. Avoid greasy, spicy, heavy foods. If nausea and/or vomiting occur, drink only clear liquids until the nausea and/or vomiting subsides. Call your physician if vomiting continues.  Special Instructions/Symptoms: Your throat may feel dry or sore from the anesthesia or the breathing tube placed in your throat during surgery. If this causes discomfort, gargle with warm salt water. The discomfort should disappear within 24 hours.  If you had a scopolamine patch placed behind your ear for the management of post- operative nausea and/or vomiting:  1. The medication in the patch is effective for 72 hours, after which it should be removed.  Wrap patch in a tissue and discard in the trash. Wash hands thoroughly with soap and water. 2. You may remove the patch earlier than 72 hours if you experience unpleasant side effects which may include dry mouth, dizziness or visual disturbances. 3. Avoid touching the patch. Wash your hands with soap and water after contact with the patch.   Lithotripsy, Care After This sheet gives you information about how to care for yourself after your procedure. Your health care provider may also give you more specific instructions. If you have problems or questions, contact your health care provider. What can I expect after the procedure? After the procedure, it is common to have:  Some blood in your urine. This should only last for a few days.  Soreness in your back, sides, or upper abdomen for a few  days.  Blotches or bruises on your back where the pressure wave entered the skin.  Pain, discomfort, or nausea when pieces (fragments) of the kidney stone move through the tube that carries urine from the kidney to the bladder (ureter). Stone fragments may pass soon after the procedure, but they may continue to pass for up to 4-8 weeks. ? If you have severe pain or nausea, contact your health care provider. This may be caused by a large stone that was not broken up, and this may mean that you need more treatment.  Some pain or discomfort during urination.  Some pain or discomfort in the lower abdomen or (in men) at the base of the penis. Follow these instructions at home: Medicines  Take over-the-counter and prescription medicines only as told by your health care provider.  If you were prescribed an antibiotic medicine, take it as told by your health care provider. Do not stop taking the antibiotic even if you start to feel better.  Do not drive for 24 hours if you were given a medicine to help you relax (sedative).  Do not drive or use heavy machinery while taking prescription pain medicine. Eating and drinking      Drink enough water and fluids to keep your urine clear or pale yellow. This helps any remaining pieces of the stone to pass. It can also help prevent new stones from forming.  Eat plenty of fresh fruits and vegetables.  Follow instructions from your health care provider about eating and drinking restrictions. You may be instructed: ? To reduce how much salt (sodium) you eat   or drink. Check ingredients and nutrition facts on packaged foods and beverages. ? To reduce how much meat you eat.  Eat the recommended amount of calcium for your age and gender. Ask your health care provider how much calcium you should have. General instructions  Get plenty of rest.  Most people can resume normal activities 1-2 days after the procedure. Ask your health care provider what  activities are safe for you.  Your health care provider may direct you to lie in a certain position (postural drainage) and tap firmly (percuss) over your kidney area to help stone fragments pass. Follow instructions as told by your health care provider.  If directed, strain all urine through the strainer that was provided by your health care provider. ? Keep all fragments for your health care provider to see. Any stones that are found may be sent to a medical lab for examination. The stone may be as small as a grain of salt.  Keep all follow-up visits as told by your health care provider. This is important. Contact a health care provider if:  You have pain that is severe or does not get better with medicine.  You have nausea that is severe or does not go away.  You have blood in your urine longer than your health care provider told you to expect.  You have more blood in your urine.  You have pain during urination that does not go away.  You urinate more frequently than usual and this does not go away.  You develop a rash or any other possible signs of an allergic reaction. Get help right away if:  You have severe pain in your back, sides, or upper abdomen.  You have severe pain while urinating.  Your urine is very dark red.  You have blood in your stool (feces).  You cannot pass any urine at all.  You feel a strong urge to urinate after emptying your bladder.  You have a fever or chills.  You develop shortness of breath, difficulty breathing, or chest pain.  You have severe nausea that leads to persistent vomiting.  You faint. Summary  After this procedure, it is common to have some pain, discomfort, or nausea when pieces (fragments) of the kidney stone move through the tube that carries urine from the kidney to the bladder (ureter). If this pain or nausea is severe, however, you should contact your health care provider.  Most people can resume normal activities 1-2  days after the procedure. Ask your health care provider what activities are safe for you.  Drink enough water and fluids to keep your urine clear or pale yellow. This helps any remaining pieces of the stone to pass, and it can help prevent new stones from forming.  If directed, strain your urine and keep all fragments for your health care provider to see. Fragments or stones may be as small as a grain of salt.  Get help right away if you have severe pain in your back, sides, or upper abdomen or have severe pain while urinating. This information is not intended to replace advice given to you by your health care provider. Make sure you discuss any questions you have with your health care provider. Document Revised: 11/06/2018 Document Reviewed: 06/16/2016 Elsevier Patient Education  2020 [employer]  

## 2019-12-31 ENCOUNTER — Other Ambulatory Visit: Payer: Self-pay | Admitting: Cardiology

## 2019-12-31 ENCOUNTER — Other Ambulatory Visit: Payer: Self-pay | Admitting: Adult Health

## 2020-01-01 ENCOUNTER — Other Ambulatory Visit: Payer: Self-pay | Admitting: Cardiology

## 2020-01-10 DIAGNOSIS — N201 Calculus of ureter: Secondary | ICD-10-CM | POA: Diagnosis not present

## 2020-01-14 ENCOUNTER — Encounter: Payer: Self-pay | Admitting: Adult Health

## 2020-01-14 NOTE — Telephone Encounter (Signed)
Can you please pull CPAP report on this patient?  Thank you.

## 2020-01-15 NOTE — Telephone Encounter (Signed)
Are you able to assist with this? He has been having a difficult time getting scheduled with [facility] for mask refitting and leak rate.

## 2020-01-16 DIAGNOSIS — G4733 Obstructive sleep apnea (adult) (pediatric): Secondary | ICD-10-CM | POA: Diagnosis not present

## 2020-01-17 DIAGNOSIS — N201 Calculus of ureter: Secondary | ICD-10-CM | POA: Diagnosis not present

## 2020-01-19 DIAGNOSIS — G4733 Obstructive sleep apnea (adult) (pediatric): Secondary | ICD-10-CM | POA: Diagnosis not present

## 2020-01-22 NOTE — Telephone Encounter (Signed)
Received this notice from [employer]: "The pt filed this complaint on 06/08. He spoke w/ Broadus John on 06/10 and is already scheduled for a mask fitting on 06/17.   I'll call him just in case, but FYI."

## 2020-01-22 NOTE — Telephone Encounter (Signed)
I have reached out to Therapist, nutritional for assistance.

## 2020-02-12 DIAGNOSIS — N201 Calculus of ureter: Secondary | ICD-10-CM | POA: Diagnosis not present

## 2020-02-15 DIAGNOSIS — G4733 Obstructive sleep apnea (adult) (pediatric): Secondary | ICD-10-CM | POA: Diagnosis not present

## 2020-02-18 DIAGNOSIS — G4733 Obstructive sleep apnea (adult) (pediatric): Secondary | ICD-10-CM | POA: Diagnosis not present

## 2020-02-21 ENCOUNTER — Encounter: Payer: Self-pay | Admitting: Cardiology

## 2020-02-21 ENCOUNTER — Ambulatory Visit: Payer: Medicare HMO | Admitting: Cardiology

## 2020-02-21 ENCOUNTER — Other Ambulatory Visit: Payer: Self-pay

## 2020-02-21 VITALS — BP 131/67 | HR 66 | Ht 68.0 in | Wt 213.0 lb

## 2020-02-21 DIAGNOSIS — G4733 Obstructive sleep apnea (adult) (pediatric): Secondary | ICD-10-CM | POA: Diagnosis not present

## 2020-02-21 DIAGNOSIS — I491 Atrial premature depolarization: Secondary | ICD-10-CM

## 2020-02-21 DIAGNOSIS — I4729 Other ventricular tachycardia: Secondary | ICD-10-CM

## 2020-02-21 DIAGNOSIS — G459 Transient cerebral ischemic attack, unspecified: Secondary | ICD-10-CM | POA: Diagnosis not present

## 2020-02-21 DIAGNOSIS — I472 Ventricular tachycardia: Secondary | ICD-10-CM

## 2020-02-21 DIAGNOSIS — Z87891 Personal history of nicotine dependence: Secondary | ICD-10-CM

## 2020-02-21 DIAGNOSIS — R7303 Prediabetes: Secondary | ICD-10-CM | POA: Diagnosis not present

## 2020-02-21 DIAGNOSIS — E7800 Pure hypercholesterolemia, unspecified: Secondary | ICD-10-CM

## 2020-02-21 DIAGNOSIS — I10 Essential (primary) hypertension: Secondary | ICD-10-CM

## 2020-02-21 DIAGNOSIS — Z9989 Dependence on other enabling machines and devices: Secondary | ICD-10-CM

## 2020-02-21 NOTE — Progress Notes (Signed)
Norman Hudson. Date of Birth: [birth year] MRN: [id number] Primary Care Provider:Timberlake, Meridee Score, MD Former Cardiology Providers: Altamese Tynan, APRN, FNP-C Primary Cardiologist: Tessa Lerner, DO, [facility] (established care 02/21/2020)  Date: 02/21/20 Last Visit: 08/24/2019  Chief Complaint  Patient presents with  . Hypertension    6 month f/u  . PAc's  . Follow-up    HPI  Norman Hudson. is a 69 y.o.  male who presents to the office with a chief complaint of " 34-month follow-up on hypertension and PACs." Patient's past medical history and cardiovascular risk factors include: prediabetes, hypertension, hyperlipidemia, chronically low testosterone, hypothyroidism, former smoker with 15 pack year history, anxiety, osteoarthritis, history of TIA, advanced age.  Patient was formally under the care of Phineas Semen and now will be transitioning his care to me for the management of hypertension and PACs.  It appears that patient had an echocardiogram back in July 2020 and noted to have preserved left ventricular systolic function.  He underwent event monitor which noted episodes of occasional PACs and PVCs and 2 episodes of NSVT per report.  The episodes of NSVT's were asymptomatic.  However in the setting of nonsustained ventricular tachycardia patient underwent stress test which was reported to be low risk.  Unfortunately, in November 2020 he had a TIA with symptoms of aphasia.  By the time EMS arrived his symptoms of aphasia had resolved.  Patient was started on aspirin and Plavix.  Patient states that now he is only on Plavix 75 mg p.o. daily.   At the last office visit patient was recommended to reduce his Aldactone to 25 mg p.o. daily due to hypotension.  Patient has tolerated the medication change well.  Since last visit patient has been stable from a cardiovascular standpoint.  He denies any chest pain or shortness of breath at rest or with effort related activities.     At times patient states that when he has [place] food he notices lower extremity swelling more than other days.  He is wondering if he should uptitrate his diuretics.   History of transient ischemic attack. Denies prior history of coronary artery disease, myocardial infarction, congestive heart failure, deep venous thrombosis, pulmonary embolism, stroke.  FUNCTIONAL STATUS: No structured exercise program or daily routine.    ALLERGIES: Allergies  Allergen Reactions  . Bactrim [Sulfamethoxazole-Trimethoprim]     "threw me for a loop, hives, itching, extreme fatigue"  . Tramadol Itching and Rash    MEDICATION LIST PRIOR TO VISIT: Current Outpatient Medications on File Prior to Visit  Medication Sig Dispense Refill  . ALPRAZolam (XANAX) 0.5 MG tablet Take 0.5 mg by mouth at bedtime as needed for anxiety.    Marland Kitchen amLODipine (NORVASC) 5 MG tablet Take 5 mg by mouth 2 (two) times daily.    . carvedilol (COREG) 12.5 MG tablet Take 1 tablet (12.5 mg total) by mouth 2 (two) times daily with a meal. 180 tablet 3  . cholecalciferol (VITAMIN D3) 25 MCG (1000 UT) tablet Take 1,000 Units by mouth daily.    . clopidogrel (PLAVIX) 75 MG tablet TAKE 1 TABLET ONCE DAILY. 90 tablet 0  . hydrochlorothiazide (HYDRODIURIL) 25 MG tablet Take 25 mg by mouth daily.    Marland Kitchen levothyroxine (SYNTHROID, LEVOTHROID) 100 MCG tablet Take 100 mcg by mouth daily before breakfast.    . metFORMIN (GLUCOPHAGE) 500 MG tablet Take 500 mg by mouth daily with breakfast.    . Multiple Vitamins-Minerals (MULTIVITAMIN WITH MINERALS) tablet Take 1 tablet by  mouth daily.    Marland Kitchen olmesartan (BENICAR) 40 MG tablet Take 40 mg by mouth daily.    . Pyridoxine HCl (VITAMIN B-6 PO) Take 100 mg by mouth daily.    . rosuvastatin (CRESTOR) 20 MG tablet TAKE ONE TABLET AT BEDTIME. 90 tablet 1  . spironolactone (ALDACTONE) 25 MG tablet TAKE 1 TABLET ONCE DAILY. 90 tablet 0  . valACYclovir HCl (VALTREX PO) Take 1,000 mg by mouth daily as needed (for  herpes flare).     . vitamin B-12 (CYANOCOBALAMIN) 500 MCG tablet Take 500 mcg by mouth daily.    Marland Kitchen zinc gluconate 50 MG tablet Take 50 mg by mouth daily.     No current facility-administered medications on file prior to visit.    PAST MEDICAL HISTORY: Past Medical History:  Diagnosis Date  . Acne   . Anxiety    takes Xanax daily as needed  . Arthritis   . Diabetes mellitus without complication (HCC)    takes Metformin daily-per pt prediabetes  . History of kidney stones   . Hyperlipidemia    takes Pravastatin daily  . Hypertension    takes Amlodipine and Valsartan-HCTZ daily  . Hypothyroidism    takes Synthroid daily  . Inguinal hernia    left side  . Internal hemorrhoids   . Joint pain   . Kidney stones   . Rosacea   . TIA (transient ischemic attack) 06/2019    PAST SURGICAL HISTORY: Past Surgical History:  Procedure Laterality Date  . COLONOSCOPY    . EXTRACORPOREAL SHOCK WAVE LITHOTRIPSY Right 12/27/2019   Procedure: EXTRACORPOREAL SHOCK WAVE LITHOTRIPSY (ESWL);  Surgeon: Malen Gauze, MD;  Location: [facility];  Service: Urology;  Laterality: Right;  . LITHOTRIPSY  09/2015  . LUMBAR LAMINECTOMY WITH COFLEX 1 LEVEL N/A 07/07/2015   Procedure: LUMBAR FOUR-FIVE LUMBAR LAMINECTOMY WITH COFLEX;  Surgeon: Tia Alert, MD;  Location: [facility];  Service: Neurosurgery;  Laterality: N/A;  . TONSILLECTOMY    . 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   Procedure: TOTAL HIP ARTHROPLASTY;  Surgeon: Frederico Hamman, MD;  Location: [facility];  Service: Orthopedics;  Laterality: Left;    FAMILY HISTORY: The patient's family history includes Alcoholism in his mother; Aneurysm in his sister; Atrial fibrillation in his sister; CVA in his father; Cancer in his maternal grandfather; Diabetes in his paternal grandmother; Hypertension in his father, paternal grandmother, and sister; Sleep disorder in his father.   SOCIAL HISTORY:  The patient  reports that he quit  smoking about 31 years ago. His smoking use included cigarettes. He has a 15.00 pack-year smoking history. He has never used smokeless tobacco. He reports that he does not drink alcohol and does not use drugs.  Review of Systems  Constitutional: Negative for chills and fever.  HENT: Negative for hoarse voice and nosebleeds.   Eyes: Negative for discharge, double vision and pain.  Cardiovascular: Positive for leg swelling (with high salt intake. ). Negative for chest pain, claudication, dyspnea on exertion, near-syncope, orthopnea, palpitations, paroxysmal nocturnal dyspnea and syncope.  Respiratory: Negative for hemoptysis and shortness of breath.   Musculoskeletal: Negative for muscle cramps and myalgias.  Gastrointestinal: Negative for abdominal pain, constipation, diarrhea, hematemesis, hematochezia, melena, nausea and vomiting.  Neurological: Negative for dizziness and light-headedness.    PHYSICAL EXAM: Vitals with BMI 02/21/2020 12/27/2019 12/27/2019  Height 5\' 8"  - 5\' 9"   Weight 213 lbs - 209 lbs  BMI 32.39 - 30.85  Systolic 131 123 161  Diastolic 67 73 68  Pulse 66 55 57    CONSTITUTIONAL: Well-developed and well-nourished. No acute distress.  SKIN: Skin is warm and dry. No rash noted. No cyanosis. No pallor. No jaundice HEAD: Normocephalic and atraumatic.  EYES: No scleral icterus MOUTH/THROAT: Moist oral membranes.  NECK: No JVD present. No thyromegaly noted. No carotid bruits  LYMPHATIC: No visible cervical adenopathy.  CHEST Normal respiratory effort. No intercostal retractions  LUNGS: Clear to auscultation bilaterally.  No stridor. No wheezes. No rales.  CARDIOVASCULAR: Regular rate and rhythm, positive S1-S2, no murmurs rubs or gallops appreciated. ABDOMINAL: Soft, nontender, nondistended, positive bowel sounds in all 4 quadrants.  No apparent ascites.  EXTREMITIES: No peripheral edema. Pulses:      Femoral pulses are 2+ on the right side and 2+ on the left side.       Popliteal pulses are 1+ on the right side and 1+ on the left side.       Dorsalis pedis pulses are 1+ on the right side and 1+ on the left side.       Posterior tibial pulses are 1+ on the right side and 1+ on the left side.  HEMATOLOGIC: No significant bruising NEUROLOGIC: Oriented to person, place, and time. Nonfocal. Normal muscle tone.  PSYCHIATRIC: Normal mood and affect. Normal behavior. Cooperative  CARDIAC DATABASE: EKG: 02/21/2020: Normal sinus rhythm, 63 bpm, normal axis, without underlying ischemia or injury pattern, occasional PACs.  Echocardiogram: 02/27/2019: LVEF 55%, normal wall thickness, normal global function, indeterminate diastolic filling pattern, mild TR, RVSP 26 mmHg.  Stress Testing:  Exercise myoview stress test 04/23/2019: Patient exercised for a total of 6:13 min., achieving approximately 7 METs. Normal BP response. Exercise was terminated due to fatigue/weakness and achieving THR. Normal myocardial perfusion. All segments of left ventricle demonstrated normal wall motion and thickening. Stress LV EF is hyperdynamic 75%. Low risk study.   Event Monitor for 30 days Start date 07/11/2019: There were no symptomatic triggered events. Predominant rhythm is normal sinus rhythm. Rare PACs and PVCs. No atrial fibrillation.  21 day event monitor 07/07-07/27/2020: NSR with occasional PAC/PVC. 1 patient triggered event without reported symptoms correlated with NSR with PAC and PVC. 2 auto detected events occurred for NSR with 5 beats of NSVT a t 173 bpm on day 1 at 1453 and NSR with 6 beats of NSVT on day 12 at 1651. Both were asymptomatic. 1 auto detected event on day 14 for Sinus tachycardia with frequent PAC's. No A fib was noted.  LABORATORY DATA: CBC Latest Ref Rng & Units 07/08/2019 07/08/2019 07/25/2016  WBC 4.0 - 10.5 K/uL - 7.3 12.1(H)  Hemoglobin 13.0 - 17.0 g/dL 82.9 56.2 13.0  Hematocrit 39 - 52 % 44.0 42.9 39.4  Platelets 150 - 400 K/uL - 174 139(L)     CMP Latest Ref Rng & Units 07/08/2019 07/08/2019 04/19/2019  Glucose 70 - 99 mg/dL 865(H) 846(N) 629(B)  BUN 8 - 23 mg/dL 21 19 18   Creatinine 0.61 - 1.24 mg/dL 2.84 1.32 4.40  Sodium 135 - 145 mmol/L 137 137 137  Potassium 3.5 - 5.1 mmol/L 4.5 4.4 4.3  Chloride 98 - 111 mmol/L 102 102 100  CO2 22 - 32 mmol/L - 25 24  Calcium 8.9 - 10.3 mg/dL - 11.1(H) 10.0  Total Protein 6.5 - 8.1 g/dL - 7.6 -  Total Bilirubin 0.3 - 1.2 mg/dL - 1.3(H) -  Alkaline Phos 38 - 126 U/L - 53 -  AST 15 - 41 U/L -  26 -  ALT 0 - 44 U/L - 27 -    Lipid Panel     Component Value Date/Time   CHOL 139 07/12/2019 1026   TRIG 115 07/12/2019 1026   HDL 46 07/12/2019 1026   CHOLHDL 3.0 07/12/2019 1026   LDLCALC 72 07/12/2019 1026   LABVLDL 21 07/12/2019 1026    Lab Results  Component Value Date   HGBA1C 5.8 (H) 07/12/2016   HGBA1C 6.1 (H) 06/27/2015   No components found for: NTPROBNP No results found for: TSH  Cardiac Panel (last 3 results) No results for input(s): CKTOTAL, CKMB, TROPONINIHS, RELINDX in the last 72 hours.  IMPRESSION:    ICD-10-CM   1. PAC (premature atrial contraction)  I49.1 EKG 12-Lead  2. Essential hypertension  I10   3. TIA (transient ischemic attack)  G45.9   4. Pure hypercholesterolemia  E78.00   5. NSVT (nonsustained ventricular tachycardia) (HCC)  I47.2   6. OSA on CPAP  G47.33    Z99.89   7. Former smoker  Z87.891   8. Prediabetes  R73.03      RECOMMENDATIONS: Norman Hudson. is a 69 y.o. male whose past medical history and cardiovascular risk factors include: prediabetes, hypertension, hyperlipidemia, chronically low testosterone, hypothyroidism, former smoker with 15 pack year history, anxiety, osteoarthritis, history of TIA, advanced age.  Premature atrial contractions: Asymptomatic.  Continue carvedilol 12.5 mg p.o. twice daily.  Most recent echocardiogram noted preserved LVEF.  Continue to monitor  Benign essential  hypertension:  Patient systolic blood pressures have improved since last office visit.  Patient states that his systolic blood pressures are usually around 130 mmHg.  Continue current antihypertensive medications.  In regards to his symptoms of lower extremity swelling.  Encouraged him to consume a low-salt diet as meals high in salt will lead to more lower extremity swelling.  Patient also was informed that his lower extremity swelling may be secondary to amlodipine.  Patient states that he is comfortable taking amlodipine and does not want to change at this time and his lower extremity swelling is not bothersome.  History of TIA:  Continue Plavix and statin therapy.  Secondary prevention as per the care of neurology and primary team.  History of asymptomatic NSVT: Patient has had a thorough work-up including an echo and stress test.  He is on beta-blocker therapy.  Asymptomatic.  Continue to monitor.  Former smoker: Educated on the importance of continued smoking cessation.  FINAL MEDICATION LIST END OF ENCOUNTER: No orders of the defined types were placed in this encounter.   Current Outpatient Medications:  .  ALPRAZolam (XANAX) 0.5 MG tablet, Take 0.5 mg by mouth at bedtime as needed for anxiety., Disp: , Rfl:  .  amLODipine (NORVASC) 5 MG tablet, Take 5 mg by mouth 2 (two) times daily., Disp: , Rfl:  .  carvedilol (COREG) 12.5 MG tablet, Take 1 tablet (12.5 mg total) by mouth 2 (two) times daily with a meal., Disp: 180 tablet, Rfl: 3 .  cholecalciferol (VITAMIN D3) 25 MCG (1000 UT) tablet, Take 1,000 Units by mouth daily., Disp: , Rfl:  .  clopidogrel (PLAVIX) 75 MG tablet, TAKE 1 TABLET ONCE DAILY., Disp: 90 tablet, Rfl: 0 .  hydrochlorothiazide (HYDRODIURIL) 25 MG tablet, Take 25 mg by mouth daily., Disp: , Rfl:  .  levothyroxine (SYNTHROID, LEVOTHROID) 100 MCG tablet, Take 100 mcg by mouth daily before breakfast., Disp: , Rfl:  .  metFORMIN (GLUCOPHAGE) 500 MG tablet, Take  500 mg by  mouth daily with breakfast., Disp: , Rfl:  .  Multiple Vitamins-Minerals (MULTIVITAMIN WITH MINERALS) tablet, Take 1 tablet by mouth daily., Disp: , Rfl:  .  olmesartan (BENICAR) 40 MG tablet, Take 40 mg by mouth daily., Disp: , Rfl:  .  Pyridoxine HCl (VITAMIN B-6 PO), Take 100 mg by mouth daily., Disp: , Rfl:  .  rosuvastatin (CRESTOR) 20 MG tablet, TAKE ONE TABLET AT BEDTIME., Disp: 90 tablet, Rfl: 1 .  spironolactone (ALDACTONE) 25 MG tablet, TAKE 1 TABLET ONCE DAILY., Disp: 90 tablet, Rfl: 0 .  valACYclovir HCl (VALTREX PO), Take 1,000 mg by mouth daily as needed (for herpes flare). , Disp: , Rfl:  .  vitamin B-12 (CYANOCOBALAMIN) 500 MCG tablet, Take 500 mcg by mouth daily., Disp: , Rfl:  .  zinc gluconate 50 MG tablet, Take 50 mg by mouth daily., Disp: , Rfl:   Orders Placed This Encounter  Procedures  . EKG 12-Lead   --Continue cardiac medications as reconciled in final medication list. --Return in about 6 months (around 08/23/2020) for re-evaluation of symptoms (PACs and LE swelling). . Or sooner if needed. --Continue follow-up with your primary care physician regarding the management of your other chronic comorbid conditions.  Patient's questions and concerns were addressed to his satisfaction. He voices understanding of the instructions provided during this encounter.   This note was created using a voice recognition software as a result there may be grammatical errors inadvertently enclosed that do not reflect the nature of this encounter. Every attempt is made to correct such errors.  Tessa Lerner, [place], [facility]  Pager: [phone] Office: [phone]

## 2020-03-12 ENCOUNTER — Encounter: Payer: Self-pay | Admitting: Neurology

## 2020-03-12 DIAGNOSIS — I499 Cardiac arrhythmia, unspecified: Secondary | ICD-10-CM | POA: Diagnosis not present

## 2020-03-12 DIAGNOSIS — R7303 Prediabetes: Secondary | ICD-10-CM | POA: Diagnosis not present

## 2020-03-12 DIAGNOSIS — I10 Essential (primary) hypertension: Secondary | ICD-10-CM | POA: Diagnosis not present

## 2020-03-12 DIAGNOSIS — E7800 Pure hypercholesterolemia, unspecified: Secondary | ICD-10-CM | POA: Diagnosis not present

## 2020-03-12 DIAGNOSIS — E039 Hypothyroidism, unspecified: Secondary | ICD-10-CM | POA: Diagnosis not present

## 2020-03-17 ENCOUNTER — Ambulatory Visit: Payer: Medicare HMO | Admitting: Neurology

## 2020-03-17 ENCOUNTER — Encounter: Payer: Self-pay | Admitting: Neurology

## 2020-03-17 VITALS — BP 119/72 | HR 52 | Ht 69.0 in | Wt 214.0 lb

## 2020-03-17 DIAGNOSIS — Z9989 Dependence on other enabling machines and devices: Secondary | ICD-10-CM | POA: Diagnosis not present

## 2020-03-17 DIAGNOSIS — G4733 Obstructive sleep apnea (adult) (pediatric): Secondary | ICD-10-CM | POA: Diagnosis not present

## 2020-03-17 DIAGNOSIS — G4752 REM sleep behavior disorder: Secondary | ICD-10-CM

## 2020-03-17 MED ORDER — CLONAZEPAM 0.5 MG PO TABS: 0.5000 mg | ORAL_TABLET | Freq: Every evening | ORAL | 0 refills | Status: DC | PRN
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
## 2020-03-17 NOTE — Patient Instructions (Signed)

## 2020-03-18 DIAGNOSIS — G4733 Obstructive sleep apnea (adult) (pediatric): Secondary | ICD-10-CM | POA: Diagnosis not present

## 2020-03-19 DIAGNOSIS — R69 Illness, unspecified: Secondary | ICD-10-CM | POA: Diagnosis not present

## 2020-03-25 ENCOUNTER — Other Ambulatory Visit: Payer: Self-pay | Admitting: Cardiology

## 2020-03-25 DIAGNOSIS — Z9889 Other specified postprocedural states: Secondary | ICD-10-CM

## 2020-03-28 ENCOUNTER — Other Ambulatory Visit: Payer: Self-pay | Admitting: Cardiology

## 2020-04-01 ENCOUNTER — Other Ambulatory Visit: Payer: Self-pay | Admitting: Cardiology

## 2020-04-17 DIAGNOSIS — G4733 Obstructive sleep apnea (adult) (pediatric): Secondary | ICD-10-CM | POA: Diagnosis not present

## 2020-04-18 DIAGNOSIS — G4733 Obstructive sleep apnea (adult) (pediatric): Secondary | ICD-10-CM | POA: Diagnosis not present

## 2020-04-28 ENCOUNTER — Other Ambulatory Visit: Payer: Self-pay | Admitting: Cardiology

## 2020-05-06 DIAGNOSIS — R69 Illness, unspecified: Secondary | ICD-10-CM | POA: Diagnosis not present

## 2020-05-18 DIAGNOSIS — G4733 Obstructive sleep apnea (adult) (pediatric): Secondary | ICD-10-CM | POA: Diagnosis not present

## 2020-06-17 DIAGNOSIS — G4733 Obstructive sleep apnea (adult) (pediatric): Secondary | ICD-10-CM | POA: Diagnosis not present

## 2020-06-24 ENCOUNTER — Other Ambulatory Visit: Payer: Self-pay | Admitting: Cardiology

## 2020-06-24 DIAGNOSIS — Z9889 Other specified postprocedural states: Secondary | ICD-10-CM

## 2020-06-25 ENCOUNTER — Encounter: Payer: Self-pay | Admitting: Neurology

## 2020-06-25 NOTE — Telephone Encounter (Signed)
In regards to your question about INSPIRE , an implanted cranial nerve ( hypoglossus) stimulator:  Dear Norman Hudson, You had mild and dominantly obstructive apnea (OSA), not  REM sleep dependent.  Your oxygen dropped once to 64% but desaturation was not for prolonged time.  You have most apneas in supine sleep position. Your BMI is just about 32-  you could qualify for INSPIRE.   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   CD  Cc Dr Einar Gip,

## 2020-06-30 ENCOUNTER — Other Ambulatory Visit: Payer: Self-pay | Admitting: Cardiology

## 2020-07-17 DIAGNOSIS — G4733 Obstructive sleep apnea (adult) (pediatric): Secondary | ICD-10-CM | POA: Diagnosis not present

## 2020-07-27 ENCOUNTER — Other Ambulatory Visit: Payer: Self-pay | Admitting: Cardiology

## 2020-08-11 DIAGNOSIS — Z87442 Personal history of urinary calculi: Secondary | ICD-10-CM | POA: Diagnosis not present

## 2020-08-11 DIAGNOSIS — E291 Testicular hypofunction: Secondary | ICD-10-CM | POA: Diagnosis not present

## 2020-08-13 DIAGNOSIS — R059 Cough, unspecified: Secondary | ICD-10-CM | POA: Diagnosis not present

## 2020-08-25 ENCOUNTER — Ambulatory Visit: Payer: Medicare HMO | Admitting: Cardiology

## 2020-08-28 ENCOUNTER — Encounter: Payer: Self-pay | Admitting: Cardiology

## 2020-08-28 ENCOUNTER — Ambulatory Visit: Payer: Medicare HMO | Admitting: Cardiology

## 2020-08-28 VITALS — BP 127/76 | HR 67 | Temp 98.2°F | Resp 16 | Ht 69.0 in | Wt 216.0 lb

## 2020-08-28 DIAGNOSIS — I10 Essential (primary) hypertension: Secondary | ICD-10-CM

## 2020-08-28 DIAGNOSIS — I472 Ventricular tachycardia: Secondary | ICD-10-CM | POA: Diagnosis not present

## 2020-08-28 DIAGNOSIS — I491 Atrial premature depolarization: Secondary | ICD-10-CM | POA: Diagnosis not present

## 2020-08-28 DIAGNOSIS — E7800 Pure hypercholesterolemia, unspecified: Secondary | ICD-10-CM | POA: Diagnosis not present

## 2020-08-28 DIAGNOSIS — Z87891 Personal history of nicotine dependence: Secondary | ICD-10-CM

## 2020-08-28 DIAGNOSIS — G4733 Obstructive sleep apnea (adult) (pediatric): Secondary | ICD-10-CM

## 2020-08-28 DIAGNOSIS — I4729 Other ventricular tachycardia: Secondary | ICD-10-CM

## 2020-08-28 DIAGNOSIS — G459 Transient cerebral ischemic attack, unspecified: Secondary | ICD-10-CM

## 2020-08-28 DIAGNOSIS — Z9989 Dependence on other enabling machines and devices: Secondary | ICD-10-CM | POA: Diagnosis not present

## 2020-08-28 NOTE — Progress Notes (Signed)
Norman Hudson Norman Hudson. Date of Birth: 1951/01/15 MRN: [id number] Primary Care Provider:Timberlake, Meridee Score, MD Former Cardiology Providers: Altamese Lyndon, APRN, FNP-C Primary Cardiologist: Tessa Lerner, DO, [facility] (established care 02/21/2020)  Date: 08/28/20 Last Office Visit: 02/21/2020  Chief Complaint  Patient presents with  . PAC   . Leg Swelling  . Follow-up    6 month    HPI  Norman Wimp. is a 70 y.o.  male who presents to the office with a chief complaint of " 58-month follow-up on hypertension and PACs." Patient's past medical history and cardiovascular risk factors include: prediabetes, hypertension, hyperlipidemia, chronically low testosterone, hypothyroidism, former smoker with 15 pack year history, anxiety, osteoarthritis, history of TIA, advanced age.  Patient is being followed longitudinally by our practice for the management of hypertension and PACs.  It appears that patient had an echocardiogram back in July 2020 and noted to have preserved left ventricular systolic function.  He underwent event monitor which noted episodes of occasional PACs and PVCs and 2 episodes of NSVT per report.  The episodes of NSVT's were asymptomatic.  However in the setting of nonsustained ventricular tachycardia patient underwent stress test which was reported to be low risk.  Unfortunately, in November 2020 he had a TIA with symptoms of aphasia.  By the time EMS arrived his symptoms of aphasia had resolved.  Patient was started on aspirin and Plavix.  Patient states that now he is only on Plavix 75 mg p.o. daily.   Since last office visit patient states that he is doing well from a cardiovascular standpoint.  No hospitalizations or urgent care visits for cardiovascular symptoms.  Patient has reduced his salt intake significantly and his blood pressure has improved as well.  He is also increased his compliance with his CPAP which is significantly reduced his apnea-hypopnea  index.  FUNCTIONAL STATUS: No structured exercise program or daily routine.    ALLERGIES: Allergies  Allergen Reactions  . Bactrim [Sulfamethoxazole-Trimethoprim]     "threw me for a loop, hives, itching, extreme fatigue"  . Tramadol Itching and Rash    MEDICATION LIST PRIOR TO VISIT: Current Outpatient Medications on File Prior to Visit  Medication Sig Dispense Refill  . ALPRAZolam (XANAX) 0.5 MG tablet Take 0.5 mg by mouth at bedtime as needed for anxiety.    Marland Kitchen amLODipine (NORVASC) 5 MG tablet Take 5 mg by mouth 2 (two) times daily.    . carvedilol (COREG) 12.5 MG tablet TAKE  (1)  TABLET TWICE A DAY WITH MEALS (BREAKFAST AND SUPPER) 180 tablet 0  . cholecalciferol (VITAMIN D3) 25 MCG (1000 UT) tablet Take 1,000 Units by mouth daily.    . clonazePAM (KLONOPIN) 0.5 MG tablet Take 1 tablet (0.5 mg total) by mouth at bedtime as needed for anxiety. 90 tablet 0  . clopidogrel (PLAVIX) 75 MG tablet TAKE 1 TABLET ONCE DAILY. 90 tablet 0  . hydrochlorothiazide (HYDRODIURIL) 25 MG tablet Take 25 mg by mouth daily.    Marland Kitchen levothyroxine (SYNTHROID, LEVOTHROID) 100 MCG tablet Take 100 mcg by mouth daily before breakfast.    . metFORMIN (GLUCOPHAGE) 500 MG tablet Take 500 mg by mouth daily with breakfast.    . Multiple Vitamins-Minerals (MULTIVITAMIN WITH MINERALS) tablet Take 1 tablet by mouth daily.    Marland Kitchen olmesartan (BENICAR) 40 MG tablet Take 40 mg by mouth daily.    . rosuvastatin (CRESTOR) 20 MG tablet TAKE ONE TABLET AT BEDTIME. 90 tablet 0  . spironolactone (ALDACTONE) 25 MG tablet  TAKE 1 TABLET ONCE DAILY. 90 tablet 3  . valACYclovir HCl (VALTREX PO) Take 1,000 mg by mouth daily as needed (for herpes flare).     . vitamin B-12 (CYANOCOBALAMIN) 500 MCG tablet Take 500 mcg by mouth daily.    Marland Kitchen zinc gluconate 50 MG tablet Take 50 mg by mouth daily.    Marland Kitchen pyridOXINE (VITAMIN B-6) 100 MG tablet Take 1 tablet by mouth daily.     No current facility-administered medications on file prior to visit.     PAST MEDICAL HISTORY: Past Medical History:  Diagnosis Date  . Acne   . Anxiety    takes Xanax daily as needed  . Arthritis   . Diabetes mellitus without complication (HCC)    takes Metformin daily-per pt prediabetes  . History of kidney stones   . Hyperlipidemia    takes Pravastatin daily  . Hypertension    takes Amlodipine and Valsartan-HCTZ daily  . Hypothyroidism    takes Synthroid daily  . Inguinal hernia    left side  . Internal hemorrhoids   . Joint pain   . Kidney stones   . Rosacea   . TIA (transient ischemic attack) 06/2019    PAST SURGICAL HISTORY: Past Surgical History:  Procedure Laterality Date  . COLONOSCOPY    . EXTRACORPOREAL SHOCK WAVE LITHOTRIPSY Right 12/27/2019   Procedure: EXTRACORPOREAL SHOCK WAVE LITHOTRIPSY (ESWL);  Surgeon: Malen Gauze, MD;  Location: [facility];  Service: Urology;  Laterality: Right;  . LITHOTRIPSY  09/2015  . LUMBAR LAMINECTOMY WITH COFLEX 1 LEVEL N/A 07/07/2015   Procedure: LUMBAR FOUR-FIVE LUMBAR LAMINECTOMY WITH COFLEX;  Surgeon: Tia Alert, MD;  Location: [facility];  Service: Neurosurgery;  Laterality: N/A;  . TONSILLECTOMY    . 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   Procedure: TOTAL HIP ARTHROPLASTY;  Surgeon: Frederico Hamman, MD;  Location[facility];  Service: Orthopedics;  Laterality: Left;    FAMILY HISTORY: The patient's family history includes Alcoholism in his mother; Aneurysm in his sister; Atrial fibrillation in his sister; CVA in his father; Cancer in his maternal grandfather; Diabetes in his paternal grandmother; Hypertension in his father, paternal grandmother, and sister; Sleep disorder in his father.   SOCIAL HISTORY:  The patient  reports that he quit smoking about 31 years ago. His smoking use included cigarettes. He has a 15.00 pack-year smoking history. He has never used smokeless tobacco. He reports that he does not drink alcohol and does not use drugs.  Review of Systems   Constitutional: Negative for chills and fever.  HENT: Negative for hoarse voice and nosebleeds.   Eyes: Negative for discharge, double vision and pain.  Cardiovascular: Positive for leg swelling (improved). Negative for chest pain, claudication, dyspnea on exertion, near-syncope, orthopnea, palpitations, paroxysmal nocturnal dyspnea and syncope.  Respiratory: Negative for hemoptysis and shortness of breath.   Musculoskeletal: Negative for muscle cramps and myalgias.  Gastrointestinal: Negative for abdominal pain, constipation, diarrhea, hematemesis, hematochezia, melena, nausea and vomiting.  Neurological: Negative for dizziness and light-headedness.    PHYSICAL EXAM: Vitals with BMI 08/28/2020 03/17/2020 02/21/2020  Height 5\' 9"  5\' 9"  5\' 8"   Weight 216 lbs 214 lbs 213 lbs  BMI 31.88 31.59 32.39  Systolic 127 119 161  Diastolic 76 72 67  Pulse 67 52 66    CONSTITUTIONAL: Well-developed and well-nourished. No acute distress.  SKIN: Skin is warm and dry. No rash noted. No cyanosis. No pallor. No jaundice HEAD: Normocephalic and atraumatic.  EYES: No  scleral icterus MOUTH/THROAT: Moist oral membranes.  NECK: No JVD present. No thyromegaly noted. No carotid bruits  LYMPHATIC: No visible cervical adenopathy.  CHEST Normal respiratory effort. No intercostal retractions  LUNGS: Clear to auscultation bilaterally.  No stridor. No wheezes. No rales.  CARDIOVASCULAR: Regular rate and rhythm, positive S1-S2, no murmurs rubs or gallops appreciated. ABDOMINAL: Soft, nontender, nondistended, positive bowel sounds in all 4 quadrants.  No apparent ascites.  EXTREMITIES: No peripheral edema. Pulses:       Femoral pulses are 2+ on the right side and 2+ on the left side.      Popliteal pulses are 1+ on the right side and 1+ on the left side.       Dorsalis pedis pulses are 1+ on the right side and 1+ on the left side.       Posterior tibial pulses are 1+ on the right side and 1+ on the left side.   HEMATOLOGIC: No significant bruising NEUROLOGIC: Oriented to person, place, and time. Nonfocal. Normal muscle tone.  PSYCHIATRIC: Normal mood and affect. Normal behavior. Cooperative  CARDIAC DATABASE: EKG: 02/21/2020: Normal sinus rhythm, 63 bpm, normal axis, without underlying ischemia or injury pattern, occasional PACs. 08/28/2020: Sinus  Bradycardia, 59bpm, normal axis, without underlying injury  pattern.   Echocardiogram: 02/27/2019: LVEF 55%, normal wall thickness, normal global function, indeterminate diastolic filling pattern, mild TR, RVSP 26 mmHg.  Stress Testing:  Exercise myoview stress test 04/23/2019: Patient exercised for a total of 6:13 min., achieving approximately 7 METs. Normal BP response. Exercise was terminated due to fatigue/weakness and achieving THR. Normal myocardial perfusion. All segments of left ventricle demonstrated normal wall motion and thickening. Stress LV EF is hyperdynamic 75%. Low risk study.   Event Monitor for 30 days Start date 07/11/2019: There were no symptomatic triggered events. Predominant rhythm is normal sinus rhythm. Rare PACs and PVCs. No atrial fibrillation.  21 day event monitor 07/07-07/27/2020: NSR with occasional PAC/PVC. 1 patient triggered event without reported symptoms correlated with NSR with PAC and PVC. 2 auto detected events occurred for NSR with 5 beats of NSVT a t 173 bpm on day 1 at 1453 and NSR with 6 beats of NSVT on day 12 at 1651. Both were asymptomatic. 1 auto detected event on day 14 for Sinus tachycardia with frequent PAC's. No A fib was noted.  LABORATORY DATA: CBC Latest Ref Rng & Units 07/08/2019 07/08/2019 07/25/2016  WBC 4.0 - 10.5 K/uL - 7.3 12.1(H)  Hemoglobin 13.0 - 17.0 g/dL 13.2 44.0 10.2  Hematocrit 39.0 - 52.0 % 44.0 42.9 39.4  Platelets 150 - 400 K/uL - 174 139(L)    CMP Latest Ref Rng & Units 07/08/2019 07/08/2019 04/19/2019  Glucose 70 - 99 mg/dL 725(D) 664(Q) 034(V)  BUN 8 - 23 mg/dL 21 19 18    Creatinine 0.61 - 1.24 mg/dL 4.25 9.56 3.87  Sodium 135 - 145 mmol/L 137 137 137  Potassium 3.5 - 5.1 mmol/L 4.5 4.4 4.3  Chloride 98 - 111 mmol/L 102 102 100  CO2 22 - 32 mmol/L - 25 24  Calcium 8.9 - 10.3 mg/dL - 11.1(H) 10.0  Total Protein 6.5 - 8.1 g/dL - 7.6 -  Total Bilirubin 0.3 - 1.2 mg/dL - 1.3(H) -  Alkaline Phos 38 - 126 U/L - 53 -  AST 15 - 41 U/L - 26 -  ALT 0 - 44 U/L - 27 -    Lipid Panel     Component Value Date/Time   CHOL 139 07/12/2019  1026   TRIG 115 07/12/2019 1026   HDL 46 07/12/2019 1026   CHOLHDL 3.0 07/12/2019 1026   LDLCALC 72 07/12/2019 1026   LABVLDL 21 07/12/2019 1026    Lab Results  Component Value Date   HGBA1C 5.8 (H) 07/12/2016   HGBA1C 6.1 (H) 06/27/2015   No components found for: NTPROBNP No results found for: TSH  Cardiac Panel (last 3 results) No results for input(s): CKTOTAL, CKMB, TROPONINIHS, RELINDX in the last 72 hours.  IMPRESSION:    ICD-10-CM   1. NSVT (nonsustained ventricular tachycardia) (HCC)  I47.2   2. PAC (premature atrial contraction)  I49.1 EKG 12-Lead  3. Essential hypertension  I10   4. TIA (transient ischemic attack)  G45.9   5. Pure hypercholesterolemia  E78.00   6. OSA on CPAP  G47.33    Z99.89   7. Former smoker  Z87.891      RECOMMENDATIONS: Norman Hudson. is a 71 y.o. male whose past medical history and cardiovascular risk factors include: prediabetes, hypertension, hyperlipidemia, chronically low testosterone, hypothyroidism, former smoker with 15 pack year history, anxiety, osteoarthritis, history of TIA, advanced age.  Asymptomatic nonsustained ventricular tachycardia: Stable  Continue beta-blocker therapy.  Patient has undergone ischemic evaluation as outlined above.  Patient remains asymptomatic.  No additional cardiovascular testing needed at this time.  Premature atrial contractions: Resolved.  Continue carvedilol 12.5 mg p.o. twice daily.  Benign essential  hypertension: Improved  Office blood pressures are well controlled.  Medications reconciled.    Patient has not decreased his salt intake significantly.  History of TIA:  Continue Plavix and statin therapy.  Secondary prevention as per the care of neurology and primary team.  Former smoker: Educated on the importance of continued smoking cessation.  No changes were made during today's encounter as patient is stable from a cardiovascular standpoint.  Would recommend follow-up in 1 year or sooner if needed.  FINAL MEDICATION LIST END OF ENCOUNTER: No orders of the defined types were placed in this encounter.   Current Outpatient Medications:  .  ALPRAZolam (XANAX) 0.5 MG tablet, Take 0.5 mg by mouth at bedtime as needed for anxiety., Disp: , Rfl:  .  amLODipine (NORVASC) 5 MG tablet, Take 5 mg by mouth 2 (two) times daily., Disp: , Rfl:  .  carvedilol (COREG) 12.5 MG tablet, TAKE  (1)  TABLET TWICE A DAY WITH MEALS (BREAKFAST AND SUPPER), Disp: 180 tablet, Rfl: 0 .  cholecalciferol (VITAMIN D3) 25 MCG (1000 UT) tablet, Take 1,000 Units by mouth daily., Disp: , Rfl:  .  clonazePAM (KLONOPIN) 0.5 MG tablet, Take 1 tablet (0.5 mg total) by mouth at bedtime as needed for anxiety., Disp: 90 tablet, Rfl: 0 .  clopidogrel (PLAVIX) 75 MG tablet, TAKE 1 TABLET ONCE DAILY., Disp: 90 tablet, Rfl: 0 .  hydrochlorothiazide (HYDRODIURIL) 25 MG tablet, Take 25 mg by mouth daily., Disp: , Rfl:  .  levothyroxine (SYNTHROID, LEVOTHROID) 100 MCG tablet, Take 100 mcg by mouth daily before breakfast., Disp: , Rfl:  .  metFORMIN (GLUCOPHAGE) 500 MG tablet, Take 500 mg by mouth daily with breakfast., Disp: , Rfl:  .  Multiple Vitamins-Minerals (MULTIVITAMIN WITH MINERALS) tablet, Take 1 tablet by mouth daily., Disp: , Rfl:  .  olmesartan (BENICAR) 40 MG tablet, Take 40 mg by mouth daily., Disp: , Rfl:  .  rosuvastatin (CRESTOR) 20 MG tablet, TAKE ONE TABLET AT BEDTIME., Disp: 90 tablet, Rfl: 0 .   spironolactone (ALDACTONE) 25 MG tablet, TAKE 1  TABLET ONCE DAILY., Disp: 90 tablet, Rfl: 3 .  valACYclovir HCl (VALTREX PO), Take 1,000 mg by mouth daily as needed (for herpes flare). , Disp: , Rfl:  .  vitamin B-12 (CYANOCOBALAMIN) 500 MCG tablet, Take 500 mcg by mouth daily., Disp: , Rfl:  .  zinc gluconate 50 MG tablet, Take 50 mg by mouth daily., Disp: , Rfl:  .  pyridOXINE (VITAMIN B-6) 100 MG tablet, Take 1 tablet by mouth daily., Disp: , Rfl:   Orders Placed This Encounter  Procedures  . EKG 12-Lead   --Continue cardiac medications as reconciled in final medication list. --Return in about 1 year (around 08/28/2021) for Follow  up PACs and NSVT. . Or sooner if needed. --Continue follow-up with your primary care physician regarding the management of your other chronic comorbid conditions.  Patient's questions and concerns were addressed to his satisfaction. He voices understanding of the instructions provided during this encounter.   This note was created using a voice recognition software as a result there may be grammatical errors inadvertently enclosed that do not reflect the nature of this encounter. Every attempt is made to correct such errors.  Tessa Lerner, [place], [facility]  Pager: [phone] Office: [phone]

## 2020-09-04 DIAGNOSIS — H35033 Hypertensive retinopathy, bilateral: Secondary | ICD-10-CM | POA: Diagnosis not present

## 2020-09-04 DIAGNOSIS — H2513 Age-related nuclear cataract, bilateral: Secondary | ICD-10-CM | POA: Diagnosis not present

## 2020-09-04 DIAGNOSIS — H25013 Cortical age-related cataract, bilateral: Secondary | ICD-10-CM | POA: Diagnosis not present

## 2020-09-04 DIAGNOSIS — E119 Type 2 diabetes mellitus without complications: Secondary | ICD-10-CM | POA: Diagnosis not present

## 2020-09-08 DIAGNOSIS — I10 Essential (primary) hypertension: Secondary | ICD-10-CM | POA: Diagnosis not present

## 2020-09-08 DIAGNOSIS — Z0000 Encounter for general adult medical examination without abnormal findings: Secondary | ICD-10-CM | POA: Diagnosis not present

## 2020-09-08 DIAGNOSIS — R7303 Prediabetes: Secondary | ICD-10-CM | POA: Diagnosis not present

## 2020-09-08 DIAGNOSIS — Z125 Encounter for screening for malignant neoplasm of prostate: Secondary | ICD-10-CM | POA: Diagnosis not present

## 2020-09-08 DIAGNOSIS — E039 Hypothyroidism, unspecified: Secondary | ICD-10-CM | POA: Diagnosis not present

## 2020-09-08 DIAGNOSIS — E7800 Pure hypercholesterolemia, unspecified: Secondary | ICD-10-CM | POA: Diagnosis not present

## 2020-09-17 DIAGNOSIS — G4733 Obstructive sleep apnea (adult) (pediatric): Secondary | ICD-10-CM | POA: Diagnosis not present

## 2020-09-20 ENCOUNTER — Other Ambulatory Visit: Payer: Self-pay | Admitting: Cardiology

## 2020-09-20 DIAGNOSIS — Z9889 Other specified postprocedural states: Secondary | ICD-10-CM

## 2020-09-29 ENCOUNTER — Other Ambulatory Visit: Payer: Self-pay | Admitting: Cardiology

## 2020-10-15 DIAGNOSIS — G4733 Obstructive sleep apnea (adult) (pediatric): Secondary | ICD-10-CM | POA: Diagnosis not present

## 2020-10-24 ENCOUNTER — Other Ambulatory Visit: Payer: Self-pay | Admitting: Cardiology

## 2020-11-15 DIAGNOSIS — G4733 Obstructive sleep apnea (adult) (pediatric): Secondary | ICD-10-CM | POA: Diagnosis not present

## 2020-12-10 DIAGNOSIS — G4733 Obstructive sleep apnea (adult) (pediatric): Secondary | ICD-10-CM | POA: Diagnosis not present

## 2020-12-18 ENCOUNTER — Other Ambulatory Visit: Payer: Self-pay | Admitting: Cardiology

## 2020-12-18 DIAGNOSIS — Z9889 Other specified postprocedural states: Secondary | ICD-10-CM

## 2020-12-24 DIAGNOSIS — L821 Other seborrheic keratosis: Secondary | ICD-10-CM | POA: Diagnosis not present

## 2020-12-29 ENCOUNTER — Other Ambulatory Visit: Payer: Self-pay | Admitting: Cardiology

## 2021-01-08 ENCOUNTER — Other Ambulatory Visit: Payer: Self-pay | Admitting: Neurology

## 2021-01-10 DIAGNOSIS — G4733 Obstructive sleep apnea (adult) (pediatric): Secondary | ICD-10-CM | POA: Diagnosis not present

## 2021-02-09 DIAGNOSIS — G4733 Obstructive sleep apnea (adult) (pediatric): Secondary | ICD-10-CM | POA: Diagnosis not present

## 2021-02-19 DIAGNOSIS — M545 Low back pain, unspecified: Secondary | ICD-10-CM | POA: Diagnosis not present

## 2021-02-19 DIAGNOSIS — M25551 Pain in right hip: Secondary | ICD-10-CM | POA: Diagnosis not present

## 2021-03-05 DIAGNOSIS — M25551 Pain in right hip: Secondary | ICD-10-CM | POA: Diagnosis not present

## 2021-03-08 ENCOUNTER — Other Ambulatory Visit: Payer: Self-pay | Admitting: Cardiology

## 2021-03-09 DIAGNOSIS — M1610 Unilateral primary osteoarthritis, unspecified hip: Secondary | ICD-10-CM | POA: Diagnosis not present

## 2021-03-09 DIAGNOSIS — I10 Essential (primary) hypertension: Secondary | ICD-10-CM | POA: Diagnosis not present

## 2021-03-09 DIAGNOSIS — E7800 Pure hypercholesterolemia, unspecified: Secondary | ICD-10-CM | POA: Diagnosis not present

## 2021-03-09 DIAGNOSIS — E039 Hypothyroidism, unspecified: Secondary | ICD-10-CM | POA: Diagnosis not present

## 2021-03-09 DIAGNOSIS — R7303 Prediabetes: Secondary | ICD-10-CM | POA: Diagnosis not present

## 2021-03-09 DIAGNOSIS — I700 Atherosclerosis of aorta: Secondary | ICD-10-CM | POA: Diagnosis not present

## 2021-03-18 ENCOUNTER — Ambulatory Visit: Payer: Medicare HMO | Admitting: Neurology

## 2021-03-18 ENCOUNTER — Telehealth: Payer: Self-pay | Admitting: *Deleted

## 2021-03-18 ENCOUNTER — Encounter: Payer: Self-pay | Admitting: Neurology

## 2021-03-18 VITALS — BP 121/73 | HR 63 | Ht 69.0 in | Wt 210.0 lb

## 2021-03-18 DIAGNOSIS — Z9989 Dependence on other enabling machines and devices: Secondary | ICD-10-CM

## 2021-03-18 DIAGNOSIS — G4733 Obstructive sleep apnea (adult) (pediatric): Secondary | ICD-10-CM | POA: Diagnosis not present

## 2021-03-18 DIAGNOSIS — Z950 Presence of cardiac pacemaker: Secondary | ICD-10-CM | POA: Diagnosis not present

## 2021-03-18 DIAGNOSIS — G459 Transient cerebral ischemic attack, unspecified: Secondary | ICD-10-CM | POA: Diagnosis not present
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
## 2021-03-18 NOTE — Patient Instructions (Signed)
  Norman Hudson. is a 70 y.o. year old Caucasian Stroke Service patient seen here face to face on 03/18/2021 in a RV.  Patient was also referred by Dr. Irven Shelling office. Dr. Rex Kras, DO. Last visit was with Frann Rider, NP.   I have here note from Raliegh Ip of the patient's orthopedic specialist where he is scheduled for a hip surgery total hip replacement of the right hip in the near future so his orthopedics surgeon would like a surgical clearance from STROKE, MD,  related to OSA on CPAP , PACs, TIA.  . The patient is a compliant CPAP user with 87% compliance by days and hours with an average use at time of 6 hours 39 minutes with CPAP the patient uses a minimum pressure of 7 maximum pressure of 18 cmH2O with 1 cm expiratory pressure relief.  His residual AHI is 4.1 which is good, he does have small number of central apneas arising he does not have significant air leakage he does require at the 95th percentile a CPAP pressure of 16 cmH2O.    Based on his CPAP compliance record I do not see an elevated surgical risk for the patient.   He is also a stroke patient of Dr. Clydene Fake and Frann Rider he has been on Norvasc, Coreg, Klonopin, Plavix, hydrocele, Synthroid, Glucophage, Benicar, Crestor and Aldactone based on his Plavix as being the only blood thinner, for the antiplatelet agent I think he should be fine to discontinue Plavix 5 days before surgery and to restart as soon as he is able to take oral medication again.  So from his obstructive sleep apnea standpoint I have no concerns and as to his Plavix requirements a 5-day pause prior to surgery should be fine.

## 2021-03-18 NOTE — Telephone Encounter (Signed)
Faxed back surgical clearance form back to Raliegh Ip at [phone], attn: Claiborne Billings. Received fax confirmation. On cpap, compliant. Stop Plavix 5 days prior to surgery. Current BMI: 31

## 2021-03-19 ENCOUNTER — Other Ambulatory Visit: Payer: Self-pay | Admitting: Cardiology

## 2021-03-19 DIAGNOSIS — Z9889 Other specified postprocedural states: Secondary | ICD-10-CM

## 2021-03-20 ENCOUNTER — Ambulatory Visit: Payer: Medicare HMO | Admitting: Cardiology

## 2021-03-20 ENCOUNTER — Other Ambulatory Visit: Payer: Self-pay

## 2021-03-20 DIAGNOSIS — I472 Ventricular tachycardia: Secondary | ICD-10-CM | POA: Diagnosis not present

## 2021-03-20 DIAGNOSIS — Z01818 Encounter for other preprocedural examination: Secondary | ICD-10-CM | POA: Diagnosis not present

## 2021-03-20 DIAGNOSIS — Z01810 Encounter for preprocedural cardiovascular examination: Secondary | ICD-10-CM | POA: Diagnosis not present

## 2021-03-22 NOTE — Progress Notes (Signed)
Patient presents today for preop EKG.  03/20/2021: Sinus bradycardia, 57 bpm, normal axis, without underlying ischemia or injury pattern, occasional PACs.  Rex Kras, [place], [facility]  Pager: [phone] Office: [phone]

## 2021-03-24 ENCOUNTER — Other Ambulatory Visit: Payer: Self-pay | Admitting: Physician Assistant

## 2021-03-26 DIAGNOSIS — G4733 Obstructive sleep apnea (adult) (pediatric): Secondary | ICD-10-CM | POA: Diagnosis not present

## 2021-03-30 DIAGNOSIS — M25551 Pain in right hip: Secondary | ICD-10-CM | POA: Diagnosis not present

## 2021-03-31 ENCOUNTER — Other Ambulatory Visit: Payer: Self-pay | Admitting: Cardiology

## 2021-03-31 ENCOUNTER — Ambulatory Visit: Payer: Self-pay | Admitting: Physician Assistant

## 2021-03-31 NOTE — H&P (Signed)
TOTAL HIP ADMISSION H&P  Patient is admitted for right total hip arthroplasty.  Subjective:  Chief Complaint: right hip pain  HPI: Norman Hudson., 70 y.o. male, has a history of pain and functional disability in the right hip(s) due to arthritis and patient has failed non-surgical conservative treatments for greater than 12 weeks to include NSAID's and/or analgesics, corticosteriod injections, and activity modification.  Onset of symptoms was gradual starting 8 years ago with gradually worsening course since that time.The patient noted no past surgery on the right hip(s).  Patient currently rates pain in the right hip at 10 out of 10 with activity. Patient has night pain, worsening of pain with activity and weight bearing, trendelenberg gait, pain that interfers with activities of daily living, and pain with passive range of motion. Patient has evidence of periarticular osteophytes and joint space narrowing by imaging studies. This condition presents safety issues increasing the risk of falls. There is no current active infection.  Patient Active Problem List   Diagnosis Date Noted   Pacemaker ECG pattern 03/18/2021   TIA (transient ischemic attack) 03/18/2021   OSA on CPAP 03/17/2020   Uncontrolled REM sleep behavior disorder 04/10/2019   Loud snoring 04/10/2019   Excessive daytime sleepiness 04/10/2019   Other fatigue 04/10/2019   Nocturia more than twice per night 04/10/2019   Malignant hypertension 04/10/2019   Low testosterone 04/03/2018   Osteoarthritis of left hip 07/23/2016   S/P lumbar laminectomy 07/07/2015   Past Medical History:  Diagnosis Date   Acne    Anxiety    takes Xanax daily as needed   Arthritis    Diabetes mellitus without complication ([place])    takes Metformin daily-per pt prediabetes   History of kidney stones    Hyperlipidemia    takes Pravastatin daily   Hypertension    takes Amlodipine and Valsartan-HCTZ daily   Hypothyroidism    takes  Synthroid daily   Inguinal hernia    left side   Internal hemorrhoids    Joint pain    Kidney stones    Rosacea    TIA (transient ischemic attack) 06/2019    Past Surgical History:  Procedure Laterality Date   COLONOSCOPY     EXTRACORPOREAL SHOCK WAVE LITHOTRIPSY Right 12/27/2019   Procedure: EXTRACORPOREAL SHOCK WAVE LITHOTRIPSY (ESWL);  Surgeon: Cleon Gustin, MD;  Location: [facility];  Service: Urology;  Laterality: Right;   LITHOTRIPSY  09/2015   LUMBAR LAMINECTOMY WITH COFLEX 1 LEVEL N/A 07/07/2015   Procedure: LUMBAR FOUR-FIVE LUMBAR LAMINECTOMY WITH COFLEX;  Surgeon: Eustace Moore, MD;  Location: [place] [facility];  Service: Neurosurgery;  Laterality: N/A;   TONSILLECTOMY     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   Procedure: TOTAL HIP ARTHROPLASTY;  Surgeon: Earlie Server, MD;  Location: [facility];  Service: Orthopedics;  Laterality: Left;    Current Outpatient Medications  Medication Sig Dispense Refill Last Dose   ALPRAZolam (XANAX) 0.5 MG tablet Take 0.5 mg by mouth at bedtime as needed for anxiety.      amLODipine (NORVASC) 5 MG tablet Take 5 mg by mouth 2 (two) times daily.      carvedilol (COREG) 12.5 MG tablet TAKE (1) TABLET TWICE A DAY WITH MEALS (BREAKFAST AND SUPPER) 180 tablet 0    cholecalciferol (VITAMIN D3) 25 MCG (1000 UT) tablet Take 1,000 Units by mouth daily.      clonazePAM (KLONOPIN) 0.5 MG tablet TAKE 1 TABLET AT BEDTIME AS NEEDED FOR ANXIETY. [place]  tablet 0    clopidogrel (PLAVIX) 75 MG tablet TAKE 1 TABLET ONCE DAILY. 90 tablet 1    hydrochlorothiazide (HYDRODIURIL) 25 MG tablet Take 25 mg by mouth daily.      levothyroxine (SYNTHROID, LEVOTHROID) 100 MCG tablet Take 100 mcg by mouth daily before breakfast.      metFORMIN (GLUCOPHAGE) 500 MG tablet Take 500 mg by mouth daily with breakfast.      Multiple Vitamins-Minerals (MULTIVITAMIN WITH MINERALS) tablet Take 1 tablet by mouth daily.      olmesartan (BENICAR) 40 MG tablet Take 40 mg by  mouth daily.      pyridOXINE (VITAMIN B-6) 100 MG tablet Take 1 tablet by mouth daily.      rosuvastatin (CRESTOR) 20 MG tablet TAKE ONE TABLET AT BEDTIME. 90 tablet 2    spironolactone (ALDACTONE) 25 MG tablet TAKE 1 TABLET ONCE DAILY. 90 tablet 3    valACYclovir HCl (VALTREX PO) Take 1,000 mg by mouth daily as needed (for herpes flare).       vitamin B-12 (CYANOCOBALAMIN) 500 MCG tablet Take 500 mcg by mouth daily.      No current facility-administered medications for this visit.   Allergies  Allergen Reactions   Bactrim [Sulfamethoxazole-Trimethoprim]     "threw me for a loop, hives, itching, extreme fatigue"   Tramadol Itching and Rash    Social History   Tobacco Use   Smoking status: Former    Packs/day: 1.00    Years: 15.00    Pack years: 15.00    Types: Cigarettes    Quit date: 02/12/1989    Years since quitting: 32.1   Smokeless tobacco: Never   Tobacco comments:    quit smoking in 1991  Substance Use Topics   Alcohol use: No    Family History  Problem Relation Age of Onset   Hypertension Father    Sleep disorder Father    CVA Father    Cancer Maternal Grandfather    Hypertension Paternal Grandmother    Diabetes Paternal Grandmother    Alcoholism Mother    Hypertension Sister    Aneurysm Sister    Atrial fibrillation Sister    Other Neg Hx        hypogonadism   Sleep apnea Neg Hx      Review of Systems  Constitutional:  Positive for activity change.  Cardiovascular:  Positive for leg swelling.  Musculoskeletal:  Positive for arthralgias.  All other systems reviewed and are negative.  Objective:  Physical Exam Constitutional:      General: He is not in acute distress.    Appearance: Normal appearance.  HENT:     Head: Normocephalic and atraumatic.  Eyes:     Extraocular Movements: Extraocular movements intact.     Pupils: Pupils are equal, round, and reactive to light.  Cardiovascular:     Rate and Rhythm: Normal rate and regular rhythm.      Pulses: Normal pulses.     Heart sounds: Normal heart sounds.  Pulmonary:     Effort: Pulmonary effort is normal. No respiratory distress.     Breath sounds: Normal breath sounds.  Abdominal:     General: Abdomen is flat. Bowel sounds are normal. There is no distension.     Palpations: Abdomen is soft.     Tenderness: There is no abdominal tenderness.  Musculoskeletal:     Cervical back: Normal range of motion and neck supple.     Right hip: Tenderness and bony tenderness present. Decreased  range of motion. Decreased strength.  Lymphadenopathy:     Cervical: No cervical adenopathy.  Skin:    General: Skin is warm and dry.     Findings: No erythema or rash.  Neurological:     General: No focal deficit present.     Mental Status: He is alert and oriented to person, place, and time.  Psychiatric:        Mood and Affect: Mood normal.        Behavior: Behavior normal.    Vital signs in last 24 hours: '@VSRANGES'$ @  Labs:   Estimated body mass index is 31.01 kg/m as calculated from the following:   Height as of 03/18/21: '5\' 9"'$  (1.753 m).   Weight as of 03/18/21: 95.3 kg.   Imaging Review Plain radiographs demonstrate moderate degenerative joint disease of the right hip(s). The bone quality appears to be good for age and reported activity level.      Assessment/Plan:  End stage arthritis, right hip(s)  The patient history, physical examination, clinical judgement of the provider and imaging studies are consistent with end stage degenerative joint disease of the right hip(s) and total hip arthroplasty is deemed medically necessary. The treatment options including medical management, injection therapy, arthroscopy and arthroplasty were discussed at length. The risks and benefits of total hip arthroplasty were presented and reviewed. The risks due to aseptic loosening, infection, stiffness, dislocation/subluxation,  thromboembolic complications and other imponderables were discussed.   The patient acknowledged the explanation, agreed to proceed with the plan and consent was signed. Patient is being admitted for inpatient treatment for surgery, pain control, PT, OT, prophylactic antibiotics, VTE prophylaxis, progressive ambulation and ADL's and discharge planning.The patient is planning to be discharged  home with outpt PT.   Anticipated LOS equal to or greater than 2 midnights due to - Age 29 and older with one or more of the following:  - Obesity  - Expected need for hospital services (PT, OT, Nursing) required for safe  discharge  - Anticipated need for postoperative skilled nursing care or inpatient rehab  - Active co-morbidities: Chronic pain requiring opiods, Stroke, and OSA OR   - Unanticipated findings during/Post Surgery: None  - Patient is a high risk of re-admission due to: None

## 2021-03-31 NOTE — H&P (View-Only) (Signed)
TOTAL HIP ADMISSION H&P  Patient is admitted for right total hip arthroplasty.  Subjective:  Chief Complaint: right hip pain  HPI: Norman Hudson., 70 y.o. male, has a history of pain and functional disability in the right hip(s) due to arthritis and patient has failed non-surgical conservative treatments for greater than 12 weeks to include NSAID's and/or analgesics, corticosteriod injections, and activity modification.  Onset of symptoms was gradual starting 8 years ago with gradually worsening course since that time.The patient noted no past surgery on the right hip(s).  Patient currently rates pain in the right hip at 10 out of 10 with activity. Patient has night pain, worsening of pain with activity and weight bearing, trendelenberg gait, pain that interfers with activities of daily living, and pain with passive range of motion. Patient has evidence of periarticular osteophytes and joint space narrowing by imaging studies. This condition presents safety issues increasing the risk of falls. There is no current active infection.  Patient Active Problem List   Diagnosis Date Noted   Pacemaker ECG pattern 03/18/2021   TIA (transient ischemic attack) 03/18/2021   OSA on CPAP 03/17/2020   Uncontrolled REM sleep behavior disorder 04/10/2019   Loud snoring 04/10/2019   Excessive daytime sleepiness 04/10/2019   Other fatigue 04/10/2019   Nocturia more than twice per night 04/10/2019   Malignant hypertension 04/10/2019   Low testosterone 04/03/2018   Osteoarthritis of left hip 07/23/2016   S/P lumbar laminectomy 07/07/2015   Past Medical History:  Diagnosis Date   Acne    Anxiety    takes Xanax daily as needed   Arthritis    Diabetes mellitus without complication ([place])    takes Metformin daily-per pt prediabetes   History of kidney stones    Hyperlipidemia    takes Pravastatin daily   Hypertension    takes Amlodipine and Valsartan-HCTZ daily   Hypothyroidism    takes  Synthroid daily   Inguinal hernia    left side   Internal hemorrhoids    Joint pain    Kidney stones    Rosacea    TIA (transient ischemic attack) 06/2019    Past Surgical History:  Procedure Laterality Date   COLONOSCOPY     EXTRACORPOREAL SHOCK WAVE LITHOTRIPSY Right 12/27/2019   Procedure: EXTRACORPOREAL SHOCK WAVE LITHOTRIPSY (ESWL);  Surgeon: Cleon Gustin, MD;  Location: [facility];  Service: Urology;  Laterality: Right;   LITHOTRIPSY  09/2015   LUMBAR LAMINECTOMY WITH COFLEX 1 LEVEL N/A 07/07/2015   Procedure: LUMBAR FOUR-FIVE LUMBAR LAMINECTOMY WITH COFLEX;  Surgeon: Eustace Moore, MD;  Location: [place] [facility];  Service: Neurosurgery;  Laterality: N/A;   TONSILLECTOMY     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   Procedure: TOTAL HIP ARTHROPLASTY;  Surgeon: Earlie Server, MD;  Location: [place];  Service: Orthopedics;  Laterality: Left;    Current Outpatient Medications  Medication Sig Dispense Refill Last Dose   ALPRAZolam (XANAX) 0.5 MG tablet Take 0.5 mg by mouth at bedtime as needed for anxiety.      amLODipine (NORVASC) 5 MG tablet Take 5 mg by mouth 2 (two) times daily.      carvedilol (COREG) 12.5 MG tablet TAKE (1) TABLET TWICE A DAY WITH MEALS (BREAKFAST AND SUPPER) 180 tablet 0    cholecalciferol (VITAMIN D3) 25 MCG (1000 UT) tablet Take 1,000 Units by mouth daily.      clonazePAM (KLONOPIN) 0.5 MG tablet TAKE 1 TABLET AT BEDTIME AS NEEDED FOR ANXIETY. [place]  tablet 0    clopidogrel (PLAVIX) 75 MG tablet TAKE 1 TABLET ONCE DAILY. 90 tablet 1    hydrochlorothiazide (HYDRODIURIL) 25 MG tablet Take 25 mg by mouth daily.      levothyroxine (SYNTHROID, LEVOTHROID) 100 MCG tablet Take 100 mcg by mouth daily before breakfast.      metFORMIN (GLUCOPHAGE) 500 MG tablet Take 500 mg by mouth daily with breakfast.      Multiple Vitamins-Minerals (MULTIVITAMIN WITH MINERALS) tablet Take 1 tablet by mouth daily.      olmesartan (BENICAR) 40 MG tablet Take 40 mg by  mouth daily.      pyridOXINE (VITAMIN B-6) 100 MG tablet Take 1 tablet by mouth daily.      rosuvastatin (CRESTOR) 20 MG tablet TAKE ONE TABLET AT BEDTIME. 90 tablet 2    spironolactone (ALDACTONE) 25 MG tablet TAKE 1 TABLET ONCE DAILY. 90 tablet 3    valACYclovir HCl (VALTREX PO) Take 1,000 mg by mouth daily as needed (for herpes flare).       vitamin B-12 (CYANOCOBALAMIN) 500 MCG tablet Take 500 mcg by mouth daily.      No current facility-administered medications for this visit.   Allergies  Allergen Reactions   Bactrim [Sulfamethoxazole-Trimethoprim]     "threw me for a loop, hives, itching, extreme fatigue"   Tramadol Itching and Rash    Social History   Tobacco Use   Smoking status: Former    Packs/day: 1.00    Years: 15.00    Pack years: 15.00    Types: Cigarettes    Quit date: 02/12/1989    Years since quitting: 32.1   Smokeless tobacco: Never   Tobacco comments:    quit smoking in 1991  Substance Use Topics   Alcohol use: No    Family History  Problem Relation Age of Onset   Hypertension Father    Sleep disorder Father    CVA Father    Cancer Maternal Grandfather    Hypertension Paternal Grandmother    Diabetes Paternal Grandmother    Alcoholism Mother    Hypertension Sister    Aneurysm Sister    Atrial fibrillation Sister    Other Neg Hx        hypogonadism   Sleep apnea Neg Hx      Review of Systems  Constitutional:  Positive for activity change.  Cardiovascular:  Positive for leg swelling.  Musculoskeletal:  Positive for arthralgias.  All other systems reviewed and are negative.  Objective:  Physical Exam Constitutional:      General: He is not in acute distress.    Appearance: Normal appearance.  HENT:     Head: Normocephalic and atraumatic.  Eyes:     Extraocular Movements: Extraocular movements intact.     Pupils: Pupils are equal, round, and reactive to light.  Cardiovascular:     Rate and Rhythm: Normal rate and regular rhythm.      Pulses: Normal pulses.     Heart sounds: Normal heart sounds.  Pulmonary:     Effort: Pulmonary effort is normal. No respiratory distress.     Breath sounds: Normal breath sounds.  Abdominal:     General: Abdomen is flat. Bowel sounds are normal. There is no distension.     Palpations: Abdomen is soft.     Tenderness: There is no abdominal tenderness.  Musculoskeletal:     Cervical back: Normal range of motion and neck supple.     Right hip: Tenderness and bony tenderness present. Decreased  range of motion. Decreased strength.  Lymphadenopathy:     Cervical: No cervical adenopathy.  Skin:    General: Skin is warm and dry.     Findings: No erythema or rash.  Neurological:     General: No focal deficit present.     Mental Status: He is alert and oriented to person, place, and time.  Psychiatric:        Mood and Affect: Mood normal.        Behavior: Behavior normal.    Vital signs in last 24 hours: '@VSRANGES'$ @  Labs:   Estimated body mass index is 31.01 kg/m as calculated from the following:   Height as of 03/18/21: '5\' 9"'$  (1.753 m).   Weight as of 03/18/21: 95.3 kg.   Imaging Review Plain radiographs demonstrate moderate degenerative joint disease of the right hip(s). The bone quality appears to be good for age and reported activity level.      Assessment/Plan:  End stage arthritis, right hip(s)  The patient history, physical examination, clinical judgement of the provider and imaging studies are consistent with end stage degenerative joint disease of the right hip(s) and total hip arthroplasty is deemed medically necessary. The treatment options including medical management, injection therapy, arthroscopy and arthroplasty were discussed at length. The risks and benefits of total hip arthroplasty were presented and reviewed. The risks due to aseptic loosening, infection, stiffness, dislocation/subluxation,  thromboembolic complications and other imponderables were discussed.   The patient acknowledged the explanation, agreed to proceed with the plan and consent was signed. Patient is being admitted for inpatient treatment for surgery, pain control, PT, OT, prophylactic antibiotics, VTE prophylaxis, progressive ambulation and ADL's and discharge planning.The patient is planning to be discharged  home with outpt PT.   Anticipated LOS equal to or greater than 2 midnights due to - Age 23 and older with one or more of the following:  - Obesity  - Expected need for hospital services (PT, OT, Nursing) required for safe  discharge  - Anticipated need for postoperative skilled nursing care or inpatient rehab  - Active co-morbidities: Chronic pain requiring opiods, Stroke, and OSA OR   - Unanticipated findings during/Post Surgery: None  - Patient is a high risk of re-admission due to: None

## 2021-04-13 NOTE — Progress Notes (Signed)
See the note already filed for 03/20/2021 at 1445.   Closing this encounter.   Rex Kras, [place], [facility]  Pager: [phone] Office: [phone]

## 2021-04-16 ENCOUNTER — Other Ambulatory Visit: Payer: Self-pay

## 2021-04-16 ENCOUNTER — Encounter (HOSPITAL_COMMUNITY): Payer: Self-pay

## 2021-04-16 ENCOUNTER — Encounter (HOSPITAL_COMMUNITY)
Admission: RE | Admit: 2021-04-16 | Discharge: 2021-04-16 | Disposition: A | Discharge status: Home / Self Care | Payer: Medicare HMO | Source: Ambulatory Visit | Attending: Orthopedic Surgery | Admitting: Orthopedic Surgery

## 2021-04-16 DIAGNOSIS — Z01812 Encounter for preprocedural laboratory examination: Secondary | ICD-10-CM | POA: Insufficient documentation

## 2021-04-16 HISTORY — DX: Prediabetes: R73.03

## 2021-04-16 LAB — CBC WITH DIFFERENTIAL/PLATELET
Abs Immature Granulocytes: 0.04 10*3/uL (ref 0.00–0.07)
Basophils Absolute: 0 10*3/uL (ref 0.0–0.1)
Basophils Relative: 1 %
Eosinophils Absolute: 0.2 10*3/uL (ref 0.0–0.5)
Eosinophils Relative: 2 %
HCT: 37.1 % — ABNORMAL LOW (ref 39.0–52.0)
Hemoglobin: 12.7 g/dL — ABNORMAL LOW (ref 13.0–17.0)
Immature Granulocytes: 1 %
Lymphocytes Relative: 24 %
Lymphs Abs: 1.8 10*3/uL (ref 0.7–4.0)
MCH: 34 pg (ref 26.0–34.0)
MCHC: 34.2 g/dL (ref 30.0–36.0)
MCV: 99.2 fL (ref 80.0–100.0)
Monocytes Absolute: 0.9 10*3/uL (ref 0.1–1.0)
Monocytes Relative: 12 %
Neutro Abs: 4.6 10*3/uL (ref 1.7–7.7)
Neutrophils Relative %: 60 %
Platelets: 206 10*3/uL (ref 150–400)
RBC: 3.74 MIL/uL — ABNORMAL LOW (ref 4.22–5.81)
RDW: 12.2 % (ref 11.5–15.5)
WBC: 7.6 10*3/uL (ref 4.0–10.5)
nRBC: 0 % (ref 0.0–0.2)

## 2021-04-16 LAB — COMPREHENSIVE METABOLIC PANEL
ALT: 20 U/L (ref 0–44)
AST: 25 U/L (ref 15–41)
Albumin: 4.5 g/dL (ref 3.5–5.0)
Alkaline Phosphatase: 52 U/L (ref 38–126)
Anion gap: 9 (ref 5–15)
BUN: 26 mg/dL — ABNORMAL HIGH (ref 8–23)
CO2: 26 mmol/L (ref 22–32)
Calcium: 9.8 mg/dL (ref 8.9–10.3)
Chloride: 104 mmol/L (ref 98–111)
Creatinine, Ser: 1.1 mg/dL (ref 0.61–1.24)
GFR, Estimated: >60 mL/min (ref >60)
Glucose, Bld: 121 mg/dL — ABNORMAL HIGH (ref 70–99)
Potassium: 4.6 mmol/L (ref 3.5–5.1)
Sodium: 139 mmol/L (ref 135–145)
Total Bilirubin: 0.8 mg/dL (ref 0.3–1.2)
Total Protein: 7.7 g/dL (ref 6.5–8.1)

## 2021-04-16 LAB — PROTIME-INR
INR: 1.1 (ref 0.8–1.2)
Prothrombin Time: 14.4 seconds (ref 11.4–15.2)

## 2021-04-16 LAB — GLUCOSE, CAPILLARY: Glucose-Capillary: 119 mg/dL — ABNORMAL HIGH (ref 70–99)

## 2021-04-16 LAB — HEMOGLOBIN A1C
Hgb A1c MFr Bld: 6 % — ABNORMAL HIGH (ref 4.8–5.6)
Mean Plasma Glucose: 125.5 mg/dL

## 2021-04-16 LAB — SURGICAL PCR SCREEN
MRSA, PCR: NEGATIVE
Staphylococcus aureus: NEGATIVE

## 2021-04-16 LAB — APTT: aPTT: 29 seconds (ref 24–36)

## 2021-04-16 NOTE — Progress Notes (Addendum)
COVID test Completed:9/14 son & Johnson's   PCP - Dr. Quinn Axe LOV  08/13/20-epic Cardiologist - Dr. Brennan Bailey LOV 03/20/21-epic  Chest x-ray - no EKG - 1./20/22-epic Stress Test - 04/23/19 ECHO - 02/27/19 Cardiac Cath - no Pacemaker/ICD device last checked:NA  Sleep Study - yes. Sees Dr. Brett Fairy CPAP - yes  Fasting Blood Sugar - NA Checks Blood Sugar _____ times a day  Blood Thinner Instructions:Plavix/Dr. Terri Skains.  Aspirin Instructions:Stop 7 days prior to DOS/ Dr. French Ana Last Dose:04/16/21  Anesthesia review: yes  Patient denies shortness of breath, fever, cough and chest pain at PAT appointment Yes. No SOB with any activities. Pt is pre diabetic CBG was 119.   Patient verbalized understanding of instructions that were given to them at the PAT appointment. Patient was also instructed that they will need to review over the PAT instructions again at home before surgery. Yes

## 2021-04-16 NOTE — Patient Instructions (Signed)
DUE TO COVID-19 ONLY ONE VISITOR IS ALLOWED TO COME WITH YOU AND STAY IN THE WAITING ROOM ONLY DURING PRE OP AND PROCEDURE DAY OF SURGERY IF YOU ARE GOING HOME AFTER SURGERY. IF YOU ARE SPENDING THE NIGHT 2 PEOPLE MAY VISIT WITH YOU IN YOUR PRIVATE ROOM AFTER SURGERY UNTIL VISITING  HOURS ARE OVER AT 800 PM AND THE 2 VISITORS CANNOT SPEND THE NIGHT.   YOU NEED TO HAVE A COVID 19 TEST ON__9/14____THIS TEST MUST BE DONE BEFORE SURGERY,  COVID TESTING SITE  IS LOCATED AT [place], [place]. REMAIN IN YOUR CAR THIS IS A DRIVE UP TEST. AFTER YOUR COVID TEST PLEASE WEAR A MASK OUT IN PUBLIC AND SOCIAL DISTANCE AND [place] YOUR HANDS FREQUENTLY, ALSO ASK ALL YOUR CLOSE CONTACT PERSONS TO WEAR A MASK AND SOCIAL DISTANCE AND [place] THEIR HANDS FREQUENTLY ALSO.               Norman Hudson.     Your procedure is scheduled on: 04/24/21   Report to [facility] Main  Entrance   Report to Short stay 5:15 AM     Call this number if you have problems the morning of surgery [phone]    Remember: Do not eat food  :After Midnight the night before your surgery,     You may have clear liquids from midnight until 4:30 am.  Bring mask and tubing  CLEAR LIQUID DIET   Foods Allowed                                                                     Foods Excluded  Coffee and tea, regular and decaf                             liquids that you cannot  No Milk or cream in coffee Plain Jell-O any favor except red or purple                                           see through such as: Fruit ices (not with fruit pulp)                                     milk, soups, orange juice  Iced Popsicles                                    All solid food Carbonated beverages, regular and diet                                    Cranberry, grape and apple juices Sports drinks like Gatorade Lightly seasoned clear broth or consume(fat free) Sugar    BRUSH YOUR TEETH MORNING OF SURGERY  AND RINSE YOUR MOUTH OUT, NO CHEWING GUM CANDY OR MINTS.     Take these medicines the morning of surgery  with A SIP OF WATER: Carvedilol. Amlodipine, Synthroid  Stop taking _Plavix__________on _9/8_________as instructed by _Dr. Tulis____________.                                   You may not have any metal on your body including              piercings  Do not wear jewelry,  lotions, powders or deodorant                          Men may shave face and neck.   Do not bring valuables to the hospital. [place].  Contacts, dentures or bridgework may not be worn into surgery.         Name and phone number of your driver:  Special Instructions: N/A              Please read over the following fact sheets you were given: _____________________________________________________________________             [facility] - Preparing for Surgery Before surgery, you can play an important role.  Because skin is not sterile, your skin needs to be as free of germs as possible.  You can reduce the number of germs on your skin by washing with CHG (chlorahexidine gluconate) soap before surgery.  CHG is an antiseptic cleaner which kills germs and bonds with the skin to continue killing germs even after washing. Please DO NOT use if you have an allergy to CHG or antibacterial soaps.  If your skin becomes reddened/irritated stop using the CHG and inform your nurse when you arrive at Short Stay. Do not shave (including legs and underarms) for at least 48 hours prior to the first CHG shower.  You may shave your face/neck. Please follow these instructions carefully:  1.  Shower with CHG Soap the night before surgery and the  morning of Surgery.  2.  If you choose to wash your hair, wash your hair first as usual with your  normal  shampoo.  3.  After you shampoo, rinse your hair and body thoroughly to remove the  shampoo.                           4.  Use CHG as  you would any other liquid soap.  You can apply chg directly  to the skin and wash                       Gently with a scrungie or clean washcloth.  5.  Apply the CHG Soap to your body ONLY FROM THE NECK DOWN.   Do not use on face/ open                           Wound or open sores. Avoid contact with eyes, ears mouth and genitals (private parts).                       Wash face,  Genitals (private parts) with your normal soap.             6.  Wash thoroughly, paying special attention to the area where  your surgery  will be performed.  7.  Thoroughly rinse your body with warm water from the neck down.  8.  DO NOT shower/wash with your normal soap after using and rinsing off  the CHG Soap.                9.  Pat yourself dry with a clean towel.            10.  Wear clean pajamas.            11.  Place clean sheets on your bed the night of your first shower and do not  sleep with pets. Day of Surgery : Do not apply any lotions/deodorants the morning of surgery.  Please wear clean clothes to the hospital/surgery center.  FAILURE TO FOLLOW THESE INSTRUCTIONS MAY RESULT IN THE CANCELLATION OF YOUR SURGERY PATIENT SIGNATURE_________________________________  NURSE SIGNATURE__________________________________  ________________________________________________________________________   Adam Phenix  An incentive spirometer is a tool that can help keep your lungs clear and active. This tool measures how well you are filling your lungs with each breath. Taking long deep breaths may help reverse or decrease the chance of developing breathing (pulmonary) problems (especially infection) following: A long period of time when you are unable to move or be active. BEFORE THE PROCEDURE  If the spirometer includes an indicator to show your best effort, your nurse or respiratory therapist will set it to a desired goal. If possible, sit up straight or lean slightly forward. Try not to slouch. Hold the  incentive spirometer in an upright position. INSTRUCTIONS FOR USE  Sit on the edge of your bed if possible, or sit up as far as you can in bed or on a chair. Hold the incentive spirometer in an upright position. Breathe out normally. Place the mouthpiece in your mouth and seal your lips tightly around it. Breathe in slowly and as deeply as possible, raising the piston or the ball toward the top of the column. Hold your breath for 3-5 seconds or for as long as possible. Allow the piston or ball to fall to the bottom of the column. Remove the mouthpiece from your mouth and breathe out normally. Rest for a few seconds and repeat Steps 1 through 7 at least 10 times every 1-2 hours when you are awake. Take your time and take a few normal breaths between deep breaths. The spirometer may include an indicator to show your best effort. Use the indicator as a goal to work toward during each repetition. After each set of 10 deep breaths, practice coughing to be sure your lungs are clear. If you have an incision (the cut made at the time of surgery), support your incision when coughing by placing a pillow or rolled up towels firmly against it. Once you are able to get out of bed, walk around indoors and cough well. You may stop using the incentive spirometer when instructed by your caregiver.  RISKS AND COMPLICATIONS Take your time so you do not get dizzy or light-headed. If you are in pain, you may need to take or ask for pain medication before doing incentive spirometry. It is harder to take a deep breath if you are having pain. AFTER USE Rest and breathe slowly and easily. It can be helpful to keep track of a log of your progress. Your caregiver can provide you with a simple table to help with this. If you are using the spirometer at home, follow these instructions: [place] IF:  You are having difficultly using the spirometer. You have trouble using the spirometer as often as instructed. Your  pain medication is not giving enough relief while using the spirometer. You develop fever of 100.5 F (38.1 C) or higher. SEEK IMMEDIATE MEDICAL CARE IF:  You cough up bloody sputum that had not been present before. You develop fever of 102 F (38.9 C) or greater. You develop worsening pain at or near the incision site. MAKE SURE YOU:  Understand these instructions. Will watch your condition. Will get help right away if you are not doing well or get worse. Document Released: 12/06/2006 Document Revised: 10/18/2011 Document Reviewed: 02/06/2007 [facility] Patient Information 2014 [place], [place].   ________________________________________________________________________

## 2021-04-20 NOTE — Progress Notes (Signed)
Anesthesia Chart Review:   Case: [id number] Date/Time: 04/24/21 0715   Procedure: TOTAL HIP ARTHROPLASTY (Right: Hip)   Anesthesia type: Choice   Pre-op diagnosis: OA RIGHT HIP   Location: [place] / [facility]   Surgeons: Earlie Server, MD       DISCUSSION: Pt is 70 years old with hx  HTN, TIA, pre-diabetes  Pt to hold plavix 7 days before surgery   VS: BP 124/63   Pulse 68   Temp 36.8 C (Oral)   Resp 20   Ht '5\' 6"'$  (1.676 m)   Wt 92.1 kg   SpO2 98%   BMI 32.77 kg/m   PROVIDERS: - PCP is Glenis Smoker, MD - Cardiologist is Rex Kras, DO who sees pt for HTN and PAC. Last office visit 08/28/20   LABS: Labs reviewed: Acceptable for surgery. (all labs ordered are listed, but only abnormal results are displayed)  Labs Reviewed  CBC WITH DIFFERENTIAL/PLATELET - Abnormal; Notable for the following components:      Result Value   RBC 3.74 (*)    Hemoglobin 12.7 (*)    HCT 37.1 (*)    All other components within normal limits  COMPREHENSIVE METABOLIC PANEL - Abnormal; Notable for the following components:   Glucose, Bld 121 (*)    BUN 26 (*)    All other components within normal limits  HEMOGLOBIN A1C - Abnormal; Notable for the following components:   Hgb A1c MFr Bld 6.0 (*)    All other components within normal limits  GLUCOSE, CAPILLARY - Abnormal; Notable for the following components:   Glucose-Capillary 119 (*)    All other components within normal limits  SURGICAL PCR SCREEN  PROTIME-INR  APTT  TYPE AND SCREEN    EKG 03/20/21 (date is listed as 04/12/21 in Epic): - Sinus bradycardia, 57 bpm, normal axis, without underlying ischemia or injury pattern, occasional PACs.   CV: Exercise myoview stress test 04/23/19:  - Resting EKG NSR.  Stress EKG is negative for ischemia.  - There were occasional PVCs and paroxysmal episodes of brief 3-8 beat atrial tachycardia during stress testing and into recovery.  - Patient exercised for a total of 6:13 min.,  achieving approximately 7 METs. Normal BP response.  - Exercise was terminated due to fatigue/weakness and achieving THR. - Normal myocardial perfusion. All segments of left ventricle demonstrated normal wall motion and thickening.  - Stress LV EF is hyperdynamic 75%.   -No previous exam available for comparison. Low risk study.   Echo 03/03/19:  - Normal LV systolic function with EF 55%. Left ventricle cavity is normal in size. Normal left ventricular wall thickness. Normal global wall motion. Indeterminate diastolic filling pattern.  - Mild tricuspid regurgitation. Estimated pulmonary artery systolic pressure is 26 mmHg.   Past Medical History:  Diagnosis Date   Acne    Anxiety    takes Xanax daily as needed   Arthritis    History of kidney stones    Hyperlipidemia    takes Pravastatin daily   Hypertension    takes Amlodipine and Valsartan-HCTZ daily   Hypothyroidism    takes Synthroid daily   Inguinal hernia    left side   Internal hemorrhoids    Joint pain    Kidney stones    Pre-diabetes    Rosacea    TIA (transient ischemic attack) 06/2019    Past Surgical History:  Procedure Laterality Date   COLONOSCOPY     EXTRACORPOREAL SHOCK WAVE LITHOTRIPSY  Right 12/27/2019   Procedure: EXTRACORPOREAL SHOCK WAVE LITHOTRIPSY (ESWL);  Surgeon: Cleon Gustin, MD;  [facility]: [facility];  Service: Urology;  Laterality: Right;   LITHOTRIPSY  09/2015   LUMBAR LAMINECTOMY WITH COFLEX 1 LEVEL N/A 07/07/2015   Procedure: LUMBAR FOUR-FIVE LUMBAR LAMINECTOMY WITH COFLEX;  Surgeon: Eustace Moore, MD;  Location: [place] [facility];  Service: Neurosurgery;  Laterality: N/A;   TONSILLECTOMY     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   Procedure: TOTAL HIP ARTHROPLASTY;  Surgeon: Earlie Server, MD;  Location: [place];  Service: Orthopedics;  Laterality: Left;    MEDICATIONS:  ALPRAZolam (XANAX) 0.5 MG tablet   amLODipine (NORVASC) 5 MG tablet   carvedilol (COREG) 12.5 MG  tablet   cholecalciferol (VITAMIN D3) 25 MCG (1000 UT) tablet   clopidogrel (PLAVIX) 75 MG tablet   hydrochlorothiazide (HYDRODIURIL) 25 MG tablet   levothyroxine (SYNTHROID, LEVOTHROID) 100 MCG tablet   metFORMIN (GLUCOPHAGE) 500 MG tablet   Multiple Vitamins-Minerals (MULTIVITAMIN WITH MINERALS) tablet   olmesartan (BENICAR) 40 MG tablet   pyridOXINE (VITAMIN B-6) 100 MG tablet   rosuvastatin (CRESTOR) 20 MG tablet   spironolactone (ALDACTONE) 25 MG tablet   valACYclovir (VALTREX) 1000 MG tablet   vitamin B-12 (CYANOCOBALAMIN) 500 MCG tablet   No current facility-administered medications for this encounter.   - Pt to hold plavix 7 days before surgery   If no changes, I anticipate pt can proceed with surgery as scheduled.   Willeen Cass, PhD, FNP-BC [facility]/Anesthesiology Phone: [phone] 04/20/2021 12:36 PM

## 2021-04-20 NOTE — Anesthesia Preprocedure Evaluation (Addendum)
Anesthesia Evaluation  Patient identified by MRN, date of birth, ID band Patient awake    Reviewed: Allergy & Precautions, NPO status , Patient's Chart, lab work & pertinent test results  Airway Mallampati: II  TM Distance: >3 FB Neck ROM: Full    Dental   Pulmonary sleep apnea and Continuous Positive Airway Pressure Ventilation , former smoker,    breath sounds clear to auscultation       Cardiovascular hypertension, Pt. on medications and Pt. on home beta blockers  Rhythm:Regular Rate:Normal     Neuro/Psych TIA   GI/Hepatic negative GI ROS, Neg liver ROS,   Endo/Other  Hypothyroidism   Renal/GU negative Renal ROS     Musculoskeletal   Abdominal   Peds  Hematology  (+) anemia , Last plavix 9/8   Anesthesia Other Findings   Reproductive/Obstetrics                          CV: Exercise myoview stress test 04/23/19:  - Resting EKG NSR. Stress EKG is negative for ischemia.  - There were occasional PVCs and paroxysmal episodes of brief 3-8 beat atrial tachycardia during stress testing and into recovery.  - Patient exercised for a total of 6:13 min., achieving approximately 7 METs. Normal BP response.  - Exercise was terminated due to fatigue/weakness and achieving THR. - Normal myocardial perfusion. All segments of left ventricle demonstrated normal wall motion and thickening.  - Stress LV EF is hyperdynamic 75%.   -No previous exam available for comparison. Low risk study.   Echo 03/03/19:  - Normal LV systolic function with EF 55%. Left ventricle cavity is normal in size. Normal left ventricular wall thickness. Normal global wall motion. Indeterminate diastolic filling pattern.  - Mild tricuspid regurgitation. Estimated pulmonary artery systolic pressure is 26 mmHg. Anesthesia Physical Anesthesia Plan  ASA: 3  Anesthesia Plan: Spinal   Post-op Pain Management:    Induction:   PONV Risk  Score and Plan: 1 and Propofol infusion, Ondansetron and Treatment may vary due to age or medical condition  Airway Management Planned: Natural Airway and Simple Face Mask  Additional Equipment: None  Intra-op Plan:   Post-operative Plan:   Informed Consent: I have reviewed the patients History and Physical, chart, labs and discussed the procedure including the risks, benefits and alternatives for the proposed anesthesia with the patient or authorized representative who has indicated his/her understanding and acceptance.       Plan Discussed with: CRNA  Anesthesia Plan Comments:       Anesthesia Quick Evaluation

## 2021-04-22 ENCOUNTER — Other Ambulatory Visit: Payer: Self-pay

## 2021-04-22 DIAGNOSIS — Z1159 Encounter for screening for other viral diseases: Secondary | ICD-10-CM | POA: Diagnosis not present

## 2021-04-23 MED ORDER — TRANEXAMIC ACID 1000 MG/10ML IV SOLN
2000.0000 mg | INTRAVENOUS | Status: DC
  Filled 2021-04-23: qty 20

## 2021-04-24 ENCOUNTER — Ambulatory Visit (HOSPITAL_COMMUNITY): Payer: Medicare HMO | Admitting: Emergency Medicine

## 2021-04-24 ENCOUNTER — Ambulatory Visit (HOSPITAL_COMMUNITY): Payer: Medicare HMO

## 2021-04-24 ENCOUNTER — Inpatient Hospital Stay (HOSPITAL_COMMUNITY)
Admission: RE | Admit: 2021-04-24 | Discharge: 2021-04-30 | DRG: 470 | Disposition: A | Discharge status: Home Health | Payer: Medicare HMO | Attending: Orthopedic Surgery | Admitting: Orthopedic Surgery

## 2021-04-24 ENCOUNTER — Encounter (HOSPITAL_COMMUNITY)
Admission: RE | Disposition: A | Discharge status: Home Health | Payer: Self-pay | Source: Home / Self Care | Attending: Orthopedic Surgery

## 2021-04-24 ENCOUNTER — Encounter (HOSPITAL_COMMUNITY): Payer: Self-pay | Admitting: Orthopedic Surgery

## 2021-04-24 ENCOUNTER — Other Ambulatory Visit: Payer: Self-pay

## 2021-04-24 ENCOUNTER — Ambulatory Visit (HOSPITAL_COMMUNITY): Payer: Medicare HMO | Admitting: Certified Registered Nurse Anesthetist

## 2021-04-24 DIAGNOSIS — M1611 Unilateral primary osteoarthritis, right hip: Principal | ICD-10-CM | POA: Diagnosis present

## 2021-04-24 DIAGNOSIS — Z7982 Long term (current) use of aspirin: Secondary | ICD-10-CM

## 2021-04-24 DIAGNOSIS — Z9181 History of falling: Secondary | ICD-10-CM

## 2021-04-24 DIAGNOSIS — I951 Orthostatic hypotension: Secondary | ICD-10-CM | POA: Diagnosis not present

## 2021-04-24 DIAGNOSIS — D62 Acute posthemorrhagic anemia: Secondary | ICD-10-CM

## 2021-04-24 DIAGNOSIS — Z87442 Personal history of urinary calculi: Secondary | ICD-10-CM

## 2021-04-24 DIAGNOSIS — Z96641 Presence of right artificial hip joint: Secondary | ICD-10-CM | POA: Diagnosis not present

## 2021-04-24 DIAGNOSIS — R7989 Other specified abnormal findings of blood chemistry: Secondary | ICD-10-CM

## 2021-04-24 DIAGNOSIS — Z9989 Dependence on other enabling machines and devices: Secondary | ICD-10-CM | POA: Diagnosis not present

## 2021-04-24 DIAGNOSIS — F419 Anxiety disorder, unspecified: Secondary | ICD-10-CM | POA: Diagnosis present

## 2021-04-24 DIAGNOSIS — G4733 Obstructive sleep apnea (adult) (pediatric): Secondary | ICD-10-CM | POA: Diagnosis present

## 2021-04-24 DIAGNOSIS — Z8673 Personal history of transient ischemic attack (TIA), and cerebral infarction without residual deficits: Secondary | ICD-10-CM

## 2021-04-24 DIAGNOSIS — I10 Essential (primary) hypertension: Secondary | ICD-10-CM | POA: Diagnosis not present

## 2021-04-24 DIAGNOSIS — Z20822 Contact with and (suspected) exposure to covid-19: Secondary | ICD-10-CM | POA: Diagnosis not present

## 2021-04-24 DIAGNOSIS — Z833 Family history of diabetes mellitus: Secondary | ICD-10-CM

## 2021-04-24 DIAGNOSIS — E785 Hyperlipidemia, unspecified: Secondary | ICD-10-CM | POA: Diagnosis present

## 2021-04-24 DIAGNOSIS — E119 Type 2 diabetes mellitus without complications: Secondary | ICD-10-CM | POA: Diagnosis present

## 2021-04-24 DIAGNOSIS — Z79890 Hormone replacement therapy (postmenopausal): Secondary | ICD-10-CM

## 2021-04-24 DIAGNOSIS — E039 Hypothyroidism, unspecified: Secondary | ICD-10-CM | POA: Diagnosis not present

## 2021-04-24 DIAGNOSIS — Z7902 Long term (current) use of antithrombotics/antiplatelets: Secondary | ICD-10-CM

## 2021-04-24 DIAGNOSIS — G8929 Other chronic pain: Secondary | ICD-10-CM | POA: Diagnosis present

## 2021-04-24 DIAGNOSIS — Z7901 Long term (current) use of anticoagulants: Secondary | ICD-10-CM

## 2021-04-24 DIAGNOSIS — Z8249 Family history of ischemic heart disease and other diseases of the circulatory system: Secondary | ICD-10-CM

## 2021-04-24 DIAGNOSIS — I4892 Unspecified atrial flutter: Secondary | ICD-10-CM | POA: Diagnosis not present

## 2021-04-24 DIAGNOSIS — L719 Rosacea, unspecified: Secondary | ICD-10-CM | POA: Diagnosis present

## 2021-04-24 DIAGNOSIS — Z87891 Personal history of nicotine dependence: Secondary | ICD-10-CM

## 2021-04-24 DIAGNOSIS — Z823 Family history of stroke: Secondary | ICD-10-CM

## 2021-04-24 DIAGNOSIS — Z7984 Long term (current) use of oral hypoglycemic drugs: Secondary | ICD-10-CM

## 2021-04-24 DIAGNOSIS — Z471 Aftercare following joint replacement surgery: Secondary | ICD-10-CM | POA: Diagnosis not present

## 2021-04-24 DIAGNOSIS — R69 Illness, unspecified: Secondary | ICD-10-CM | POA: Diagnosis not present

## 2021-04-24 DIAGNOSIS — Z79899 Other long term (current) drug therapy: Secondary | ICD-10-CM

## 2021-04-24 HISTORY — PX: TOTAL HIP ARTHROPLASTY: SHX124

## 2021-04-24 LAB — SARS CORONAVIRUS 2 BY RT PCR (HOSPITAL ORDER, PERFORMED IN ~~LOC~~ HOSPITAL LAB): SARS Coronavirus 2: NEGATIVE

## 2021-04-24 LAB — GLUCOSE, CAPILLARY
Glucose-Capillary: 121 mg/dL — ABNORMAL HIGH (ref 70–99)
Glucose-Capillary: 113 mg/dL — ABNORMAL HIGH (ref 70–99)
Glucose-Capillary: 161 mg/dL — ABNORMAL HIGH (ref 70–99)
Glucose-Capillary: 148 mg/dL — ABNORMAL HIGH (ref 70–99)

## 2021-04-24 SURGERY — ARTHROPLASTY, HIP, TOTAL,POSTERIOR APPROACH
Anesthesia: Spinal | Site: Hip | Laterality: Right

## 2021-04-24 MED ORDER — SODIUM CHLORIDE (PF) 0.9 % IJ SOLN
INTRAMUSCULAR | Status: AC
  Filled 2021-04-24: qty 20

## 2021-04-24 MED ORDER — MIDAZOLAM HCL 2 MG/2ML IJ SOLN
INTRAMUSCULAR | Status: AC
  Filled 2021-04-24: qty 2

## 2021-04-24 MED ORDER — ALBUMIN HUMAN 5 % IV SOLN
INTRAVENOUS | Status: AC
  Filled 2021-04-24: qty 250

## 2021-04-24 MED ORDER — PROPOFOL 10 MG/ML IV BOLUS
INTRAVENOUS | Status: DC | PRN
  Administered 2021-04-24: 10 mg via INTRAVENOUS

## 2021-04-24 MED ORDER — BUPIVACAINE-EPINEPHRINE 0.25% -1:200000 IJ SOLN
INTRAMUSCULAR | Status: DC | PRN
  Administered 2021-04-24: 20 mL

## 2021-04-24 MED ORDER — BUPIVACAINE LIPOSOME 1.3 % IJ SUSP
INTRAMUSCULAR | Status: AC
  Filled 2021-04-24: qty 10

## 2021-04-24 MED ORDER — BUPIVACAINE-EPINEPHRINE (PF) 0.25% -1:200000 IJ SOLN
INTRAMUSCULAR | Status: AC
  Filled 2021-04-24: qty 30

## 2021-04-24 MED ORDER — ACETAMINOPHEN 10 MG/ML IV SOLN
INTRAVENOUS | Status: DC | PRN
  Administered 2021-04-24: 1000 mg via INTRAVENOUS

## 2021-04-24 MED ORDER — DOCUSATE SODIUM 100 MG PO CAPS
100.0000 mg | ORAL_CAPSULE | Freq: Two times a day (BID) | ORAL | Status: DC
  Administered 2021-04-24 – 2021-04-30 (×12): 100 mg via ORAL
  Filled 2021-04-24 (×12): qty 1

## 2021-04-24 MED ORDER — PROPOFOL 1000 MG/100ML IV EMUL
INTRAVENOUS | Status: AC
  Filled 2021-04-24: qty 100

## 2021-04-24 MED ORDER — MIDAZOLAM HCL 5 MG/5ML IJ SOLN
INTRAMUSCULAR | Status: DC | PRN
Start: 2021-04-24 — End: 2021-04-24
  Administered 2021-04-24: 1 mg via INTRAVENOUS

## 2021-04-24 MED ORDER — ACETAMINOPHEN 325 MG PO TABS: 650.0000 mg | ORAL_TABLET | ORAL | 2 refills | Status: AC | PRN

## 2021-04-24 MED ORDER — INSULIN ASPART 100 UNIT/ML IJ SOLN
0.0000 [IU] | Freq: Three times a day (TID) | INTRAMUSCULAR | Status: DC
  Administered 2021-04-24: 3 [IU] via SUBCUTANEOUS
  Administered 2021-04-25 – 2021-04-28 (×7): 2 [IU] via SUBCUTANEOUS
  Administered 2021-04-28: 3 [IU] via SUBCUTANEOUS
  Administered 2021-04-29 (×2): 2 [IU] via SUBCUTANEOUS
  Administered 2021-04-29: 1 [IU] via SUBCUTANEOUS
  Administered 2021-04-30: 2 [IU] via SUBCUTANEOUS

## 2021-04-24 MED ORDER — LACTATED RINGERS IV BOLUS
250.0000 mL | Freq: Once | INTRAVENOUS | Status: AC
  Administered 2021-04-24: 250 mL via INTRAVENOUS

## 2021-04-24 MED ORDER — CEFAZOLIN SODIUM-DEXTROSE 2-4 GM/100ML-% IV SOLN
2.0000 g | INTRAVENOUS | Status: AC
  Administered 2021-04-24: 2 g via INTRAVENOUS
  Filled 2021-04-24: qty 100

## 2021-04-24 MED ORDER — POLYETHYLENE GLYCOL 3350 17 G PO PACK
17.0000 g | PACK | Freq: Every day | ORAL | Status: DC | PRN
  Administered 2021-04-27 – 2021-04-30 (×4): 17 g via ORAL
  Filled 2021-04-24 (×4): qty 1

## 2021-04-24 MED ORDER — ALPRAZOLAM 0.5 MG PO TABS: 0.5000 mg | ORAL_TABLET | Freq: Every evening | ORAL | Status: DC | PRN

## 2021-04-24 MED ORDER — SODIUM CHLORIDE 0.9 % IR SOLN: Status: DC | PRN

## 2021-04-24 MED ORDER — DEXAMETHASONE SODIUM PHOSPHATE 10 MG/ML IJ SOLN
INTRAMUSCULAR | Status: AC
  Filled 2021-04-24: qty 1

## 2021-04-24 MED ORDER — TRANEXAMIC ACID 1000 MG/10ML IV SOLN
INTRAVENOUS | Status: DC | PRN
  Administered 2021-04-24: 2000 mg via TOPICAL

## 2021-04-24 MED ORDER — LEVOTHYROXINE SODIUM 100 MCG PO TABS
100.0000 ug | ORAL_TABLET | Freq: Every day | ORAL | Status: DC
  Administered 2021-04-25 – 2021-04-30 (×6): 100 ug via ORAL
  Filled 2021-04-24 (×6): qty 1

## 2021-04-24 MED ORDER — FENTANYL CITRATE (PF) 100 MCG/2ML IJ SOLN
INTRAMUSCULAR | Status: DC | PRN
  Administered 2021-04-24: 50 ug via INTRAVENOUS

## 2021-04-24 MED ORDER — ACETAMINOPHEN 325 MG PO TABS
325.0000 mg | ORAL_TABLET | Freq: Four times a day (QID) | ORAL | Status: DC | PRN
  Filled 2021-04-24: qty 2

## 2021-04-24 MED ORDER — BUPIVACAINE HCL (PF) 0.5 % IJ SOLN
INTRAMUSCULAR | Status: DC | PRN
  Administered 2021-04-24: 3 mL via INTRATHECAL

## 2021-04-24 MED ORDER — ACETAMINOPHEN 10 MG/ML IV SOLN
INTRAVENOUS | Status: AC
  Filled 2021-04-24: qty 100

## 2021-04-24 MED ORDER — ACETAMINOPHEN 500 MG PO TABS
1000.0000 mg | ORAL_TABLET | Freq: Four times a day (QID) | ORAL | Status: AC
  Administered 2021-04-24 – 2021-04-25 (×4): 1000 mg via ORAL
  Filled 2021-04-24 (×4): qty 2

## 2021-04-24 MED ORDER — AMISULPRIDE (ANTIEMETIC) 5 MG/2ML IV SOLN: 10.0000 mg | Freq: Once | INTRAVENOUS | Status: DC | PRN

## 2021-04-24 MED ORDER — SORBITOL 70 % SOLN
30.0000 mL | Freq: Every day | Status: DC | PRN
  Filled 2021-04-24: qty 30

## 2021-04-24 MED ORDER — SPIRONOLACTONE 25 MG PO TABS
25.0000 mg | ORAL_TABLET | Freq: Every day | ORAL | Status: DC
  Administered 2021-04-25: 25 mg via ORAL
  Filled 2021-04-24 (×5): qty 1

## 2021-04-24 MED ORDER — ASPIRIN 81 MG PO CHEW
81.0000 mg | CHEWABLE_TABLET | Freq: Two times a day (BID) | ORAL | Status: DC
  Administered 2021-04-24 – 2021-04-29 (×9): 81 mg via ORAL
  Filled 2021-04-24 (×10): qty 1

## 2021-04-24 MED ORDER — LACTATED RINGERS IV SOLN: INTRAVENOUS | Status: DC | PRN

## 2021-04-24 MED ORDER — PROPOFOL 500 MG/50ML IV EMUL
INTRAVENOUS | Status: DC | PRN
  Administered 2021-04-24: 75 ug/kg/min via INTRAVENOUS

## 2021-04-24 MED ORDER — WATER FOR IRRIGATION, STERILE IR SOLN
Status: DC | PRN
  Administered 2021-04-24: 2000 mL

## 2021-04-24 MED ORDER — ALBUMIN HUMAN 5 % IV SOLN: INTRAVENOUS | Status: DC | PRN

## 2021-04-24 MED ORDER — PHENYLEPHRINE HCL-NACL 20-0.9 MG/250ML-% IV SOLN
INTRAVENOUS | Status: DC | PRN
  Administered 2021-04-24: 25 ug/min via INTRAVENOUS

## 2021-04-24 MED ORDER — BUPIVACAINE LIPOSOME 1.3 % IJ SUSP
INTRAMUSCULAR | Status: DC | PRN
  Administered 2021-04-24: 10 mL

## 2021-04-24 MED ORDER — AMLODIPINE BESYLATE 5 MG PO TABS
5.0000 mg | ORAL_TABLET | Freq: Two times a day (BID) | ORAL | Status: DC
  Administered 2021-04-24: 5 mg via ORAL
  Filled 2021-04-24 (×3): qty 1

## 2021-04-24 MED ORDER — PHENYLEPHRINE 40 MCG/ML (10ML) SYRINGE FOR IV PUSH (FOR BLOOD PRESSURE SUPPORT)
PREFILLED_SYRINGE | INTRAVENOUS | Status: DC | PRN
  Administered 2021-04-24 (×2): 120 ug via INTRAVENOUS

## 2021-04-24 MED ORDER — SODIUM CHLORIDE (PF) 0.9 % IJ SOLN
INTRAMUSCULAR | Status: DC | PRN
  Administered 2021-04-24: 20 mL

## 2021-04-24 MED ORDER — POVIDONE-IODINE 10 % EX SWAB
2.0000 "application " | Freq: Once | CUTANEOUS | Status: AC
  Administered 2021-04-24: 2 via TOPICAL

## 2021-04-24 MED ORDER — LACTATED RINGERS IV BOLUS: 500.0000 mL | Freq: Once | INTRAVENOUS | Status: DC

## 2021-04-24 MED ORDER — FENTANYL CITRATE (PF) 100 MCG/2ML IJ SOLN
INTRAMUSCULAR | Status: AC
  Filled 2021-04-24: qty 2

## 2021-04-24 MED ORDER — DIPHENHYDRAMINE HCL 12.5 MG/5ML PO ELIX: 12.5000 mg | ORAL_SOLUTION | ORAL | Status: DC | PRN

## 2021-04-24 MED ORDER — FENTANYL CITRATE PF 50 MCG/ML IJ SOSY: 25.0000 ug | PREFILLED_SYRINGE | INTRAMUSCULAR | Status: DC | PRN

## 2021-04-24 MED ORDER — TRANEXAMIC ACID-NACL 1000-0.7 MG/100ML-% IV SOLN
1000.0000 mg | Freq: Once | INTRAVENOUS | Status: AC
  Administered 2021-04-24: 1000 mg via INTRAVENOUS
  Filled 2021-04-24: qty 100

## 2021-04-24 MED ORDER — HYDROCHLOROTHIAZIDE 25 MG PO TABS
25.0000 mg | ORAL_TABLET | Freq: Every day | ORAL | Status: DC
  Administered 2021-04-25: 25 mg via ORAL
  Filled 2021-04-24: qty 1

## 2021-04-24 MED ORDER — CLOPIDOGREL BISULFATE 75 MG PO TABS
75.0000 mg | ORAL_TABLET | Freq: Every day | ORAL | Status: DC
  Administered 2021-04-25 – 2021-04-29 (×5): 75 mg via ORAL
  Filled 2021-04-24 (×5): qty 1

## 2021-04-24 MED ORDER — TRANEXAMIC ACID-NACL 1000-0.7 MG/100ML-% IV SOLN
1000.0000 mg | INTRAVENOUS | Status: AC
  Administered 2021-04-24: 1000 mg via INTRAVENOUS
  Filled 2021-04-24: qty 100

## 2021-04-24 MED ORDER — OXYCODONE HCL 5 MG PO TABS
5.0000 mg | ORAL_TABLET | ORAL | Status: DC | PRN
  Administered 2021-04-24: 10 mg via ORAL
  Administered 2021-04-24: 5 mg via ORAL
  Administered 2021-04-24: 10 mg via ORAL
  Administered 2021-04-24: 5 mg via ORAL
  Administered 2021-04-25 – 2021-04-30 (×30): 10 mg via ORAL
  Filled 2021-04-24 (×4): qty 2
  Filled 2021-04-24: qty 1
  Filled 2021-04-24 (×13): qty 2
  Filled 2021-04-24: qty 1
  Filled 2021-04-24 (×16): qty 2

## 2021-04-24 MED ORDER — ROSUVASTATIN CALCIUM 20 MG PO TABS
20.0000 mg | ORAL_TABLET | Freq: Every day | ORAL | Status: DC
  Administered 2021-04-24 – 2021-04-29 (×6): 20 mg via ORAL
  Filled 2021-04-24 (×6): qty 1

## 2021-04-24 MED ORDER — ONDANSETRON HCL 4 MG PO TABS: 4.0000 mg | ORAL_TABLET | Freq: Four times a day (QID) | ORAL | Status: DC | PRN

## 2021-04-24 MED ORDER — ONDANSETRON HCL 4 MG/2ML IJ SOLN
INTRAMUSCULAR | Status: DC | PRN
  Administered 2021-04-24: 4 mg via INTRAVENOUS

## 2021-04-24 MED ORDER — METOCLOPRAMIDE HCL 5 MG/ML IJ SOLN: 5.0000 mg | Freq: Three times a day (TID) | INTRAMUSCULAR | Status: DC | PRN

## 2021-04-24 MED ORDER — PHENYLEPHRINE HCL-NACL 20-0.9 MG/250ML-% IV SOLN
INTRAVENOUS | Status: AC
  Filled 2021-04-24: qty 250

## 2021-04-24 MED ORDER — HYDROMORPHONE HCL 1 MG/ML IJ SOLN
0.5000 mg | INTRAMUSCULAR | Status: DC | PRN
  Administered 2021-04-24 (×2): 1 mg via INTRAVENOUS
  Filled 2021-04-24 (×2): qty 1

## 2021-04-24 MED ORDER — EPHEDRINE SULFATE-NACL 50-0.9 MG/10ML-% IV SOSY
PREFILLED_SYRINGE | INTRAVENOUS | Status: DC | PRN
  Administered 2021-04-24 (×4): 10 mg via INTRAVENOUS

## 2021-04-24 MED ORDER — PROPOFOL 500 MG/50ML IV EMUL
INTRAVENOUS | Status: AC
  Filled 2021-04-24: qty 50

## 2021-04-24 MED ORDER — PHENOL 1.4 % MT LIQD: 1.0000 | OROMUCOSAL | Status: DC | PRN

## 2021-04-24 MED ORDER — FLEET ENEMA 7-19 GM/118ML RE ENEM: 1.0000 | ENEMA | Freq: Once | RECTAL | Status: DC | PRN

## 2021-04-24 MED ORDER — PROPOFOL 10 MG/ML IV BOLUS
INTRAVENOUS | Status: AC
  Filled 2021-04-24: qty 40

## 2021-04-24 MED ORDER — BUPIVACAINE LIPOSOME 1.3 % IJ SUSP: 10.0000 mL | Freq: Once | INTRAMUSCULAR | Status: DC

## 2021-04-24 MED ORDER — EPHEDRINE 5 MG/ML INJ
INTRAVENOUS | Status: AC
  Filled 2021-04-24: qty 10

## 2021-04-24 MED ORDER — ONDANSETRON HCL 4 MG/2ML IJ SOLN: 4.0000 mg | Freq: Four times a day (QID) | INTRAMUSCULAR | Status: DC | PRN

## 2021-04-24 MED ORDER — MENTHOL 3 MG MT LOZG: 1.0000 | LOZENGE | OROMUCOSAL | Status: DC | PRN

## 2021-04-24 MED ORDER — OXYCODONE HCL 5 MG PO TABS: ORAL_TABLET | ORAL | 0 refills | Status: DC

## 2021-04-24 MED ORDER — CARVEDILOL 12.5 MG PO TABS
12.5000 mg | ORAL_TABLET | Freq: Two times a day (BID) | ORAL | Status: DC
  Administered 2021-04-24 – 2021-04-25 (×2): 12.5 mg via ORAL
  Filled 2021-04-24 (×3): qty 1

## 2021-04-24 MED ORDER — PHENYLEPHRINE 40 MCG/ML (10ML) SYRINGE FOR IV PUSH (FOR BLOOD PRESSURE SUPPORT)
PREFILLED_SYRINGE | INTRAVENOUS | Status: AC
  Filled 2021-04-24: qty 10

## 2021-04-24 MED ORDER — ASPIRIN EC 81 MG PO TBEC: 81.0000 mg | DELAYED_RELEASE_TABLET | Freq: Two times a day (BID) | ORAL | 0 refills | Status: DC

## 2021-04-24 MED ORDER — INSULIN ASPART 100 UNIT/ML IJ SOLN: 0.0000 [IU] | Freq: Every day | INTRAMUSCULAR | Status: DC

## 2021-04-24 MED ORDER — 0.9 % SODIUM CHLORIDE (POUR BTL) OPTIME
TOPICAL | Status: DC | PRN
  Administered 2021-04-24: 1000 mL

## 2021-04-24 MED ORDER — METOCLOPRAMIDE HCL 5 MG PO TABS: 5.0000 mg | ORAL_TABLET | Freq: Three times a day (TID) | ORAL | Status: DC | PRN

## 2021-04-24 MED ORDER — ONDANSETRON HCL 4 MG/2ML IJ SOLN
INTRAMUSCULAR | Status: AC
  Filled 2021-04-24: qty 2

## 2021-04-24 MED ORDER — TIZANIDINE HCL 4 MG PO TABS: 4.0000 mg | ORAL_TABLET | Freq: Three times a day (TID) | ORAL | 1 refills | Status: DC | PRN

## 2021-04-24 MED ORDER — SODIUM CHLORIDE 0.9 % IV SOLN: INTRAVENOUS | Status: DC

## 2021-04-24 MED ORDER — INSULIN ASPART 100 UNIT/ML IJ SOLN
4.0000 [IU] | Freq: Three times a day (TID) | INTRAMUSCULAR | Status: DC
  Administered 2021-04-25 – 2021-04-30 (×15): 4 [IU] via SUBCUTANEOUS

## 2021-04-24 SURGICAL SUPPLY — 55 items
BAG COUNTER SPONGE SURGICOUNT (BAG) ×2 IMPLANT
BAG DECANTER FOR FLEXI CONT (MISCELLANEOUS) ×2 IMPLANT
BAG ZIPLOCK 12X15 (MISCELLANEOUS) ×2 IMPLANT
BENZOIN TINCTURE PRP APPL 2/3 (GAUZE/BANDAGES/DRESSINGS) ×2 IMPLANT
BLADE SAW SAG 73X25 THK (BLADE) ×1
BLADE SAW SGTL 73X25 THK (BLADE) ×1 IMPLANT
CLSR STERI-STRIP ANTIMIC 1/2X4 (GAUZE/BANDAGES/DRESSINGS) ×4 IMPLANT
COVER SURGICAL LIGHT HANDLE (MISCELLANEOUS) ×2 IMPLANT
DECANTER SPIKE VIAL GLASS SM (MISCELLANEOUS) ×6 IMPLANT
DRAPE 3/4 80X56 (DRAPES) ×2 IMPLANT
DRAPE INCISE IOBAN 66X45 STRL (DRAPES) ×2 IMPLANT
DRAPE INCISE IOBAN 85X60 (DRAPES) ×2 IMPLANT
DRAPE ORTHO SPLIT 77X108 STRL (DRAPES) ×4
DRAPE POUCH INSTRU U-SHP 10X18 (DRAPES) ×2 IMPLANT
DRAPE SURG ORHT 6 SPLT 77X108 (DRAPES) ×2 IMPLANT
DRAPE U-SHAPE 47X51 STRL (DRAPES) ×2 IMPLANT
DRESSING AQUACEL AG SP 3.5X10 (GAUZE/BANDAGES/DRESSINGS) ×1 IMPLANT
DRSG AQUACEL AG SP 3.5X10 (GAUZE/BANDAGES/DRESSINGS) ×2
DURAPREP 26ML APPLICATOR (WOUND CARE) ×2 IMPLANT
ELECT BLADE TIP CTD 4 INCH (ELECTRODE) ×2 IMPLANT
ELECT REM PT RETURN 15FT ADLT (MISCELLANEOUS) ×2 IMPLANT
FACESHIELD WRAPAROUND (MASK) ×6 IMPLANT
GLOVE SRG 8 PF TXTR STRL LF DI (GLOVE) ×2 IMPLANT
GLOVE SURG ORTHO LTX SZ8 (GLOVE) ×2 IMPLANT
GLOVE SURG POLYISO LF SZ7.5 (GLOVE) ×2 IMPLANT
GLOVE SURG UNDER POLY LF SZ8 (GLOVE) ×4
GOWN STRL REUS W/TWL XL LVL3 (GOWN DISPOSABLE) ×4 IMPLANT
HEAD CERAMIC 36 PLUS5 (Hips) ×2 IMPLANT
HOLDER FOLEY CATH W/STRAP (MISCELLANEOUS) ×2 IMPLANT
HOOD PEEL AWAY FLYTE STAYCOOL (MISCELLANEOUS) ×6 IMPLANT
IMMOBILIZER KNEE 20 (SOFTGOODS) ×2
IMMOBILIZER KNEE 20 THIGH 36 (SOFTGOODS) ×1 IMPLANT
KIT BASIN OR (CUSTOM PROCEDURE TRAY) ×2 IMPLANT
KIT TURNOVER KIT A (KITS) ×2 IMPLANT
LINER NEUTRAL 52X36X52 PLUS 4 (Liner) ×2 IMPLANT
MANIFOLD NEPTUNE II (INSTRUMENTS) ×2 IMPLANT
NEEDLE HYPO 22GX1.5 SAFETY (NEEDLE) ×4 IMPLANT
NS IRRIG 1000ML POUR BTL (IV SOLUTION) IMPLANT
PACK TOTAL JOINT (CUSTOM PROCEDURE TRAY) ×2 IMPLANT
PENCIL SMOKE EVACUATOR (MISCELLANEOUS) ×2 IMPLANT
PIN SECTOR W/GRIP ACE CUP 52MM (Hips) ×2 IMPLANT
PROTECTOR NERVE ULNAR (MISCELLANEOUS) ×4 IMPLANT
STAPLER VISISTAT 35W (STAPLE) IMPLANT
STEM FEMORAL SZ5 HIGH ACTIS (Stem) ×2 IMPLANT
SUCTION FRAZIER HANDLE 12FR (TUBING) ×1
SUCTION TUBE FRAZIER 12FR DISP (TUBING) ×1 IMPLANT
SUT ETHIBOND NAB CT1 #1 30IN (SUTURE) ×8 IMPLANT
SUT MNCRL AB 3-0 PS2 18 (SUTURE) ×2 IMPLANT
SUT VIC AB 0 CT1 36 (SUTURE) ×4 IMPLANT
SUT VIC AB 2-0 CT1 27 (SUTURE) ×4
SUT VIC AB 2-0 CT1 TAPERPNT 27 (SUTURE) ×2 IMPLANT
SYR CONTROL 10ML LL (SYRINGE) ×4 IMPLANT
TOWEL OR 17X26 10 PK STRL BLUE (TOWEL DISPOSABLE) ×2 IMPLANT
TRAY FOLEY MTR SLVR 16FR STAT (SET/KITS/TRAYS/PACK) ×2 IMPLANT
WATER STERILE IRR 1000ML POUR (IV SOLUTION) IMPLANT

## 2021-04-24 NOTE — TOC Progression Note (Signed)
Transition of Care (TOC) - Progression Note    Patient Details  Name: Norman Hudson. MRN: [id number] Date of Birth: December 02, 1950  Transition of Care [facility]) CM/SW Contact  Lennart Pall, LCSW Phone Number: 04/24/2021, 1:20 PM  Clinical Narrative:     Met with pt to review possible dc needs.  Pt confirms that he already has a rw and 3n1 commode at home.  Notes that he has been set up for OPPT beginning on Monday, however, he and wife would prefer HHPT to start.  Will alert MD/ PA and IF plan changes to [place] can assist with making referral.    Barriers to Discharge: Continued Medical Work up  Expected Discharge Plan and Services           Expected Discharge Date: 04/25/21               DME Arranged: N/A DME Agency: NA                   Social Determinants of Health ([place]) Interventions    Readmission Risk Interventions No flowsheet data found.

## 2021-04-24 NOTE — Evaluation (Signed)
Physical Therapy Evaluation Patient Details Name: Norman Hudson. 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  Clinical Impression  Pt s/p R THR and presents with decreased R LE strength/ROM, post op pain, premorbid deconditioning and posterior THP limiting functional mobility. Pt should progress to dc home with family assist and would greatly benefit from follow up HHPT to maximize safety and IND in home setting.     Recommendations for follow up therapy are one component of a multi-disciplinary discharge planning process, led by the attending physician.  Recommendations may be updated based on patient status, additional functional criteria and insurance authorization.  Follow Up Recommendations Home health PT    Equipment Recommendations  None recommended by PT    Recommendations for Other Services OT consult     Precautions / Restrictions Precautions Precautions: Posterior Hip;Fall Precaution Booklet Issued: Yes (comment) Required Braces or Orthoses: Knee Immobilizer - Right Knee Immobilizer - Right: Other (comment) (on in bed) Restrictions Weight Bearing Restrictions: No RLE Weight Bearing: Weight bearing as tolerated      Mobility  Bed Mobility Overal bed mobility: Needs Assistance Bed Mobility: Supine to Sit;Sit to Supine     Supine to sit: Mod assist;+2 for physical assistance;+2 for safety/equipment;HOB elevated Sit to supine: Max assist;+2 for physical assistance;+2 for safety/equipment   General bed mobility comments: Increased time with cues for sequence, use of L LE to self assist and adherence to THP.  Physical assist to control trunk, to manage LEs and to complete rotation to/from EOB utilizing bed pad.    Transfers Overall transfer level: Needs assistance Equipment used: Rolling walker (2 wheeled) Transfers: Sit  to/from Stand Sit to Stand: Mod assist;+2 physical assistance;From elevated surface;+2 safety/equipment         General transfer comment: cues for LE management, adherence to THP, and use of UEs to self assist.  Physical assist to bring wt up and fwd and to balance in standing with RW  Ambulation/Gait Ambulation/Gait assistance: Min assist;+2 physical assistance;+2 safety/equipment Gait Distance (Feet): 3 Feet Assistive device: Rolling walker (2 wheeled) Gait Pattern/deviations: Step-to pattern;Decreased step length - right;Decreased step length - left;Shuffle;Trunk flexed Gait velocity: decr   General Gait Details: Pt side-stepped up side of bed only.  Increased time with cues for posture and sequence.  Stairs            Wheelchair Mobility    Modified Rankin (Stroke Patients Only)       Balance Overall balance assessment: Needs assistance Sitting-balance support: No upper extremity supported;Feet supported Sitting balance-Leahy Scale: Fair     Standing balance support: Bilateral upper extremity supported Standing balance-Leahy Scale: Poor                               Pertinent Vitals/Pain Pain Assessment: 0-10 Pain Score: 8  Pain Location: R hip Pain Descriptors / Indicators: Aching;Guarding;Grimacing;Sore Pain Intervention(s): Limited activity within patient's tolerance;Monitored during session;Premedicated before session;Patient requesting pain meds-RN notified;RN gave pain meds during session;Ice applied    Home Living Family/patient expects to be discharged to:: Private residence Living Arrangements: Spouse/significant other Available Help at Discharge: Family Type of Home: House Home Access: Stairs to enter   Secretary/administrator of Steps: 1 Home Layout: One level Home Equipment: Environmental consultant - 2 wheels;Cane - single point  Prior Function Level of Independence: Independent with assistive device(s)         Comments: using cane for  balance/support     Hand Dominance   Dominant Hand: Right    Extremity/Trunk Assessment   Upper Extremity Assessment Upper Extremity Assessment: Generalized weakness    Lower Extremity Assessment Lower Extremity Assessment: RLE deficits/detail;Generalized weakness    Cervical / Trunk Assessment Cervical / Trunk Assessment: Kyphotic  Communication   Communication: HOH  Cognition Arousal/Alertness: Awake/alert Behavior During Therapy: WFL for tasks assessed/performed                                          General Comments      Exercises Total Joint Exercises Ankle Circles/Pumps: AROM;15 reps;Supine;Both   Assessment/Plan    PT Assessment Patient needs continued PT services  PT Problem List Decreased strength;Decreased range of motion;Decreased activity tolerance;Decreased balance;Decreased mobility;Decreased knowledge of use of DME;Pain;Decreased knowledge of precautions       PT Treatment Interventions DME instruction;Gait training;Stair training;Functional mobility training;Therapeutic activities;Therapeutic exercise;Patient/family education    PT Goals (Current goals can be found in the Care Plan section)  Acute Rehab PT Goals Patient Stated Goal: Regain IND PT Goal Formulation: With patient Time For Goal Achievement: 05/08/21 Potential to Achieve Goals: Good    Frequency 7X/week   Barriers to discharge        Co-evaluation               AM-PAC PT "6 Clicks" Mobility  Outcome Measure Help needed turning from your back to your side while in a flat bed without using bedrails?: A Lot Help needed moving from lying on your back to sitting on the side of a flat bed without using bedrails?: A Lot Help needed moving to and from a bed to a chair (including a wheelchair)?: A Lot Help needed standing up from a chair using your arms (e.g., wheelchair or bedside chair)?: A Lot Help needed to walk in hospital room?: A Lot Help needed  climbing 3-5 steps with a railing? : Total 6 Click Score: 11    End of Session Equipment Utilized During Treatment: Gait belt Activity Tolerance: Patient limited by fatigue;Patient limited by pain Patient left: in bed;with call bell/phone within reach;with bed alarm set Nurse Communication: Mobility status PT Visit Diagnosis: Unsteadiness on feet (R26.81);Difficulty in walking, not elsewhere classified (R26.2);Muscle weakness (generalized) (M62.81)    Time: 1532-1600 PT Time Calculation (min) (ACUTE ONLY): 28 min   Charges:   PT Evaluation $PT Eval Low Complexity: 1 Low PT Treatments $Therapeutic Activity: 8-22 mins        Mauro Kaufmann PT Acute Rehabilitation Services Pager [phone] Office [phone]   Saleh Ulbrich 04/24/2021, 4:18 PM

## 2021-04-24 NOTE — Transfer of Care (Signed)
Immediate Anesthesia Transfer of Care Note  Patient: Norman Hudson.  Procedure(s) Performed: TOTAL HIP ARTHROPLASTY (Right: Hip)  Patient Location: PACU  Anesthesia Type:MAC and Spinal  Level of Consciousness: awake, alert  and patient cooperative  Airway & Oxygen Therapy: Patient Spontanous Breathing and Patient connected to face mask oxygen  Post-op Assessment: Report given to RN and Post -op Vital signs reviewed and stable  Post vital signs: Reviewed and stable  Last Vitals:  Vitals Value Taken Time  BP 88/54 04/24/21 1003  Temp    Pulse 70 04/24/21 1008  Resp 14 04/24/21 1008  SpO2 97 % 04/24/21 1008  Vitals shown include unvalidated device data.  Last Pain:  Vitals:   04/24/21 0603  TempSrc: Oral  PainSc:          Complications: No notable events documented.

## 2021-04-24 NOTE — Anesthesia Procedure Notes (Signed)
Procedure Name: MAC Date/Time: 04/24/2021 7:42 AM Performed by: West Pugh, CRNA Pre-anesthesia Checklist: Patient identified, Emergency Drugs available, Suction available, Patient being monitored and Timeout performed Patient Re-evaluated:Patient Re-evaluated prior to induction Oxygen Delivery Method: Simple face mask Preoxygenation: Pre-oxygenation with 100% oxygen Induction Type: IV induction Placement Confirmation: positive ETCO2 Dental Injury: Teeth and Oropharynx as per pre-operative assessment

## 2021-04-24 NOTE — Progress Notes (Signed)
Patient refuses nocturnal CPAP tonight. Wife at bedside, aware and agrees with patient decision. RN made aware.

## 2021-04-24 NOTE — Discharge Instructions (Addendum)
INSTRUCTIONS AFTER JOINT REPLACEMENT   Remove items at home which could result in a fall. This includes throw rugs or furniture in walking pathways ICE to the affected joint every three hours while awake for 30 minutes at a time, for at least the first 3-5 days, and then as needed for pain and swelling.  Continue to use ice for pain and swelling. You may notice swelling that will progress down to the foot and ankle.  This is normal after surgery.  Elevate your leg when you are not up walking on it.   Continue to use the breathing machine you got in the hospital (incentive spirometer) which will help keep your temperature down.  It is common for your temperature to cycle up and down following surgery, especially at night when you are not up moving around and exerting yourself.  The breathing machine keeps your lungs expanded and your temperature down.   DIET:  As you were doing prior to hospitalization, we recommend a well-balanced diet.  DRESSING / WOUND CARE / SHOWERING  Keep the surgical dressing until follow up.  The dressing is water proof, so you can shower without any extra covering.  IF THE DRESSING FALLS OFF or the wound gets wet inside, change the dressing with sterile gauze.  Please use good hand washing techniques before changing the dressing.  Do not use any lotions or creams on the incision until instructed by your surgeon.    ACTIVITY  Increase activity slowly as tolerated, but follow the weight bearing instructions below.   No driving for 6 weeks or until further direction given by your physician.  You cannot drive while taking narcotics.  No lifting or carrying greater than 10 lbs. until further directed by your surgeon. Avoid periods of inactivity such as sitting longer than an hour when not asleep. This helps prevent blood clots.  You may return to work once you are authorized by your doctor.     WEIGHT BEARING   Weight bearing as tolerated with assist device (walker, cane,  etc) as directed, use it as long as suggested by your surgeon or therapist, typically at least 4-6 weeks.   EXERCISES  Results after joint replacement surgery are often greatly improved when you follow the exercise, range of motion and muscle strengthening exercises prescribed by your doctor. Safety measures are also important to protect the joint from further injury. Any time any of these exercises cause you to have increased pain or swelling, decrease what you are doing until you are comfortable again and then slowly increase them. If you have problems or questions, call your caregiver or physical therapist for advice.   Rehabilitation is important following a joint replacement. After just a few days of immobilization, the muscles of the leg can become weakened and shrink (atrophy).  These exercises are designed to build up the tone and strength of the thigh and leg muscles and to improve motion. Often times heat used for twenty to thirty minutes before working out will loosen up your tissues and help with improving the range of motion but do not use heat for the first two weeks following surgery (sometimes heat can increase post-operative swelling).   These exercises can be done on a training (exercise) mat, on the floor, on a table or on a bed. Use whatever works the best and is most comfortable for you.    Use music or television while you are exercising so that the exercises are a pleasant break in your   day. This will make your life better with the exercises acting as a break in your routine that you can look forward to.   Perform all exercises about fifteen times, three times per day or as directed.  You should exercise both the operative leg and the other leg as well.  Exercises include:   Quad Sets - Tighten up the muscle on the front of the thigh (Quad) and hold for 5-10 seconds.   Straight Leg Raises - With your knee straight (if you were given a brace, keep it on), lift the leg to 60  degrees, hold for 3 seconds, and slowly lower the leg.  Perform this exercise against resistance later as your leg gets stronger.  Leg Slides: Lying on your back, slowly slide your foot toward your buttocks, bending your knee up off the floor (only go as far as is comfortable). Then slowly slide your foot back down until your leg is flat on the floor again.  Angel Wings: Lying on your back spread your legs to the side as far apart as you can without causing discomfort.  Hamstring Strength:  Lying on your back, push your heel against the floor with your leg straight by tightening up the muscles of your buttocks.  Repeat, but this time bend your knee to a comfortable angle, and push your heel against the floor.  You may put a pillow under the heel to make it more comfortable if necessary.   A rehabilitation program following joint replacement surgery can speed recovery and prevent re-injury in the future due to weakened muscles. Contact your doctor or a physical therapist for more information on knee rehabilitation.    CONSTIPATION  Constipation is defined medically as fewer than three stools per week and severe constipation as less than one stool per week.  Even if you have a regular bowel pattern at home, your normal regimen is likely to be disrupted due to multiple reasons following surgery.  Combination of anesthesia, postoperative narcotics, change in appetite and fluid intake all can affect your bowels.   YOU MUST use at least one of the following options; they are listed in order of increasing strength to get the job done.  They are all available over the counter, and you may need to use some, POSSIBLY even all of these options:    Drink plenty of fluids (prune juice may be helpful) and high fiber foods Colace 100 mg by mouth twice a day  Senokot for constipation as directed and as needed Dulcolax (bisacodyl), take with full glass of water  Miralax (polyethylene glycol) once or twice a day as  needed.  If you have tried all these things and are unable to have a bowel movement in the first 3-4 days after surgery call either your surgeon or your primary doctor.    If you experience loose stools or diarrhea, hold the medications until you stool forms back up.  If your symptoms do not get better within 1 week or if they get worse, check with your doctor.  If you experience "the worst abdominal pain ever" or develop nausea or vomiting, please contact the office immediately for further recommendations for treatment.   ITCHING:  If you experience itching with your medications, try taking only a single pain pill, or even half a pain pill at a time.  You can also use Benadryl over the counter for itching or also to help with sleep.   TED HOSE STOCKINGS:  Use stockings on both   legs until for at least 2 weeks or as directed by physician office. They may be removed at night for sleeping.  MEDICATIONS:  See your medication summary on the "After Visit Summary" that nursing will review with you.  You may have some home medications which will be placed on hold until you complete the course of blood thinner medication.  It is important for you to complete the blood thinner medication as prescribed.  PRECAUTIONS:  If you experience chest pain or shortness of breath - call 911 immediately for transfer to the hospital emergency department.   If you develop a fever greater that 101 F, purulent drainage from wound, increased redness or drainage from wound, foul odor from the wound/dressing, or calf pain - CONTACT YOUR SURGEON.                                                   FOLLOW-UP APPOINTMENTS:  If you do not already have a post-op appointment, please call the office for an appointment to be seen by your surgeon.  Guidelines for how soon to be seen are listed in your "After Visit Summary", but are typically between 1-4 weeks after surgery.  OTHER INSTRUCTIONS:   Knee Replacement:  Do not place pillow  under knee, focus on keeping the knee straight while resting. CPM instructions: 0-90 degrees, 2 hours in the morning, 2 hours in the afternoon, and 2 hours in the evening. Place foam block, curve side up under heel at all times except when in CPM or when walking.  DO NOT modify, tear, cut, or change the foam block in any way.  POST-OPERATIVE OPIOID TAPER INSTRUCTIONS: It is important to wean off of your opioid medication as soon as possible. If you do not need pain medication after your surgery it is ok to stop day one. Opioids include: Codeine, Hydrocodone(Norco, Vicodin), Oxycodone(Percocet, oxycontin) and hydromorphone amongst others.  Long term and even short term use of opiods can cause: Increased pain response Dependence Constipation Depression Respiratory depression And more.  Withdrawal symptoms can include Flu like symptoms Nausea, vomiting And more Techniques to manage these symptoms Hydrate well Eat regular healthy meals Stay active Use relaxation techniques(deep breathing, meditating, yoga) Do Not substitute Alcohol to help with tapering If you have been on opioids for less than two weeks and do not have pain than it is ok to stop all together.  Plan to wean off of opioids This plan should start within one week post op of your joint replacement. Maintain the same interval or time between taking each dose and first decrease the dose.  Cut the total daily intake of opioids by one tablet each day Next start to increase the time between doses. The last dose that should be eliminated is the evening dose.   MAKE SURE YOU:  Understand these instructions.  Get help right away if you are not doing well or get worse.    Thank you for letting us be a part of your medical care team.  It is a privilege we respect greatly.  We hope these instructions will help you stay on track for a fast and full recovery!     Information on my medicine - ELIQUIS (apixaban)  Why was Eliquis  prescribed for you? Eliquis was prescribed for you to reduce the risk of a blood clot forming  that can cause a stroke if you have a medical condition called atrial flutter (a type of irregular heartbeat). It is also used to prevent blood clots from forming after your orthopedic surgery.   What do You need to know about Eliquis ? Take your Eliquis TWICE DAILY - one tablet in the morning and one tablet in the evening with or without food. If you have difficulty swallowing the tablet whole please discuss with your pharmacist how to take the medication safely.  Take Eliquis exactly as prescribed by your doctor and DO NOT stop taking Eliquis without talking to the doctor who prescribed the medication.  Stopping may increase your risk of developing a stroke.  Refill your prescription before you run out.  After discharge, you should have regular check-up appointments with your healthcare provider that is prescribing your Eliquis.  In the future your dose may need to be changed if your kidney function or weight changes by a significant amount or as you get older.  What do you do if you miss a dose? If you miss a dose, take it as soon as you remember on the same day and resume taking twice daily.  Do not take more than one dose of ELIQUIS at the same time to make up a missed dose.  Important Safety Information A possible side effect of Eliquis is bleeding. You should call your healthcare provider right away if you experience any of the following: Bleeding from an injury or your nose that does not stop. Unusual colored urine (red or dark brown) or unusual colored stools (red or black). Unusual bruising for unknown reasons. A serious fall or if you hit your head (even if there is no bleeding).  Some medicines may interact with Eliquis and might increase your risk of bleeding or clotting while on Eliquis. To help avoid this, consult your healthcare provider or pharmacist prior to using any new  prescription or non-prescription medications, including herbals, vitamins, non-steroidal anti-inflammatory drugs (NSAIDs) and supplements.  This website has more information on Eliquis (apixaban): http://www.eliquis.com/eliquis/home

## 2021-04-24 NOTE — Op Note (Signed)
NAME: Norman Hudson, Norman Hudson. MEDICAL RECORD NO: [id number] ACCOUNT NO: [id number] DATE OF BIRTH: 1950/10/24 FACILITY: [facility]ATION: [id number] PHYSICIAN: W D. Valeta Harms., MD  Operative Report   DATE OF PROCEDURE: 04/24/2021  PREOPERATIVE DIAGNOSIS:  Severe osteoarthritis, right hip.  POSTOPERATIVE DIAGNOSIS:  Severe osteoarthritis, right hip.  PROCEDURE:  Right total hip replacement (DePuy size 5 femoral ACTIS stem, a 52-mm sector Gription cup 52 mm diameter with +4 mm 10-degree polyethylene liner with +5 mm neck length with 36 mm ceramic hip ball.  SURGEON:  W D. Valeta Harms., MD  FIRST ASSISTANTZachery Dakins  SECOND ASSISTANT:  [place], PA  BLOOD LOSS:  700.  ANESTHESIA:  Spinal.  DESCRIPTION OF PROCEDURE:  Curvilinear incision was made posterior approach to the hip, made with splitting of the iliotibial band and gluteus maximus.  We split the external rotators and released the piriformis and did a T-capsulotomy of the hip.  The  head was cut about one fingerbreadth above the lesser trochanter, noting severe osteoarthritis on the femoral head as well as the acetabulum.  We progressively broached to accept the #5 ACTIS stem.  Attention was next directed to the acetabulum.  Acetabular retractors were placed posteriorly and superiorly.  We performed a partial capsulectomy.  Reaming was progressively reamed up for a 1 mm under reaming to accept a 52 mm cup with about 45 degrees  of abduction, 10-15 degrees of anteversion.  Final cup was placed, noted to be in good position with a trial liner.  The femoral trial was placed with the ACTIS stem with noting excellent stability with range of motion, specifically the hip could be  flexed up to 100 degrees with full internal and external rotation before there was any tendency to dislocate.  Final components were inserted.  Acetabular liner followed by femoral stem and trial again placed.  Final components were then inserted.   Copious irrigation  and topical Marcaine with Exparel as well as TXA was used as well.  The T capsulotomy was closed with Ethibond and the capsule with Ethibond.  The fascia was closed with Ethibond as well as subcutaneous tissue with Vicryl and the skin  with Monocryl.  Compressive dressing and knee immobilizer applied.   PUS D: 04/24/2021 9:40:21 am T: 04/24/2021 2:38:00 pm  JOB: [id number]

## 2021-04-24 NOTE — Interval H&P Note (Signed)
History and Physical Interval Note:  04/24/2021 7:32 AM  Norman Hudson.  has presented today for surgery, with the diagnosis of OA RIGHT HIP.  The various methods of treatment have been discussed with the patient and family. After consideration of risks, benefits and other options for treatment, the patient has consented to  Procedure(s): TOTAL HIP ARTHROPLASTY (Right) as a surgical intervention.  The patient's history has been reviewed, patient examined, no change in status, stable for surgery.  I have reviewed the patient's chart and labs.  Questions were answered to the patient's satisfaction.     Yvette Rack

## 2021-04-24 NOTE — Plan of Care (Signed)
Pt rcvd from PACU s/p "TOTAL HIP ARTHROPLASTY (Right)" by Dr. French Ana.  Mr. Belew arrived to [phone] via bed on RA. A&Ox4. Denies pain, able to move all extremities x4. Limited mobility d/t knee immobilizer RLE. Endorses some tingling on R hip. 18G L wrist LR locked. Pt was oriented to unit. Incentive spirometer use explained and provided. SCDs in place. Fall precautions in place. Call bell in reach. Family wife and daughter at bedside. No areas of concern noted on skin assessment upon admission. Per pt report likely to be discharged home tomorrow.    Problem: Education: Goal: Knowledge of the prescribed therapeutic regimen will improve Outcome: Progressing Goal: Understanding of discharge needs will improve Outcome: Progressing Goal: Individualized Educational Video(s) Outcome: Progressing   Problem: Activity: Goal: Ability to avoid complications of mobility impairment will improve Outcome: Progressing Goal: Ability to tolerate increased activity will improve Outcome: Progressing   Problem: Clinical Measurements: Goal: Postoperative complications will be avoided or minimized Outcome: Progressing   Problem: Pain Management: Goal: Pain level will decrease with appropriate interventions Outcome: Progressing   Problem: Skin Integrity: Goal: Will show signs of wound healing Outcome: Progressing

## 2021-04-24 NOTE — Anesthesia Procedure Notes (Signed)
Spinal  Patient location during procedure: OR Start time: 04/24/2021 7:40 AM End time: 04/24/2021 7:45 AM Reason for block: surgical anesthesia Staffing Performed: anesthesiologist  Anesthesiologist: Suzette Battiest, MD Preanesthetic Checklist Completed: patient identified, IV checked, site marked, risks and benefits discussed, surgical consent, monitors and equipment checked, pre-op evaluation and timeout performed Spinal Block Patient position: sitting Prep: DuraPrep Patient monitoring: heart rate, cardiac monitor, continuous pulse ox and blood pressure Approach: midline Location: L3-4 Injection technique: single-shot Needle Needle type: Pencan  Needle gauge: 24 G Needle length: 9 cm Assessment Sensory level: T4 Events: CSF return

## 2021-04-24 NOTE — Brief Op Note (Signed)
04/24/2021  10:17 AM  PATIENT:  [name].  70 y.o. male  PRE-OPERATIVE DIAGNOSIS:  OA RIGHT HIP  POST-OPERATIVE DIAGNOSIS:  OA RIGHT HIP  PROCEDURE:  Procedure(s): TOTAL HIP ARTHROPLASTY (Right)  SURGEON:  Surgeon(s) and Role:    * Earlie Server, MD - Primary    * Marchwiany, Virgina Norfolk, MD - Assisting  PHYSICIAN ASSISTANT: Chriss Czar, PA-C  ASSISTANTS:    ANESTHESIA:   local, spinal, and IV sedation  EBL:  700 mL   BLOOD ADMINISTERED:none  DRAINS: none   LOCAL MEDICATIONS USED:  MARCAINE     SPECIMEN:  No Specimen  DISPOSITION OF SPECIMEN:  N/A  COUNTS:  YES  TOURNIQUET:  * No tourniquets in log *  DICTATION: .Other Dictation: Dictation Number unknown  PLAN OF CARE: Discharge to home after PACU  PATIENT DISPOSITION:  PACU - hemodynamically stable.   Delay start of Pharmacological VTE agent (>24hrs) due to surgical blood loss or risk of bleeding: yes

## 2021-04-25 ENCOUNTER — Encounter (HOSPITAL_COMMUNITY): Payer: Self-pay | Admitting: Orthopedic Surgery

## 2021-04-25 LAB — CBC WITH DIFFERENTIAL/PLATELET
Abs Immature Granulocytes: 0.05 10*3/uL (ref 0.00–0.07)
Basophils Absolute: 0 10*3/uL (ref 0.0–0.1)
Basophils Relative: 0 %
Eosinophils Absolute: 0 10*3/uL (ref 0.0–0.5)
Eosinophils Relative: 0 %
HCT: 25.1 % — ABNORMAL LOW (ref 39.0–52.0)
Hemoglobin: 8.6 g/dL — ABNORMAL LOW (ref 13.0–17.0)
Immature Granulocytes: 0 %
Lymphocytes Relative: 10 %
Lymphs Abs: 1.1 10*3/uL (ref 0.7–4.0)
MCH: 33.6 pg (ref 26.0–34.0)
MCHC: 34.3 g/dL (ref 30.0–36.0)
MCV: 98 fL (ref 80.0–100.0)
Monocytes Absolute: 2.1 10*3/uL — ABNORMAL HIGH (ref 0.1–1.0)
Monocytes Relative: 19 %
Neutro Abs: 7.9 10*3/uL — ABNORMAL HIGH (ref 1.7–7.7)
Neutrophils Relative %: 71 %
Platelets: 134 10*3/uL — ABNORMAL LOW (ref 150–400)
RBC: 2.56 MIL/uL — ABNORMAL LOW (ref 4.22–5.81)
RDW: 12.2 % (ref 11.5–15.5)
WBC: 11.1 10*3/uL — ABNORMAL HIGH (ref 4.0–10.5)
nRBC: 0 % (ref 0.0–0.2)

## 2021-04-25 LAB — BASIC METABOLIC PANEL
Anion gap: 9 (ref 5–15)
BUN: 18 mg/dL (ref 8–23)
CO2: 24 mmol/L (ref 22–32)
Calcium: 8.6 mg/dL — ABNORMAL LOW (ref 8.9–10.3)
Chloride: 101 mmol/L (ref 98–111)
Creatinine, Ser: 1.02 mg/dL (ref 0.61–1.24)
GFR, Estimated: >60 mL/min (ref >60)
Glucose, Bld: 140 mg/dL — ABNORMAL HIGH (ref 70–99)
Potassium: 3.8 mmol/L (ref 3.5–5.1)
Sodium: 134 mmol/L — ABNORMAL LOW (ref 135–145)

## 2021-04-25 LAB — GLUCOSE, CAPILLARY
Glucose-Capillary: 149 mg/dL — ABNORMAL HIGH (ref 70–99)
Glucose-Capillary: 147 mg/dL — ABNORMAL HIGH (ref 70–99)
Glucose-Capillary: 124 mg/dL — ABNORMAL HIGH (ref 70–99)
Glucose-Capillary: 147 mg/dL — ABNORMAL HIGH (ref 70–99)

## 2021-04-25 MED ORDER — POTASSIUM CHLORIDE 2 MEQ/ML IV SOLN
INTRAVENOUS | Status: DC
  Filled 2021-04-25 (×10): qty 1000

## 2021-04-25 MED ORDER — KCL-LACTATED RINGERS 20 MEQ/L IV SOLN
INTRAVENOUS | Status: DC
  Filled 2021-04-25: qty 1000

## 2021-04-25 NOTE — Progress Notes (Signed)
   ORTHOPAEDIC PROGRESS NOTE  s/p Procedure(s): TOTAL HIP ARTHROPLASTY on 04/25/2021 with Dr. French Ana  SUBJECTIVE: Reports mild pain about operative site. No pain at rest. 10/10 pain with movement. Blood pressure is a little low. He is worried about having difficulty urinating and a possible UTI. He feels some better after having catheter removed. No chest pain. No SOB. No nausea/vomiting. No other complaints.  OBJECTIVE: PE: General: sitting up in hospital bed, NAD Pulmonary: no increased work of breathing Cardiac: regular rate RLE: dressing CDI, knee immobilizer in place, intact EHL/TA/GSC, endorses distal sensation, warm well perfused foot   Vitals:   04/25/21 1012 04/25/21 1014  BP: (!) 78/53 (!) 84/54  Pulse: 75 72  Resp: 18   Temp: 98.3 F (36.8 C)   SpO2: 96%      ASSESSMENT: Norman Hudson. is a 70 y.o. male POD#1  PLAN: Weightbearing: WBAT RLE Insicional and dressing care: Reinforce dressings as needed Orthopedic device(s):  Knee immobilizer Showering: Post-op day #2 with assistance VTE prophylaxis: Resume Plavix Pain control: PRN pain medications, preferring oral medications ABLA: Hgb 8.6 today. Continue to monitor Blood pressure: Running a little low today. Fluids ordered. Continue to monitor.  Dispo: PT/OT Recommending home health. TOC following. HHPT arranged with [facility]. Patient has all dme. Plan for discharge in the AM.   Follow - up plan: In office with Dr. Toney Sang information:  After hours and holidays please check [email or web address] for group call information for [facility]  Noemi Chapel, PA-C 04/25/2021

## 2021-04-25 NOTE — TOC Progression Note (Signed)
Transition of Care (TOC) - Progression Note    Patient Details  Name: Norman Hudson. MRN: [id number] Date of Birth: 1950-09-17  Transition of Care [facility]) CM/SW Contact  Joaquin Courts, RN Phone Number: 04/25/2021, 10:31 AM  Clinical Narrative:    HHPT arranged with [facility].  Patient has all dme.    Expected Discharge Plan: [place] Barriers to Discharge: Continued Medical Work up  Expected Discharge Plan and Services Expected Discharge Plan: [place]   Discharge Planning Services: CM Consult Post Acute Care Choice: [place] arrangements for the past 2 months: Single Family Home Expected Discharge Date: 04/25/21               DME Arranged: N/A DME Agency: NA       HH Arranged: PT HH Agency: [facility] Date HH Agency Contacted: 04/25/21 Time [place]: 1030 Representative spoke with at [place]: [place] ([place]) Interventions    Readmission Risk Interventions No flowsheet data found.

## 2021-04-25 NOTE — Plan of Care (Signed)
  Problem: Education: Goal: Knowledge of the prescribed therapeutic regimen will improve Outcome: Progressing   Problem: Activity: Goal: Ability to avoid complications of mobility impairment will improve Outcome: Progressing   Problem: Pain Management: Goal: Pain level will decrease with appropriate interventions Outcome: Progressing   

## 2021-04-25 NOTE — Progress Notes (Signed)
Physical Therapy Treatment Patient Details Name: Norman Hudson. 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    PT Comments    Pt is cooperative but progressing minimally 2* c/o pain and orthostatic BP with attempts to mobilize.  With increased time and significant assist of two, pt assisted to bedside sitting, to standing and to side-step up side of bed before returning to bed with c/o fatigue, pain and mild dizziness.  BP supine 118/64, sitting 105/45, sitting x 1 min 117/68, standing 90/56 and after return to supine 111/60.  RN aware   Recommendations for follow up therapy are one component of a multi-disciplinary discharge planning process, led by the attending physician.  Recommendations may be updated based on patient status, additional functional criteria and insurance authorization.  Follow Up Recommendations  Home health PT     Equipment Recommendations  None recommended by PT    Recommendations for Other Services OT consult     Precautions / Restrictions Precautions Precautions: Posterior Hip;Fall Required Braces or Orthoses: Knee Immobilizer - Right Restrictions Weight Bearing Restrictions: No RLE Weight Bearing: Weight bearing as tolerated     Mobility  Bed Mobility Overal bed mobility: Needs Assistance Bed Mobility: Supine to Sit;Sit to Supine     Supine to sit: Mod assist;+2 for physical assistance;+2 for safety/equipment;HOB elevated Sit to supine: Mod assist;Max assist;+2 for physical assistance;+2 for safety/equipment   General bed mobility comments: Increased time with cues for sequence, use of R LE to self assist and adherence to THP.  Physical assist to manage LEs and to control trunk.    Transfers Overall transfer level: Needs assistance Equipment used: Rolling walker (2 wheeled) Transfers: Sit to/from  Stand Sit to Stand: Mod assist;+2 physical assistance;From elevated surface;+2 safety/equipment         General transfer comment: cues for LE management, adherence to THP, and use of UEs to self assist.  Physical assist to bring wt up and fwd and to balance in standing with RW  Ambulation/Gait Ambulation/Gait assistance: Min assist;+2 physical assistance;+2 safety/equipment Gait Distance (Feet): 3 Feet Assistive device: Rolling walker (2 wheeled) Gait Pattern/deviations: Step-to pattern;Decreased step length - right;Decreased step length - left;Shuffle;Trunk flexed Gait velocity: decr   General Gait Details: Pt side-stepped up side of bed only.  Increased time with cues for posture and sequence.   Stairs             Wheelchair Mobility    Modified Rankin (Stroke Patients Only)       Balance Overall balance assessment: Needs assistance Sitting-balance support: Feet supported;Single extremity supported Sitting balance-Leahy Scale: Poor     Standing balance support: Bilateral upper extremity supported Standing balance-Leahy Scale: Poor                              Cognition Arousal/Alertness: Awake/alert Behavior During Therapy: WFL for tasks assessed/performed Overall Cognitive Status: Within Functional Limits for tasks assessed                                        Exercises      General Comments        Pertinent Vitals/Pain Pain Assessment: 0-10 Pain Score: 8  Pain Location: R  hip Pain Descriptors / Indicators: Aching;Guarding;Grimacing;Sore Pain Intervention(s): Limited activity within patient's tolerance;Monitored during session;Premedicated before session;Ice applied    Home Living Family/patient expects to be discharged to:: Private residence Living Arrangements: Spouse/significant other Available Help at Discharge: Family Type of Home: House Home Access: Stairs to enter   Home Layout: One level Home Equipment:  Environmental consultant - 2 wheels;Cane - single point;Bedside commode      Prior Function Level of Independence: Independent with assistive device(s)      Comments: using cane for balance/support   PT Goals (current goals can now be found in the care plan section) Acute Rehab PT Goals Patient Stated Goal: Regain IND PT Goal Formulation: With patient Time For Goal Achievement: 05/08/21 Potential to Achieve Goals: Good Progress towards PT goals: Not progressing toward goals - comment (orthostatic)    Frequency    7X/week      PT Plan Current plan remains appropriate    Co-evaluation PT/OT/SLP Co-Evaluation/Treatment: Yes Reason for Co-Treatment: For patient/therapist safety;To address functional/ADL transfers PT goals addressed during session: Mobility/safety with mobility OT goals addressed during session: ADL's and self-care      AM-PAC PT "6 Clicks" Mobility   Outcome Measure  Help needed turning from your back to your side while in a flat bed without using bedrails?: A Lot Help needed moving from lying on your back to sitting on the side of a flat bed without using bedrails?: A Lot Help needed moving to and from a bed to a chair (including a wheelchair)?: A Lot Help needed standing up from a chair using your arms (e.g., wheelchair or bedside chair)?: A Lot Help needed to walk in hospital room?: A Lot Help needed climbing 3-5 steps with a railing? : Total 6 Click Score: 11    End of Session Equipment Utilized During Treatment: Gait belt Activity Tolerance: Patient limited by fatigue;Patient limited by pain Patient left: in bed;with call bell/phone within reach;with bed alarm set Nurse Communication: Mobility status PT Visit Diagnosis: Unsteadiness on feet (R26.81);Difficulty in walking, not elsewhere classified (R26.2);Muscle weakness (generalized) (M62.81)     Time: 0932-3557 PT Time Calculation (min) (ACUTE ONLY): 26 min  Charges:  $Therapeutic Activity: 8-22 mins                      Mauro Kaufmann PT Acute Rehabilitation Services Pager [phone] Office [phone]    Norman Hudson 04/25/2021, 4:13 PM

## 2021-04-25 NOTE — Progress Notes (Signed)
Physical Therapy Treatment Patient Details Name: Norman Hudson. 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    PT Comments    Pt performed therex with increased time and assist.  OOB deferred 2* low BP 92/49.  Will follow.   Recommendations for follow up therapy are one component of a multi-disciplinary discharge planning process, led by the attending physician.  Recommendations may be updated based on patient status, additional functional criteria and insurance authorization.  Follow Up Recommendations  Home health PT     Equipment Recommendations  None recommended by PT    Recommendations for Other Services OT consult     Precautions / Restrictions Precautions Precautions: Posterior Hip;Fall Precaution Booklet Issued: Yes (comment) Required Braces or Orthoses: Knee Immobilizer - Right Knee Immobilizer - Right: Other (comment) (on in bed) Restrictions Weight Bearing Restrictions: No RLE Weight Bearing: Weight bearing as tolerated     Mobility  Bed Mobility               General bed mobility comments: OOB deferred 2* low BP    Transfers                    Ambulation/Gait                 Stairs             Wheelchair Mobility    Modified Rankin (Stroke Patients Only)       Balance                                            Cognition Arousal/Alertness: Awake/alert Behavior During Therapy: WFL for tasks assessed/performed Overall Cognitive Status: Within Functional Limits for tasks assessed                                        Exercises Total Joint Exercises Ankle Circles/Pumps: AROM;15 reps;Supine;Both Quad Sets: AROM;Both;10 reps;Supine Heel Slides: AAROM;Right;20 reps;Supine Hip ABduction/ADduction: AAROM;Right;15 reps;Supine    General  Comments        Pertinent Vitals/Pain Pain Assessment: 0-10 Pain Score: 8  Pain Location: R hip Pain Descriptors / Indicators: Aching;Guarding;Grimacing;Sore Pain Intervention(s): Limited activity within patient's tolerance;Monitored during session;Premedicated before session;Ice applied    Home Living                      Prior Function            PT Goals (current goals can now be found in the care plan section) Acute Rehab PT Goals Patient Stated Goal: Regain IND PT Goal Formulation: With patient Time For Goal Achievement: 05/08/21 Potential to Achieve Goals: Good Progress towards PT goals: Progressing toward goals    Frequency    7X/week      PT Plan Current plan remains appropriate    Co-evaluation              AM-PAC PT "6 Clicks" Mobility   Outcome Measure  Help needed turning from your back to your side while in a flat bed without using bedrails?: A Lot Help needed moving from lying on your back to sitting  on the side of a flat bed without using bedrails?: A Lot Help needed moving to and from a bed to a chair (including a wheelchair)?: A Lot Help needed standing up from a chair using your arms (e.g., wheelchair or bedside chair)?: A Lot Help needed to walk in hospital room?: A Lot Help needed climbing 3-5 steps with a railing? : Total 6 Click Score: 11    End of Session   Activity Tolerance: Patient limited by fatigue;Patient limited by pain Patient left: in bed;with call bell/phone within reach;with bed alarm set Nurse Communication: Mobility status PT Visit Diagnosis: Unsteadiness on feet (R26.81);Difficulty in walking, not elsewhere classified (R26.2);Muscle weakness (generalized) (M62.81)     Time: 1610-9604 PT Time Calculation (min) (ACUTE ONLY): 20 min  Charges:  $Therapeutic Exercise: 8-22 mins                     Mauro Kaufmann PT Acute Rehabilitation Services Pager [phone] Office  [phone]    Timea Breed 04/25/2021, 11:56 AM

## 2021-04-25 NOTE — Evaluation (Addendum)
Occupational Therapy Evaluation Patient Details Name: Norman Hudson. 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   Clinical Impression   Patient is a 70 year old male who was admitted for above. Patient is currently mod a x 2 for all mobility and max A for ADL tasks. Patient was noted to have a decline in standing balance, endurance, functional activity tolerance and increased pain impacting ability to participate in ADL tasks. Patient would continue to benefit from skilled OT services at this time while admitted to address noted deficits in order to improve overall safety and independence in ADLs.  Blood pressures: Supine: 111/60 mmhg Sitting: 105/45 Standing 90/56 mmhg reported dizziness Back in bed: 117/68 mmhg      Recommendations for follow up therapy are one component of a multi-disciplinary discharge planning process, led by the attending physician.  Recommendations may be updated based on patient status, additional functional criteria and insurance authorization.   Follow Up Recommendations  Follow surgeon's recommendation for DC plan and follow-up therapies    Equipment Recommendations  Other (comment) (total hip kit)    Recommendations for Other Services       Precautions / Restrictions Precautions Precautions: Posterior Hip;Fall Precaution Booklet Issued: Yes (comment) Required Braces or Orthoses: Knee Immobilizer - Right Knee Immobilizer - Right: Other (comment) (on in bed) Restrictions Weight Bearing Restrictions: No RLE Weight Bearing: Weight bearing as tolerated      Mobility Bed Mobility Overal bed mobility: Needs Assistance Bed Mobility: Supine to Sit;Sit to Supine     Supine to sit: Mod assist;+2 for physical assistance;+2 for safety/equipment;HOB elevated Sit to supine: Mod assist;Max assist;+2 for physical  assistance;+2 for safety/equipment   General bed mobility comments: Increased time with cues for sequence, use of R LE to self assist and adherence to THP.  Physical assist to manage LEs and to control trunk.    Transfers Overall transfer level: Needs assistance Equipment used: Rolling walker (2 wheeled) Transfers: Sit to/from Stand Sit to Stand: Mod assist;+2 physical assistance;From elevated surface;+2 safety/equipment         General transfer comment: cues for LE management, adherence to THP, and use of UEs to self assist.  Physical assist to bring wt up and fwd and to balance in standing with RW    Balance Overall balance assessment: Needs assistance Sitting-balance support: Feet supported;Single extremity supported Sitting balance-Leahy Scale: Poor Sitting balance - Comments: patietn was noted to have pushing response with attempts at repositioning in bed Postural control: Posterior lean Standing balance support: Bilateral upper extremity supported Standing balance-Leahy Scale: Poor                             ADL either performed or assessed with clinical judgement   ADL Overall ADL's : Needs assistance/impaired Eating/Feeding: Set up;Bed level   Grooming: Bed level;Oral care;Wash/dry face;Set up   Upper Body Bathing: Bed level;Minimal assistance   Lower Body Bathing: Bed level;Maximal assistance;Adhering to hip precautions   Upper Body Dressing : Bed level;Moderate assistance Upper Body Dressing Details (indicate cue type and reason): patient was noted to have tight hips with increased posterior leaning when attempting sitting on edge of bed. Lower Body Dressing: Bed level;Maximal assistance   Toilet Transfer: Moderate assistance;+2 for physical assistance;+2 for safety/equipment Toilet Transfer Details (indicate cue type  and reason): transfer deferred on this date with BP dropping with movement. Toileting- Clothing Manipulation and Hygiene: Total  assistance;Sit to/from stand;+2 for physical assistance;+2 for safety/equipment       Functional mobility during ADLs: Moderate assistance;+2 for physical assistance;+2 for safety/equipment;Rolling walker General ADL Comments: patient was able to take few steps to head of bed with RW to improve positioning in bed prior to lying back down.     Vision Patient Visual Report: No change from baseline       Perception     Praxis      Pertinent Vitals/Pain Pain Assessment: 0-10 Pain Score: 8  Pain Location: R hip Pain Descriptors / Indicators: Aching;Guarding;Grimacing;Sore Pain Intervention(s): Limited activity within patient's tolerance;Monitored during session;Premedicated before session;Ice applied     Hand Dominance Right   Extremity/Trunk Assessment Upper Extremity Assessment Upper Extremity Assessment: Generalized weakness   Lower Extremity Assessment Lower Extremity Assessment: Defer to PT evaluation   Cervical / Trunk Assessment Cervical / Trunk Assessment: Kyphotic   Communication Communication Communication: HOH   Cognition Arousal/Alertness: Awake/alert Behavior During Therapy: WFL for tasks assessed/performed Overall Cognitive Status: Within Functional Limits for tasks assessed                                     General Comments       Exercises     Shoulder Instructions      Home Living Family/patient expects to be discharged to:: Private residence Living Arrangements: Spouse/significant other Available Help at Discharge: Family Type of Home: House Home Access: Stairs to enter Secretary/administrator of Steps: 1   Home Layout: One level     Bathroom Shower/Tub: Chief Strategy Officer: Handicapped height Bathroom Accessibility: Yes   Home Equipment: Environmental consultant - 2 wheels;Cane - single point;Bedside commode          Prior Functioning/Environment Level of Independence: Independent with assistive device(s)         Comments: using cane for balance/support        OT Problem List: Decreased strength;Decreased knowledge of use of DME or AE;Decreased activity tolerance;Impaired balance (sitting and/or standing);Decreased safety awareness      OT Treatment/Interventions: Self-care/ADL training;DME and/or AE instruction;Therapeutic activities;Balance training;Patient/family education;Neuromuscular education;Therapeutic exercise;Energy conservation    OT Goals(Current goals can be found in the care plan section) Acute Rehab OT Goals Patient Stated Goal: Regain IND OT Goal Formulation: With patient Time For Goal Achievement: 05/09/21 Potential to Achieve Goals: Good  OT Frequency: Min 2X/week   Barriers to D/C: Decreased caregiver support          Co-evaluation PT/OT/SLP Co-Evaluation/Treatment: Yes Reason for Co-Treatment: To address functional/ADL transfers;For patient/therapist safety PT goals addressed during session: Mobility/safety with mobility OT goals addressed during session: ADL's and self-care      AM-PAC OT "6 Clicks" Daily Activity     Outcome Measure Help from another person eating meals?: A Little Help from another person taking care of personal grooming?: A Little Help from another person toileting, which includes using toliet, bedpan, or urinal?: Total Help from another person bathing (including washing, rinsing, drying)?: Total Help from another person to put on and taking off regular upper body clothing?: A Lot Help from another person to put on and taking off regular lower body clothing?: Total 6 Click Score: 11   End of Session Nurse Communication: Mobility status  Activity Tolerance:   Patient left: in bed;with  call bell/phone within reach  OT Visit Diagnosis: Unsteadiness on feet (R26.81);Muscle weakness (generalized) (M62.81);Pain Pain - Right/Left: Right Pain - part of body: Leg                Time: 4166-0630 OT Time Calculation (min): 34 min Charges:  OT  General Charges $OT Visit: 1 Visit OT Evaluation $OT Eval Moderate Complexity: 1 Mod  Sharyn Blitz OTR/L, MS Acute Rehabilitation Department Office# [phone] Pager# [phone]   Chalmers Guest Rosanna Bickle 04/25/2021, 4:25 PM

## 2021-04-25 NOTE — Plan of Care (Signed)
  Problem: Clinical Measurements: Goal: Postoperative complications will be avoided or minimized Outcome: Progressing   Problem: Pain Management: Goal: Pain level will decrease with appropriate interventions Outcome: Progressing   Problem: Skin Integrity: Goal: Will show signs of wound healing Outcome: Progressing

## 2021-04-26 DIAGNOSIS — R69 Illness, unspecified: Secondary | ICD-10-CM | POA: Diagnosis not present

## 2021-04-26 DIAGNOSIS — Z79899 Other long term (current) drug therapy: Secondary | ICD-10-CM | POA: Diagnosis not present

## 2021-04-26 DIAGNOSIS — R7989 Other specified abnormal findings of blood chemistry: Secondary | ICD-10-CM | POA: Diagnosis not present

## 2021-04-26 DIAGNOSIS — I951 Orthostatic hypotension: Secondary | ICD-10-CM | POA: Diagnosis not present

## 2021-04-26 DIAGNOSIS — E039 Hypothyroidism, unspecified: Secondary | ICD-10-CM | POA: Diagnosis not present

## 2021-04-26 DIAGNOSIS — M1611 Unilateral primary osteoarthritis, right hip: Secondary | ICD-10-CM | POA: Diagnosis not present

## 2021-04-26 DIAGNOSIS — Z7982 Long term (current) use of aspirin: Secondary | ICD-10-CM | POA: Diagnosis not present

## 2021-04-26 DIAGNOSIS — E785 Hyperlipidemia, unspecified: Secondary | ICD-10-CM | POA: Diagnosis not present

## 2021-04-26 DIAGNOSIS — Z20822 Contact with and (suspected) exposure to covid-19: Secondary | ICD-10-CM | POA: Diagnosis not present

## 2021-04-26 DIAGNOSIS — Z8673 Personal history of transient ischemic attack (TIA), and cerebral infarction without residual deficits: Secondary | ICD-10-CM | POA: Diagnosis not present

## 2021-04-26 DIAGNOSIS — G4733 Obstructive sleep apnea (adult) (pediatric): Secondary | ICD-10-CM | POA: Diagnosis not present

## 2021-04-26 DIAGNOSIS — Z7984 Long term (current) use of oral hypoglycemic drugs: Secondary | ICD-10-CM | POA: Diagnosis not present

## 2021-04-26 DIAGNOSIS — D62 Acute posthemorrhagic anemia: Secondary | ICD-10-CM | POA: Diagnosis not present

## 2021-04-26 DIAGNOSIS — K760 Fatty (change of) liver, not elsewhere classified: Secondary | ICD-10-CM | POA: Diagnosis not present

## 2021-04-26 DIAGNOSIS — Z87891 Personal history of nicotine dependence: Secondary | ICD-10-CM | POA: Diagnosis not present

## 2021-04-26 DIAGNOSIS — Z7902 Long term (current) use of antithrombotics/antiplatelets: Secondary | ICD-10-CM | POA: Diagnosis not present

## 2021-04-26 DIAGNOSIS — Z87442 Personal history of urinary calculi: Secondary | ICD-10-CM | POA: Diagnosis not present

## 2021-04-26 DIAGNOSIS — Z79890 Hormone replacement therapy (postmenopausal): Secondary | ICD-10-CM | POA: Diagnosis not present

## 2021-04-26 DIAGNOSIS — I4892 Unspecified atrial flutter: Secondary | ICD-10-CM | POA: Diagnosis not present

## 2021-04-26 DIAGNOSIS — Z96641 Presence of right artificial hip joint: Secondary | ICD-10-CM | POA: Diagnosis not present

## 2021-04-26 DIAGNOSIS — Z8249 Family history of ischemic heart disease and other diseases of the circulatory system: Secondary | ICD-10-CM | POA: Diagnosis not present

## 2021-04-26 DIAGNOSIS — L719 Rosacea, unspecified: Secondary | ICD-10-CM | POA: Diagnosis not present

## 2021-04-26 DIAGNOSIS — F419 Anxiety disorder, unspecified: Secondary | ICD-10-CM | POA: Diagnosis not present

## 2021-04-26 DIAGNOSIS — Z7901 Long term (current) use of anticoagulants: Secondary | ICD-10-CM | POA: Diagnosis not present

## 2021-04-26 DIAGNOSIS — Z9181 History of falling: Secondary | ICD-10-CM | POA: Diagnosis not present

## 2021-04-26 DIAGNOSIS — E119 Type 2 diabetes mellitus without complications: Secondary | ICD-10-CM | POA: Diagnosis not present

## 2021-04-26 DIAGNOSIS — I10 Essential (primary) hypertension: Secondary | ICD-10-CM | POA: Diagnosis not present

## 2021-04-26 DIAGNOSIS — G8929 Other chronic pain: Secondary | ICD-10-CM | POA: Diagnosis not present

## 2021-04-26 LAB — GLUCOSE, CAPILLARY
Glucose-Capillary: 142 mg/dL — ABNORMAL HIGH (ref 70–99)
Glucose-Capillary: 124 mg/dL — ABNORMAL HIGH (ref 70–99)
Glucose-Capillary: 106 mg/dL — ABNORMAL HIGH (ref 70–99)
Glucose-Capillary: 126 mg/dL — ABNORMAL HIGH (ref 70–99)

## 2021-04-26 MED ORDER — SODIUM CHLORIDE 0.9 % IV BOLUS
1000.0000 mL | Freq: Once | INTRAVENOUS | Status: AC
  Administered 2021-04-26: 1000 mL via INTRAVENOUS

## 2021-04-26 NOTE — Progress Notes (Signed)
RT went to inquire about cpap and pt said he brought his own from home but he would like to pass on it tonight. He states he is feeling fine advised to let the RN know if he changes his mind.

## 2021-04-26 NOTE — Progress Notes (Signed)
Physical Therapy Treatment Patient Details Name: Norman Hudson. 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    PT Comments    Pt continues cooperative and did progress to ambulating short distance in room and up in recliner but continues limited by orthostatic hypotension with attempts to mobilize.  BP supine 97/54, sittting 92/53, standing 89/72, after ambulating ~ 10' 73/59, and after return to recliner 110/60.  RN is aware.  Pt denies symptoms throughout.   Recommendations for follow up therapy are one component of a multi-disciplinary discharge planning process, led by the attending physician.  Recommendations may be updated based on patient status, additional functional criteria and insurance authorization.  Follow Up Recommendations  Home health PT     Equipment Recommendations  None recommended by PT    Recommendations for Other Services OT consult     Precautions / Restrictions Precautions Precautions: Posterior Hip;Fall Precaution Comments: Pt recalls all THP with increased time Required Braces or Orthoses: Knee Immobilizer - Right Knee Immobilizer - Right: Other (comment) (off for ambulation) Restrictions Weight Bearing Restrictions: No RLE Weight Bearing: Weight bearing as tolerated     Mobility  Bed Mobility Overal bed mobility: Needs Assistance Bed Mobility: Supine to Sit     Supine to sit: +2 for physical assistance;+2 for safety/equipment;HOB elevated;Min assist;Mod assist     General bed mobility comments: Increased time with cues for sequence, use of R LE to self assist and adherence to THP.  Physical assist to manage LEs and to control trunk.    Transfers Overall transfer level: Needs assistance Equipment used: Rolling walker (2 wheeled) Transfers: Sit to/from Stand Sit to Stand: Min assist;Mod assist;+2  physical assistance;+2 safety/equipment;From elevated surface         General transfer comment: cues for LE management, adherence to THP, and use of UEs to self assist.  Physical assist to bring wt up and fwd and to balance in standing with RW  Ambulation/Gait Ambulation/Gait assistance: Min assist;+2 physical assistance;+2 safety/equipment Gait Distance (Feet): 10 Feet Assistive device: Rolling walker (2 wheeled) Gait Pattern/deviations: Step-to pattern;Decreased step length - right;Decreased step length - left;Shuffle;Trunk flexed Gait velocity: decr   General Gait Details: Increased time with cues for posture, sequence and position from RW.   Stairs             Wheelchair Mobility    Modified Rankin (Stroke Patients Only)       Balance Overall balance assessment: Needs assistance Sitting-balance support: Feet supported;No upper extremity supported Sitting balance-Leahy Scale: Fair     Standing balance support: Bilateral upper extremity supported Standing balance-Leahy Scale: Poor                              Cognition Arousal/Alertness: Awake/alert Behavior During Therapy: WFL for tasks assessed/performed Overall Cognitive Status: Within Functional Limits for tasks assessed                                        Exercises      General Comments        Pertinent Vitals/Pain Pain Assessment: 0-10 Pain Score: 6  Pain Location: R hip Pain Descriptors / Indicators: Aching;Guarding;Grimacing;Sore Pain Intervention(s): Limited activity within patient's tolerance;Monitored  during session;Premedicated before session;Ice applied    Home Living                      Prior Function            PT Goals (current goals can now be found in the care plan section) Acute Rehab PT Goals Patient Stated Goal: Regain IND PT Goal Formulation: With patient Time For Goal Achievement: 05/08/21 Potential to Achieve Goals:  Good Progress towards PT goals: Progressing toward goals    Frequency    7X/week      PT Plan Current plan remains appropriate    Co-evaluation              AM-PAC PT "6 Clicks" Mobility   Outcome Measure  Help needed turning from your back to your side while in a flat bed without using bedrails?: A Lot Help needed moving from lying on your back to sitting on the side of a flat bed without using bedrails?: A Lot Help needed moving to and from a bed to a chair (including a wheelchair)?: A Lot Help needed standing up from a chair using your arms (e.g., wheelchair or bedside chair)?: A Lot Help needed to walk in hospital room?: A Lot Help needed climbing 3-5 steps with a railing? : Total 6 Click Score: 11    End of Session Equipment Utilized During Treatment: Gait belt Activity Tolerance: Other (comment) (orthostatic) Patient left: in chair;with call bell/phone within reach;with chair alarm set;with family/visitor present Nurse Communication: Mobility status PT Visit Diagnosis: Unsteadiness on feet (R26.81);Difficulty in walking, not elsewhere classified (R26.2);Muscle weakness (generalized) (M62.81)     Time: 0981-1914 PT Time Calculation (min) (ACUTE ONLY): 29 min  Charges:  $Gait Training: 8-22 mins $Therapeutic Activity: 8-22 mins                     Norman Hudson PT Acute Rehabilitation Services Pager [phone] Office [phone]    Norman Hudson 04/26/2021, 12:34 PM

## 2021-04-26 NOTE — Progress Notes (Signed)
Physical Therapy Treatment Patient Details Name: Norman Hudson. 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    PT Comments    Pt continues very cooperative but progressing slowly with mobility 2* c/o pain and ongoing postural hypotension.  Pt requiring decreased but still significant assist as well as increased time for all mobility tasks.  BP supine 99/57; sitting 93/56; standing 84/52; after ambulating 10' 84/52 and back in supine 103/61 - RN aware.  Recommendations for follow up therapy are one component of a multi-disciplinary discharge planning process, led by the attending physician.  Recommendations may be updated based on patient status, additional functional criteria and insurance authorization.  Follow Up Recommendations  Home health PT     Equipment Recommendations  None recommended by PT    Recommendations for Other Services OT consult     Precautions / Restrictions Precautions Precautions: Posterior Hip;Fall Precaution Comments: Pt recalls all THP with increased time Required Braces or Orthoses: Knee Immobilizer - Right Knee Immobilizer - Right: Other (comment) Restrictions Weight Bearing Restrictions: No RLE Weight Bearing: Weight bearing as tolerated     Mobility  Bed Mobility Overal bed mobility: Needs Assistance Bed Mobility: Sit to Supine       Sit to supine: Mod assist;+2 for physical assistance;+2 for safety/equipment   General bed mobility comments: Increased time with cues for sequence, use of R LE to self assist and adherence to THP.  Physical assist to manage LEs and to control trunk.    Transfers Overall transfer level: Needs assistance Equipment used: Rolling walker (2 wheeled) Transfers: Sit to/from Stand Sit to Stand: Min assist;Mod assist;+2 physical assistance;+2 safety/equipment;From elevated  surface         General transfer comment: cues for LE management, adherence to THP, and use of UEs to self assist.  Physical assist to bring wt up and fwd and to balance in standing with RW  Ambulation/Gait Ambulation/Gait assistance: Min assist;+2 physical assistance;+2 safety/equipment Gait Distance (Feet): 10 Feet Assistive device: Rolling walker (2 wheeled) Gait Pattern/deviations: Step-to pattern;Decreased step length - right;Decreased step length - left;Shuffle;Trunk flexed Gait velocity: decr   General Gait Details: Increased time with cues for posture, sequence and position from RW.   Stairs             Wheelchair Mobility    Modified Rankin (Stroke Patients Only)       Balance Overall balance assessment: Needs assistance Sitting-balance support: Feet supported;No upper extremity supported Sitting balance-Leahy Scale: Good     Standing balance support: Bilateral upper extremity supported Standing balance-Leahy Scale: Poor                              Cognition Arousal/Alertness: Awake/alert Behavior During Therapy: WFL for tasks assessed/performed Overall Cognitive Status: Within Functional Limits for tasks assessed                                        Exercises Total Joint Exercises Ankle Circles/Pumps: AROM;15 reps;Supine;Both Quad Sets: AROM;Both;10 reps;Supine Heel Slides: AAROM;Right;20 reps;Supine Hip ABduction/ADduction: AAROM;Right;15 reps;Supine    General Comments        Pertinent Vitals/Pain Pain Assessment: 0-10 Pain Score: 6  Pain Location: R hip Pain Descriptors /  Indicators: Aching;Guarding;Grimacing;Sore Pain Intervention(s): Limited activity within patient's tolerance;Monitored during session;Premedicated before session;Ice applied    Home Living                      Prior Function            PT Goals (current goals can now be found in the care plan section) Acute Rehab PT  Goals Patient Stated Goal: Regain IND PT Goal Formulation: With patient Time For Goal Achievement: 05/08/21 Potential to Achieve Goals: Good Progress towards PT goals: Progressing toward goals    Frequency    7X/week      PT Plan Current plan remains appropriate    Co-evaluation              AM-PAC PT "6 Clicks" Mobility   Outcome Measure  Help needed turning from your back to your side while in a flat bed without using bedrails?: A Lot Help needed moving from lying on your back to sitting on the side of a flat bed without using bedrails?: A Lot Help needed moving to and from a bed to a chair (including a wheelchair)?: A Lot Help needed standing up from a chair using your arms (e.g., wheelchair or bedside chair)?: A Lot Help needed to walk in hospital room?: A Lot Help needed climbing 3-5 steps with a railing? : Total 6 Click Score: 11    End of Session Equipment Utilized During Treatment: Gait belt Activity Tolerance: Other (comment) (orthostatic) Patient left: in bed;with call bell/phone within reach;with bed alarm set Nurse Communication: Mobility status PT Visit Diagnosis: Unsteadiness on feet (R26.81);Difficulty in walking, not elsewhere classified (R26.2);Muscle weakness (generalized) (M62.81)     Time: 7829-5621 PT Time Calculation (min) (ACUTE ONLY): 40 min  Charges:  $Gait Training: 8-22 mins $Therapeutic Exercise: 8-22 mins $Therapeutic Activity: 8-22 mins                     Mauro Kaufmann PT Acute Rehabilitation Services Pager [phone] Office [phone]    Hanzel Pizzo 04/26/2021, 4:42 PM

## 2021-04-26 NOTE — Progress Notes (Signed)
Subjective: Patient reports pain as mild to moderate. Controlled with medicine. Tolerating diet. Urinating. No CP, SOB.  Having issues with orthostatic hypotension which is limiting his ability to work with PT on mobilization OOB. Reports he has never had issues like this before and that his BP is usually high not low. Does not feel comfortable going home yet. His wife agrees.   Objective:   VITALS:   Vitals:   04/25/21 1014 04/25/21 1433 04/25/21 2134 04/26/21 0443  BP: (!) 84/54 110/62 (!) 104/54 (!) 101/56  Pulse: 72 87 83 94  Resp:  '16 17 16  '$ Temp:   99.2 F (37.3 C) 100 F (37.8 C)  TempSrc:   Oral Oral  SpO2:  98% 94% 93%  Weight:      Height:       CBC Latest Ref Rng & Units 04/25/2021 04/16/2021 07/08/2019  WBC 4.0 - 10.5 K/uL 11.1(H) 7.6 -  Hemoglobin 13.0 - 17.0 g/dL 8.6(L) 12.7(L) 15.0  Hematocrit 39.0 - 52.0 % 25.1(L) 37.1(L) 44.0  Platelets 150 - 400 K/uL 134(L) 206 -   BMP Latest Ref Rng & Units 04/25/2021 04/16/2021 07/08/2019  Glucose 70 - 99 mg/dL 140(H) 121(H) 144(H)  BUN 8 - 23 mg/dL 18 26(H) 21  Creatinine 0.61 - 1.24 mg/dL 1.02 1.10 0.90  BUN/Creat Ratio 10 - 24 - - -  Sodium 135 - 145 mmol/L 134(L) 139 137  Potassium 3.5 - 5.1 mmol/L 3.8 4.6 4.5  Chloride 98 - 111 mmol/L 101 104 102  CO2 22 - 32 mmol/L 24 26 -  Calcium 8.9 - 10.3 mg/dL 8.6(L) 9.8 -   Intake/Output      09/17 0701 09/18 0700 09/18 0701 09/19 0700   P.O. 600 120   I.V. (mL/kg) 907.7 (9.5) 92.6 (1)   IV Piggyback 0 0   Total Intake(mL/kg) 1507.7 (15.8) 212.6 (2.2)   Urine (mL/kg/hr) 1075 (0.5) 300 (0.5)   Stool     Blood     Total Output 1075 300   Net +432.7 -87.4           Physical Exam: General: NAD.  Sitting up in bedside chair finishing lunch. Calm, conversant Resp: No increased wob Cardio: regular rate and rhythm ABD soft Neurologically intact MSK Neurovascularly intact Sensation intact distally Intact pulses distally Dorsiflexion/Plantar flexion  intact Incision: dressing C/D/I, ice in place   Assessment: 2 Days Post-Op  S/P Procedure(s) (LRB): TOTAL HIP ARTHROPLASTY (Right) by Dr. French Ana on 04/24/21  Active Problems:   Degenerative joint disease of right hip   Plan: Will order 1L fluid bolus to try and help increase BP so better able to take part in PT sessions  Advance diet Up with therapy Incentive Spirometry Elevate and Apply ice  Weightbearing: WBAT RLE Insicional and dressing care: Dressings left intact until follow-up and Reinforce dressings as needed Orthopedic device(s):  KI to help with posterior hip precautions Showering: Keep dressing dry VTE prophylaxis:  Resume Plavix '75mg'$  daily,   ASA '81mg'$  bid , SCDs, ambulation Pain control: Continue current regimen. Try to limit narcotics if possible as this may be contributing to the hypotension Follow - up plan: 2 weeks in the office with Dr. French Ana  Dispo: Home hopefully tomorrow. Patient and his wife adamant that he can not safely d/c home yet. Said he wanted to stay "at least 1 if not 2 more nights". Also says he wants to set up HHPT rather than OPPT.     Prestyn Mahn Georgianne Fick,  PA-C Office [phone] 04/26/2021, 1:27 PM

## 2021-04-26 NOTE — Progress Notes (Signed)
Patient stated that he does not want to wear his home CPAP tonight

## 2021-04-26 NOTE — Plan of Care (Signed)
Plan of care reviewed and discussed with the patient. 

## 2021-04-27 DIAGNOSIS — D62 Acute posthemorrhagic anemia: Secondary | ICD-10-CM

## 2021-04-27 LAB — GLUCOSE, CAPILLARY
Glucose-Capillary: 138 mg/dL — ABNORMAL HIGH (ref 70–99)
Glucose-Capillary: 131 mg/dL — ABNORMAL HIGH (ref 70–99)
Glucose-Capillary: 110 mg/dL — ABNORMAL HIGH (ref 70–99)
Glucose-Capillary: 163 mg/dL — ABNORMAL HIGH (ref 70–99)

## 2021-04-27 LAB — URINALYSIS, ROUTINE W REFLEX MICROSCOPIC
Bacteria, UA: NONE SEEN
Bilirubin Urine: NEGATIVE
Glucose, UA: NEGATIVE mg/dL
Ketones, ur: NEGATIVE mg/dL
Nitrite: NEGATIVE
Protein, ur: 30 mg/dL — AB
Specific Gravity, Urine: 1.01 (ref 1.005–1.030)
pH: 7 (ref 5.0–8.0)

## 2021-04-27 LAB — CBC WITH DIFFERENTIAL/PLATELET
Abs Immature Granulocytes: 0.05 10*3/uL (ref 0.00–0.07)
Basophils Absolute: 0 10*3/uL (ref 0.0–0.1)
Basophils Relative: 0 %
Eosinophils Absolute: 0.2 10*3/uL (ref 0.0–0.5)
Eosinophils Relative: 2 %
HCT: 21.7 % — ABNORMAL LOW (ref 39.0–52.0)
Hemoglobin: 7.6 g/dL — ABNORMAL LOW (ref 13.0–17.0)
Immature Granulocytes: 1 %
Lymphocytes Relative: 8 %
Lymphs Abs: 0.7 10*3/uL (ref 0.7–4.0)
MCH: 34.2 pg — ABNORMAL HIGH (ref 26.0–34.0)
MCHC: 35 g/dL (ref 30.0–36.0)
MCV: 97.7 fL (ref 80.0–100.0)
Monocytes Absolute: 1.1 10*3/uL — ABNORMAL HIGH (ref 0.1–1.0)
Monocytes Relative: 13 %
Neutro Abs: 6.6 10*3/uL (ref 1.7–7.7)
Neutrophils Relative %: 76 %
Platelets: 130 10*3/uL — ABNORMAL LOW (ref 150–400)
RBC: 2.22 MIL/uL — ABNORMAL LOW (ref 4.22–5.81)
RDW: 12.4 % (ref 11.5–15.5)
WBC: 8.7 10*3/uL (ref 4.0–10.5)
nRBC: 0 % (ref 0.0–0.2)

## 2021-04-27 LAB — PREPARE RBC (CROSSMATCH)

## 2021-04-27 MED ORDER — SODIUM CHLORIDE 0.9% IV SOLUTION: Freq: Once | INTRAVENOUS | Status: AC

## 2021-04-27 NOTE — TOC Progression Note (Signed)
Transition of Care (TOC) - Progression Note   Patient Details  Name: Norman Hudson. MRN: [id number] Date of Birth: [birth year]  Transition of Care [facility]) CM/SW [place], LCSW Phone Number: 04/27/2021, 11:43 AM  Clinical Narrative: Ortho requested TOC confirmed discharge needs have been addressed as the original discharge plan changed. CSW confirmed with patient he was set up with [facility] for [place]. Patient reported he has a RW, 3N1, and cane at home and the rugs have been removed at home to preventing tripping/falls. There are no other TOC needs at this time. TOC signing off.  Expected Discharge Plan: [place] Barriers to Discharge: Continued Medical Work up  Expected Discharge Plan and Services Expected Discharge Plan: [place] Discharge Planning Services: CM Consult Post Acute Care Choice: [place] arrangements for the past 2 months: Single Family Home Expected Discharge Date: 04/25/21               DME Arranged: N/A DME Agency: NA HH Arranged: PT HH Agency: [place] Date HH Agency Contacted: 04/25/21 Time HH Agency Contacted: 64 Representative spoke with at [place]: [place]  Readmission Risk Interventions No flowsheet data found.

## 2021-04-27 NOTE — Progress Notes (Signed)
Physical Therapy Treatment Patient Details Name: Norman Hudson. 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    PT Comments    POD # 3 pm session Pt OOB to recliner with nursing this morning.  Did not have an am PT session.  Waited until pt completed one full unit of blood.  Scheduled to receive 2.   Assisted OUT of recliner to amb was limited.  General transfer comment: required increased assist to rise from lower recliner level + 2 side by side assist with 50% VC's to avoid hip flex > 90 degrees during sit to stand and stand to sit.  25% VC's to extend R LE prior as well.General Gait Details: Increased time with cues for posture, sequence and position from RW.  Recliner CLOSELY following as precaution.  Pt received one unit of blood and set to receive another.  3 min BP was 115/62 (better) pt with no c/o dizziness just "a little fuzzy" but "better".  Assisted back to bed as pt was in recliner most of morning and to receive his second unit blood.  Educated on proper positioning of  R LE to adhere to his THP and applied ICE.   Told pt we would be more aggressive with his Therapy tomorrow as he plans to D/C to home.   Recommendations for follow up therapy are one component of a multi-disciplinary discharge planning process, led by the attending physician.  Recommendations may be updated based on patient status, additional functional criteria and insurance authorization.  Follow Up Recommendations  Home health PT     Equipment Recommendations  None recommended by PT    Recommendations for Other Services       Precautions / Restrictions Precautions Precautions: Posterior Hip;Fall Precaution Booklet Issued: Yes (comment) Precaution Comments: Pt recalls all THP with increased time Required Braces or Orthoses: Knee Immobilizer - Right Knee  Immobilizer - Right: Other (comment) (whiloe IN BED for psitioning/THP) Restrictions Weight Bearing Restrictions: No RLE Weight Bearing: Weight bearing as tolerated     Mobility  Bed Mobility Overal bed mobility: Needs Assistance Bed Mobility: Sit to Supine       Sit to supine: Mod assist;+2 for physical assistance;+2 for safety/equipment   General bed mobility comments: assisted B LE up onto bed and scooting to [facility] + 2 assist then positioned to comfort.    Transfers Overall transfer level: Needs assistance Equipment used: Rolling walker (2 wheeled) Transfers: Sit to/from Stand Sit to Stand: Mod assist;+2 physical assistance;+2 safety/equipment         General transfer comment: required increased assist to rise from lower recliner level + 2 side by side assist with 50% VC's to avoid hip flex > 90 degrees during sit to stand and stand to sit.  25% VC's to extend R LE prior as well.  Ambulation/Gait Ambulation/Gait assistance: Min assist;+2 physical assistance;+2 safety/equipment Gait Distance (Feet): 12 Feet Assistive device: Rolling walker (2 wheeled) Gait Pattern/deviations: Step-to pattern;Decreased step length - right;Decreased step length - left;Shuffle;Trunk flexed Gait velocity: decreased   General Gait Details: Increased time with cues for posture, sequence and position from RW.  Recliner CLOSELY following as precaution.  Pt received one unit of blood and set to receive another.  3 min BP was 115/62 (better) pt with no c/o dizziness just "a little fuzzy" but "better".   Stairs  Wheelchair Mobility    Modified Rankin (Stroke Patients Only)       Balance                                            Cognition Arousal/Alertness: Awake/alert Behavior During Therapy: WFL for tasks assessed/performed Overall Cognitive Status: Within Functional Limits for tasks assessed                                 General  Comments: AxO x 3 very motivated/pleasant      Exercises      General Comments        Pertinent Vitals/Pain Pain Assessment: 0-10 Pain Score: 6  Faces Pain Scale: Hurts a little bit Pain Location: R hip down to knee Pain Descriptors / Indicators: Grimacing;Operative site guarding;Tender;Tightness Pain Intervention(s): Monitored during session;Premedicated before session;Repositioned;Ice applied    Home Living                      Prior Function            PT Goals (current goals can now be found in the care plan section) Acute Rehab PT Goals Patient Stated Goal: Regain IND Progress towards PT goals: Progressing toward goals    Frequency    7X/week      PT Plan Current plan remains appropriate    Co-evaluation              AM-PAC PT "6 Clicks" Mobility   Outcome Measure  Help needed turning from your back to your side while in a flat bed without using bedrails?: A Lot Help needed moving from lying on your back to sitting on the side of a flat bed without using bedrails?: A Lot Help needed moving to and from a bed to a chair (including a wheelchair)?: A Lot Help needed standing up from a chair using your arms (e.g., wheelchair or bedside chair)?: A Lot Help needed to walk in hospital room?: A Lot Help needed climbing 3-5 steps with a railing? : Total 6 Click Score: 11    End of Session Equipment Utilized During Treatment: Gait belt Activity Tolerance: Patient limited by fatigue Patient left: in bed;with call bell/phone within reach;with bed alarm set Nurse Communication: Mobility status PT Visit Diagnosis: Unsteadiness on feet (R26.81);Difficulty in walking, not elsewhere classified (R26.2);Muscle weakness (generalized) (M62.81)     Time: 1610-9604 PT Time Calculation (min) (ACUTE ONLY): 27 min  Charges:  $Gait Training: 8-22 mins $Therapeutic Exercise: 8-22 mins                     Felecia Shelling  PTA Acute  Rehabilitation  Services Pager      [phone] Office      [phone]

## 2021-04-27 NOTE — Progress Notes (Signed)
Occupational Therapy Treatment Patient Details Name: Norman Hudson. 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   OT comments  Patient was seated in recliner at start of session during transfusion. Patient and wife were educated on AE for LB dressing to maintain precautions. Patients wife inquired about strategies to don compression stockings. Wife was educated on using grocery bag/slip sock to reduce friction. Patients wife verbalized understanding. Patient's discharge plan remains appropriate at this time. OT will continue to follow acutely.     Recommendations for follow up therapy are one component of a multi-disciplinary discharge planning process, led by the attending physician.  Recommendations may be updated based on patient status, additional functional criteria and insurance authorization.    Follow Up Recommendations  Home health OT    Equipment Recommendations  Other (comment) (total hip kit)    Recommendations for Other Services      Precautions / Restrictions Precautions Precautions: Posterior Hip;Fall Required Braces or Orthoses: Knee Immobilizer - Right Knee Immobilizer - Right: Other (comment) (when in bed) Restrictions Weight Bearing Restrictions: No RLE Weight Bearing: Weight bearing as tolerated       Mobility Bed Mobility                    Transfers                      Balance                                           ADL either performed or assessed with clinical judgement   ADL Overall ADL's : Needs assistance/impaired                                       General ADL Comments: patient was seated in recliner at this time. patient and wife were educated on total hip kit with verbal and visual demonstration for long handled shoe horn, sock aid, leg lifter,  and strategies to don compression stockings. patient and wife verbalized understanding. patient and wife were educated on where to purchase items. wife reported she would look into it.     Vision Patient Visual Report: No change from baseline     Perception     Praxis      Cognition Arousal/Alertness: Awake/alert Behavior During Therapy: WFL for tasks assessed/performed Overall Cognitive Status: Within Functional Limits for tasks assessed                                          Exercises     Shoulder Instructions       General Comments      Pertinent Vitals/ Pain       Pain Assessment: Faces Faces Pain Scale: Hurts a little bit Pain Location: R hip Pain Descriptors / Indicators: Grimacing Pain Intervention(s): Repositioned;Monitored during session  Home Living  Prior Functioning/Environment              Frequency  Min 2X/week        Progress Toward Goals  OT Goals(current goals can now be found in the care plan section)  Progress towards OT goals: Progressing toward goals  Acute Rehab OT Goals Patient Stated Goal: Regain IND  Plan      Co-evaluation                 AM-PAC OT "6 Clicks" Daily Activity     Outcome Measure   Help from another person eating meals?: A Little Help from another person taking care of personal grooming?: A Little Help from another person toileting, which includes using toliet, bedpan, or urinal?: Total Help from another person bathing (including washing, rinsing, drying)?: Total Help from another person to put on and taking off regular upper body clothing?: A Little Help from another person to put on and taking off regular lower body clothing?: Total 6 Click Score: 12    End of Session    OT Visit Diagnosis: Unsteadiness on feet (R26.81);Muscle weakness (generalized) (M62.81);Pain Pain - Right/Left: Right Pain - part of body: Leg    Activity Tolerance Patient tolerated treatment well   Patient Left in chair;with call bell/phone within reach;with family/visitor present   Nurse Communication Other (comment) (nurse cleared patient to participate in seated education session)        Time: 5784-6962 OT Time Calculation (min): 23 min  Charges: OT General Charges $OT Visit: 1 Visit OT Treatments $Self Care/Home Management : 23-37 mins  Sharyn Blitz OTR/L, MS Acute Rehabilitation Department Office# [phone] Pager# [phone]   [place] Guest Kaelin Holford 04/27/2021, 1:48 PM

## 2021-04-27 NOTE — Anesthesia Postprocedure Evaluation (Signed)
Anesthesia Post Note  Patient: Norman Hudson.  Procedure(s) Performed: TOTAL HIP ARTHROPLASTY (Right: Hip)     Patient location during evaluation: PACU Anesthesia Type: Spinal Level of consciousness: awake and alert Pain management: pain level controlled Vital Signs Assessment: post-procedure vital signs reviewed and stable Respiratory status: spontaneous breathing and respiratory function stable Cardiovascular status: blood pressure returned to baseline and stable Postop Assessment: spinal receding Anesthetic complications: no   No notable events documented.  Last Vitals:  Vitals:   04/27/21 1137 04/27/21 1214  BP: 99/72 113/67  Pulse: 86 (!) 104  Resp: 18 18  Temp: 37.3 C 37.1 C  SpO2: 98%     Last Pain:  Vitals:   04/27/21 1208  TempSrc:   PainSc: 6                  Tiajuana Amass

## 2021-04-27 NOTE — Progress Notes (Signed)
Subjective: 3 Days Post-Op Procedure(s) (LRB): TOTAL HIP ARTHROPLASTY (Right) Patient reports pain as moderate.  Still c/o dizziness, hypotension, a bit tachycardic today, recheck of cbc hgb 7.6.  Objective: Vital signs in last 24 hours: Temp:  [98.5 F (36.9 C)-99.1 F (37.3 C)] 98.8 F (37.1 C) (09/19 1214) Pulse Rate:  [86-110] 104 (09/19 1214) Resp:  [17-18] 18 (09/19 1214) BP: (87-124)/(54-72) 113/67 (09/19 1214) SpO2:  [94 %-98 %] 98 % (09/19 1137)  Intake/Output from previous day: 09/18 0701 - 09/19 0700 In: 952.5 [P.O.:120; I.V.:832.5] Out: 1150 [Urine:1150] Intake/Output this shift: Total I/O In: 240 [P.O.:240] Out: -   Recent Labs    04/25/21 0307 04/27/21 0934  HGB 8.6* 7.6*   Recent Labs    04/25/21 0307 04/27/21 0934  WBC 11.1* 8.7  RBC 2.56* 2.22*  HCT 25.1* 21.7*  PLT 134* 130*   Recent Labs    04/25/21 0307  NA 134*  K 3.8  CL 101  CO2 24  BUN 18  CREATININE 1.02  GLUCOSE 140*  CALCIUM 8.6*   No results for input(s): LABPT, INR in the last 72 hours.  Neurovascular intact Sensation intact distally Intact pulses distally Incision: dressing C/D/I   Assessment/Plan: 3 Days Post-Op Procedure(s) (LRB): TOTAL HIP ARTHROPLASTY (Right) Up with therapy Plan for discharge tomorrow Discharge home with home health Transfuse 2 units PRBC and recheck CBC.    Anticipated LOS equal to or greater than 2 midnights due to - Age 17 and older with one or more of the following:  - Obesity  - Expected need for hospital services (PT, OT, Nursing) required for safe  discharge  - Anticipated need for postoperative skilled nursing care or inpatient rehab  - Active co-morbidities: Chronic pain requiring opiods, Diabetes, Stroke, and Anemia OR   - Unanticipated findings during/Post Surgery: Lab abnormalities and Slow post-op progression: GI, pain control, mobility  - Patient is a high risk of re-admission due to: Barriers to post-acute care  (logistical, no family support in home)   Chriss Czar 04/27/2021, 1:12 PM

## 2021-04-27 NOTE — Progress Notes (Signed)
Pt stated that he had his wife bring his cpap machine back home and does not want to wear one tonight.  Pt was advised that RT is available all night should he change his mind.

## 2021-04-28 ENCOUNTER — Inpatient Hospital Stay (HOSPITAL_COMMUNITY): Payer: Medicare HMO

## 2021-04-28 DIAGNOSIS — I951 Orthostatic hypotension: Secondary | ICD-10-CM

## 2021-04-28 LAB — COMPREHENSIVE METABOLIC PANEL
ALT: 131 U/L — ABNORMAL HIGH (ref 0–44)
AST: 188 U/L — ABNORMAL HIGH (ref 15–41)
Albumin: 3 g/dL — ABNORMAL LOW (ref 3.5–5.0)
Alkaline Phosphatase: 47 U/L (ref 38–126)
Anion gap: 7 (ref 5–15)
BUN: 18 mg/dL (ref 8–23)
CO2: 24 mmol/L (ref 22–32)
Calcium: 8.8 mg/dL — ABNORMAL LOW (ref 8.9–10.3)
Chloride: 106 mmol/L (ref 98–111)
Creatinine, Ser: 0.92 mg/dL (ref 0.61–1.24)
GFR, Estimated: >60 mL/min (ref >60)
Glucose, Bld: 160 mg/dL — ABNORMAL HIGH (ref 70–99)
Potassium: 4.1 mmol/L (ref 3.5–5.1)
Sodium: 137 mmol/L (ref 135–145)
Total Bilirubin: 0.8 mg/dL (ref 0.3–1.2)
Total Protein: 6.5 g/dL (ref 6.5–8.1)

## 2021-04-28 LAB — TYPE AND SCREEN
ABO/RH(D): B POS
Antibody Screen: NEGATIVE
Unit division: 0
Unit division: 0

## 2021-04-28 LAB — GLUCOSE, CAPILLARY
Glucose-Capillary: 135 mg/dL — ABNORMAL HIGH (ref 70–99)
Glucose-Capillary: 156 mg/dL — ABNORMAL HIGH (ref 70–99)
Glucose-Capillary: 98 mg/dL (ref 70–99)
Glucose-Capillary: 178 mg/dL — ABNORMAL HIGH (ref 70–99)

## 2021-04-28 LAB — HEMOGLOBIN AND HEMATOCRIT, BLOOD
HCT: 31.3 % — ABNORMAL LOW (ref 39.0–52.0)
Hemoglobin: 11 g/dL — ABNORMAL LOW (ref 13.0–17.0)

## 2021-04-28 LAB — BPAM RBC
Blood Product Expiration Date: 202210122359
Blood Product Expiration Date: 202210132359
ISSUE DATE / TIME: 202209191151
ISSUE DATE / TIME: 202209191601
Unit Type and Rh: 7300
Unit Type and Rh: 7300

## 2021-04-28 LAB — CBC
HCT: 27.6 % — ABNORMAL LOW (ref 39.0–52.0)
Hemoglobin: 9.9 g/dL — ABNORMAL LOW (ref 13.0–17.0)
MCH: 33.3 pg (ref 26.0–34.0)
MCHC: 35.9 g/dL (ref 30.0–36.0)
MCV: 92.9 fL (ref 80.0–100.0)
Platelets: 125 10*3/uL — ABNORMAL LOW (ref 150–400)
RBC: 2.97 MIL/uL — ABNORMAL LOW (ref 4.22–5.81)
RDW: 13.8 % (ref 11.5–15.5)
WBC: 7 10*3/uL (ref 4.0–10.5)
nRBC: 0 % (ref 0.0–0.2)

## 2021-04-28 LAB — LACTIC ACID, PLASMA
Lactic Acid, Venous: 1.2 mmol/L (ref 0.5–1.9)
Lactic Acid, Venous: 0.9 mmol/L (ref 0.5–1.9)

## 2021-04-28 MED ORDER — METOPROLOL TARTRATE 12.5 MG HALF TABLET
12.5000 mg | ORAL_TABLET | Freq: Once | ORAL | Status: AC
  Administered 2021-04-28: 12.5 mg via ORAL
  Filled 2021-04-28: qty 1

## 2021-04-28 MED ORDER — SODIUM CHLORIDE 0.9 % IV BOLUS
1000.0000 mL | Freq: Once | INTRAVENOUS | Status: AC
  Administered 2021-04-28: 1000 mL via INTRAVENOUS

## 2021-04-28 NOTE — Progress Notes (Signed)
Pt declined nocturnal cpap again tonight.  Pt was advised that RT is available all night should he change his mind.  RT will assist as needed.

## 2021-04-28 NOTE — H&P (Addendum)
Medical Consultation  Norman Hudson. ZOX:[id number] DOB: 1950-10-02 DOA: 04/24/2021 PCP: Shon Hale, MD   Requesting physician: Dr. Madelon Lips Date of consultation: 04/28/21 Reason for consultation: Hypotension  Impression/Recommendations Orthostatic hypotension Tachycardia     - orthostatics are positive     - hasn't had BP meds in at least 3 days     - follow up metabolic panel and lactic acid     - he denies any urinary symptoms, respiratory symptoms     - he has some minor temps, but that's to be expected post-op; WBC are within normal limits     - let's get him some fluids     - continue TEDs     - check EKG  UPDATE: Orthostatics improved after fluids. HR is still up. EKG shows a flutter. Spoke with cards. Will check echo. Add metoprolol PO 12.5 mg x 1. Rpt EKG after administration.  Acute blood loss anemia     - post surgery, he had a Hgb drop of 1 g. He was given 2 units of pRBCs and responded well; this AM Hgb is 9.9. Check an additional H&H  HTN     - hold BP regimen  DM2     - continue current regimen  HLD     - continue current regimen  Hypothyroidism     - continue current regimen  TR[facility] will follow-up again tomorrow. Please contact me if I can be of assistance in the meanwhile. Thank you for this consultation.  Chief Complaint: Hip pain  HPI:  Norman Hudson. is a 70 y.o. male with medical history significant of HTN, HLD, DM2, hypothyroidism, anxiety. Presenting with right hip pain and osteoarthritis of the right hip. Now s/p total hip replacement. He apparently had attempted greater than 3 months of conservative management of his hip pain w/ orthopedics. It was not successful. So, it was decided that surgical intervention was necessary. He completed surgery on 9/16. He's labs prior to admission on 9/8; showed an Hgb of 12.7. On 9/17, it dropped to 8.6 and then 7.6 on 9/19. He was given 2 units pRBCs. He has responded well as his  Hgb is 9.9 this morning. He denies any symptoms at rest. When he get up, he does have some lightheadedness. TRH was called for assistance with orthostatic hypotension.    Review of Systems:  Denies CP, dyspnea, palpitations, N/V/D. Remainder of ROS is negative for all not mentioned in HPI.  Past Medical History:  Diagnosis Date   Acne    Anxiety    takes Xanax daily as needed   Arthritis    History of kidney stones    Hyperlipidemia    takes Pravastatin daily   Hypertension    takes Amlodipine and Valsartan-HCTZ daily   Hypothyroidism    takes Synthroid daily   Inguinal hernia    left side   Internal hemorrhoids    Joint pain    Kidney stones    Pre-diabetes    Rosacea    TIA (transient ischemic attack) 06/2019   Past Surgical History:  Procedure Laterality Date   COLONOSCOPY     EXTRACORPOREAL SHOCK WAVE LITHOTRIPSY Right 12/27/2019   Procedure: EXTRACORPOREAL SHOCK WAVE LITHOTRIPSY (ESWL);  Surgeon: Malen Gauze, MD;  Location: [facility];  Service: Urology;  Laterality: Right;   LITHOTRIPSY  09/2015   LUMBAR LAMINECTOMY WITH COFLEX 1 LEVEL N/A 07/07/2015   Procedure: LUMBAR FOUR-FIVE LUMBAR LAMINECTOMY WITH COFLEX;  Surgeon:  Tia Alert, MD;  Location: [facility];  Service: Neurosurgery;  Laterality: N/A;   TONSILLECTOMY     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   Procedure: TOTAL HIP ARTHROPLASTY;  Surgeon: Frederico Hamman, MD;  Location: [facility];  Service: Orthopedics;  Laterality: Left;   TOTAL HIP ARTHROPLASTY Right 04/24/2021   Procedure: TOTAL HIP ARTHROPLASTY;  Surgeon: Frederico Hamman, MD;  Location: [facility];  Service: Orthopedics;  Laterality: Right;   Social History:  reports that he quit smoking about 32 years ago. His smoking use included cigarettes. He has a 15.00 pack-year smoking history. He has never used smokeless tobacco. He reports that he does not drink alcohol and does not use drugs.  Allergies  Allergen Reactions   Bactrim  [Sulfamethoxazole-Trimethoprim]     "threw me for a loop, hives, itching, extreme fatigue"   Family History  Problem Relation Age of Onset   Hypertension Father    Sleep disorder Father    CVA Father    Cancer Maternal Grandfather    Hypertension Paternal Grandmother    Diabetes Paternal Grandmother    Alcoholism Mother    Hypertension Sister    Aneurysm Sister    Atrial fibrillation Sister    Other Neg Hx        hypogonadism   Sleep apnea Neg Hx     Prior to Admission medications   Medication Sig Start Date End Date Taking? Authorizing Provider  acetaminophen (TYLENOL) 325 MG tablet Take 2 tablets (650 mg total) by mouth every 4 (four) hours as needed. 04/24/21 04/24/22 Yes Chadwell, Ivin Booty, PA-C  amLODipine (NORVASC) 5 MG tablet Take 5 mg by mouth 2 (two) times daily.   Yes [provider]  aspirin EC 81 MG tablet Take 1 tablet (81 mg total) by mouth 2 (two) times daily. TO PREVENT BLOOD CLOTS 04/24/21 05/24/21 Yes Chadwell, Ivin Booty, PA-C  carvedilol (COREG) 12.5 MG tablet TAKE (1) TABLET TWICE A DAY WITH MEALS (BREAKFAST AND SUPPER) Patient taking differently: Take 12.5 mg by mouth 2 (two) times daily with a meal. 03/19/21  Yes Tolia, Sunit, DO  cholecalciferol (VITAMIN D3) 25 MCG (1000 UT) tablet Take 1,000 Units by mouth daily.   Yes [provider]  hydrochlorothiazide (HYDRODIURIL) 25 MG tablet Take 25 mg by mouth daily.   Yes [provider]  levothyroxine (SYNTHROID, LEVOTHROID) 100 MCG tablet Take 100 mcg by mouth daily before breakfast.   Yes [provider]  metFORMIN (GLUCOPHAGE) 500 MG tablet Take 500 mg by mouth daily with breakfast.   Yes [provider]  Multiple Vitamins-Minerals (MULTIVITAMIN WITH MINERALS) tablet Take 1 tablet by mouth daily.   Yes [provider]  olmesartan (BENICAR) 40 MG tablet Take 40 mg by mouth daily.   Yes [provider]  oxyCODONE (OXY IR/ROXICODONE) 5 MG immediate release tablet  Take one tab po q4-6hrs prn pain 04/24/21  Yes Chadwell, Ivin Booty, PA-C  pyridOXINE (VITAMIN B-6) 100 MG tablet Take 100 mg by mouth daily.   Yes [provider]  rosuvastatin (CRESTOR) 20 MG tablet TAKE ONE TABLET AT BEDTIME. Patient taking differently: Take 20 mg by mouth at bedtime. 10/24/20  Yes Tolia, Sunit, DO  spironolactone (ALDACTONE) 25 MG tablet TAKE 1 TABLET ONCE DAILY. Patient taking differently: Take 25 mg by mouth daily. 03/09/21  Yes Tolia, Sunit, DO  tiZANidine (ZANAFLEX) 4 MG tablet Take 1 tablet (4 mg total) by mouth every 8 (eight) hours as needed for muscle spasms. 04/24/21 04/24/22 Yes Chadwell, Ivin Booty,  PA-C  valACYclovir (VALTREX) 1000 MG tablet Take 1,000 mg by mouth daily as needed (for herpes flare).    Yes [provider]  vitamin B-12 (CYANOCOBALAMIN) 500 MCG tablet Take 500 mcg by mouth daily.   Yes [provider]  ALPRAZolam Prudy Feeler) 0.5 MG tablet Take 0.5 mg by mouth at bedtime as needed for anxiety.    [provider]  clopidogrel (PLAVIX) 75 MG tablet TAKE 1 TABLET ONCE DAILY. Patient taking differently: Take 75 mg by mouth daily. 03/31/21   Tessa Lerner, DO   Physical Exam: Blood pressure 115/66, pulse (!) 108, temperature 98.2 F (36.8 C), resp. rate 16, height 5\' 6"  (1.676 m), weight 95.5 kg, SpO2 97 %. Vitals:   04/28/21 0601 04/28/21 1007  BP: 115/66   Pulse: (!) 108   Resp: 16   Temp: (!) 100.6 F (38.1 C) 98.2 F (36.8 C)  SpO2: 97%     General: 70 y.o. male resting in bed in NAD Eyes: PERRL, normal sclera ENMT: Nares patent w/o discharge, orophaynx clear, dentition normal, ears w/o discharge/lesions/ulcers Neck: Supple, trachea midline Cardiovascular: tachy, +S1, S2, no m/g/r, equal pulses throughout Respiratory: CTABL, no w/r/r, normal WOB GI: BS+, NDNT, no masses noted, no organomegaly noted MSK: No e/c/c; limited right hip ROM Skin: No rashes, bruises, ulcerations noted Neuro: A&O x 3, no focal deficits Psyc:  Appropriate interaction and affect, calm/cooperative  Labs on Admission:  Basic Metabolic Panel: Recent Labs  Lab 04/25/21 0307  NA 134*  K 3.8  CL 101  CO2 24  GLUCOSE 140*  BUN 18  CREATININE 1.02  CALCIUM 8.6*   Liver Function Tests: No results for input(s): AST, ALT, ALKPHOS, BILITOT, PROT, ALBUMIN in the last 168 hours. No results for input(s): LIPASE, AMYLASE in the last 168 hours. No results for input(s): AMMONIA in the last 168 hours. CBC: Recent Labs  Lab 04/25/21 0307 04/27/21 0934 04/28/21 0315  WBC 11.1* 8.7 7.0  NEUTROABS 7.9* 6.6  --   HGB 8.6* 7.6* 9.9*  HCT 25.1* 21.7* 27.6*  MCV 98.0 97.7 92.9  PLT 134* 130* 125*   Cardiac Enzymes: No results for input(s): CKTOTAL, CKMB, CKMBINDEX, TROPONINI in the last 168 hours. BNP: Invalid input(s): POCBNP CBG: Recent Labs  Lab 04/27/21 0722 04/27/21 1153 04/27/21 1650 04/27/21 2213 04/28/21 0801  GLUCAP 138* 131* 110* 163* 135*    Radiological Exams on Admission: No results found.  EKG: Pending  Status is: Inpatient  Time spent: 45 minutes  Elsia Lasota A Erby Sanderson DO [facility]  If 7PM-7AM, please contact night-coverage [email or web address] 04/28/2021, 12:02 PM

## 2021-04-28 NOTE — Plan of Care (Signed)
Plan of care reviewed and discussed with the patient. 

## 2021-04-28 NOTE — Discharge Summary (Deleted)
PATIENT ID: Norman Hudson.        MRN:  [id number]          DOB/AGE: Nov 04, 1950 / 70 y.o.    DISCHARGE SUMMARY  ADMISSION DATE:    04/24/2021 DISCHARGE DATE:   04/28/2021   ADMISSION DIAGNOSIS: Degenerative joint disease of right hip [M16.11]    DISCHARGE DIAGNOSIS:  OA RIGHT HIP    ADDITIONAL DIAGNOSIS: Active Problems:   Degenerative joint disease of right hip   Postoperative anemia due to acute blood loss  Past Medical History:  Diagnosis Date   Acne    Anxiety    takes Xanax daily as needed   Arthritis    History of kidney stones    Hyperlipidemia    takes Pravastatin daily   Hypertension    takes Amlodipine and Valsartan-HCTZ daily   Hypothyroidism    takes Synthroid daily   Inguinal hernia    left side   Internal hemorrhoids    Joint pain    Kidney stones    Pre-diabetes    Rosacea    TIA (transient ischemic attack) 06/2019    PROCEDURE: Procedure(s): TOTAL HIP ARTHROPLASTY Right on 04/24/2021  CONSULTS: none    HISTORY:  See H&P in chart  HOSPITAL COURSE:  Roben Nikolas. is a 71 y.o. admitted on 04/24/2021 and found to have a diagnosis of OA RIGHT HIP.  After appropriate laboratory studies were obtained  they were taken to the operating room on 04/24/2021 and underwent  Procedure(s): TOTAL HIP ARTHROPLASTY  Right.   They were given perioperative antibiotics:  Anti-infectives (From admission, onward)    Start     Dose/Rate Route Frequency Ordered Stop   04/24/21 0600  ceFAZolin (ANCEF) IVPB 2g/100 mL premix        2 g 200 mL/hr over 30 Minutes Intravenous On call to O.R. 04/24/21 1191 04/24/21 0816     .  Tolerated the procedure well.  Placed with a foley intraoperatively.     POD #1, allowed out of bed to a chair.  PT for ambulation and exercise program.  Foley D/C'd in morning.  Slow to progress with PT, c/o dizziness, light headedness secondary to ABLA.  POD #2, continued PT and ambulation. Orthostatic hypotension,  dizziness, slow to progress with PT due to this.  POD#3, Hgb 7.6 symptomatic ABLA, transfused 2 units PRBC with improved symptoms.    The remainder of the hospital course was dedicated to ambulation and strengthening.   The patient was discharged on 4 Days Post-Op in  Stable condition.  Blood products given: 2 units CC PRBC  DIAGNOSTIC STUDIES: Recent vital signs: Patient Vitals for the past 24 hrs:  BP Temp Temp src Pulse Resp SpO2  04/28/21 0601 115/66 (!) 100.6 F (38.1 C) Oral (!) 108 16 97 %  04/27/21 2217 117/75 (!) 100.4 F (38 C) Oral (!) 109 16 99 %  04/27/21 1956 107/68 (!) 100.5 F (38.1 C) Oral (!) 109 16 96 %  04/27/21 1620 98/66 99.1 F (37.3 C) Oral (!) 105 16 95 %  04/27/21 1555 104/60 99 F (37.2 C) Oral (!) 105 16 97 %  04/27/21 1507 116/68 98.4 F (36.9 C) -- (!) 106 16 --  04/27/21 1413 110/70 98.5 F (36.9 C) Oral (!) 108 18 97 %  04/27/21 1214 113/67 98.8 F (37.1 C) -- (!) 104 18 --  04/27/21 1137 99/72 99.1 F (37.3 C) Oral 86 18 98 %  Recent laboratory studies: Recent Labs    04/25/21 0307 04/27/21 0934 04/28/21 0315  WBC 11.1* 8.7 7.0  HGB 8.6* 7.6* 9.9*  HCT 25.1* 21.7* 27.6*  PLT 134* 130* 125*   Recent Labs    04/25/21 0307  NA 134*  K 3.8  CL 101  CO2 24  BUN 18  CREATININE 1.02  GLUCOSE 140*  CALCIUM 8.6*   Lab Results  Component Value Date   INR 1.1 04/16/2021   INR 1.0 07/08/2019   INR 1.14 07/12/2016     Recent Radiographic Studies :  DG Pelvis Portable  Result Date: 04/24/2021 CLINICAL DATA:  Status post right hip total arthroplasty EXAM: PORTABLE PELVIS 1-2 VIEWS; DG HIP (WITH OR WITHOUT PELVIS) 1V PORT RIGHT COMPARISON:  None. FINDINGS: Status post right hip total arthroplasty with expected overlying postoperative change. No evidence of perihardware fracture or component loosening. Additionally status post remote left hip total arthroplasty. The bony pelvis is unremarkable in single frontal view. Nonobstructive  pattern of overlying bowel gas. IMPRESSION: Status post right hip total arthroplasty with expected overlying postoperative change. No evidence of perihardware fracture or component loosening. Electronically Signed   By: Lauralyn Primes M.D.   On: 04/24/2021 13:35   DG Hip Port Unilat With Pelvis 1V Right  Result Date: 04/24/2021 CLINICAL DATA:  Status post right hip total arthroplasty EXAM: PORTABLE PELVIS 1-2 VIEWS; DG HIP (WITH OR WITHOUT PELVIS) 1V PORT RIGHT COMPARISON:  None. FINDINGS: Status post right hip total arthroplasty with expected overlying postoperative change. No evidence of perihardware fracture or component loosening. Additionally status post remote left hip total arthroplasty. The bony pelvis is unremarkable in single frontal view. Nonobstructive pattern of overlying bowel gas. IMPRESSION: Status post right hip total arthroplasty with expected overlying postoperative change. No evidence of perihardware fracture or component loosening. Electronically Signed   By: Lauralyn Primes M.D.   On: 04/24/2021 13:35    DISCHARGE INSTRUCTIONS: Discharge Instructions     Face-to-face encounter (required for Medicare/Medicaid patients)   Complete by: As directed    I Margart Sickles certify that this patient is under my care and that I, or a nurse practitioner or physician's assistant working with me, had a face-to-face encounter that meets the physician face-to-face encounter requirements with this patient on 04/24/2021. The encounter with the patient was in whole, or in part for the following medical condition(s) which is the primary reason for home health care (List medical condition): R total hip   The encounter with the patient was in whole, or in part, for the following medical condition, which is the primary reason for home health care: s/p R total hip posterior   I certify that, based on my findings, the following services are medically necessary home health services: Physical therapy   Reason  for Medically Necessary Home Health Services: Therapy- Therapeutic Exercises to Increase Strength and Endurance   My clinical findings support the need for the above services: Pain interferes with ambulation/mobility   Further, I certify that my clinical findings support that this patient is homebound due to: Unable to leave home safely without assistance   Home Health   Complete by: As directed    To provide the following care/treatments: PT       DISCHARGE MEDICATIONS:   Allergies as of 04/28/2021       Reactions   Bactrim [sulfamethoxazole-trimethoprim]    "threw me for a loop, hives, itching, extreme fatigue"        Medication List  TAKE these medications    acetaminophen 325 MG tablet Commonly known as: Tylenol Take 2 tablets (650 mg total) by mouth every 4 (four) hours as needed.   ALPRAZolam 0.5 MG tablet Commonly known as: XANAX Take 0.5 mg by mouth at bedtime as needed for anxiety.   amLODipine 5 MG tablet Commonly known as: NORVASC Take 5 mg by mouth 2 (two) times daily.   aspirin EC 81 MG tablet Take 1 tablet (81 mg total) by mouth 2 (two) times daily. TO PREVENT BLOOD CLOTS   carvedilol 12.5 MG tablet Commonly known as: COREG TAKE (1) TABLET TWICE A DAY WITH MEALS (BREAKFAST AND SUPPER) What changed: See the new instructions.   cholecalciferol 25 MCG (1000 UNIT) tablet Commonly known as: VITAMIN D3 Take 1,000 Units by mouth daily.   clopidogrel 75 MG tablet Commonly known as: PLAVIX TAKE 1 TABLET ONCE DAILY.   hydrochlorothiazide 25 MG tablet Commonly known as: HYDRODIURIL Take 25 mg by mouth daily.   levothyroxine 100 MCG tablet Commonly known as: SYNTHROID Take 100 mcg by mouth daily before breakfast.   metFORMIN 500 MG tablet Commonly known as: GLUCOPHAGE Take 500 mg by mouth daily with breakfast.   multivitamin with minerals tablet Take 1 tablet by mouth daily.   olmesartan 40 MG tablet Commonly known as: BENICAR Take 40 mg by  mouth daily.   oxyCODONE 5 MG immediate release tablet Commonly known as: Oxy IR/ROXICODONE Take one tab po q4-6hrs prn pain   pyridOXINE 100 MG tablet Commonly known as: VITAMIN B-6 Take 100 mg by mouth daily.   rosuvastatin 20 MG tablet Commonly known as: CRESTOR TAKE ONE TABLET AT BEDTIME.   spironolactone 25 MG tablet Commonly known as: ALDACTONE TAKE 1 TABLET ONCE DAILY.   tiZANidine 4 MG tablet Commonly known as: Zanaflex Take 1 tablet (4 mg total) by mouth every 8 (eight) hours as needed for muscle spasms.   valACYclovir 1000 MG tablet Commonly known as: VALTREX Take 1,000 mg by mouth daily as needed (for herpes flare).   vitamin B-12 500 MCG tablet Commonly known as: CYANOCOBALAMIN Take 500 mcg by mouth daily.        FOLLOW UP VISIT:    Follow-up Information     Frederico Hamman, MD. Schedule an appointment as soon as possible for a visit in 2 week(s).   Specialty: Orthopedic Surgery Why: or as previously scheduled Contact information: [address] [place] [address] [phone]         [facility] Follow up.   Specialty: Home Health Services Why: agency will provide home health physical therapy Contact information: [address] [place] [address] [phone]                 DISPOSITION:   Home  CONDITION:  Stable  Margart Sickles, PA-C  04/28/2021 7:59 AM

## 2021-04-28 NOTE — Progress Notes (Signed)
Physical Therapy Treatment Patient Details Name: Norman Hudson. MRN: [id number] DOB: May 27, 1951 Today's Date: 04/28/2021   History of Present Illness Pt s/p R THR by posterior approach and with hx of L THR (17), lumbar lami (17) and TIA.    PT Comments    POD # 4 am session Assisted with amb in hallway while performing Orthostatic vitals.  Supine      BP 111/71  HR 96 EOB          BP 114/72 HR 100 Standing   BP  96/60 HR 112 3 min        BP  85/58 HR 119 (no dizziness but feels "weary"  (amb 24 feet) Returned to room via recliner.  Reported to RN.   Recommendations for follow up therapy are one component of a multi-disciplinary discharge planning process, led by the attending physician.  Recommendations may be updated based on patient status, additional functional criteria and insurance authorization.  Follow Up Recommendations  Home health PT     Equipment Recommendations  None recommended by PT    Recommendations for Other Services       Precautions / Restrictions Precautions Precautions: Posterior Hip;Fall Precaution Booklet Issued: Yes (comment) Precaution Comments: Pt recalls all THP with increased time Knee Immobilizer - Right: Other (comment) (using pillows for THP while in bed) Restrictions Weight Bearing Restrictions: No RLE Weight Bearing: Weight bearing as tolerated     Mobility  Bed Mobility Overal bed mobility: Needs Assistance Bed Mobility: Supine to Sit           General bed mobility comments: demonstrated and instructed how to use a belt to self assist LE OOB.  Self able with increased time.    Transfers Overall transfer level: Needs assistance Equipment used: Rolling walker (2 wheeled) Transfers: Sit to/from Stand Sit to Stand: Min guard;Min assist         General transfer comment: 25% VC's on proper hand placement and to avoid hip flex > 90 degrees.  Also 25% VC's to extend LE prior to  sit/stand.  Ambulation/Gait Ambulation/Gait assistance: Min assist;Min guard Gait Distance (Feet): 24 Feet Assistive device: Rolling walker (2 wheeled) Gait Pattern/deviations: Step-to pattern;Decreased step length - right;Decreased step length - left;Shuffle;Trunk flexed Gait velocity: decreased   General Gait Details: distance limited by Orthostatic BP's. (see prior note)   Stairs             Wheelchair Mobility    Modified Rankin (Stroke Patients Only)       Balance                                            Cognition Arousal/Alertness: Awake/alert Behavior During Therapy: WFL for tasks assessed/performed Overall Cognitive Status: Within Functional Limits for tasks assessed                                 General Comments: AxO x 3 very motivated/pleasant      Exercises      General Comments        Pertinent Vitals/Pain Pain Assessment: 0-10 Pain Score: 4  Pain Location: R hip Pain Descriptors / Indicators: Grimacing;Operative site guarding;Tender;Tightness Pain Intervention(s): Monitored during session;Premedicated before session;Repositioned;Ice applied    Home Living  Prior Function            PT Goals (current goals can now be found in the care plan section) Progress towards PT goals: Progressing toward goals    Frequency    7X/week      PT Plan Current plan remains appropriate    Co-evaluation              AM-PAC PT "6 Clicks" Mobility   Outcome Measure  Help needed turning from your back to your side while in a flat bed without using bedrails?: A Little Help needed moving from lying on your back to sitting on the side of a flat bed without using bedrails?: A Little Help needed moving to and from a bed to a chair (including a wheelchair)?: A Little Help needed standing up from a chair using your arms (e.g., wheelchair or bedside chair)?: A Little Help needed to  walk in hospital room?: A Little Help needed climbing 3-5 steps with a railing? : A Little 6 Click Score: 18    End of Session Equipment Utilized During Treatment: Gait belt Activity Tolerance: Patient limited by fatigue;Other (comment) (hypotension) Patient left: in chair;with call bell/phone within reach Nurse Communication: Mobility status PT Visit Diagnosis: Unsteadiness on feet (R26.81);Difficulty in walking, not elsewhere classified (R26.2);Muscle weakness (generalized) (M62.81)     Time: 0940-1010 PT Time Calculation (min) (ACUTE ONLY): 30 min  Charges:  $Gait Training: 8-22 mins $Therapeutic Activity: 8-22 mins                     {Kiriana Worthington  PTA Acute  Rehabilitation Services Pager      [phone] Office      [phone]

## 2021-04-28 NOTE — Progress Notes (Signed)
Physical Therapy Treatment Patient Details Name: Norman Hudson. 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    PT Comments    POD #4 pm session after Bolus Assisted with amb while monitoring BP's.  MUCH IMPROVED. Supine    BP 120/80  HR 94 Sitting     BP 122/82 HR 109 Standing BP 116/81 HR 118 3 min       BP 114/73 HR 112 (amb 75 feet) Feels MUCH BETTER.  Assisted back to bed and applied ICE.  Pt plans to D/C to home tomorrow.   Recommendations for follow up therapy are one component of a multi-disciplinary discharge planning process, led by the attending physician.  Recommendations may be updated based on patient status, additional functional criteria and insurance authorization.  Follow Up Recommendations  Home health PT     Equipment Recommendations  None recommended by PT    Recommendations for Other Services       Precautions / Restrictions Precautions Precautions: Posterior Hip;Fall Precaution Booklet Issued: Yes (comment) Precaution Comments: Pt recalls all THP with increased time Knee Immobilizer - Right: Other (comment) (using pillows for THP while in bed) Restrictions Weight Bearing Restrictions: No RLE Weight Bearing: Weight bearing as tolerated     Mobility  Bed Mobility Overal bed mobility: Needs Assistance Bed Mobility: Sit to Supine       Sit to supine: Mod assist   General bed mobility comments: Mod Assist + t back to bed then + 2 to scoot to [facility]    Transfers Overall transfer level: Needs assistance Equipment used: Rolling walker (2 wheeled) Transfers: Sit to/from Stand Sit to Stand: Min guard;Min assist         General transfer comment: 25% VC's on proper hand placement and to avoid hip flex > 90 degrees.  Also 25% VC's to extend LE prior to sit/stand.  Ambulation/Gait Ambulation/Gait  assistance: Supervision;Min guard Gait Distance (Feet): 75 Feet Assistive device: Rolling walker (2 wheeled) Gait Pattern/deviations: Step-to pattern;Decreased step length - right;Decreased step length - left;Shuffle;Trunk flexed Gait velocity: decreased   General Gait Details: MUCH IMPROVED gait tolerance/distance   Stairs             Wheelchair Mobility    Modified Rankin (Stroke Patients Only)       Balance                                            Cognition Arousal/Alertness: Awake/alert Behavior During Therapy: WFL for tasks assessed/performed Overall Cognitive Status: Within Functional Limits for tasks assessed                                 General Comments: AxO x 3 very motivated/pleasant      Exercises      General Comments        Pertinent Vitals/Pain Pain Assessment: 0-10 Pain Score: 6  Pain Location: R hip Pain Descriptors / Indicators: Grimacing;Operative site guarding;Tender;Tightness Pain Intervention(s): Monitored during session;Repositioned;Ice applied;Premedicated before session    Home Living                      Prior Function  PT Goals (current goals can now be found in the care plan section) Progress towards PT goals: Progressing toward goals    Frequency    7X/week      PT Plan Current plan remains appropriate    Co-evaluation              AM-PAC PT "6 Clicks" Mobility   Outcome Measure  Help needed turning from your back to your side while in a flat bed without using bedrails?: A Little Help needed moving from lying on your back to sitting on the side of a flat bed without using bedrails?: A Little Help needed moving to and from a bed to a chair (including a wheelchair)?: A Little Help needed standing up from a chair using your arms (e.g., wheelchair or bedside chair)?: A Little Help needed to walk in hospital room?: A Little Help needed climbing 3-5 steps  with a railing? : A Little 6 Click Score: 18    End of Session Equipment Utilized During Treatment: Gait belt Activity Tolerance: Patient tolerated treatment well Patient left: in bed;with call bell/phone within reach Nurse Communication: Mobility status PT Visit Diagnosis: Unsteadiness on feet (R26.81);Difficulty in walking, not elsewhere classified (R26.2);Muscle weakness (generalized) (M62.81)     Time: 1545-1610 PT Time Calculation (min) (ACUTE ONLY): 25 min  Charges:  $Gait Training: 8-22 mins $Therapeutic Activity: 8-22 mins                     Felecia Shelling  PTA Acute  Rehabilitation Services Pager      [phone] Office      [phone]

## 2021-04-28 NOTE — Progress Notes (Signed)
Subjective: 4 Days Post-Op Procedure(s) (LRB): TOTAL HIP ARTHROPLASTY (Right) Patient reports pain as mild and moderate.  Dizziness improved, Hgb 9.9 this AM.  Still orthostatic and tachycardic with PT today.  Objective: Vital signs in last 24 hours: Temp:  [98.2 F (36.8 C)-100.6 F (38.1 C)] 98.2 F (36.8 C) (09/20 1007) Pulse Rate:  [86-109] 108 (09/20 0601) Resp:  [16-18] 16 (09/20 0601) BP: (98-117)/(60-75) 115/66 (09/20 0601) SpO2:  [95 %-99 %] 97 % (09/20 0601)  Intake/Output from previous day: 09/19 0701 - 09/20 0700 In: 1811.3 [P.O.:480; I.V.:620.6; Blood:710.7] Out: 1475 [Urine:1475] Intake/Output this shift: Total I/O In: 84.2 [I.V.:84.2] Out: 400 [Urine:400]  Recent Labs    04/27/21 0934 04/28/21 0315  HGB 7.6* 9.9*   Recent Labs    04/27/21 0934 04/28/21 0315  WBC 8.7 7.0  RBC 2.22* 2.97*  HCT 21.7* 27.6*  PLT 130* 125*   No results for input(s): NA, K, CL, CO2, BUN, CREATININE, GLUCOSE, CALCIUM in the last 72 hours. No results for input(s): LABPT, INR in the last 72 hours.      Assessment/Plan: 4 Days Post-Op Procedure(s) (LRB): TOTAL HIP ARTHROPLASTY (Right) Up with therapy Encourage fluids Will consult medicine for recommendations re orthostatic hypotension, tachycardia, not currently taking home BP meds. Will await evaluation and recommendations prior to possible discharge   Chriss Czar 04/28/2021, 11:00 AM

## 2021-04-28 NOTE — Progress Notes (Addendum)
PHYSICAL THERAPY  Assisted OOB to take Orthostatics:  Supine      BP 111/71  HR 96 EOB          BP 114/72 HR 100 Standing   BP  96/60 HR 112 3 min        BP  85/58 HR 119 (no dizziness but feels "weary"  (amb 24 feet)  Assisted to recliner and encouraged fluids.  Notified RN.  Will see pt again this afternoon for poss D/C to home after.  Rica Koyanagi  PTA Acute  Rehabilitation Services Pager      [phone] Office      [phone]

## 2021-04-29 ENCOUNTER — Inpatient Hospital Stay (HOSPITAL_COMMUNITY): Payer: Medicare HMO

## 2021-04-29 DIAGNOSIS — D62 Acute posthemorrhagic anemia: Secondary | ICD-10-CM | POA: Diagnosis not present

## 2021-04-29 DIAGNOSIS — M1611 Unilateral primary osteoarthritis, right hip: Principal | ICD-10-CM

## 2021-04-29 DIAGNOSIS — I4892 Unspecified atrial flutter: Secondary | ICD-10-CM

## 2021-04-29 DIAGNOSIS — Z96641 Presence of right artificial hip joint: Secondary | ICD-10-CM

## 2021-04-29 DIAGNOSIS — R7989 Other specified abnormal findings of blood chemistry: Secondary | ICD-10-CM | POA: Diagnosis not present

## 2021-04-29 LAB — HEPATITIS PANEL, ACUTE
HCV Ab: NONREACTIVE
Hep A IgM: NONREACTIVE
Hep B C IgM: NONREACTIVE
Hepatitis B Surface Ag: NONREACTIVE

## 2021-04-29 LAB — COMPREHENSIVE METABOLIC PANEL
ALT: 174 U/L — ABNORMAL HIGH (ref 0–44)
AST: 150 U/L — ABNORMAL HIGH (ref 15–41)
Albumin: 2.9 g/dL — ABNORMAL LOW (ref 3.5–5.0)
Alkaline Phosphatase: 47 U/L (ref 38–126)
Anion gap: 6 (ref 5–15)
BUN: 15 mg/dL (ref 8–23)
CO2: 24 mmol/L (ref 22–32)
Calcium: 8.6 mg/dL — ABNORMAL LOW (ref 8.9–10.3)
Chloride: 109 mmol/L (ref 98–111)
Creatinine, Ser: 0.87 mg/dL (ref 0.61–1.24)
GFR, Estimated: >60 mL/min (ref >60)
Glucose, Bld: 180 mg/dL — ABNORMAL HIGH (ref 70–99)
Potassium: 4.2 mmol/L (ref 3.5–5.1)
Sodium: 139 mmol/L (ref 135–145)
Total Bilirubin: 1 mg/dL (ref 0.3–1.2)
Total Protein: 6.5 g/dL (ref 6.5–8.1)

## 2021-04-29 LAB — CBC
HCT: 32.1 % — ABNORMAL LOW (ref 39.0–52.0)
Hemoglobin: 11.1 g/dL — ABNORMAL LOW (ref 13.0–17.0)
MCH: 32.9 pg (ref 26.0–34.0)
MCHC: 34.6 g/dL (ref 30.0–36.0)
MCV: 95.3 fL (ref 80.0–100.0)
Platelets: 159 10*3/uL (ref 150–400)
RBC: 3.37 MIL/uL — ABNORMAL LOW (ref 4.22–5.81)
RDW: 13.8 % (ref 11.5–15.5)
WBC: 6.3 10*3/uL (ref 4.0–10.5)
nRBC: 0 % (ref 0.0–0.2)

## 2021-04-29 LAB — GLUCOSE, CAPILLARY
Glucose-Capillary: 133 mg/dL — ABNORMAL HIGH (ref 70–99)
Glucose-Capillary: 154 mg/dL — ABNORMAL HIGH (ref 70–99)
Glucose-Capillary: 143 mg/dL — ABNORMAL HIGH (ref 70–99)
Glucose-Capillary: 129 mg/dL — ABNORMAL HIGH (ref 70–99)

## 2021-04-29 LAB — ECHOCARDIOGRAM COMPLETE
Area-P 1/2: 6.43 cm2
Height: 66 in
S' Lateral: 3.6 cm
Single Plane A4C EF: 64.9 %
Weight: 3368.63 oz

## 2021-04-29 LAB — HEPARIN LEVEL (UNFRACTIONATED): Heparin Unfractionated: <0.1 IU/mL — ABNORMAL LOW (ref 0.30–0.70)

## 2021-04-29 MED ORDER — METOPROLOL TARTRATE 12.5 MG HALF TABLET
12.5000 mg | ORAL_TABLET | Freq: Two times a day (BID) | ORAL | Status: DC
  Administered 2021-04-29 – 2021-04-30 (×3): 12.5 mg via ORAL
  Filled 2021-04-29 (×3): qty 1

## 2021-04-29 MED ORDER — HYDROCHLOROTHIAZIDE 25 MG PO TABS
12.5000 mg | ORAL_TABLET | Freq: Every day | ORAL | Status: DC
  Administered 2021-04-30: 12.5 mg via ORAL
  Filled 2021-04-29: qty 1

## 2021-04-29 MED ORDER — HEPARIN (PORCINE) 25000 UT/250ML-% IV SOLN
1500.0000 [IU]/h | INTRAVENOUS | Status: DC
  Administered 2021-04-29: 1250 [IU]/h via INTRAVENOUS
  Filled 2021-04-29 (×2): qty 250

## 2021-04-29 MED ORDER — SPIRONOLACTONE 12.5 MG HALF TABLET
12.5000 mg | ORAL_TABLET | Freq: Every day | ORAL | Status: DC
  Administered 2021-04-30: 12.5 mg via ORAL
  Filled 2021-04-29: qty 1

## 2021-04-29 NOTE — Progress Notes (Signed)
Subjective: 5 Days Post-Op Procedure(s) (LRB): TOTAL HIP ARTHROPLASTY (Right) Patient reports pain as well controlled  Objective: Vital signs in last 24 hours: Temp:  [98.1 F (36.7 C)-99.5 F (37.5 C)] 98.1 F (36.7 C) (09/21 1303) Pulse Rate:  [91-97] 97 (09/21 1303) Resp:  [16-18] 18 (09/21 1303) BP: (116-120)/(67-76) 116/76 (09/21 1303) SpO2:  [98 %-100 %] 100 % (09/21 1303)  Intake/Output from previous day: 09/20 0701 - 09/21 0700 In: 2401.7 [P.O.:360; I.V.:966.4; IV Piggyback:1075.2] Out: 2225 [Urine:2225] Intake/Output this shift: Total I/O In: 240 [P.O.:240] Out: 150 [Urine:150]  Recent Labs    04/27/21 0934 04/28/21 0315 04/28/21 1223 04/29/21 0847  HGB 7.6* 9.9* 11.0* 11.1*   Recent Labs    04/28/21 0315 04/28/21 1223 04/29/21 0847  WBC 7.0  --  6.3  RBC 2.97*  --  3.37*  HCT 27.6* 31.3* 32.1*  PLT 125*  --  159   Recent Labs    04/28/21 1223 04/29/21 0847  NA 137 139  K 4.1 4.2  CL 106 109  CO2 24 24  BUN 18 15  CREATININE 0.92 0.87  GLUCOSE 160* 180*  CALCIUM 8.8* 8.6*   No results for input(s): LABPT, INR in the last 72 hours.  Echo was being performed upon entering room.   Assessment/Plan: 5 Days Post-Op Procedure(s) (LRB): TOTAL HIP ARTHROPLASTY (Right) Pain control as ordered Ok for eliquis with d/c from [place] for d/c home tomorrow if remains stable per med/cards Wbat RLE, post hip precautions     Chriss Czar 04/29/2021, 4:34 PM

## 2021-04-29 NOTE — Progress Notes (Signed)
  Echocardiogram 2D Echocardiogram has been performed.  Norman Hudson 04/29/2021, 11:57 AM

## 2021-04-29 NOTE — Progress Notes (Addendum)
ANTICOAGULATION CONSULT NOTE - Initial Consult  Pharmacy Consult for IV heparin Indication: atrial flutter  Allergies  Allergen Reactions   Bactrim [Sulfamethoxazole-Trimethoprim]     "threw me for a loop, hives, itching, extreme fatigue"    Patient Measurements: Height: 5\' 6"  (167.6 cm) Weight: 95.5 kg (210 lb 8.6 oz) IBW/kg (Calculated) : 63.8 Heparin Dosing Weight: 84.5 kg  Vital Signs: Temp: 99.2 F (37.3 C) (09/21 0900) Temp Source: Oral (09/21 0900) BP: 119/75 (09/21 0525) Pulse Rate: 96 (09/21 0525)  Labs: Recent Labs    04/27/21 0934 04/28/21 0315 04/28/21 1223 04/29/21 0847  HGB 7.6* 9.9* 11.0* 11.1*  HCT 21.7* 27.6* 31.3* 32.1*  PLT 130* 125*  --  159  CREATININE  --   --  0.92 0.87    Estimated Creatinine Clearance: 84.3 mL/min (by C-G formula based on SCr of 0.87 mg/dL).   Medical History: Past Medical History:  Diagnosis Date   Acne    Anxiety    takes Xanax daily as needed   Arthritis    History of kidney stones    Hyperlipidemia    takes Pravastatin daily   Hypertension    takes Amlodipine and Valsartan-HCTZ daily   Hypothyroidism    takes Synthroid daily   Inguinal hernia    left side   Internal hemorrhoids    Joint pain    Kidney stones    Pre-diabetes    Rosacea    TIA (transient ischemic attack) 06/2019    Assessment: 70 y/o M with PMH of HTN, HLD, DM2, hypothyroidism, anxiety who presented with right hip pain and osteoarthritis of the right hip, s/p R THA on 04/24/21. Patient did have acute blood loss anemia from surgery and was transfused 2 units PRBCs. Hgb has improved to 11.1 today. Pltc WNL. Pharmacy consulted today for IV heparin dosing for atrial flutter. Patient was on Aspirin 81mg  PO BID post-operatively for DVT prophylaxis, which has been discontinued by MD. Patient not on any anticoagulants PTA.   Goal of Therapy:  Heparin level 0.3-0.7 units/ml Monitor platelets by anticoagulation protocol: Yes   Plan:  Start  heparin infusion at 1250 units/hr (no bolus per MD request) Heparin level 8 hours after initiation Daily CBC, heparin level Monitor closely for s/sx of bleeding   Lindell Spar, PharmD, BCPS Clinical Pharmacist  04/29/2021,12:04 PM

## 2021-04-29 NOTE — Progress Notes (Signed)
Physical Therapy Treatment Patient Details Name: Norman Hudson. 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    PT Comments    POD # 5  Extended LOS with issues of anemia, hypotension and now A Flutter.   Pt is progressing with his mobility.  Assisted OOB.  General bed mobility comments: with increased time and use of belt, pt able to to transition to EOB with much effort.  General transfer comment: 25% VC's on proper hand placement and to avoid hip flex > 90 degrees.  Also 25% VC's to extend LE prior to sit/stand.  Increased self ability. General Gait Details: continues to improve both distance and ability.  HR did increase from 114 at rest to 135 with activity.  Pt tolerated well with only mind c/o fatigue.  BP's are better.   Supine  BP 130/83  HR 114 3 min     BP 111/82  HR 125 Pt plans to D/C to home when medically stable.  Recommendations for follow up therapy are one component of a multi-disciplinary discharge planning process, led by the attending physician.  Recommendations may be updated based on patient status, additional functional criteria and insurance authorization.  Follow Up Recommendations  Home health PT     Equipment Recommendations  None recommended by PT    Recommendations for Other Services       Precautions / Restrictions Precautions Precautions: Posterior Hip;Fall Precaution Booklet Issued: Yes (comment) Precaution Comments: Pt recalls all THP with increased time Restrictions Weight Bearing Restrictions: No RLE Weight Bearing: Weight bearing as tolerated     Mobility  Bed Mobility Overal bed mobility: Needs Assistance       Supine to sit: Min assist     General bed mobility comments: with increased time and use of belt, pt able to to transition to EOB with much effort    Transfers Overall transfer  level: Needs assistance Equipment used: Rolling walker (2 wheeled) Transfers: Sit to/from [employer] Sit to Stand: Supervision Stand pivot transfers: Supervision;Min guard       General transfer comment: 25% VC's on proper hand placement and to avoid hip flex > 90 degrees.  Also 25% VC's to extend LE prior to sit/stand.  Increased self ability.  Ambulation/Gait Ambulation/Gait assistance: Supervision Gait Distance (Feet): 115 Feet Assistive device: Rolling walker (2 wheeled) Gait Pattern/deviations: Step-to pattern;Decreased step length - right;Decreased step length - left;Shuffle;Trunk flexed Gait velocity: decreased   General Gait Details: continues to improve both distance and ability.  HR did increase from 114 at rest to 135 with activity.  Pt tolerated well with only mind c/o fatigue.   Stairs             Wheelchair Mobility    Modified Rankin (Stroke Patients Only)       Balance                                            Cognition Arousal/Alertness: Awake/alert Behavior During Therapy: WFL for tasks assessed/performed Overall Cognitive Status: Within Functional Limits for tasks assessed  General Comments: AxO x 3 very motivated/pleasant      Exercises      General Comments        Pertinent Vitals/Pain Pain Assessment: 0-10 Pain Score: 3  Pain Location: R hip Pain Descriptors / Indicators: Grimacing;Operative site guarding;Tender;Tightness Pain Intervention(s): Monitored during session;Premedicated before session;Repositioned;Ice applied    Home Living                      Prior Function            PT Goals (current goals can now be found in the care plan section) Progress towards PT goals: Progressing toward goals    Frequency    7X/week      PT Plan Current plan remains appropriate    Co-evaluation              AM-PAC PT "6 Clicks"  Mobility   Outcome Measure  Help needed turning from your back to your side while in a flat bed without using bedrails?: A Little Help needed moving from lying on your back to sitting on the side of a flat bed without using bedrails?: A Little Help needed moving to and from a bed to a chair (including a wheelchair)?: A Little Help needed standing up from a chair using your arms (e.g., wheelchair or bedside chair)?: A Little Help needed to walk in hospital room?: A Little Help needed climbing 3-5 steps with a railing? : A Little 6 Click Score: 18    End of Session Equipment Utilized During Treatment: Gait belt Activity Tolerance: Patient tolerated treatment well Patient left: in chair;with call bell/phone within reach Nurse Communication: Mobility status PT Visit Diagnosis: Unsteadiness on feet (R26.81);Difficulty in walking, not elsewhere classified (R26.2);Muscle weakness (generalized) (M62.81)     Time: 4098-1191 PT Time Calculation (min) (ACUTE ONLY): 28 min  Charges:  $Gait Training: 8-22 mins $Therapeutic Activity: 8-22 mins                     Felecia Shelling  PTA Acute  Rehabilitation Services Pager      [phone] Office      [phone]

## 2021-04-29 NOTE — Progress Notes (Signed)
[place] for IV heparin Indication: atrial flutter  Allergies  Allergen Reactions   Bactrim [Sulfamethoxazole-Trimethoprim]     "threw me for a loop, hives, itching, extreme fatigue"    Patient Measurements: Height: 5\' 6"  (167.6 cm) Weight: 95.5 kg (210 lb 8.6 oz) IBW/kg (Calculated) : 63.8 Heparin Dosing Weight: 84.5 kg  Vital Signs: Temp: 99.2 F (37.3 C) (09/21 2030) Temp Source: Oral (09/21 1700) BP: 110/72 (09/21 2030) Pulse Rate: 97 (09/21 2030)  Labs: Recent Labs    04/27/21 0934 04/28/21 0315 04/28/21 1223 04/29/21 0847 04/29/21 2026  HGB 7.6* 9.9* 11.0* 11.1*  --   HCT 21.7* 27.6* 31.3* 32.1*  --   PLT 130* 125*  --  159  --   HEPARINUNFRC  --   --   --   --  <0.10*  CREATININE  --   --  0.92 0.87  --      Estimated Creatinine Clearance: 84.3 mL/min (by C-G formula based on SCr of 0.87 mg/dL).  Assessment: 70 y/o M with PMH of HTN, HLD, DM2, hypothyroidism, anxiety who presented with right hip pain and osteoarthritis of the right hip, s/p R THA on 04/24/21. Patient did have acute blood loss anemia from surgery and was transfused 2 units PRBCs. Hgb has improved to 11.1 today. Pltc WNL. Pharmacy consulted today for IV heparin dosing for atrial flutter. Patient was on Aspirin 81mg  PO BID post-operatively for DVT prophylaxis, which has been discontinued by MD. Patient not on any anticoagulants PTA.  Today, 04/29/2021: First heparin level undetectable on 1250 units/hr Hgb low but stable; Plt improved to WNL No bleeding or infusion issues per RN  Goal of Therapy:  Heparin level 0.3-0.7 units/ml Monitor platelets by anticoagulation protocol: Yes   Plan:  Increase heparin to 1500 units/hr (no bolus per MD request) Recheck heparin level in 8 hr Daily CBC, heparin level Monitor closely for s/sx of bleeding   Reuel Boom, PharmD, BCPS [phone] 04/29/2021, 9:38 PM

## 2021-04-29 NOTE — Progress Notes (Signed)
OT Cancellation Note  Patient Details Name: Norman Hudson. MRN: [id number] DOB: [birth year]   Cancelled Treatment:    Reason Eval/Treat Not Completed: Other (comment) Attempted to see patient in AM and PM, eating breakfast then lunch. Reports would like to have OT stop by tomorrow when spouse is available to go over "tips and tricks" and refresh them both for self care at home. Plan to see patient 9/22 for OT.   Delbert Phenix OT OT pager: [place] 04/29/2021, 1:17 PM

## 2021-04-29 NOTE — Progress Notes (Signed)
Patient has been CPAP noncompliant since admission with both his home machine and hospital supplied equipment. He is aware that he may request assistance at any time from RT if he should decide to use nocturnal positive pressure. Order changed to prn.

## 2021-04-29 NOTE — Plan of Care (Signed)
Plan of care reviewed and discussed with the patient. 

## 2021-04-29 NOTE — Progress Notes (Signed)
Physical Therapy Treatment Patient Details Name: Norman Hudson. 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    PT Comments    POD # 5 pm session Spouse present during session and observered his session.  Assisted with amb to bathroom.  Standing void at toilet.  Pt was steady.  Assisted to sink to wash hand pt was steady with turns and proper walker use.  Assisted with amb in hallway and practiced one step he has to enter his home.  Assisted back to his room and back to bed.   Pt's mobility is such that he is safe and functioning at a level safe for D/C to home with spouse.  Pt awaiting medical clearance.   Recommendations for follow up therapy are one component of a multi-disciplinary discharge planning process, led by the attending physician.  Recommendations may be updated based on patient status, additional functional criteria and insurance authorization.  Follow Up Recommendations  Home health PT     Equipment Recommendations  None recommended by PT    Recommendations for Other Services       Precautions / Restrictions Precautions Precautions: Posterior Hip;Fall Precaution Booklet Issued: Yes (comment) Precaution Comments: Pt recalls all THP Restrictions Weight Bearing Restrictions: No RLE Weight Bearing: Weight bearing as tolerated     Mobility  Bed Mobility Overal bed mobility: Needs Assistance Bed Mobility: Sit to Supine     Supine to sit: Min assist Sit to supine: Min assist;Mod assist   General bed mobility comments: using his belt strap to self assist LE up onto bed is still a struggle for him.  Required Min/Mod Assist to support LE and to scoot to [facility] ([address]).    Transfers Overall transfer level: Needs assistance Equipment used: Rolling walker (2 wheeled) Transfers: Sit to/from [employer] Sit to Stand:  Supervision Stand pivot transfers: Supervision;Min guard       General transfer comment: <25% VC's on proper hand placement and to avoid hip flex > 90 degrees.  Also one VC's to extend LE prior to sit/stand.  Increased self ability.  Ambulation/Gait Ambulation/Gait assistance: Supervision Gait Distance (Feet): 45 Feet Assistive device: Rolling walker (2 wheeled) Gait Pattern/deviations: Step-to pattern;Decreased step length - right;Decreased step length - left;Shuffle;Trunk flexed Gait velocity: decreased   General Gait Details: decreased amb distance because also assisted to bathroom and practicing one step he has to enter his home.  Pt feeling better every day.  HR only increased to 125.   Stairs Stairs: Yes Stairs assistance: Min guard Stair Management: No rails;Step to pattern;Forwards;With walker Number of Stairs: 1 General stair comments: practiced one step he has to enter home while spouse observered.  50 % VC's on proper sequencing as well as walker placement.  Performed twice.   Wheelchair Mobility    Modified Rankin (Stroke Patients Only)       Balance                                            Cognition Arousal/Alertness: Awake/alert Behavior During Therapy: WFL for tasks assessed/performed Overall Cognitive Status: Within Functional Limits for tasks assessed  General Comments: AxO x 3 very motivated/pleasant      Exercises  Total Hip Replacement TE's following HEP Handout 10 reps ankle pumps 05 reps knee presses 05 reps heel slides 05 reps SAQ's 05 reps ABD Instructed how to use a belt loop to assist  Followed by ICE     General Comments        Pertinent Vitals/Pain Pain Assessment: 0-10 Pain Score: 5  Pain Location: R hip Pain Descriptors / Indicators: Grimacing;Operative site guarding;Tender Pain Intervention(s): Monitored during session;Premedicated before  session;Repositioned;Ice applied    Home Living                      Prior Function            PT Goals (current goals can now be found in the care plan section) Progress towards PT goals: Progressing toward goals    Frequency    7X/week      PT Plan Current plan remains appropriate    Co-evaluation              AM-PAC PT "6 Clicks" Mobility   Outcome Measure  Help needed turning from your back to your side while in a flat bed without using bedrails?: A Little Help needed moving from lying on your back to sitting on the side of a flat bed without using bedrails?: A Little Help needed moving to and from a bed to a chair (including a wheelchair)?: A Little Help needed standing up from a chair using your arms (e.g., wheelchair or bedside chair)?: A Little Help needed to walk in hospital room?: A Little Help needed climbing 3-5 steps with a railing? : A Little 6 Click Score: 18    End of Session Equipment Utilized During Treatment: Gait belt Activity Tolerance: Patient tolerated treatment well Patient left: in bed;with call bell/phone within reach;with bed alarm set Nurse Communication: Mobility status PT Visit Diagnosis: Unsteadiness on feet (R26.81);Difficulty in walking, not elsewhere classified (R26.2);Muscle weakness (generalized) (M62.81)     Time: 9562-1308 PT Time Calculation (min) (ACUTE ONLY): 25 min  Charges:  $Gait Training: 8-22 mins $Therapeutic Activity: 8-22 mins                     Felecia Shelling  PTA Acute  Rehabilitation Services Pager      [phone] Office      [phone]

## 2021-04-29 NOTE — Consult Note (Signed)
CARDIOLOGY CONSULT NOTE  Patient ID: Norman Hudson. MRN: [id number] DOB/AGE: 1951-07-14 70 y.o.  Admit date: 04/24/2021 Attending physician: Earlie Server, MD Primary Physician:  Glenis Smoker, MD Outpatient Cardiologist: Rex Kras, DO, [facility] Inpatient Cardiologist: Rex Kras, DO, [facility]  Reason of consultation: Atrial flutter with rapid ventricular rate Referring physician: Dr. Estill Cotta  Chief complaint: Elective right hip replacement  HPI:  Norman Michels. is a 70 y.o. Caucasian male who presents with a chief complaint of " elective right hip replacement." His past medical history and cardiovascular risk factors include: prediabetes, hypertension, hyperlipidemia, chronically low testosterone, hypothyroidism, former smoker with 15 pack year history, anxiety, osteoarthritis, history of TIA, advanced age.  Patient was scheduled for an elective right hip replacement which happened on 04/24/2021 and postoperatively has been experiencing lightheadedness, dizziness.  Orthostatic vital signs positive.  And noted to have acute blood loss anemia postoperatively.  His preoperative hemoglobin was 12.7 g/dL and the trough being 7.6 g/dL as of 04/27/2021.  Patient was given 2 units of packed red blood cells along with a fluid challenge which he responded to very well and his blood pressures have improved over the last 24 hours.  Patient has not been experiencing chest pain or shortness of breath postoperatively.  However EKG was performed given the postoperative course and he was noted to be in atrial flutter with variable conduction.  Cardiology is now consulted for management of atrial flutter with variable conduction.  Clinically patient is resting well without any cardiovascular symptoms.  He is currently not on telemetry and morning EKG shows controlled ventricular rate but the underlying rhythm remains atrial flutter.  ALLERGIES: Allergies  Allergen Reactions    Bactrim [Sulfamethoxazole-Trimethoprim]     "threw me for a loop, hives, itching, extreme fatigue"    PAST MEDICAL HISTORY: Past Medical History:  Diagnosis Date   Acne    Anxiety    takes Xanax daily as needed   Arthritis    History of kidney stones    Hyperlipidemia    takes Pravastatin daily   Hypertension    takes Amlodipine and Valsartan-HCTZ daily   Hypothyroidism    takes Synthroid daily   Inguinal hernia    left side   Internal hemorrhoids    Joint pain    Kidney stones    Pre-diabetes    Rosacea    TIA (transient ischemic attack) 06/2019    PAST SURGICAL HISTORY: Past Surgical History:  Procedure Laterality Date   COLONOSCOPY     EXTRACORPOREAL SHOCK WAVE LITHOTRIPSY Right 12/27/2019   Procedure: EXTRACORPOREAL SHOCK WAVE LITHOTRIPSY (ESWL);  Surgeon: Cleon Gustin, MD;  Location: [facility];  Service: Urology;  Laterality: Right;   LITHOTRIPSY  09/2015   LUMBAR LAMINECTOMY WITH COFLEX 1 LEVEL N/A 07/07/2015   Procedure: LUMBAR FOUR-FIVE LUMBAR LAMINECTOMY WITH COFLEX;  Surgeon: Eustace Moore, MD;  Location: [place] [facility];  Service: Neurosurgery;  Laterality: N/A;   TONSILLECTOMY     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   Procedure: TOTAL HIP ARTHROPLASTY;  Surgeon: Earlie Server, MD;  Location: [place];  Service: Orthopedics;  Laterality: Left;   TOTAL HIP ARTHROPLASTY Right 04/24/2021   Procedure: TOTAL HIP ARTHROPLASTY;  Surgeon: Earlie Server, MD;  Location: [facility];  Service: Orthopedics;  Laterality: Right;    FAMILY HISTORY: The patient family history includes Alcoholism in his mother; Aneurysm in his sister; Atrial fibrillation in his sister; CVA in his father; Cancer in his maternal grandfather;  auscultation bilaterally.  No stridor. No wheezes. No rales.  CARDIOVASCULAR: Irregularly irregular, variable S1/S2, no murmurs rubs or gallops appreciated.   ABDOMINAL: No apparent ascites.  EXTREMITIES: No peripheral edema  HEMATOLOGIC: No significant bruising NEUROLOGIC: Oriented to person, place, and time. Nonfocal. Normal muscle tone.  PSYCHIATRIC: Normal mood and affect. Normal behavior. Cooperative  RADIOLOGY: ECHOCARDIOGRAM COMPLETE  Result Date: 04/29/2021    ECHOCARDIOGRAM REPORT   Patient Name:   Norman Hudson. Date of Exam: 04/29/2021  Medical Rec #:  [id number]                   Height:       66.0 in Accession #:    [id number]                  Weight:       210.5 lb Date of Birth:  05-01-1951                   BSA:          2.044 m Patient Age:    70 years                    BP:           120/72 mmHg Patient Gender: M                           HR:           113 bpm. Exam Location:  Inpatient Procedure: 2D Echo, Cardiac Doppler and Color Doppler Indications:     Atrial flutter  History:         Patient has prior history of Echocardiogram examinations, most                  recent 02/27/2019. TIA, Arrythmias:Atrial Flutter,                  Signs/Symptoms:Anemia; Risk Factors:Hypertension and                  Dyslipidemia. Post op hip replacement.  Sonographer:     Dustin Flock RDCS Referring Phys:  [id number] Jonnie Finner Diagnosing Phys: Rex Kras DO IMPRESSIONS  1. Left ventricular ejection fraction, by estimation, is 60 to 65%. The left ventricle has normal function. The left ventricle has no regional wall motion abnormalities. Left ventricular diastolic function could not be evaluated due to atrial flutter.  2. Right ventricular systolic function is normal. The right ventricular size is normal. There is normal pulmonary artery systolic pressure.  3. The mitral valve is normal in structure. No evidence of mitral valve regurgitation. No evidence of mitral stenosis.  4. The aortic valve is tricuspid. Aortic valve regurgitation is not visualized. No aortic stenosis is present. Comparison(s): Compared to prior study dated 02/27/2019 (outside records) no significant change except the cardiac rhythm. FINDINGS  Left Ventricle: Left ventricular ejection fraction, by estimation, is 60 to 65%. The left ventricle has normal function. The left ventricle has no regional wall motion abnormalities. The left ventricular internal cavity size was normal in size. There is  no left ventricular hypertrophy. Left ventricular diastolic function could not be  evaluated due to atrial flutter. Normal left ventricular filling pressure. Right Ventricle: The right ventricular size is normal. No increase in right ventricular wall thickness. Right ventricular systolic function is normal. There is normal pulmonary artery systolic pressure. The tricuspid regurgitant velocity is  auscultation bilaterally.  No stridor. No wheezes. No rales.  CARDIOVASCULAR: Irregularly irregular, variable S1/S2, no murmurs rubs or gallops appreciated.   ABDOMINAL: No apparent ascites.  EXTREMITIES: No peripheral edema  HEMATOLOGIC: No significant bruising NEUROLOGIC: Oriented to person, place, and time. Nonfocal. Normal muscle tone.  PSYCHIATRIC: Normal mood and affect. Normal behavior. Cooperative  RADIOLOGY: ECHOCARDIOGRAM COMPLETE  Result Date: 04/29/2021    ECHOCARDIOGRAM REPORT   Patient Name:   Norman Hudson. Date of Exam: 04/29/2021  Medical Rec #:  [id number]                   Height:       66.0 in Accession #:    [id number]                  Weight:       210.5 lb Date of Birth:  05-01-1951                   BSA:          2.044 m Patient Age:    70 years                    BP:           120/72 mmHg Patient Gender: M                           HR:           113 bpm. Exam Location:  Inpatient Procedure: 2D Echo, Cardiac Doppler and Color Doppler Indications:     Atrial flutter  History:         Patient has prior history of Echocardiogram examinations, most                  recent 02/27/2019. TIA, Arrythmias:Atrial Flutter,                  Signs/Symptoms:Anemia; Risk Factors:Hypertension and                  Dyslipidemia. Post op hip replacement.  Sonographer:     Dustin Flock RDCS Referring Phys:  [id number] Jonnie Finner Diagnosing Phys: Rex Kras DO IMPRESSIONS  1. Left ventricular ejection fraction, by estimation, is 60 to 65%. The left ventricle has normal function. The left ventricle has no regional wall motion abnormalities. Left ventricular diastolic function could not be evaluated due to atrial flutter.  2. Right ventricular systolic function is normal. The right ventricular size is normal. There is normal pulmonary artery systolic pressure.  3. The mitral valve is normal in structure. No evidence of mitral valve regurgitation. No evidence of mitral stenosis.  4. The aortic valve is tricuspid. Aortic valve regurgitation is not visualized. No aortic stenosis is present. Comparison(s): Compared to prior study dated 02/27/2019 (outside records) no significant change except the cardiac rhythm. FINDINGS  Left Ventricle: Left ventricular ejection fraction, by estimation, is 60 to 65%. The left ventricle has normal function. The left ventricle has no regional wall motion abnormalities. The left ventricular internal cavity size was normal in size. There is  no left ventricular hypertrophy. Left ventricular diastolic function could not be  evaluated due to atrial flutter. Normal left ventricular filling pressure. Right Ventricle: The right ventricular size is normal. No increase in right ventricular wall thickness. Right ventricular systolic function is normal. There is normal pulmonary artery systolic pressure. The tricuspid regurgitant velocity is  auscultation bilaterally.  No stridor. No wheezes. No rales.  CARDIOVASCULAR: Irregularly irregular, variable S1/S2, no murmurs rubs or gallops appreciated.   ABDOMINAL: No apparent ascites.  EXTREMITIES: No peripheral edema  HEMATOLOGIC: No significant bruising NEUROLOGIC: Oriented to person, place, and time. Nonfocal. Normal muscle tone.  PSYCHIATRIC: Normal mood and affect. Normal behavior. Cooperative  RADIOLOGY: ECHOCARDIOGRAM COMPLETE  Result Date: 04/29/2021    ECHOCARDIOGRAM REPORT   Patient Name:   Norman Hudson. Date of Exam: 04/29/2021  Medical Rec #:  [id number]                   Height:       66.0 in Accession #:    [id number]                  Weight:       210.5 lb Date of Birth:  05-01-1951                   BSA:          2.044 m Patient Age:    70 years                    BP:           120/72 mmHg Patient Gender: M                           HR:           113 bpm. Exam Location:  Inpatient Procedure: 2D Echo, Cardiac Doppler and Color Doppler Indications:     Atrial flutter  History:         Patient has prior history of Echocardiogram examinations, most                  recent 02/27/2019. TIA, Arrythmias:Atrial Flutter,                  Signs/Symptoms:Anemia; Risk Factors:Hypertension and                  Dyslipidemia. Post op hip replacement.  Sonographer:     Dustin Flock RDCS Referring Phys:  [id number] Jonnie Finner Diagnosing Phys: Rex Kras DO IMPRESSIONS  1. Left ventricular ejection fraction, by estimation, is 60 to 65%. The left ventricle has normal function. The left ventricle has no regional wall motion abnormalities. Left ventricular diastolic function could not be evaluated due to atrial flutter.  2. Right ventricular systolic function is normal. The right ventricular size is normal. There is normal pulmonary artery systolic pressure.  3. The mitral valve is normal in structure. No evidence of mitral valve regurgitation. No evidence of mitral stenosis.  4. The aortic valve is tricuspid. Aortic valve regurgitation is not visualized. No aortic stenosis is present. Comparison(s): Compared to prior study dated 02/27/2019 (outside records) no significant change except the cardiac rhythm. FINDINGS  Left Ventricle: Left ventricular ejection fraction, by estimation, is 60 to 65%. The left ventricle has normal function. The left ventricle has no regional wall motion abnormalities. The left ventricular internal cavity size was normal in size. There is  no left ventricular hypertrophy. Left ventricular diastolic function could not be  evaluated due to atrial flutter. Normal left ventricular filling pressure. Right Ventricle: The right ventricular size is normal. No increase in right ventricular wall thickness. Right ventricular systolic function is normal. There is normal pulmonary artery systolic pressure. The tricuspid regurgitant velocity is  auscultation bilaterally.  No stridor. No wheezes. No rales.  CARDIOVASCULAR: Irregularly irregular, variable S1/S2, no murmurs rubs or gallops appreciated.   ABDOMINAL: No apparent ascites.  EXTREMITIES: No peripheral edema  HEMATOLOGIC: No significant bruising NEUROLOGIC: Oriented to person, place, and time. Nonfocal. Normal muscle tone.  PSYCHIATRIC: Normal mood and affect. Normal behavior. Cooperative  RADIOLOGY: ECHOCARDIOGRAM COMPLETE  Result Date: 04/29/2021    ECHOCARDIOGRAM REPORT   Patient Name:   Norman Hudson. Date of Exam: 04/29/2021  Medical Rec #:  [id number]                   Height:       66.0 in Accession #:    [id number]                  Weight:       210.5 lb Date of Birth:  05-01-1951                   BSA:          2.044 m Patient Age:    70 years                    BP:           120/72 mmHg Patient Gender: M                           HR:           113 bpm. Exam Location:  Inpatient Procedure: 2D Echo, Cardiac Doppler and Color Doppler Indications:     Atrial flutter  History:         Patient has prior history of Echocardiogram examinations, most                  recent 02/27/2019. TIA, Arrythmias:Atrial Flutter,                  Signs/Symptoms:Anemia; Risk Factors:Hypertension and                  Dyslipidemia. Post op hip replacement.  Sonographer:     Dustin Flock RDCS Referring Phys:  [id number] Jonnie Finner Diagnosing Phys: Rex Kras DO IMPRESSIONS  1. Left ventricular ejection fraction, by estimation, is 60 to 65%. The left ventricle has normal function. The left ventricle has no regional wall motion abnormalities. Left ventricular diastolic function could not be evaluated due to atrial flutter.  2. Right ventricular systolic function is normal. The right ventricular size is normal. There is normal pulmonary artery systolic pressure.  3. The mitral valve is normal in structure. No evidence of mitral valve regurgitation. No evidence of mitral stenosis.  4. The aortic valve is tricuspid. Aortic valve regurgitation is not visualized. No aortic stenosis is present. Comparison(s): Compared to prior study dated 02/27/2019 (outside records) no significant change except the cardiac rhythm. FINDINGS  Left Ventricle: Left ventricular ejection fraction, by estimation, is 60 to 65%. The left ventricle has normal function. The left ventricle has no regional wall motion abnormalities. The left ventricular internal cavity size was normal in size. There is  no left ventricular hypertrophy. Left ventricular diastolic function could not be  evaluated due to atrial flutter. Normal left ventricular filling pressure. Right Ventricle: The right ventricular size is normal. No increase in right ventricular wall thickness. Right ventricular systolic function is normal. There is normal pulmonary artery systolic pressure. The tricuspid regurgitant velocity is  CARDIOLOGY CONSULT NOTE  Patient ID: Norman Hudson. MRN: [id number] DOB/AGE: 1951-07-14 70 y.o.  Admit date: 04/24/2021 Attending physician: Earlie Server, MD Primary Physician:  Glenis Smoker, MD Outpatient Cardiologist: Rex Kras, DO, [facility] Inpatient Cardiologist: Rex Kras, DO, [facility]  Reason of consultation: Atrial flutter with rapid ventricular rate Referring physician: Dr. Estill Cotta  Chief complaint: Elective right hip replacement  HPI:  Norman Michels. is a 70 y.o. Caucasian male who presents with a chief complaint of " elective right hip replacement." His past medical history and cardiovascular risk factors include: prediabetes, hypertension, hyperlipidemia, chronically low testosterone, hypothyroidism, former smoker with 15 pack year history, anxiety, osteoarthritis, history of TIA, advanced age.  Patient was scheduled for an elective right hip replacement which happened on 04/24/2021 and postoperatively has been experiencing lightheadedness, dizziness.  Orthostatic vital signs positive.  And noted to have acute blood loss anemia postoperatively.  His preoperative hemoglobin was 12.7 g/dL and the trough being 7.6 g/dL as of 04/27/2021.  Patient was given 2 units of packed red blood cells along with a fluid challenge which he responded to very well and his blood pressures have improved over the last 24 hours.  Patient has not been experiencing chest pain or shortness of breath postoperatively.  However EKG was performed given the postoperative course and he was noted to be in atrial flutter with variable conduction.  Cardiology is now consulted for management of atrial flutter with variable conduction.  Clinically patient is resting well without any cardiovascular symptoms.  He is currently not on telemetry and morning EKG shows controlled ventricular rate but the underlying rhythm remains atrial flutter.  ALLERGIES: Allergies  Allergen Reactions    Bactrim [Sulfamethoxazole-Trimethoprim]     "threw me for a loop, hives, itching, extreme fatigue"    PAST MEDICAL HISTORY: Past Medical History:  Diagnosis Date   Acne    Anxiety    takes Xanax daily as needed   Arthritis    History of kidney stones    Hyperlipidemia    takes Pravastatin daily   Hypertension    takes Amlodipine and Valsartan-HCTZ daily   Hypothyroidism    takes Synthroid daily   Inguinal hernia    left side   Internal hemorrhoids    Joint pain    Kidney stones    Pre-diabetes    Rosacea    TIA (transient ischemic attack) 06/2019    PAST SURGICAL HISTORY: Past Surgical History:  Procedure Laterality Date   COLONOSCOPY     EXTRACORPOREAL SHOCK WAVE LITHOTRIPSY Right 12/27/2019   Procedure: EXTRACORPOREAL SHOCK WAVE LITHOTRIPSY (ESWL);  Surgeon: Cleon Gustin, MD;  Location: [facility];  Service: Urology;  Laterality: Right;   LITHOTRIPSY  09/2015   LUMBAR LAMINECTOMY WITH COFLEX 1 LEVEL N/A 07/07/2015   Procedure: LUMBAR FOUR-FIVE LUMBAR LAMINECTOMY WITH COFLEX;  Surgeon: Eustace Moore, MD;  Location: [place] [facility];  Service: Neurosurgery;  Laterality: N/A;   TONSILLECTOMY     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   Procedure: TOTAL HIP ARTHROPLASTY;  Surgeon: Earlie Server, MD;  Location: [place];  Service: Orthopedics;  Laterality: Left;   TOTAL HIP ARTHROPLASTY Right 04/24/2021   Procedure: TOTAL HIP ARTHROPLASTY;  Surgeon: Earlie Server, MD;  Location: [facility];  Service: Orthopedics;  Laterality: Right;    FAMILY HISTORY: The patient family history includes Alcoholism in his mother; Aneurysm in his sister; Atrial fibrillation in his sister; CVA in his father; Cancer in his maternal grandfather;

## 2021-04-29 NOTE — Progress Notes (Signed)
Pt declined nocturnal cpap.  Pt encouraged to call should he change his mind.  RT will assist as needed.

## 2021-04-29 NOTE — Progress Notes (Signed)
[facility] Consult Progress note                                                                               Patient Demographics  Norman Hudson, is a 70 y.o. male, DOB - 11/10/1950, MWN:[id number]  Admit date - 04/24/2021   Admitting Physician Norman Hamman, MD  Outpatient Primary MD for the patient is Norman Hale, MD  Outpatient specialists:   LOS - 3  days   Medical records reviewed and are as summarized below:    No chief complaint on file.      Brief summary   Patient is a 70 year old male with hypertension, hyperlipidemia, diabetes mellitus type 2, hypothyroidism, anxiety had presented with right hip pain and osteoarthritis, underwent total hip replacement on 9/16.  [facility] was consulted on 9/20 due to dizziness, orthostasis, tachycardia. Hemoglobin down to 7.6 on 9/19, received 2 units packed RBCs. Received IV fluid hydration, orthostasis improved however still tachycardiac   Assessment & Plan       Degenerative joint disease of right hip -Status post total hip replacement on 9/16 -Management per orthopedics     Postoperative anemia due to acute blood loss -Post surgery, hemoglobin down to 7.6 on 9/19, received 2 units packed RBCs and responded well -Hemoglobin 11.1  New onset atrial flutter -Noted after patient had persistent tachycardia despite IV fluids and packed RBC transfusion -EKG shows persistent atrial flutter, requested cardiology consult as patient follows outpatient with Norman Hudson. -Appreciate recommendations, discontinued Norvasc, reduced Aldactone, HCTZ, resumed beta-blocker -Needs to be on DOAC, recommended IV heparin drip today, if hemoglobin remained stable and no other issues, may placed on Eliquis at the time of discharge.  Will also obtain orthopedics recommendations if cleared for DOAC, paged Norman Hudson, awaiting response. -Discontinued aspirin, Plavix -Follow 2D echo  Essential hypertension -Appreciate  cardiology adjustment of the meds, BP has been somewhat borderline  Diabetes mellitus type 2 -CBGs currently stable, hemoglobin A1c 6.0 on 04/16/2021 -Continue SSI, hold metformin  Hypothyroidism -Continue Synthroid, follow TSH in a.m.  Anxiety  -Continue Xanax, prn  Estimated body mass index is 33.98 kg/m as calculated from the following:   Height as of this encounter: 5\' 6"  (1.676 m).   Weight as of this encounter: 95.5 kg.  Code Status: Full CODE STATUS DVT Prophylaxis:  SCDs Start: 04/24/21 1207 Place TED hose Start: 04/24/21 1207   IV heparin drip  Level of Care: Level of care: Med-Surg Family Communication: Discussed all imaging results, lab results, explained to the patient or *   Disposition Plan:     Status is: Inpatient  Remains inpatient appropriate because:Inpatient level of care appropriate due to severity of illness  Dispo: The patient is from: Home              Anticipated d/c is to: Home              Patient currently is not medically stable to d/c.  Likely DC home in a.m. if H&H remained stable, started on IV heparin drip today   Difficult to place patient No      Time Spent in minutes      Antimicrobials:   Anti-infectives (From admission, onward)    Start     Dose/Rate Route Frequency Ordered Stop   04/24/21 0600  ceFAZolin (ANCEF) IVPB 2g/100 mL premix        2 g 200 mL/hr over 30 Minutes Intravenous On call to O.R. 04/24/21 0512 04/24/21 0816          Medications  Scheduled Meds:  docusate sodium  100 mg Oral BID   [START ON 04/30/2021] hydrochlorothiazide  12.5 mg Oral Daily   insulin aspart  0-15 Units Subcutaneous TID WC   insulin aspart  0-5 Units Subcutaneous QHS   insulin aspart  4 Units Subcutaneous TID WC   levothyroxine  100 mcg Oral Q0600   metoprolol tartrate  12.5 mg Oral BID   rosuvastatin  20 mg Oral QHS   [START ON 04/30/2021] spironolactone  12.5 mg Oral Daily   Continuous Infusions:  heparin 1,250  Units/hr (04/29/21 1235)   lactated ringers with kcl 50 mL/hr at 04/28/21 1708   lactated ringers Stopped (04/24/21 1218)   PRN Meds:.acetaminophen, ALPRAZolam, diphenhydrAMINE, HYDROmorphone (DILAUDID) injection, menthol-cetylpyridinium **OR** phenol, metoCLOPramide **OR** metoCLOPramide (REGLAN) injection, ondansetron **OR** ondansetron (ZOFRAN) IV, oxyCODONE, polyethylene glycol, sodium phosphate, sorbitol      Subjective:   Norman Hudson was seen and examined today.  Still in atrial flutter, asymptomatic, somewhat anxious.  Patient denies dizziness, chest pain, shortness of breath, abdominal pain, N/V/D/C. No acute events overnight.    Objective:   Vitals:   04/28/21 1431 04/28/21 2016 04/29/21 0525 04/29/21 0900  BP: 104/69 120/67 119/75   Pulse: (!) 105 91 96   Resp: 18 17 16 16   Temp: 98.4 F (36.9 C) 99.5 F (37.5 C) 99.4 F (37.4 C) 99.2 F (37.3 C)  TempSrc: Oral  Oral Oral  SpO2: 99% 98% 98% 100%  Weight:      Height:        Intake/Output Summary (Last 24 hours) at 04/29/2021 1246 Last data filed at 04/29/2021 1158 Gross per 24 hour  Intake 2317.45 ml  Output 1975 ml  Net 342.45 ml     Wt Readings from Last 3 Encounters:  04/24/21 95.5 kg  04/16/21 92.1 kg  03/18/21 95.3 kg     Exam General: Alert and oriented x 3, NAD Cardiovascular: S1 S2 clear, no murmurs Respiratory: Diminished breath sound at the bases Gastrointestinal: Soft, nontender, nondistended, + bowel sounds Ext: no pedal edema bilaterally Neuro: no new neurological deficits Psych: Normal affect and demeanor, alert and oriented x3    Data Reviewed:  I have personally reviewed following labs and imaging studies  Micro Results Recent Results (from the past 240 hour(s))  SARS Coronavirus 2 by RT PCR (hospital order, performed in [facility] hospital lab) Nasopharyngeal Nasopharyngeal Swab     Status: None   Collection Time: 04/24/21  5:26 AM   Specimen: Nasopharyngeal Swab  Result  Value Ref Range Status   SARS Coronavirus 2 NEGATIVE NEGATIVE Final    Comment: (NOTE) SARS-CoV-2 target nucleic acids are NOT DETECTED.  The SARS-CoV-2 RNA is generally detectable in upper and lower respiratory specimens during the acute phase of infection. The lowest concentration of SARS-CoV-2 viral copies this assay can detect is 250 copies / mL. A negative result does not preclude SARS-CoV-2 infection and should not be used as the sole basis for treatment or other patient management decisions.  A negative result may occur with improper specimen collection / handling, submission of specimen other  than nasopharyngeal swab, presence of viral mutation(s) within the areas targeted by this assay, and inadequate number of viral copies (<250 copies / mL). A negative result must be combined with clinical observations, patient history, and epidemiological information.  Fact Sheet for Patients:   BoilerBrush.com.cy  Fact Sheet for Healthcare Providers: https://pope.com/  This test is not yet approved or  cleared by the [place] FDA and has been authorized for detection and/or diagnosis of SARS-CoV-2 by FDA under an Emergency Use Authorization (EUA).  This EUA will remain in effect (meaning this test can be used) for the duration of the COVID-19 declaration under Section 564(b)(1) of the Act, 21 U.S.C. section 360bbb-3(b)(1), unless the authorization is terminated or revoked sooner.  Performed at [facility], [address], [place], [place] [address]   Culture, blood (routine x 2)     Status: None (Preliminary result)   Collection Time: 04/28/21 12:25 PM   Specimen: BLOOD  Result Value Ref Range Status   Specimen Description   Final    BLOOD RIGHT ANTECUBITAL Performed at [facility], [address], [place], [place] [address]    Special Requests   Final    BOTTLES DRAWN AEROBIC ONLY Blood  Culture adequate volume Performed at [facility], [address], [place], [place] [address]    Culture   Final    NO GROWTH < 24 HOURS Performed at [facility], [address], [place], [place] [address]    Report Status PENDING  Incomplete  Culture, blood (routine x 2)     Status: None (Preliminary result)   Collection Time: 04/28/21 12:25 PM   Specimen: BLOOD  Result Value Ref Range Status   Specimen Description   Final    BLOOD BLOOD RIGHT HAND Performed at [facility], [address] [place], [place] [address]    Special Requests   Final    BOTTLES DRAWN AEROBIC ONLY Blood Culture adequate volume Performed at [facility], [address], [place], [place] [address]    Culture   Final    NO GROWTH < 24 HOURS Performed at [facility], [address] [place], [place] [address]    Report Status PENDING  Incomplete    Radiology Reports DG Pelvis Portable  Result Date: 04/24/2021 CLINICAL DATA:  Status post right hip total arthroplasty EXAM: PORTABLE PELVIS 1-2 VIEWS; DG HIP (WITH OR WITHOUT PELVIS) 1V PORT RIGHT COMPARISON:  None. FINDINGS: Status post right hip total arthroplasty with expected overlying postoperative change. No evidence of perihardware fracture or component loosening. Additionally status post remote left hip total arthroplasty. The bony pelvis is unremarkable in single frontal view. Nonobstructive pattern of overlying bowel gas. IMPRESSION: Status post right hip total arthroplasty with expected overlying postoperative change. No evidence of perihardware fracture or component loosening. Electronically Signed   By: Lauralyn Primes M.D.   On: 04/24/2021 13:35   DG Hip Port Unilat With Pelvis 1V Right  Result Date: 04/24/2021 CLINICAL DATA:  Status post right hip total arthroplasty EXAM: PORTABLE PELVIS 1-2 VIEWS; DG HIP (WITH OR WITHOUT PELVIS) 1V PORT RIGHT COMPARISON:  None. FINDINGS: Status  post right hip total arthroplasty with expected overlying postoperative change. No evidence of perihardware fracture or component loosening. Additionally status post remote left hip total arthroplasty. The bony pelvis is unremarkable in single frontal view. Nonobstructive pattern of overlying bowel gas. IMPRESSION: Status post right hip total arthroplasty with expected overlying  postoperative change. No evidence of perihardware fracture or component loosening. Electronically Signed   By: Lauralyn Primes M.D.   On: 04/24/2021 13:35   US Abdomen Limited RUQ (LIVER/GB)  Result Date: 04/28/2021 CLINICAL DATA:  Elevated LFTs. EXAM: ULTRASOUND ABDOMEN LIMITED RIGHT UPPER QUADRANT COMPARISON:  None. FINDINGS: Gallbladder: No gallstones or wall thickening visualized. No sonographic Murphy sign noted by sonographer. Common bile duct: Diameter: 7 mm Liver: There is diffuse increased liver echogenicity most commonly seen in the setting of fatty infiltration. Superimposed inflammation or fibrosis is not excluded. Clinical correlation is recommended. Portal vein is patent on color Doppler imaging with normal direction of blood flow towards the liver. Other: None. IMPRESSION: Fatty liver, otherwise unremarkable right upper quadrant ultrasound. Electronically Signed   By: Elgie Collard M.D.   On: 04/28/2021 20:29    Lab Data:  CBC: Recent Labs  Lab 04/25/21 0307 04/27/21 0934 04/28/21 0315 04/28/21 1223 04/29/21 0847  WBC 11.1* 8.7 7.0  --  6.3  NEUTROABS 7.9* 6.6  --   --   --   HGB 8.6* 7.6* 9.9* 11.0* 11.1*  HCT 25.1* 21.7* 27.6* 31.3* 32.1*  MCV 98.0 97.7 92.9  --  95.3  PLT 134* 130* 125*  --  159   Basic Metabolic Panel: Recent Labs  Lab 04/25/21 0307 04/28/21 1223 04/29/21 0847  NA 134* 137 139  K 3.8 4.1 4.2  CL 101 106 109  CO2 24 24 24   GLUCOSE 140* 160* 180*  BUN 18 18 15   CREATININE 1.02 0.92 0.87  CALCIUM 8.6* 8.8* 8.6*   GFR: Estimated Creatinine Clearance: 84.3 mL/min (by C-G  formula based on SCr of 0.87 mg/dL). Liver Function Tests: Recent Labs  Lab 04/28/21 1223 04/29/21 0847  AST 188* 150*  ALT 131* 174*  ALKPHOS 47 47  BILITOT 0.8 1.0  PROT 6.5 6.5  ALBUMIN 3.0* 2.9*   No results for input(s): LIPASE, AMYLASE in the last 168 hours. No results for input(s): AMMONIA in the last 168 hours. Coagulation Profile: No results for input(s): INR, PROTIME in the last 168 hours. Cardiac Enzymes: No results for input(s): CKTOTAL, CKMB, CKMBINDEX, TROPONINI in the last 168 hours. BNP (last 3 results) No results for input(s): PROBNP in the last 8760 hours. HbA1C: No results for input(s): HGBA1C in the last 72 hours. CBG: Recent Labs  Lab 04/28/21 1221 04/28/21 1705 04/28/21 2031 04/29/21 0740 04/29/21 1215  GLUCAP 156* 98 178* 133* 154*   Lipid Profile: No results for input(s): CHOL, HDL, LDLCALC, TRIG, CHOLHDL, LDLDIRECT in the last 72 hours. Thyroid Function Tests: No results for input(s): TSH, T4TOTAL, FREET4, T3FREE, THYROIDAB in the last 72 hours. Anemia Panel: No results for input(s): VITAMINB12, FOLATE, FERRITIN, TIBC, IRON, RETICCTPCT in the last 72 hours. Urine analysis:    Component Value Date/Time   COLORURINE YELLOW 04/27/2021 1809   APPEARANCEUR CLEAR 04/27/2021 1809   LABSPEC 1.010 04/27/2021 1809   PHURINE 7.0 04/27/2021 1809   GLUCOSEU NEGATIVE 04/27/2021 1809   HGBUR MODERATE (A) 04/27/2021 1809   BILIRUBINUR NEGATIVE 04/27/2021 1809   KETONESUR NEGATIVE 04/27/2021 1809   PROTEINUR 30 (A) 04/27/2021 1809   NITRITE NEGATIVE 04/27/2021 1809   LEUKOCYTESUR TRACE (A) 04/27/2021 1809     Deane Wattenbarger M.D. [facility] 04/29/2021, 12:46 PM  Available via Epic secure chat 7am-7pm After 7 pm, please refer to night coverage provider listed on amion.

## 2021-04-30 DIAGNOSIS — Z7901 Long term (current) use of anticoagulants: Secondary | ICD-10-CM

## 2021-04-30 DIAGNOSIS — M1611 Unilateral primary osteoarthritis, right hip: Secondary | ICD-10-CM | POA: Diagnosis not present

## 2021-04-30 DIAGNOSIS — Z8673 Personal history of transient ischemic attack (TIA), and cerebral infarction without residual deficits: Secondary | ICD-10-CM

## 2021-04-30 DIAGNOSIS — I951 Orthostatic hypotension: Secondary | ICD-10-CM

## 2021-04-30 DIAGNOSIS — I10 Essential (primary) hypertension: Secondary | ICD-10-CM

## 2021-04-30 DIAGNOSIS — Z96641 Presence of right artificial hip joint: Secondary | ICD-10-CM

## 2021-04-30 DIAGNOSIS — I4892 Unspecified atrial flutter: Secondary | ICD-10-CM

## 2021-04-30 LAB — CBC
HCT: 31.8 % — ABNORMAL LOW (ref 39.0–52.0)
Hemoglobin: 11 g/dL — ABNORMAL LOW (ref 13.0–17.0)
MCH: 32.8 pg (ref 26.0–34.0)
MCHC: 34.6 g/dL (ref 30.0–36.0)
MCV: 94.9 fL (ref 80.0–100.0)
Platelets: 170 K/uL (ref 150–400)
RBC: 3.35 MIL/uL — ABNORMAL LOW (ref 4.22–5.81)
RDW: 13.6 % (ref 11.5–15.5)
WBC: 7.8 K/uL (ref 4.0–10.5)
nRBC: 0 % (ref 0.0–0.2)

## 2021-04-30 LAB — BASIC METABOLIC PANEL
Anion gap: 5 (ref 5–15)
BUN: 16 mg/dL (ref 8–23)
CO2: 24 mmol/L (ref 22–32)
Calcium: 8.5 mg/dL — ABNORMAL LOW (ref 8.9–10.3)
Chloride: 108 mmol/L (ref 98–111)
Creatinine, Ser: 0.91 mg/dL (ref 0.61–1.24)
GFR, Estimated: >60 mL/min (ref >60)
Glucose, Bld: 138 mg/dL — ABNORMAL HIGH (ref 70–99)
Potassium: 4 mmol/L (ref 3.5–5.1)
Sodium: 137 mmol/L (ref 135–145)

## 2021-04-30 LAB — HEPARIN LEVEL (UNFRACTIONATED): Heparin Unfractionated: <0.1 IU/mL — ABNORMAL LOW (ref 0.30–0.70)

## 2021-04-30 LAB — GLUCOSE, CAPILLARY
Glucose-Capillary: 127 mg/dL — ABNORMAL HIGH (ref 70–99)
Glucose-Capillary: 122 mg/dL — ABNORMAL HIGH (ref 70–99)

## 2021-04-30 LAB — TSH: TSH: 3.918 u[IU]/mL (ref 0.350–4.500)

## 2021-04-30 MED ORDER — APIXABAN 5 MG PO TABS: 5.0000 mg | ORAL_TABLET | Freq: Two times a day (BID) | ORAL | 3 refills | Status: DC

## 2021-04-30 MED ORDER — HYDROCHLOROTHIAZIDE 12.5 MG PO TABS: 12.5000 mg | ORAL_TABLET | Freq: Every day | ORAL | 3 refills | Status: DC

## 2021-04-30 MED ORDER — HEPARIN (PORCINE) 25000 UT/250ML-% IV SOLN
1800.0000 [IU]/h | INTRAVENOUS | Status: AC
  Administered 2021-04-30: 1800 [IU]/h via INTRAVENOUS
  Filled 2021-04-30: qty 250

## 2021-04-30 MED ORDER — METOPROLOL TARTRATE 25 MG PO TABS: 12.5000 mg | ORAL_TABLET | Freq: Two times a day (BID) | ORAL | 3 refills | Status: DC

## 2021-04-30 MED ORDER — APIXABAN 5 MG PO TABS
5.0000 mg | ORAL_TABLET | Freq: Two times a day (BID) | ORAL | Status: DC
  Administered 2021-04-30: 5 mg via ORAL
  Filled 2021-04-30: qty 1

## 2021-04-30 MED ORDER — APIXABAN 5 MG PO TABS: 5.0000 mg | ORAL_TABLET | Freq: Two times a day (BID) | ORAL | 0 refills | Status: DC

## 2021-04-30 MED ORDER — SPIRONOLACTONE 25 MG PO TABS: 12.5000 mg | ORAL_TABLET | Freq: Every day | ORAL | 2 refills | Status: DC

## 2021-04-30 NOTE — Progress Notes (Signed)
Progress Note  Patient Name: Norman Hudson. Date of Encounter: 04/30/2021  Attending physician: Frederico Hamman, MD Primary care provider: Shon Hale, MD Primary Cardiologist: Tessa Lerner, DO, [facility] Consultant:Mackenzy Eisenberg [place], DO  Subjective: Norman Hudson. is a 70 y.o. male who was seen and examined  Accompanied by his wife at bedside. Denies any chest pain, shortness of breath, orthopnea, paroxysmal nocturnal dyspnea or lower extremity swelling.   Denies lightheaded and dizziness.   Blood pressure remained stable  Hemoglobin remained stable.   Case discussed and reviewed with his nurse.  Objective: Vital Signs in the last 24 hours: Temp:  [98.1 F (36.7 C)-99.4 F (37.4 C)] 99.4 F (37.4 C) (09/22 0458) Pulse Rate:  [95-97] 95 (09/22 0458) Resp:  [16-18] 17 (09/22 0458) BP: (110-124)/(72-79) 124/79 (09/22 0458) SpO2:  [98 %-100 %] 98 % (09/22 0458)  Intake/Output:  Intake/Output Summary (Last 24 hours) at 04/30/2021 0931 Last data filed at 04/30/2021 0748 Gross per 24 hour  Intake 1200.39 ml  Output 2275 ml  Net -1074.61 ml    Net IO Since Admission: 406.27 mL [04/30/21 0931]  Weights:  Filed Weights   04/24/21 0548 04/24/21 2205  Weight: 92.1 kg 95.5 kg    Telemetry: Not on telemetry  Physical examination: PHYSICAL EXAM: Vitals with BMI 04/30/2021 04/29/2021 04/29/2021  Height - - -  Weight - - -  BMI - - -  Systolic 124 110 638  Diastolic 79 72 72  Pulse 95 97 96    CONSTITUTIONAL: Well-developed and well-nourished. No acute distress.  SKIN: Skin is warm and dry. No rash noted. No cyanosis. No pallor. No jaundice HEAD: Normocephalic and atraumatic.  EYES: No scleral icterus MOUTH/THROAT: Moist oral membranes.  NECK: No JVD present. No thyromegaly noted. No carotid bruits  LYMPHATIC: No visible cervical adenopathy.  CHEST Normal respiratory effort. No intercostal retractions  LUNGS: Clear to auscultation bilaterally.  No  stridor. No wheezes. No rales.  CARDIOVASCULAR: Irregularly irregular, variable S1-S2, no murmurs rubs or gallops appreciated ABDOMINAL: No apparent ascites.  EXTREMITIES: No peripheral edema, compression stockings present. HEMATOLOGIC: No significant bruising NEUROLOGIC: Oriented to person, place, and time. Nonfocal. Normal muscle tone.  PSYCHIATRIC: Normal mood and affect. Normal behavior. Cooperative  Lab Results: Hematology Recent Labs  Lab 04/28/21 0315 04/28/21 1223 04/29/21 0847 04/30/21 0605  WBC 7.0  --  6.3 7.8  RBC 2.97*  --  3.37* 3.35*  HGB 9.9* 11.0* 11.1* 11.0*  HCT 27.6* 31.3* 32.1* 31.8*  MCV 92.9  --  95.3 94.9  MCH 33.3  --  32.9 32.8  MCHC 35.9  --  34.6 34.6  RDW 13.8  --  13.8 13.6  PLT 125*  --  159 170    Chemistry Recent Labs  Lab 04/28/21 1223 04/29/21 0847 04/30/21 0605  NA 137 139 137  K 4.1 4.2 4.0  CL 106 109 108  CO2 24 24 24   GLUCOSE 160* 180* 138*  BUN 18 15 16   CREATININE 0.92 0.87 0.91  CALCIUM 8.8* 8.6* 8.5*  PROT 6.5 6.5  --   ALBUMIN 3.0* 2.9*  --   AST 188* 150*  --   ALT 131* 174*  --   ALKPHOS 47 47  --   BILITOT 0.8 1.0  --   GFRNONAA >60 >60 >60  ANIONGAP 7 6 5      Cardiac Enzymes: Cardiac Panel (last 3 results) No results for input(s): CKTOTAL, CKMB, TROPONINIHS, RELINDX in the last 72 hours.  BNP (  last 3 results) No results for input(s): BNP in the last 8760 hours.  ProBNP (last 3 results) No results for input(s): PROBNP in the last 8760 hours.   DDimer No results for input(s): DDIMER in the last 168 hours.   Hemoglobin A1c:  Lab Results  Component Value Date   HGBA1C 6.0 (H) 04/16/2021   MPG 125.5 04/16/2021    TSH  Recent Labs    04/30/21 0605  TSH 3.918    Lipid Panel     Component Value Date/Time   CHOL 139 07/12/2019 1026   TRIG 115 07/12/2019 1026   HDL 46 07/12/2019 1026   CHOLHDL 3.0 07/12/2019 1026   LDLCALC 72 07/12/2019 1026    Imaging: ECHOCARDIOGRAM COMPLETE  Result  Date: 04/29/2021    ECHOCARDIOGRAM REPORT   Patient Name:   Norman Hudson. Date of Exam: 04/29/2021 Medical Rec #:  [id number]                   Height:       66.0 in Accession #:    [id number]                  Weight:       210.5 lb Date of Birth:  1951/05/01                   BSA:          2.044 m Patient Age:    70 years                    BP:           120/72 mmHg Patient Gender: M                           HR:           113 bpm. Exam Location:  Inpatient Procedure: 2D Echo, Cardiac Doppler and Color Doppler Indications:     Atrial flutter  History:         Patient has prior history of Echocardiogram examinations, most                  recent 02/27/2019. TIA, Arrythmias:Atrial Flutter,                  Signs/Symptoms:Anemia; Risk Factors:Hypertension and                  Dyslipidemia. Post op hip replacement.  Sonographer:     Lavenia Atlas RDCS Referring Phys:  [id number] Teddy Spike Diagnosing Phys: Tessa Lerner DO IMPRESSIONS  1. Left ventricular ejection fraction, by estimation, is 60 to 65%. The left ventricle has normal function. The left ventricle has no regional wall motion abnormalities. Left ventricular diastolic function could not be evaluated due to atrial flutter.  2. Right ventricular systolic function is normal. The right ventricular size is normal. There is normal pulmonary artery systolic pressure.  3. The mitral valve is normal in structure. No evidence of mitral valve regurgitation. No evidence of mitral stenosis.  4. The aortic valve is tricuspid. Aortic valve regurgitation is not visualized. No aortic stenosis is present. Comparison(s): Compared to prior study dated 02/27/2019 (outside records) no significant change except the cardiac rhythm. FINDINGS  Left Ventricle: Left ventricular ejection fraction, by estimation, is 60 to 65%. The left ventricle has normal function. The left ventricle has no regional wall motion abnormalities. The left ventricular  internal cavity size was  normal in size. There is  no left ventricular hypertrophy. Left ventricular diastolic function could not be evaluated due to atrial flutter. Normal left ventricular filling pressure. Right Ventricle: The right ventricular size is normal. No increase in right ventricular wall thickness. Right ventricular systolic function is normal. There is normal pulmonary artery systolic pressure. The tricuspid regurgitant velocity is 2.63 m/s, and  with an assumed right atrial pressure of 3 mmHg, the estimated right ventricular systolic pressure is 30.7 mmHg. Left Atrium: Left atrial size was normal in size. Right Atrium: Right atrial size was normal in size. Pericardium: There is no evidence of pericardial effusion. Mitral Valve: The mitral valve is normal in structure. No evidence of mitral valve regurgitation. No evidence of mitral valve stenosis. Tricuspid Valve: The tricuspid valve is normal in structure. Tricuspid valve regurgitation is mild . No evidence of tricuspid stenosis. Aortic Valve: The aortic valve is tricuspid. Aortic valve regurgitation is not visualized. No aortic stenosis is present. Pulmonic Valve: The pulmonic valve was grossly normal. Pulmonic valve regurgitation is not visualized. No evidence of pulmonic stenosis. Aorta: The aortic root and ascending aorta are structurally normal, with no evidence of dilitation. IAS/Shunts: No atrial level shunt detected by color flow Doppler. EKG: Rhythm strip during this exam demostrated atrial flutter.  LEFT VENTRICLE PLAX 2D LVIDd:         5.70 cm     Diastology LVIDs:         3.60 cm     LV e' medial:    10.20 cm/s LV PW:         1.00 cm     LV E/e' medial:  7.8 LV IVS:        1.00 cm     LV e' lateral:   11.20 cm/s LVOT diam:     2.20 cm     LV E/e' lateral: 7.1 LV SV:         57 LV SV Index:   28 LVOT Area:     3.80 cm  LV Volumes (MOD) LV vol d, MOD A4C: 80.7 ml LV vol s, MOD A4C: 28.3 ml LV SV MOD A4C:     80.7 ml RIGHT VENTRICLE RV Basal diam:  2.60 cm RV S  prime:     12.10 cm/s TAPSE (M-mode): 1.9 cm LEFT ATRIUM             Index       RIGHT ATRIUM           Index LA diam:        3.40 cm 1.66 cm/m  RA Area:     11.10 cm LA Vol (A2C):   25.3 ml 12.38 ml/m RA Volume:   25.30 ml  12.38 ml/m LA Vol (A4C):   36.0 ml 17.61 ml/m LA Biplane Vol: 32.9 ml 16.10 ml/m  AORTIC VALVE LVOT Vmax:   90.10 cm/s LVOT Vmean:  61.900 cm/s LVOT VTI:    0.149 m  AORTA Ao Root diam: 2.90 cm MITRAL VALVE               TRICUSPID VALVE MV Area (PHT): 6.43 cm    TR Peak grad:   27.7 mmHg MV Decel Time: 118 msec    TR Vmax:        263.00 cm/s MV E velocity: 79.70 cm/s MV A velocity: 69.00 cm/s  SHUNTS MV E/A ratio:  1.16        Systemic VTI:  0.15 m                            Systemic Diam: 2.20 cm Pate Aylward DO Electronically signed by Tessa Lerner DO Signature Date/Time: 04/29/2021/2:36:30 PM    Final    US Abdomen Limited RUQ (LIVER/GB)  Result Date: 04/28/2021 CLINICAL DATA:  Elevated LFTs. EXAM: ULTRASOUND ABDOMEN LIMITED RIGHT UPPER QUADRANT COMPARISON:  None. FINDINGS: Gallbladder: No gallstones or wall thickening visualized. No sonographic Murphy sign noted by sonographer. Common bile duct: Diameter: 7 mm Liver: There is diffuse increased liver echogenicity most commonly seen in the setting of fatty infiltration. Superimposed inflammation or fibrosis is not excluded. Clinical correlation is recommended. Portal vein is patent on color Doppler imaging with normal direction of blood flow towards the liver. Other: None. IMPRESSION: Fatty liver, otherwise unremarkable right upper quadrant ultrasound. Electronically Signed   By: Elgie Collard M.D.   On: 04/28/2021 20:29    CARDIAC DATABASE: EKG: 04/28/2021: Atrial flutter with variable block, 112 bpm, injury pattern.   04/29/2021: Atrial flutter with variable block, 108 bpm, without underlying injury pattern.   Echocardiogram: 02/27/2019: Normal LV systolic function with EF 55%. Left ventricle cavity is normal in size.  Normal left ventricular wall thickness. Normal global wall motion. Indeterminate diastolic filling pattern. Mild tricuspid regurgitation. Estimated pulmonary artery systolic pressure is 26 mmHg.   04/29/2021:  1. Left ventricular ejection fraction, by estimation, is 60 to 65%. The left ventricle has normal function. The left ventricle has no regional wall motion abnormalities. Left ventricular diastolic function could not be evaluated due to atrial flutter.   2. Right ventricular systolic function is normal. The right ventricular size is normal. There is normal pulmonary artery systolic pressure.   3. The mitral valve is normal in structure. No evidence of mitral valve regurgitation. No evidence of mitral stenosis.   4. The aortic valve is tricuspid. Aortic valve regurgitation is not visualized. No aortic stenosis is present.    Stress Testing:  Exercise myoview stress test  04/23/2019: Resting EKG NSR.  Stress EKG is negative for ischemia. There were occasional PVCs and paroxysmal episodes of brief 3-8 beat atrial tachycardia during stress testing and into recovery. Patient exercised for a total of 6:13 min., achieving approximately 7 METs. Normal BP response. Exercise was terminated due to fatigue/weakness and achieving THR. Normal myocardial perfusion. All segments of left ventricle demonstrated normal wall motion and thickening. Stress LV EF is hyperdynamic 75%.  No previous exam available for comparison. Low risk study.   Scheduled Meds:  apixaban  5 mg Oral BID   docusate sodium  100 mg Oral BID   hydrochlorothiazide  12.5 mg Oral Daily   insulin aspart  0-15 Units Subcutaneous TID WC   insulin aspart  0-5 Units Subcutaneous QHS   insulin aspart  4 Units Subcutaneous TID WC   levothyroxine  100 mcg Oral Q0600   metoprolol tartrate  12.5 mg Oral BID   rosuvastatin  20 mg Oral QHS   spironolactone  12.5 mg Oral Daily    Continuous Infusions:  heparin 1,800 Units/hr (04/30/21 0747)    lactated ringers Stopped (04/24/21 1218)    PRN Meds: acetaminophen, ALPRAZolam, diphenhydrAMINE, HYDROmorphone (DILAUDID) injection, menthol-cetylpyridinium **OR** phenol, metoCLOPramide **OR** metoCLOPramide (REGLAN) injection, ondansetron **OR** ondansetron (ZOFRAN) IV, oxyCODONE, polyethylene glycol, sodium phosphate, sorbitol   IMPRESSION & RECOMMENDATIONS: Norman Hudson. is a 70 y.o. male whose past medical history and cardiac risk factors  include: prediabetes, hypertension, hyperlipidemia, chronically low testosterone, hypothyroidism, former smoker with 15 pack year history, anxiety, osteoarthritis, history of TIA, advanced age.  Atrial flutter, paroxysmal Rate control: Metoprolol. Rhythm control: N/A. Thromboembolic prophylaxis Eliquis Ventricular rate has improved after the initiation of metoprolol. EKG to evaluate the underlying rhythm Recommend rate control strategy for now.  We will reevaluate him in 4 weeks to see if he has spontaneously converted to normal sinus rhythm or if direct-current cardioversion needs to be considered. Plan of care discussed with both the patient and his wife at bedside.  Long-term oral anticoagulation: Indication: Atrial flutter CHA2DS2-VASc SCORE is 4 which correlates to 4.6% risk of stroke per year history of TIA, age, hypertension). Risks, benefits, and alternatives to oral anticoagulation we discussed with both the patient and his wife during morning rounds.  They are agreeable and wished to proceed with anticoagulation at this time to reduce his risk of thromboembolic event  Orthostatic hypotension:  Resolved For now I have discontinued amlodipine. And reduced the doses of spironolactone and hydrochlorothiazide to prevent hypotension  Benign essential hypertension: Medications reconciled blood pressure over the last 24 hours remains stable  Postop day 6 from right total hip replacement posterior approach: Currently managed by  primary team.  History of TIA: Will discontinue aspirin and Plavix to minimize risk of bleeding. Status post hip replacement and has received 2 units of packed red blood cells. Given the new onset of atrial flutter is currently on oral anticoagulation. Educated importance of secondary prevention.  From a cardiovascular standpoint patient is stable to be discharged home.  I have made a follow-up appointment to see me back in the office in 4 weeks on 06/04/2021 at 2 PM.  EKG will be performed on arrival.  I will discuss management of his atrial flutter.  Answered questions and concerns that both the patient and his wife had during morning rounds to the satisfaction.  Spoke to Dr. Isidoro Donning that from cardiovascular standpoint he is stable to go home.  May consider [facility] pharmacy for medications delivery prior to discharge.  This note was created using a voice recognition software as a result there may be grammatical errors inadvertently enclosed that do not reflect the nature of this encounter. Every attempt is made to correct such errors.  Total time spent: 35 minutes.  Delilah Shan [facility]  Pager: [phone] Office: [phone] 04/30/2021, 9:31 AM

## 2021-04-30 NOTE — Progress Notes (Signed)
Physical Therapy Treatment Patient Details Name: Norman Hudson. 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    PT Comments    POD # 6 am session Pt plans to D/C to home today. Assisted with amb in hallway, practiced one step Then returned to room to perform some TE's following HEP handout.  Instructed on proper tech, freq as well as use of ICE.  Pt aware of his THP.  Addressed all mobility questions, discussed appropriate activity, educated on use of ICE.  Pt ready for D/C to home.   Recommendations for follow up therapy are one component of a multi-disciplinary discharge planning process, led by the attending physician.  Recommendations may be updated based on patient status, additional functional criteria and insurance authorization.  Follow Up Recommendations  Home health PT     Equipment Recommendations  None recommended by PT    Recommendations for Other Services       Precautions / Restrictions Precautions Precautions: Posterior Hip;Fall Precaution Booklet Issued: Yes (comment) Precaution Comments: Pt recalls all THP Restrictions Weight Bearing Restrictions: No RLE Weight Bearing: Weight bearing as tolerated     Mobility  Bed Mobility Overal bed mobility: Modified Independent Bed Mobility: Supine to Sit     Supine to sit: Modified independent (Device/Increase time);HOB elevated     General bed mobility comments: OOB in recliner    Transfers Overall transfer level: Needs assistance Equipment used: Rolling walker (2 wheeled) Transfers: Sit to/from Stand Sit to Stand: Supervision Stand pivot transfers: Supervision       General transfer comment: good safety cognition and awareness to adhere to his THP.  Ambulation/Gait Ambulation/Gait assistance: Supervision Gait Distance (Feet): 145 Feet Assistive device:  Rolling walker (2 wheeled) Gait Pattern/deviations: Step-to pattern;Decreased step length - right;Decreased step length - left;Shuffle;Trunk flexed Gait velocity: decreased   General Gait Details: tolerated an increased distance with good safety cognition and balance.  Vitals during activity BP 117/85  hHR 115  no complaints "feeling fine".   Stairs   Stairs assistance: Supervision Stair Management: No rails;Step to pattern;Forwards;With walker Number of Stairs: 1 General stair comments: practiced one step he has to enter home while spouse observered.  only one VC' on proper sequencing as well as walker placement.  Pt was correctly able to recall from yesterday.   Wheelchair Mobility    Modified Rankin (Stroke Patients Only)       Balance Overall balance assessment: Needs assistance Sitting-balance support: Feet supported Sitting balance-Leahy Scale: Good     Standing balance support: Single extremity supported;Bilateral upper extremity supported Standing balance-Leahy Scale: Poor Standing balance comment: reliant on at least unilateral UE support                            Cognition Arousal/Alertness: Awake/alert Behavior During Therapy: WFL for tasks assessed/performed Overall Cognitive Status: Within Functional Limits for tasks assessed                                 General Comments: AxO x 3 very motivated/pleasant      Exercises  Total Hip Replacement TE's following HEP Handout 10 reps ankle pumps 05 reps knee presses 05 reps heel slides 05 reps SAQ's 05 reps ABD Instructed how to use  a belt loop to assist  Followed by ICE     General Comments        Pertinent Vitals/Pain Pain Assessment: 0-10 Pain Score: 5  Faces Pain Scale: Hurts a little bit Pain Location: R hip Pain Descriptors / Indicators: Grimacing;Operative site guarding;Tender Pain Intervention(s): Monitored during session;Premedicated before  session;Repositioned;Ice applied    Home Living                      Prior Function            PT Goals (current goals can now be found in the care plan section) Acute Rehab PT Goals Patient Stated Goal: Regain IND Progress towards PT goals: Progressing toward goals    Frequency    7X/week      PT Plan Current plan remains appropriate    Co-evaluation              AM-PAC PT "6 Clicks" Mobility   Outcome Measure  Help needed turning from your back to your side while in a flat bed without using bedrails?: A Little Help needed moving from lying on your back to sitting on the side of a flat bed without using bedrails?: A Little Help needed moving to and from a bed to a chair (including a wheelchair)?: A Little Help needed standing up from a chair using your arms (e.g., wheelchair or bedside chair)?: A Little Help needed to walk in hospital room?: A Little Help needed climbing 3-5 steps with a railing? : A Little 6 Click Score: 18    End of Session Equipment Utilized During Treatment: Gait belt Activity Tolerance: Patient tolerated treatment well Patient left: in chair;with call bell/phone within reach Nurse Communication: Mobility status (pt has mew goeald to safely D/C to home after this one session.) PT Visit Diagnosis: Unsteadiness on feet (R26.81);Difficulty in walking, not elsewhere classified (R26.2);Muscle weakness (generalized) (M62.81)     Time: 1120-1200 PT Time Calculation (min) (ACUTE ONLY): 40 min  Charges:  $Gait Training: 8-22 mins $Therapeutic Exercise: 8-22 mins $Therapeutic Activity: 8-22 mins                     Felecia Shelling  PTA Acute  Rehabilitation Services Pager      [phone] Office      [phone]

## 2021-04-30 NOTE — Progress Notes (Addendum)
[place] for IV heparin Indication: atrial flutter  Allergies  Allergen Reactions   Bactrim [Sulfamethoxazole-Trimethoprim]     "threw me for a loop, hives, itching, extreme fatigue"    Patient Measurements: Height: 5\' 6"  (167.6 cm) Weight: 95.5 kg (210 lb 8.6 oz) IBW/kg (Calculated) : 63.8 Heparin Dosing Weight: 84.5 kg  Vital Signs: Temp: 99.4 F (37.4 C) (09/22 0458) BP: 124/79 (09/22 0458) Pulse Rate: 95 (09/22 0458)  Labs: Recent Labs    04/28/21 0315 04/28/21 1223 04/29/21 0847 04/29/21 2026 04/30/21 0605  HGB 9.9* 11.0* 11.1*  --  11.0*  HCT 27.6* 31.3* 32.1*  --  31.8*  PLT 125*  --  159  --  170  HEPARINUNFRC  --   --   --  <0.10* <0.10*  CREATININE  --  0.92 0.87  --  0.91     Estimated Creatinine Clearance: 80.6 mL/min (by C-G formula based on SCr of 0.91 mg/dL).  Assessment: 70 y/o M with PMH of HTN, HLD, DM2, hypothyroidism, anxiety who presented with right hip pain and osteoarthritis of the right hip, s/p R THA on 04/24/21. Patient did have acute blood loss anemia from surgery and was transfused 2 units PRBCs. Hgb has improved to 11.1 today. Pltc WNL. Pharmacy consulted today for IV heparin dosing for atrial flutter. Patient was on Aspirin 81mg  PO BID post-operatively for DVT prophylaxis, which has been discontinued by MD. Patient not on any anticoagulants PTA.  Today, 04/30/2021: AM heparin level still < 0.1 units/mL, undetectable despite rate increase to 1500 units/hr Hgb low but stable at 11; Pltc WNL No bleeding issues per nursing No interruptions in therapy or IV site issues per nursing  Goal of Therapy:  Heparin level 0.3-0.7 units/ml Monitor platelets by anticoagulation protocol: Yes   Plan:  Increase IV heparin infusion to 1800 units/hr (no bolus per MD request) Recheck heparin level in 8 hours Daily CBC, heparin level Monitor closely for s/sx of bleeding Noted plans to switch to Apixaban per  Cardiology    Lindell Spar, PharmD, BCPS Clinical Pharmacist 04/30/2021, 7:11 AM   Addendum: Asked to transition to Apixaban.  Age: 70 years old Weight: 95.5 kg SCr: 0.91  Plan: Stop heparin infusion At same time, start Apixaban 5mg  PO BID Monitor CBC and for s/sx of bleeding Pharmacy to provide education and free 30 day coupon prior to discharge   Lindell Spar, PharmD, BCPS Clinical Pharmacist  04/30/2021 9:10 AM

## 2021-04-30 NOTE — Progress Notes (Signed)
of viral mutation(s) within the areas targeted by this assay, and inadequate number of viral copies (<250 copies / mL). A negative result must be combined with clinical observations, patient history, and epidemiological information.  Fact Sheet for Patients:   StrictlyIdeas.no  Fact Sheet for Healthcare Providers: BankingDealers.co.za  This test is not yet approved or  cleared by the [place] FDA and has been authorized for detection and/or diagnosis of SARS-CoV-2 by FDA under an Emergency Use Authorization (EUA).  This EUA will remain in effect (meaning this test can be used) for the duration of the COVID-19 declaration under Section 564(b)(1) of the Act, 21 U.S.C. section 360bbb-3(b)(1), unless the authorization is terminated or revoked sooner.  Performed at [facility], [place] [address] [place], [place] [address]   Culture, blood (routine x 2)     Status: None (Preliminary result)   Collection Time: 04/28/21 12:25 PM   Specimen: BLOOD  Result Value Ref Range Status   Specimen Description   Final    BLOOD RIGHT ANTECUBITAL Performed at [facility] [address], [place], [place] [address]    Special Requests    Final    BOTTLES DRAWN AEROBIC ONLY Blood Culture adequate volume Performed at [facility] [address] [place], [place] [address]    Culture   Final    NO GROWTH < 24 HOURS Performed at [facility] [address] [place], [place] [address]    Report Status PENDING  Incomplete  Culture, blood (routine x 2)     Status: None (Preliminary result)   Collection Time: 04/28/21 12:25 PM   Specimen: BLOOD  Result Value Ref Range Status   Specimen Description   Final    BLOOD BLOOD RIGHT HAND Performed at [facility] [address], [place], [place] [address]    Special Requests   Final    BOTTLES DRAWN AEROBIC ONLY Blood Culture adequate volume Performed at [facility] [address] [place], [place] [address]    Culture   Final    NO GROWTH < 24 HOURS Performed at [facility] [address], [place], [place] [address]    Report Status PENDING  Incomplete    Radiology Reports DG Pelvis Portable  Result Date: 04/24/2021 CLINICAL DATA:  Status post right hip total arthroplasty EXAM: PORTABLE PELVIS 1-2 VIEWS; DG HIP (WITH OR WITHOUT PELVIS) 1V PORT RIGHT COMPARISON:  None. FINDINGS: Status post right hip total arthroplasty with expected overlying postoperative change. No evidence of perihardware fracture or component loosening. Additionally status post remote left hip total arthroplasty. The bony pelvis is unremarkable in single frontal view. Nonobstructive pattern of overlying bowel gas. IMPRESSION: Status post right hip total arthroplasty with expected overlying postoperative change. No evidence of perihardware fracture or component loosening. Electronically Signed   By: Eddie Candle M.D.   On: 04/24/2021 13:35   ECHOCARDIOGRAM COMPLETE  Result Date: 04/29/2021    ECHOCARDIOGRAM REPORT   Patient Name:   Norman Hudson. Date of Exam: 04/29/2021 Medical Rec #:  [id number]                   Height:        66.0 in Accession #:    [id number]                  Weight:       210.5 lb Date of Birth:  02-20-1951  [facility] Consult Progress note                                                                               Patient Demographics  Norman Hudson, is a 70 y.o. male, DOB - 1950-08-29, QBH:[id number]  Admit date - 04/24/2021   Admitting Physician Earlie Server, MD  Outpatient Primary MD for the patient is Glenis Smoker, MD  Outpatient specialists:   LOS - 4  days   Medical records reviewed and are as summarized below:    No chief complaint on file.      Brief summary   Patient is a 70 year old male with hypertension, hyperlipidemia, diabetes mellitus type 2, hypothyroidism, anxiety had presented with right hip pain and osteoarthritis, underwent total hip replacement on 9/16.  TRH was consulted on 9/20 due to dizziness, orthostasis, tachycardia. Hemoglobin down to 7.6 on 9/19, received 2 units packed RBCs. Received IV fluid hydration, orthostasis improved however still tachycardiac   Assessment & Plan       Degenerative joint disease of right hip -Status post total hip replacement on 9/16 -Management per orthopedics, the primary service     Postoperative anemia due to acute blood loss -Post surgery, hemoglobin down to 7.6 on 9/19, received 2 units packed RBCs and responded well -Hemoglobin stable 11.0.,  Will place on eliquis 5 mg twice daily  New onset atrial flutter -Noted after patient had persistent tachycardia despite IV fluids and packed RBC transfusion -EKG shows persistent atrial flutter, seen by cardiology  -Discontinued Norvasc, reduced Aldactone, HCTZ, changed to metoprolol.   -BP, heart rate stable  -Tolerated heparin drip overnight, hemoglobin stable.  I had discussed with orthopedics PA, the primary service, cleared for DOAC -Heparin drip discontinued, started on Eliquis 5 mg twice daily -Patient has appointment with Dr. Terri Skains, outpatient scheduled -No aspirin or Plavix while on Eliquis.  This was explained to the  patient as well as all the medication changes  Essential hypertension -Currently stable  Diabetes mellitus type 2 -CBGs currently stable, hemoglobin A1c 6.0 on 04/16/2021 -Continue sliding scale insulin while inpatient  Hypothyroidism -Continue Synthroid, TSH 3.9.  Anxiety  -Continue Xanax, prn  Estimated body mass index is 33.98 kg/m as calculated from the following:   Height as of this encounter: 5\' 6"  (1.676 m).   Weight as of this encounter: 95.5 kg.  Code Status: Full CODE STATUS DVT Prophylaxis:  SCDs Start: 04/24/21 1207 Place TED hose Start: 04/24/21 1207 apixaban (ELIQUIS) tablet 5 mg   IV heparin drip  Level of Care: Level of care: Med-Surg Family Communication: Discussed all imaging results, lab results, explained to the patient and wife at the bedside   Disposition Plan:     Status is: Inpatient  Remains inpatient appropriate because:Inpatient level of care appropriate due to severity of illness  Disposition : Patient is cleared from medical and cardiac standpoint to be discharged home.  Tolerated heparin drip, hence transition to oral eliquis.  All the medication changes and eliquis, all prescriptions sent to his pharmacy.  Sent page to RN to notify orthopedics that patient can be discharged when stable from their standpoint. I  of viral mutation(s) within the areas targeted by this assay, and inadequate number of viral copies (<250 copies / mL). A negative result must be combined with clinical observations, patient history, and epidemiological information.  Fact Sheet for Patients:   StrictlyIdeas.no  Fact Sheet for Healthcare Providers: BankingDealers.co.za  This test is not yet approved or  cleared by the [place] FDA and has been authorized for detection and/or diagnosis of SARS-CoV-2 by FDA under an Emergency Use Authorization (EUA).  This EUA will remain in effect (meaning this test can be used) for the duration of the COVID-19 declaration under Section 564(b)(1) of the Act, 21 U.S.C. section 360bbb-3(b)(1), unless the authorization is terminated or revoked sooner.  Performed at [facility], [place] [address] [place], [place] [address]   Culture, blood (routine x 2)     Status: None (Preliminary result)   Collection Time: 04/28/21 12:25 PM   Specimen: BLOOD  Result Value Ref Range Status   Specimen Description   Final    BLOOD RIGHT ANTECUBITAL Performed at [facility] [address], [place], [place] [address]    Special Requests    Final    BOTTLES DRAWN AEROBIC ONLY Blood Culture adequate volume Performed at [facility] [address] [place], [place] [address]    Culture   Final    NO GROWTH < 24 HOURS Performed at [facility] [address] [place], [place] [address]    Report Status PENDING  Incomplete  Culture, blood (routine x 2)     Status: None (Preliminary result)   Collection Time: 04/28/21 12:25 PM   Specimen: BLOOD  Result Value Ref Range Status   Specimen Description   Final    BLOOD BLOOD RIGHT HAND Performed at [facility] [address], [place], [place] [address]    Special Requests   Final    BOTTLES DRAWN AEROBIC ONLY Blood Culture adequate volume Performed at [facility] [address] [place], [place] [address]    Culture   Final    NO GROWTH < 24 HOURS Performed at [facility] [address], [place], [place] [address]    Report Status PENDING  Incomplete    Radiology Reports DG Pelvis Portable  Result Date: 04/24/2021 CLINICAL DATA:  Status post right hip total arthroplasty EXAM: PORTABLE PELVIS 1-2 VIEWS; DG HIP (WITH OR WITHOUT PELVIS) 1V PORT RIGHT COMPARISON:  None. FINDINGS: Status post right hip total arthroplasty with expected overlying postoperative change. No evidence of perihardware fracture or component loosening. Additionally status post remote left hip total arthroplasty. The bony pelvis is unremarkable in single frontal view. Nonobstructive pattern of overlying bowel gas. IMPRESSION: Status post right hip total arthroplasty with expected overlying postoperative change. No evidence of perihardware fracture or component loosening. Electronically Signed   By: Eddie Candle M.D.   On: 04/24/2021 13:35   ECHOCARDIOGRAM COMPLETE  Result Date: 04/29/2021    ECHOCARDIOGRAM REPORT   Patient Name:   Norman Hudson. Date of Exam: 04/29/2021 Medical Rec #:  [id number]                   Height:        66.0 in Accession #:    [id number]                  Weight:       210.5 lb Date of Birth:  02-20-1951  of viral mutation(s) within the areas targeted by this assay, and inadequate number of viral copies (<250 copies / mL). A negative result must be combined with clinical observations, patient history, and epidemiological information.  Fact Sheet for Patients:   StrictlyIdeas.no  Fact Sheet for Healthcare Providers: BankingDealers.co.za  This test is not yet approved or  cleared by the [place] FDA and has been authorized for detection and/or diagnosis of SARS-CoV-2 by FDA under an Emergency Use Authorization (EUA).  This EUA will remain in effect (meaning this test can be used) for the duration of the COVID-19 declaration under Section 564(b)(1) of the Act, 21 U.S.C. section 360bbb-3(b)(1), unless the authorization is terminated or revoked sooner.  Performed at [facility], [place] [address] [place], [place] [address]   Culture, blood (routine x 2)     Status: None (Preliminary result)   Collection Time: 04/28/21 12:25 PM   Specimen: BLOOD  Result Value Ref Range Status   Specimen Description   Final    BLOOD RIGHT ANTECUBITAL Performed at [facility] [address], [place], [place] [address]    Special Requests    Final    BOTTLES DRAWN AEROBIC ONLY Blood Culture adequate volume Performed at [facility] [address] [place], [place] [address]    Culture   Final    NO GROWTH < 24 HOURS Performed at [facility] [address] [place], [place] [address]    Report Status PENDING  Incomplete  Culture, blood (routine x 2)     Status: None (Preliminary result)   Collection Time: 04/28/21 12:25 PM   Specimen: BLOOD  Result Value Ref Range Status   Specimen Description   Final    BLOOD BLOOD RIGHT HAND Performed at [facility] [address], [place], [place] [address]    Special Requests   Final    BOTTLES DRAWN AEROBIC ONLY Blood Culture adequate volume Performed at [facility] [address] [place], [place] [address]    Culture   Final    NO GROWTH < 24 HOURS Performed at [facility] [address], [place], [place] [address]    Report Status PENDING  Incomplete    Radiology Reports DG Pelvis Portable  Result Date: 04/24/2021 CLINICAL DATA:  Status post right hip total arthroplasty EXAM: PORTABLE PELVIS 1-2 VIEWS; DG HIP (WITH OR WITHOUT PELVIS) 1V PORT RIGHT COMPARISON:  None. FINDINGS: Status post right hip total arthroplasty with expected overlying postoperative change. No evidence of perihardware fracture or component loosening. Additionally status post remote left hip total arthroplasty. The bony pelvis is unremarkable in single frontal view. Nonobstructive pattern of overlying bowel gas. IMPRESSION: Status post right hip total arthroplasty with expected overlying postoperative change. No evidence of perihardware fracture or component loosening. Electronically Signed   By: Eddie Candle M.D.   On: 04/24/2021 13:35   ECHOCARDIOGRAM COMPLETE  Result Date: 04/29/2021    ECHOCARDIOGRAM REPORT   Patient Name:   Norman Hudson. Date of Exam: 04/29/2021 Medical Rec #:  [id number]                   Height:        66.0 in Accession #:    [id number]                  Weight:       210.5 lb Date of Birth:  02-20-1951  [facility] Consult Progress note                                                                               Patient Demographics  Norman Hudson, is a 70 y.o. male, DOB - 1950-08-29, QBH:[id number]  Admit date - 04/24/2021   Admitting Physician Earlie Server, MD  Outpatient Primary MD for the patient is Glenis Smoker, MD  Outpatient specialists:   LOS - 4  days   Medical records reviewed and are as summarized below:    No chief complaint on file.      Brief summary   Patient is a 70 year old male with hypertension, hyperlipidemia, diabetes mellitus type 2, hypothyroidism, anxiety had presented with right hip pain and osteoarthritis, underwent total hip replacement on 9/16.  TRH was consulted on 9/20 due to dizziness, orthostasis, tachycardia. Hemoglobin down to 7.6 on 9/19, received 2 units packed RBCs. Received IV fluid hydration, orthostasis improved however still tachycardiac   Assessment & Plan       Degenerative joint disease of right hip -Status post total hip replacement on 9/16 -Management per orthopedics, the primary service     Postoperative anemia due to acute blood loss -Post surgery, hemoglobin down to 7.6 on 9/19, received 2 units packed RBCs and responded well -Hemoglobin stable 11.0.,  Will place on eliquis 5 mg twice daily  New onset atrial flutter -Noted after patient had persistent tachycardia despite IV fluids and packed RBC transfusion -EKG shows persistent atrial flutter, seen by cardiology  -Discontinued Norvasc, reduced Aldactone, HCTZ, changed to metoprolol.   -BP, heart rate stable  -Tolerated heparin drip overnight, hemoglobin stable.  I had discussed with orthopedics PA, the primary service, cleared for DOAC -Heparin drip discontinued, started on Eliquis 5 mg twice daily -Patient has appointment with Dr. Terri Skains, outpatient scheduled -No aspirin or Plavix while on Eliquis.  This was explained to the  patient as well as all the medication changes  Essential hypertension -Currently stable  Diabetes mellitus type 2 -CBGs currently stable, hemoglobin A1c 6.0 on 04/16/2021 -Continue sliding scale insulin while inpatient  Hypothyroidism -Continue Synthroid, TSH 3.9.  Anxiety  -Continue Xanax, prn  Estimated body mass index is 33.98 kg/m as calculated from the following:   Height as of this encounter: 5\' 6"  (1.676 m).   Weight as of this encounter: 95.5 kg.  Code Status: Full CODE STATUS DVT Prophylaxis:  SCDs Start: 04/24/21 1207 Place TED hose Start: 04/24/21 1207 apixaban (ELIQUIS) tablet 5 mg   IV heparin drip  Level of Care: Level of care: Med-Surg Family Communication: Discussed all imaging results, lab results, explained to the patient and wife at the bedside   Disposition Plan:     Status is: Inpatient  Remains inpatient appropriate because:Inpatient level of care appropriate due to severity of illness  Disposition : Patient is cleared from medical and cardiac standpoint to be discharged home.  Tolerated heparin drip, hence transition to oral eliquis.  All the medication changes and eliquis, all prescriptions sent to his pharmacy.  Sent page to RN to notify orthopedics that patient can be discharged when stable from their standpoint. I  of viral mutation(s) within the areas targeted by this assay, and inadequate number of viral copies (<250 copies / mL). A negative result must be combined with clinical observations, patient history, and epidemiological information.  Fact Sheet for Patients:   StrictlyIdeas.no  Fact Sheet for Healthcare Providers: BankingDealers.co.za  This test is not yet approved or  cleared by the [place] FDA and has been authorized for detection and/or diagnosis of SARS-CoV-2 by FDA under an Emergency Use Authorization (EUA).  This EUA will remain in effect (meaning this test can be used) for the duration of the COVID-19 declaration under Section 564(b)(1) of the Act, 21 U.S.C. section 360bbb-3(b)(1), unless the authorization is terminated or revoked sooner.  Performed at [facility], [place] [address] [place], [place] [address]   Culture, blood (routine x 2)     Status: None (Preliminary result)   Collection Time: 04/28/21 12:25 PM   Specimen: BLOOD  Result Value Ref Range Status   Specimen Description   Final    BLOOD RIGHT ANTECUBITAL Performed at [facility] [address], [place], [place] [address]    Special Requests    Final    BOTTLES DRAWN AEROBIC ONLY Blood Culture adequate volume Performed at [facility] [address] [place], [place] [address]    Culture   Final    NO GROWTH < 24 HOURS Performed at [facility] [address] [place], [place] [address]    Report Status PENDING  Incomplete  Culture, blood (routine x 2)     Status: None (Preliminary result)   Collection Time: 04/28/21 12:25 PM   Specimen: BLOOD  Result Value Ref Range Status   Specimen Description   Final    BLOOD BLOOD RIGHT HAND Performed at [facility] [address], [place], [place] [address]    Special Requests   Final    BOTTLES DRAWN AEROBIC ONLY Blood Culture adequate volume Performed at [facility] [address] [place], [place] [address]    Culture   Final    NO GROWTH < 24 HOURS Performed at [facility] [address], [place], [place] [address]    Report Status PENDING  Incomplete    Radiology Reports DG Pelvis Portable  Result Date: 04/24/2021 CLINICAL DATA:  Status post right hip total arthroplasty EXAM: PORTABLE PELVIS 1-2 VIEWS; DG HIP (WITH OR WITHOUT PELVIS) 1V PORT RIGHT COMPARISON:  None. FINDINGS: Status post right hip total arthroplasty with expected overlying postoperative change. No evidence of perihardware fracture or component loosening. Additionally status post remote left hip total arthroplasty. The bony pelvis is unremarkable in single frontal view. Nonobstructive pattern of overlying bowel gas. IMPRESSION: Status post right hip total arthroplasty with expected overlying postoperative change. No evidence of perihardware fracture or component loosening. Electronically Signed   By: Eddie Candle M.D.   On: 04/24/2021 13:35   ECHOCARDIOGRAM COMPLETE  Result Date: 04/29/2021    ECHOCARDIOGRAM REPORT   Patient Name:   Norman Hudson. Date of Exam: 04/29/2021 Medical Rec #:  [id number]                   Height:        66.0 in Accession #:    [id number]                  Weight:       210.5 lb Date of Birth:  02-20-1951

## 2021-04-30 NOTE — Discharge Summary (Signed)
116/76 98.1 F (36.7 C) -- 97 18 100 %       Recent laboratory studies: Recent Labs    04/25/21 0307 04/27/21 0934 04/28/21 0315 04/28/21 1223 04/29/21 0847 04/30/21 0605  WBC 11.1* 8.7 7.0  --  6.3 7.8  HGB 8.6* 7.6* 9.9* 11.0* 11.1* 11.0*  HCT 25.1* 21.7* 27.6* 31.3* 32.1* 31.8*  PLT 134* 130* 125*  --  159 170   Recent Labs    04/25/21 0307 04/28/21 1223 04/29/21 0847 04/30/21 0605  NA 134* 137 139 137  K 3.8 4.1 4.2 4.0  CL 101 106 109 108  CO2 24 24 24 24   BUN 18 18 15 16   CREATININE 1.02 0.92 0.87 0.91  GLUCOSE 140* 160* 180* 138*  CALCIUM 8.6* 8.8* 8.6* 8.5*   Lab Results  Component Value Date   INR 1.1 04/16/2021   INR 1.0 07/08/2019   INR 1.14 07/12/2016     Recent Radiographic Studies :  DG  Pelvis Portable  Result Date: 04/24/2021 CLINICAL DATA:  Status post right hip total arthroplasty EXAM: PORTABLE PELVIS 1-2 VIEWS; DG HIP (WITH OR WITHOUT PELVIS) 1V PORT RIGHT COMPARISON:  None. FINDINGS: Status post right hip total arthroplasty with expected overlying postoperative change. No evidence of perihardware fracture or component loosening. Additionally status post remote left hip total arthroplasty. The bony pelvis is unremarkable in single frontal view. Nonobstructive pattern of overlying bowel gas. IMPRESSION: Status post right hip total arthroplasty with expected overlying postoperative change. No evidence of perihardware fracture or component loosening. Electronically Signed   By: Eddie Candle M.D.   On: 04/24/2021 13:35   ECHOCARDIOGRAM COMPLETE  Result Date: 04/29/2021    ECHOCARDIOGRAM REPORT   Patient Name:   Norman Hudson. Date of Exam: 04/29/2021 Medical Rec #:  [id number]                   Height:       66.0 in Accession #:    [id number]                  Weight:       210.5 lb Date of Birth:  Mar 04, 1951                   BSA:          2.044 m Patient Age:    70 years                    BP:           120/72 mmHg Patient Gender: M                           HR:           113 bpm. Exam Location:  Inpatient Procedure: 2D Echo, Cardiac Doppler and Color Doppler Indications:     Atrial flutter  History:         Patient has prior history of Echocardiogram examinations, most                  recent 02/27/2019. TIA, Arrythmias:Atrial Flutter,                  Signs/Symptoms:Anemia; Risk Factors:Hypertension and                  Dyslipidemia. Post op hip replacement.  Sonographer:     Dustin Flock RDCS Referring Phys:  [phone]  PATIENT ID: Norman Hudson.        MRN:  [id number]          DOB/AGE: 1950-11-30 / 70 y.o.    DISCHARGE SUMMARY  ADMISSION DATE:    04/24/2021 DISCHARGE DATE:   04/30/2021   ADMISSION DIAGNOSIS: Degenerative joint disease of right hip [M16.11]    DISCHARGE DIAGNOSIS:  OA RIGHT HIP    ADDITIONAL DIAGNOSIS: Active Problems:   Degenerative joint disease of right hip   Postoperative anemia due to acute blood loss   Atrial flutter, paroxysmal (HCC)   Long term (current) use of anticoagulants   Orthostatic hypotension   Benign hypertension   S/P total right hip arthroplasty   History of TIA (transient ischemic attack)  Past Medical History:  Diagnosis Date   Acne    Anxiety    takes Xanax daily as needed   Arthritis    History of kidney stones    Hyperlipidemia    takes Pravastatin daily   Hypertension    takes Amlodipine and Valsartan-HCTZ daily   Hypothyroidism    takes Synthroid daily   Inguinal hernia    left side   Internal hemorrhoids    Joint pain    Kidney stones    Pre-diabetes    Rosacea    TIA (transient ischemic attack) 06/2019    PROCEDURE: Procedure(s): TOTAL HIP ARTHROPLASTY Right on 04/24/2021  CONSULTS: Medicine, Cardiology, PT, RT    HISTORY:  See H&P in chart  HOSPITAL COURSE:  Trevelle Mcgurn. is a 70 y.o. admitted on 04/24/2021 and found to have a diagnosis of OA RIGHT HIP.  After appropriate laboratory studies were obtained  they were taken to the operating room on 04/24/2021 and underwent  Procedure(s): TOTAL HIP ARTHROPLASTY  Right.   They were given perioperative antibiotics:  Anti-infectives (From admission, onward)    Start     Dose/Rate Route Frequency Ordered Stop   04/24/21 0600  ceFAZolin (ANCEF) IVPB 2g/100 mL premix        2 g 200 mL/hr over 30 Minutes Intravenous On call to O.R. 04/24/21 8101 04/24/21 0816     .  Tolerated the procedure well.  Placed with a foley intraoperatively.      POD #1,  allowed out of bed to a chair.  PT for ambulation and exercise program was delayed due to orthostatic hypotension and feelings of dizziness.  Pain moderately controlled.  Foley D/C'd in morning.    POD #2, continued PT and ambulation, still slow to progress with hypotension, tachycardia, lightheadedness, fluid bolus given.  POD#3, Hgb down to 7.6 and sypmtomatic with ABLA transfused 2 units of PRBC with slight improvement of symptoms.  POD#4, still having tachycardia and orthostatic despite improved Hgb following transfusion.  Consulted Med.  POD#5, found new onset atrial flutter, cardiology consulted and echo performed.  Started on heparin drip with transition to eliquis long term   The remainder of the hospital course was dedicated to ambulation and strengthening.   The patient was discharged on 6 Days Post-Op in  Stable condition.  Blood products given: 2 units CC PRBC  DIAGNOSTIC STUDIES: Recent vital signs: Patient Vitals for the past 24 hrs:  BP Temp Temp src Pulse Resp SpO2  04/30/21 0458 124/79 99.4 F (37.4 C) -- 95 17 98 %  04/29/21 2030 110/72 99.2 F (37.3 C) -- 97 17 99 %  04/29/21 1700 118/72 98.2 F (36.8 C) Oral 96 16 99 %  04/29/21 1303  PATIENT ID: Norman Hudson.        MRN:  [id number]          DOB/AGE: 1950-11-30 / 70 y.o.    DISCHARGE SUMMARY  ADMISSION DATE:    04/24/2021 DISCHARGE DATE:   04/30/2021   ADMISSION DIAGNOSIS: Degenerative joint disease of right hip [M16.11]    DISCHARGE DIAGNOSIS:  OA RIGHT HIP    ADDITIONAL DIAGNOSIS: Active Problems:   Degenerative joint disease of right hip   Postoperative anemia due to acute blood loss   Atrial flutter, paroxysmal (HCC)   Long term (current) use of anticoagulants   Orthostatic hypotension   Benign hypertension   S/P total right hip arthroplasty   History of TIA (transient ischemic attack)  Past Medical History:  Diagnosis Date   Acne    Anxiety    takes Xanax daily as needed   Arthritis    History of kidney stones    Hyperlipidemia    takes Pravastatin daily   Hypertension    takes Amlodipine and Valsartan-HCTZ daily   Hypothyroidism    takes Synthroid daily   Inguinal hernia    left side   Internal hemorrhoids    Joint pain    Kidney stones    Pre-diabetes    Rosacea    TIA (transient ischemic attack) 06/2019    PROCEDURE: Procedure(s): TOTAL HIP ARTHROPLASTY Right on 04/24/2021  CONSULTS: Medicine, Cardiology, PT, RT    HISTORY:  See H&P in chart  HOSPITAL COURSE:  Trevelle Mcgurn. is a 70 y.o. admitted on 04/24/2021 and found to have a diagnosis of OA RIGHT HIP.  After appropriate laboratory studies were obtained  they were taken to the operating room on 04/24/2021 and underwent  Procedure(s): TOTAL HIP ARTHROPLASTY  Right.   They were given perioperative antibiotics:  Anti-infectives (From admission, onward)    Start     Dose/Rate Route Frequency Ordered Stop   04/24/21 0600  ceFAZolin (ANCEF) IVPB 2g/100 mL premix        2 g 200 mL/hr over 30 Minutes Intravenous On call to O.R. 04/24/21 8101 04/24/21 0816     .  Tolerated the procedure well.  Placed with a foley intraoperatively.      POD #1,  allowed out of bed to a chair.  PT for ambulation and exercise program was delayed due to orthostatic hypotension and feelings of dizziness.  Pain moderately controlled.  Foley D/C'd in morning.    POD #2, continued PT and ambulation, still slow to progress with hypotension, tachycardia, lightheadedness, fluid bolus given.  POD#3, Hgb down to 7.6 and sypmtomatic with ABLA transfused 2 units of PRBC with slight improvement of symptoms.  POD#4, still having tachycardia and orthostatic despite improved Hgb following transfusion.  Consulted Med.  POD#5, found new onset atrial flutter, cardiology consulted and echo performed.  Started on heparin drip with transition to eliquis long term   The remainder of the hospital course was dedicated to ambulation and strengthening.   The patient was discharged on 6 Days Post-Op in  Stable condition.  Blood products given: 2 units CC PRBC  DIAGNOSTIC STUDIES: Recent vital signs: Patient Vitals for the past 24 hrs:  BP Temp Temp src Pulse Resp SpO2  04/30/21 0458 124/79 99.4 F (37.4 C) -- 95 17 98 %  04/29/21 2030 110/72 99.2 F (37.3 C) -- 97 17 99 %  04/29/21 1700 118/72 98.2 F (36.8 C) Oral 96 16 99 %  04/29/21 1303  116/76 98.1 F (36.7 C) -- 97 18 100 %       Recent laboratory studies: Recent Labs    04/25/21 0307 04/27/21 0934 04/28/21 0315 04/28/21 1223 04/29/21 0847 04/30/21 0605  WBC 11.1* 8.7 7.0  --  6.3 7.8  HGB 8.6* 7.6* 9.9* 11.0* 11.1* 11.0*  HCT 25.1* 21.7* 27.6* 31.3* 32.1* 31.8*  PLT 134* 130* 125*  --  159 170   Recent Labs    04/25/21 0307 04/28/21 1223 04/29/21 0847 04/30/21 0605  NA 134* 137 139 137  K 3.8 4.1 4.2 4.0  CL 101 106 109 108  CO2 24 24 24 24   BUN 18 18 15 16   CREATININE 1.02 0.92 0.87 0.91  GLUCOSE 140* 160* 180* 138*  CALCIUM 8.6* 8.8* 8.6* 8.5*   Lab Results  Component Value Date   INR 1.1 04/16/2021   INR 1.0 07/08/2019   INR 1.14 07/12/2016     Recent Radiographic Studies :  DG  Pelvis Portable  Result Date: 04/24/2021 CLINICAL DATA:  Status post right hip total arthroplasty EXAM: PORTABLE PELVIS 1-2 VIEWS; DG HIP (WITH OR WITHOUT PELVIS) 1V PORT RIGHT COMPARISON:  None. FINDINGS: Status post right hip total arthroplasty with expected overlying postoperative change. No evidence of perihardware fracture or component loosening. Additionally status post remote left hip total arthroplasty. The bony pelvis is unremarkable in single frontal view. Nonobstructive pattern of overlying bowel gas. IMPRESSION: Status post right hip total arthroplasty with expected overlying postoperative change. No evidence of perihardware fracture or component loosening. Electronically Signed   By: Eddie Candle M.D.   On: 04/24/2021 13:35   ECHOCARDIOGRAM COMPLETE  Result Date: 04/29/2021    ECHOCARDIOGRAM REPORT   Patient Name:   Norman Hudson. Date of Exam: 04/29/2021 Medical Rec #:  [id number]                   Height:       66.0 in Accession #:    [id number]                  Weight:       210.5 lb Date of Birth:  Mar 04, 1951                   BSA:          2.044 m Patient Age:    70 years                    BP:           120/72 mmHg Patient Gender: M                           HR:           113 bpm. Exam Location:  Inpatient Procedure: 2D Echo, Cardiac Doppler and Color Doppler Indications:     Atrial flutter  History:         Patient has prior history of Echocardiogram examinations, most                  recent 02/27/2019. TIA, Arrythmias:Atrial Flutter,                  Signs/Symptoms:Anemia; Risk Factors:Hypertension and                  Dyslipidemia. Post op hip replacement.  Sonographer:     Dustin Flock RDCS Referring Phys:  [phone]  116/76 98.1 F (36.7 C) -- 97 18 100 %       Recent laboratory studies: Recent Labs    04/25/21 0307 04/27/21 0934 04/28/21 0315 04/28/21 1223 04/29/21 0847 04/30/21 0605  WBC 11.1* 8.7 7.0  --  6.3 7.8  HGB 8.6* 7.6* 9.9* 11.0* 11.1* 11.0*  HCT 25.1* 21.7* 27.6* 31.3* 32.1* 31.8*  PLT 134* 130* 125*  --  159 170   Recent Labs    04/25/21 0307 04/28/21 1223 04/29/21 0847 04/30/21 0605  NA 134* 137 139 137  K 3.8 4.1 4.2 4.0  CL 101 106 109 108  CO2 24 24 24 24   BUN 18 18 15 16   CREATININE 1.02 0.92 0.87 0.91  GLUCOSE 140* 160* 180* 138*  CALCIUM 8.6* 8.8* 8.6* 8.5*   Lab Results  Component Value Date   INR 1.1 04/16/2021   INR 1.0 07/08/2019   INR 1.14 07/12/2016     Recent Radiographic Studies :  DG  Pelvis Portable  Result Date: 04/24/2021 CLINICAL DATA:  Status post right hip total arthroplasty EXAM: PORTABLE PELVIS 1-2 VIEWS; DG HIP (WITH OR WITHOUT PELVIS) 1V PORT RIGHT COMPARISON:  None. FINDINGS: Status post right hip total arthroplasty with expected overlying postoperative change. No evidence of perihardware fracture or component loosening. Additionally status post remote left hip total arthroplasty. The bony pelvis is unremarkable in single frontal view. Nonobstructive pattern of overlying bowel gas. IMPRESSION: Status post right hip total arthroplasty with expected overlying postoperative change. No evidence of perihardware fracture or component loosening. Electronically Signed   By: Eddie Candle M.D.   On: 04/24/2021 13:35   ECHOCARDIOGRAM COMPLETE  Result Date: 04/29/2021    ECHOCARDIOGRAM REPORT   Patient Name:   Norman Hudson. Date of Exam: 04/29/2021 Medical Rec #:  [id number]                   Height:       66.0 in Accession #:    [id number]                  Weight:       210.5 lb Date of Birth:  Mar 04, 1951                   BSA:          2.044 m Patient Age:    70 years                    BP:           120/72 mmHg Patient Gender: M                           HR:           113 bpm. Exam Location:  Inpatient Procedure: 2D Echo, Cardiac Doppler and Color Doppler Indications:     Atrial flutter  History:         Patient has prior history of Echocardiogram examinations, most                  recent 02/27/2019. TIA, Arrythmias:Atrial Flutter,                  Signs/Symptoms:Anemia; Risk Factors:Hypertension and                  Dyslipidemia. Post op hip replacement.  Sonographer:     Dustin Flock RDCS Referring Phys:  [phone]  116/76 98.1 F (36.7 C) -- 97 18 100 %       Recent laboratory studies: Recent Labs    04/25/21 0307 04/27/21 0934 04/28/21 0315 04/28/21 1223 04/29/21 0847 04/30/21 0605  WBC 11.1* 8.7 7.0  --  6.3 7.8  HGB 8.6* 7.6* 9.9* 11.0* 11.1* 11.0*  HCT 25.1* 21.7* 27.6* 31.3* 32.1* 31.8*  PLT 134* 130* 125*  --  159 170   Recent Labs    04/25/21 0307 04/28/21 1223 04/29/21 0847 04/30/21 0605  NA 134* 137 139 137  K 3.8 4.1 4.2 4.0  CL 101 106 109 108  CO2 24 24 24 24   BUN 18 18 15 16   CREATININE 1.02 0.92 0.87 0.91  GLUCOSE 140* 160* 180* 138*  CALCIUM 8.6* 8.8* 8.6* 8.5*   Lab Results  Component Value Date   INR 1.1 04/16/2021   INR 1.0 07/08/2019   INR 1.14 07/12/2016     Recent Radiographic Studies :  DG  Pelvis Portable  Result Date: 04/24/2021 CLINICAL DATA:  Status post right hip total arthroplasty EXAM: PORTABLE PELVIS 1-2 VIEWS; DG HIP (WITH OR WITHOUT PELVIS) 1V PORT RIGHT COMPARISON:  None. FINDINGS: Status post right hip total arthroplasty with expected overlying postoperative change. No evidence of perihardware fracture or component loosening. Additionally status post remote left hip total arthroplasty. The bony pelvis is unremarkable in single frontal view. Nonobstructive pattern of overlying bowel gas. IMPRESSION: Status post right hip total arthroplasty with expected overlying postoperative change. No evidence of perihardware fracture or component loosening. Electronically Signed   By: Eddie Candle M.D.   On: 04/24/2021 13:35   ECHOCARDIOGRAM COMPLETE  Result Date: 04/29/2021    ECHOCARDIOGRAM REPORT   Patient Name:   Norman Hudson. Date of Exam: 04/29/2021 Medical Rec #:  [id number]                   Height:       66.0 in Accession #:    [id number]                  Weight:       210.5 lb Date of Birth:  Mar 04, 1951                   BSA:          2.044 m Patient Age:    70 years                    BP:           120/72 mmHg Patient Gender: M                           HR:           113 bpm. Exam Location:  Inpatient Procedure: 2D Echo, Cardiac Doppler and Color Doppler Indications:     Atrial flutter  History:         Patient has prior history of Echocardiogram examinations, most                  recent 02/27/2019. TIA, Arrythmias:Atrial Flutter,                  Signs/Symptoms:Anemia; Risk Factors:Hypertension and                  Dyslipidemia. Post op hip replacement.  Sonographer:     Dustin Flock RDCS Referring Phys:  [phone]

## 2021-04-30 NOTE — Plan of Care (Signed)
  Problem: Education: Goal: Knowledge of the prescribed therapeutic regimen will improve Outcome: Progressing Goal: Understanding of discharge needs will improve Outcome: Progressing   Problem: Pain Management: Goal: Pain level will decrease with appropriate interventions Outcome: Progressing   

## 2021-04-30 NOTE — Progress Notes (Signed)
Occupational Therapy Treatment Patient Details Name: Norman Hudson. 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   OT comments  Patient's spouse present for OT session, all questions addressed regarding lower body dressing especially pertaining to donning TED hoes. Overall patient min G assist for functional ambulation with walker and transfer on/off toilet needing verbal cues for body mechanics. Patient reports may be able to go home today "need to talk with the doctors first."    Recommendations for follow up therapy are one component of a multi-disciplinary discharge planning process, led by the attending physician.  Recommendations may be updated based on patient status, additional functional criteria and insurance authorization.    Follow Up Recommendations  Home health OT    Equipment Recommendations  None recommended by OT       Precautions / Restrictions Precautions Precautions: Posterior Hip;Fall Precaution Booklet Issued: Yes (comment) Precaution Comments: Pt recalls all THP Restrictions RLE Weight Bearing: Weight bearing as tolerated       Mobility Bed Mobility Overal bed mobility: Modified Independent Bed Mobility: Supine to Sit     Supine to sit: Modified independent (Device/Increase time);HOB elevated     General bed mobility comments: use of belt strap to lift LE out of bed, with increased time able to upright trunk to sitting    Transfers Overall transfer level: Needs assistance Equipment used: Rolling walker (2 wheeled) Transfers: Sit to/from Stand Sit to Stand: Min guard;From elevated surface         General transfer comment: verbal cues for body mechanics    Balance Overall balance assessment: Needs assistance Sitting-balance support: Feet supported Sitting balance-Leahy Scale: Good     Standing  balance support: Single extremity supported;Bilateral upper extremity supported Standing balance-Leahy Scale: Poor Standing balance comment: reliant on at least unilateral UE support                           ADL either performed or assessed with clinical judgement   ADL Overall ADL's : Needs assistance/impaired     Grooming: Wash/dry hands;Supervision/safety;Standing       Lower Body Bathing: Total assistance;Sit to/from stand Lower Body Bathing Details (indicate cue type and reason): patient needing assist to wash lower back and buttock in standing, reliant on UE support       Lower Body Dressing Details (indicate cue type and reason): spouse plans to assist patient with dressing at home. educated patient/spouse on sequencing to don R side first and doff R side last. Also had questions regarding donning TED hoes, educate patient on compensatory strategies as well as option of purchasing sock aid for TED hoes. showed spouse picture online/how to purchase if interested Toilet Transfer: Min guard;Ambulation;RW;Cueing for sequencing;Cueing for safety (commode over toilet) Toilet Transfer Details (indicate cue type and reason): cues for body mechanics, min G for safety   Toileting - Clothing Manipulation Details (indicate cue type and reason): did not have success with bowel movement     Functional mobility during ADLs: Min guard;Rolling walker;Cueing for sequencing;Cueing for safety        Cognition Arousal/Alertness: Awake/alert Behavior During Therapy: WFL for tasks assessed/performed Overall Cognitive Status: Within Functional Limits for tasks assessed  Pertinent Vitals/ Pain       Pain Assessment: Faces Faces Pain Scale: Hurts a little bit Pain Location: R hip Pain Descriptors / Indicators: Grimacing;Operative site guarding Pain Intervention(s): Monitored during session          Frequency  Min 2X/week        Progress Toward Goals  OT Goals(current goals can now be found in the care plan section)  Progress towards OT goals: Progressing toward goals  Acute Rehab OT Goals Patient Stated Goal: Regain IND OT Goal Formulation: With patient Time For Goal Achievement: 05/09/21 Potential to Achieve Goals: Good ADL Goals Pt Will Perform Lower Body Dressing: with modified independence;sit to/from stand;with adaptive equipment Pt Will Transfer to Toilet: with modified independence;bedside commode;stand pivot transfer Pt Will Perform Toileting - Clothing Manipulation and hygiene: with modified independence;sit to/from stand Additional ADL Goal #1: Patient will stand at sink to perform grooming task as evidence of improving activity tolerance  Plan Discharge plan remains appropriate       AM-PAC OT "6 Clicks" Daily Activity     Outcome Measure   Help from another person eating meals?: None Help from another person taking care of personal grooming?: A Little Help from another person toileting, which includes using toliet, bedpan, or urinal?: A Little Help from another person bathing (including washing, rinsing, drying)?: A Lot Help from another person to put on and taking off regular upper body clothing?: A Little Help from another person to put on and taking off regular lower body clothing?: A Lot 6 Click Score: 17    End of Session Equipment Utilized During Treatment: Rolling walker  OT Visit Diagnosis: Unsteadiness on feet (R26.81);Muscle weakness (generalized) (M62.81);Pain Pain - Right/Left: Right Pain - part of body: Leg   Activity Tolerance Patient tolerated treatment well   Patient Left in chair;with call bell/phone within reach;with family/visitor present   Nurse Communication Mobility status        Time: 1324-4010 OT Time Calculation (min): 30 min  Charges: OT General Charges $OT Visit: 1 Visit OT Treatments $Self Care/Home Management  : 23-37 mins  Marlyce Huge OT OT pager: [phone]   Carmelia Roller 04/30/2021, 12:14 PM

## 2021-05-01 DIAGNOSIS — G47419 Narcolepsy without cataplexy: Secondary | ICD-10-CM | POA: Diagnosis not present

## 2021-05-01 DIAGNOSIS — R69 Illness, unspecified: Secondary | ICD-10-CM | POA: Diagnosis not present

## 2021-05-01 DIAGNOSIS — L719 Rosacea, unspecified: Secondary | ICD-10-CM | POA: Diagnosis not present

## 2021-05-01 DIAGNOSIS — K4090 Unilateral inguinal hernia, without obstruction or gangrene, not specified as recurrent: Secondary | ICD-10-CM | POA: Diagnosis not present

## 2021-05-01 DIAGNOSIS — Z96643 Presence of artificial hip joint, bilateral: Secondary | ICD-10-CM | POA: Diagnosis not present

## 2021-05-01 DIAGNOSIS — G4752 REM sleep behavior disorder: Secondary | ICD-10-CM | POA: Diagnosis not present

## 2021-05-01 DIAGNOSIS — Z8673 Personal history of transient ischemic attack (TIA), and cerebral infarction without residual deficits: Secondary | ICD-10-CM | POA: Diagnosis not present

## 2021-05-01 DIAGNOSIS — G4733 Obstructive sleep apnea (adult) (pediatric): Secondary | ICD-10-CM | POA: Diagnosis not present

## 2021-05-01 DIAGNOSIS — Z96641 Presence of right artificial hip joint: Secondary | ICD-10-CM | POA: Diagnosis not present

## 2021-05-01 DIAGNOSIS — I4892 Unspecified atrial flutter: Secondary | ICD-10-CM | POA: Diagnosis not present

## 2021-05-01 DIAGNOSIS — Z87891 Personal history of nicotine dependence: Secondary | ICD-10-CM | POA: Diagnosis not present

## 2021-05-01 DIAGNOSIS — E785 Hyperlipidemia, unspecified: Secondary | ICD-10-CM | POA: Diagnosis not present

## 2021-05-01 DIAGNOSIS — F419 Anxiety disorder, unspecified: Secondary | ICD-10-CM | POA: Diagnosis not present

## 2021-05-01 DIAGNOSIS — Z471 Aftercare following joint replacement surgery: Secondary | ICD-10-CM | POA: Diagnosis not present

## 2021-05-01 DIAGNOSIS — Z7901 Long term (current) use of anticoagulants: Secondary | ICD-10-CM | POA: Diagnosis not present

## 2021-05-01 DIAGNOSIS — E119 Type 2 diabetes mellitus without complications: Secondary | ICD-10-CM | POA: Diagnosis not present

## 2021-05-01 DIAGNOSIS — E039 Hypothyroidism, unspecified: Secondary | ICD-10-CM | POA: Diagnosis not present

## 2021-05-01 DIAGNOSIS — Z7984 Long term (current) use of oral hypoglycemic drugs: Secondary | ICD-10-CM | POA: Diagnosis not present

## 2021-05-01 DIAGNOSIS — I10 Essential (primary) hypertension: Secondary | ICD-10-CM | POA: Diagnosis not present

## 2021-05-01 NOTE — Progress Notes (Signed)
   04/25/21 1012  Assess: MEWS Score  Temp 98.3 F (36.8 C)  BP (!) 78/53  Pulse Rate 75  Resp 18  SpO2 96 %  O2 Device Room Air  Assess: MEWS Score  MEWS Temp 0  MEWS Systolic 2  MEWS Pulse 0  MEWS RR 0  MEWS LOC 0  MEWS Score 2  MEWS Score Color Yellow  Assess: if the MEWS score is Yellow or Red  Were vital signs taken at a resting state? Yes  Focused Assessment No change from prior assessment  Does the patient meet 2 or more of the SIRS criteria? No  MEWS guidelines implemented *See Row Information* No, vital signs rechecked  Treat  MEWS Interventions Other (Comment) (spoke to PA about ordering fluids/bolus)  Pain Scale 0-10  Pain Score 0  Notify: Charge Nurse/RN  Name of Charge Nurse/RN Notified Jacob Moores RN  Date Charge Nurse/RN Notified 04/25/21  Time Charge Nurse/RN Notified 1012  Notify: Provider  Provider Name/Title PA  Date Provider Notified 04/25/21  Time Provider Notified 1012  Notification Type Face-to-face  Assess: SIRS CRITERIA  SIRS Temperature  0  SIRS Pulse 0  SIRS Respirations  0  SIRS WBC 0  SIRS Score Sum  0

## 2021-05-03 LAB — CULTURE, BLOOD (ROUTINE X 2)
Culture: NO GROWTH
Culture: NO GROWTH
Special Requests: ADEQUATE
Special Requests: ADEQUATE

## 2021-05-04 LAB — SARS CORONAVIRUS 2 (TAT 6-24 HRS): SARS Coronavirus 2: NEGATIVE

## 2021-05-05 DIAGNOSIS — F419 Anxiety disorder, unspecified: Secondary | ICD-10-CM | POA: Diagnosis not present

## 2021-05-05 DIAGNOSIS — Z7984 Long term (current) use of oral hypoglycemic drugs: Secondary | ICD-10-CM | POA: Diagnosis not present

## 2021-05-05 DIAGNOSIS — E119 Type 2 diabetes mellitus without complications: Secondary | ICD-10-CM | POA: Diagnosis not present

## 2021-05-05 DIAGNOSIS — Z8673 Personal history of transient ischemic attack (TIA), and cerebral infarction without residual deficits: Secondary | ICD-10-CM | POA: Diagnosis not present

## 2021-05-05 DIAGNOSIS — R69 Illness, unspecified: Secondary | ICD-10-CM | POA: Diagnosis not present

## 2021-05-05 DIAGNOSIS — Z471 Aftercare following joint replacement surgery: Secondary | ICD-10-CM | POA: Diagnosis not present

## 2021-05-05 DIAGNOSIS — G4733 Obstructive sleep apnea (adult) (pediatric): Secondary | ICD-10-CM | POA: Diagnosis not present

## 2021-05-05 DIAGNOSIS — I4892 Unspecified atrial flutter: Secondary | ICD-10-CM | POA: Diagnosis not present

## 2021-05-05 DIAGNOSIS — E039 Hypothyroidism, unspecified: Secondary | ICD-10-CM | POA: Diagnosis not present

## 2021-05-05 DIAGNOSIS — E785 Hyperlipidemia, unspecified: Secondary | ICD-10-CM | POA: Diagnosis not present

## 2021-05-05 DIAGNOSIS — I10 Essential (primary) hypertension: Secondary | ICD-10-CM | POA: Diagnosis not present

## 2021-05-05 DIAGNOSIS — Z87891 Personal history of nicotine dependence: Secondary | ICD-10-CM | POA: Diagnosis not present

## 2021-05-05 DIAGNOSIS — Z7901 Long term (current) use of anticoagulants: Secondary | ICD-10-CM | POA: Diagnosis not present

## 2021-05-05 DIAGNOSIS — K4090 Unilateral inguinal hernia, without obstruction or gangrene, not specified as recurrent: Secondary | ICD-10-CM | POA: Diagnosis not present

## 2021-05-05 DIAGNOSIS — G4752 REM sleep behavior disorder: Secondary | ICD-10-CM | POA: Diagnosis not present

## 2021-05-05 DIAGNOSIS — G47419 Narcolepsy without cataplexy: Secondary | ICD-10-CM | POA: Diagnosis not present

## 2021-05-05 DIAGNOSIS — Z96643 Presence of artificial hip joint, bilateral: Secondary | ICD-10-CM | POA: Diagnosis not present

## 2021-05-05 DIAGNOSIS — L719 Rosacea, unspecified: Secondary | ICD-10-CM | POA: Diagnosis not present

## 2021-05-07 DIAGNOSIS — Z87891 Personal history of nicotine dependence: Secondary | ICD-10-CM | POA: Diagnosis not present

## 2021-05-07 DIAGNOSIS — I4892 Unspecified atrial flutter: Secondary | ICD-10-CM | POA: Diagnosis not present

## 2021-05-07 DIAGNOSIS — L719 Rosacea, unspecified: Secondary | ICD-10-CM | POA: Diagnosis not present

## 2021-05-07 DIAGNOSIS — G4733 Obstructive sleep apnea (adult) (pediatric): Secondary | ICD-10-CM | POA: Diagnosis not present

## 2021-05-07 DIAGNOSIS — G4752 REM sleep behavior disorder: Secondary | ICD-10-CM | POA: Diagnosis not present

## 2021-05-07 DIAGNOSIS — K4090 Unilateral inguinal hernia, without obstruction or gangrene, not specified as recurrent: Secondary | ICD-10-CM | POA: Diagnosis not present

## 2021-05-07 DIAGNOSIS — M1611 Unilateral primary osteoarthritis, right hip: Secondary | ICD-10-CM | POA: Diagnosis not present

## 2021-05-07 DIAGNOSIS — Z8673 Personal history of transient ischemic attack (TIA), and cerebral infarction without residual deficits: Secondary | ICD-10-CM | POA: Diagnosis not present

## 2021-05-07 DIAGNOSIS — G47419 Narcolepsy without cataplexy: Secondary | ICD-10-CM | POA: Diagnosis not present

## 2021-05-07 DIAGNOSIS — Z471 Aftercare following joint replacement surgery: Secondary | ICD-10-CM | POA: Diagnosis not present

## 2021-05-07 DIAGNOSIS — E785 Hyperlipidemia, unspecified: Secondary | ICD-10-CM | POA: Diagnosis not present

## 2021-05-07 DIAGNOSIS — R69 Illness, unspecified: Secondary | ICD-10-CM | POA: Diagnosis not present

## 2021-05-07 DIAGNOSIS — E119 Type 2 diabetes mellitus without complications: Secondary | ICD-10-CM | POA: Diagnosis not present

## 2021-05-07 DIAGNOSIS — Z96643 Presence of artificial hip joint, bilateral: Secondary | ICD-10-CM | POA: Diagnosis not present

## 2021-05-07 DIAGNOSIS — E039 Hypothyroidism, unspecified: Secondary | ICD-10-CM | POA: Diagnosis not present

## 2021-05-07 DIAGNOSIS — I10 Essential (primary) hypertension: Secondary | ICD-10-CM | POA: Diagnosis not present

## 2021-05-07 DIAGNOSIS — Z7901 Long term (current) use of anticoagulants: Secondary | ICD-10-CM | POA: Diagnosis not present

## 2021-05-07 DIAGNOSIS — F419 Anxiety disorder, unspecified: Secondary | ICD-10-CM | POA: Diagnosis not present

## 2021-05-07 DIAGNOSIS — Z7984 Long term (current) use of oral hypoglycemic drugs: Secondary | ICD-10-CM | POA: Diagnosis not present

## 2021-05-11 IMAGING — MR MR HEAD W/O CM
9 of 11 series · 33 of 48 positions shown · non-contrast
Comparison: None.

CLINICAL DATA: Altered level of consciousness with stroke
suspected. Episode of expressive aphasia lasting 3-5 minutes.

EXAM:
MRI HEAD WITHOUT CONTRAST
TECHNIQUE: Multiplanar, multiecho pulse sequences of the brain and surrounding
structures were obtained without intravenous contrast.

[Series 2: DWI · axial · 3.0mm · 0.94mm/px · z∈[-92,+61]mm · 7 of 104 slices shown (1 of 2)]
[im 1/104]
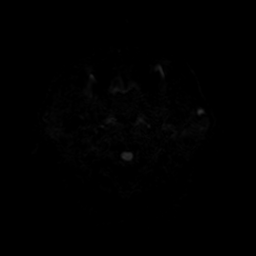
[im 18/104]
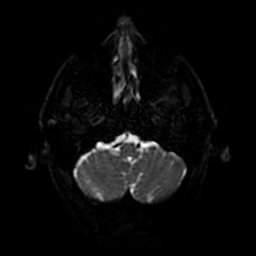
[im 35/104]
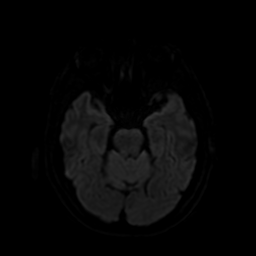
[im 52/104]
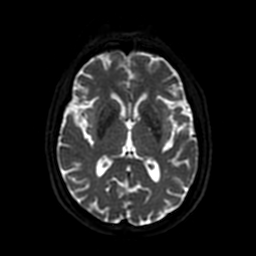
[im 69/104]
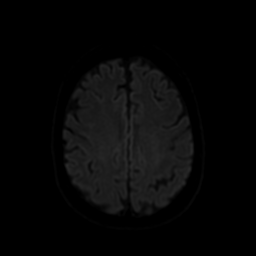
[im 86/104]
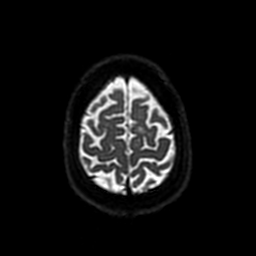
[im 104/104]
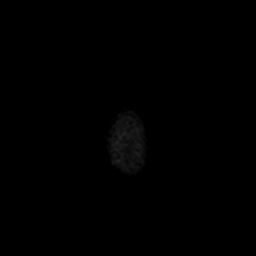

[Series 3: DWI · coronal · 4.0mm · 0.94mm/px · 6 of 72 slices shown (2 of 2)]
[im 1/72]
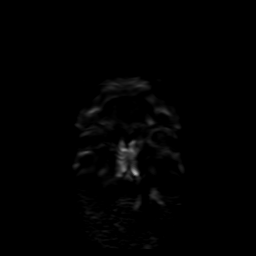
[im 15/72]
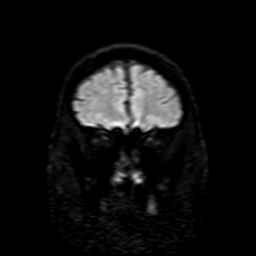
[im 29/72]
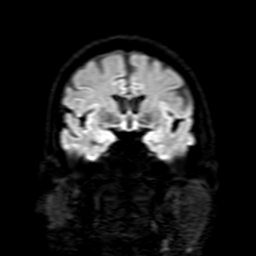
[im 43/72]
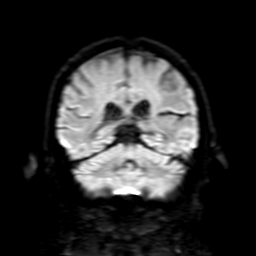
[im 57/72]
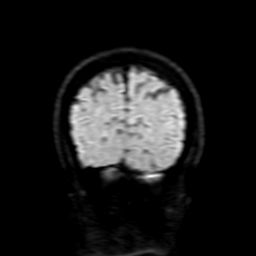
[im 72/72]
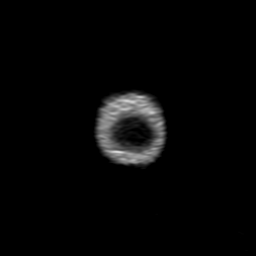

[Series 4: FLAIR · sagittal · 5.0mm · 0.47mm/px · 2 of 25 slices shown (1 of 2)]
[im 1/25]
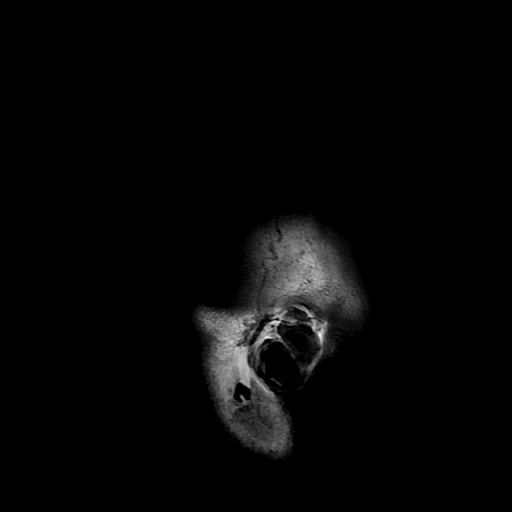
[im 25/25]
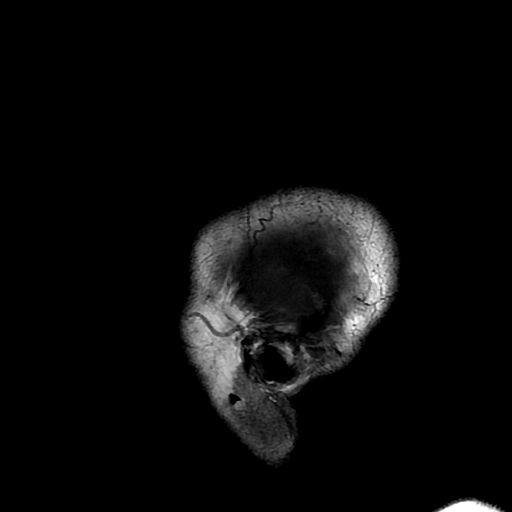

[Series 5: FLAIR · axial · 3.0mm · 0.45mm/px · z∈[-90,+60]mm · 2 of 26 slices shown (2 of 2)]
[im 1/26]
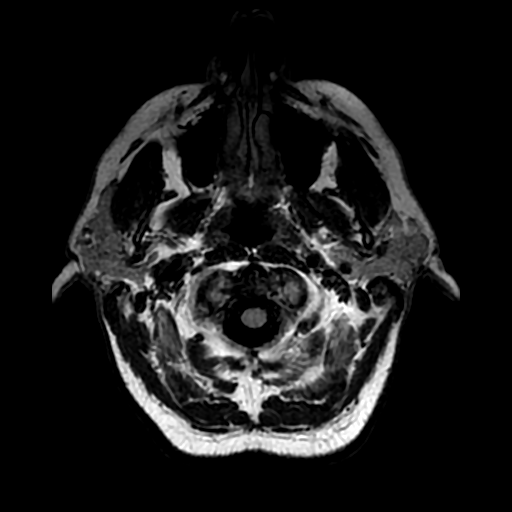
[im 26/26]
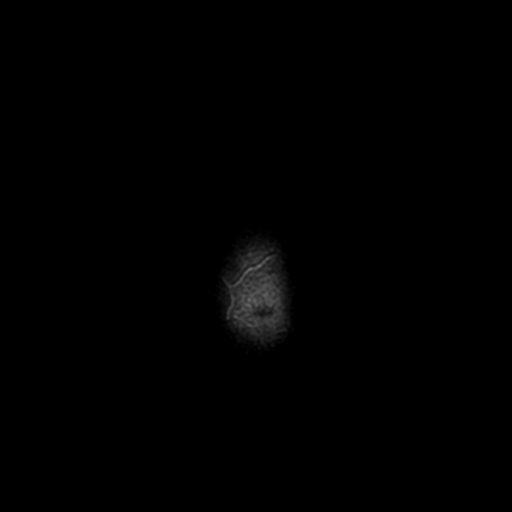

[Series 6: SWI · axial · 3.0mm · 0.47mm/px · z∈[-92,-5]mm · 5 of 104 slices shown]
[im 1/104]
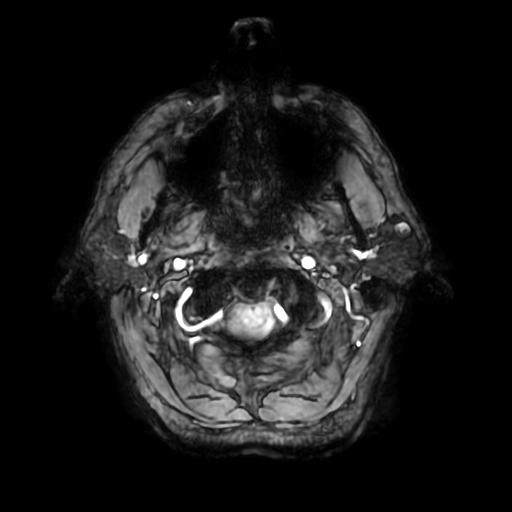
[im 15/104]
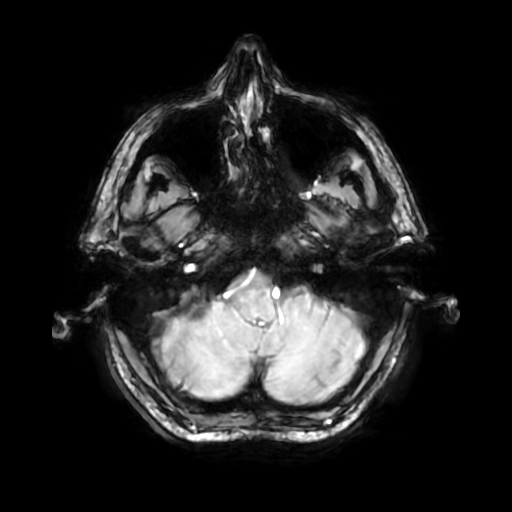
[im 30/104]
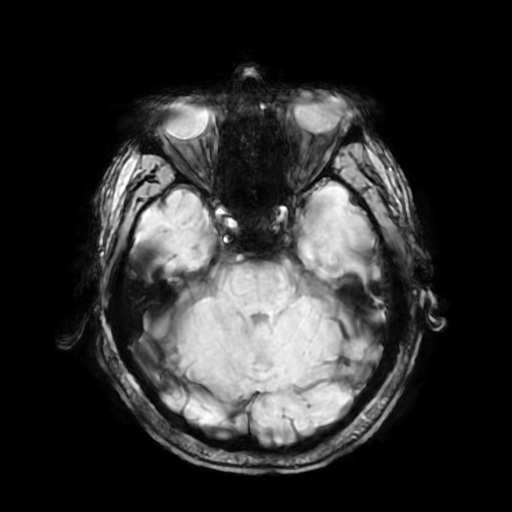
[im 45/104]
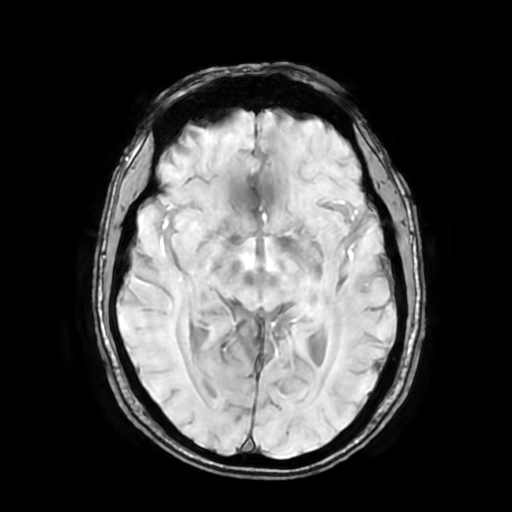
[im 59/104]
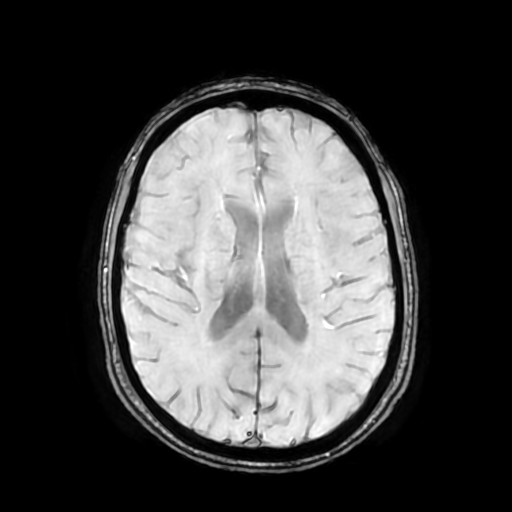

[Series 7: T2 · axial · 5.0mm · 0.47mm/px · z∈[-90,+60]mm · 2 of 26 slices shown]
[im 1/26]
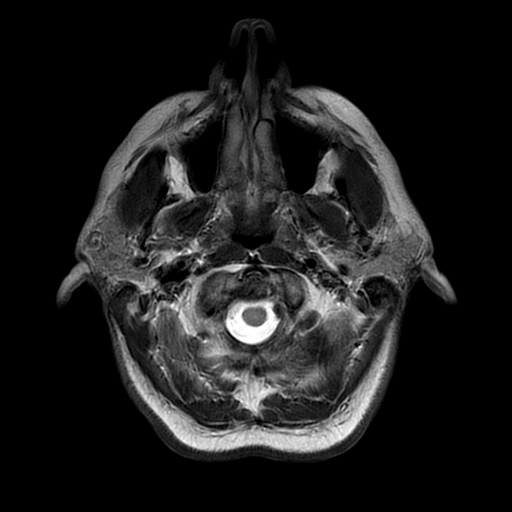
[im 26/26]
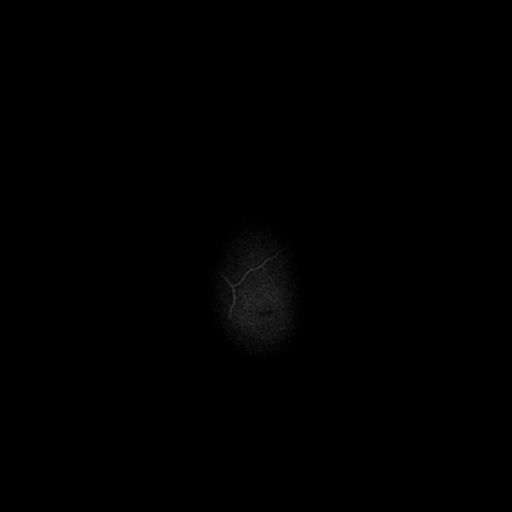

[Series 9: T2 post-contrast · coronal · 5.0mm · 0.39mm/px · 2 of 30 slices shown]
[im 1/30]
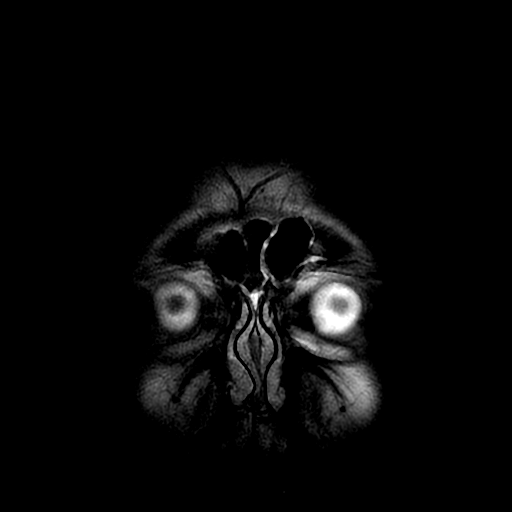
[im 30/30]
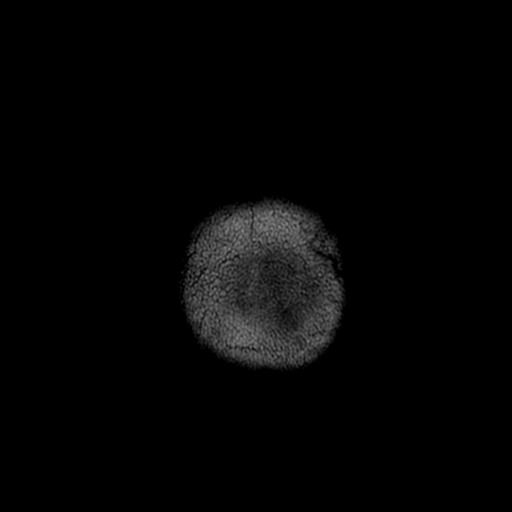

[Series 250: ADC · axial · 3.0mm · 0.94mm/px · z∈[-92,+61]mm · 4 of 48 slices shown (1 of 2)]
[im 1/48]
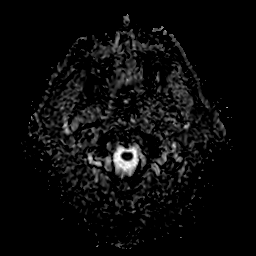
[im 16/48]
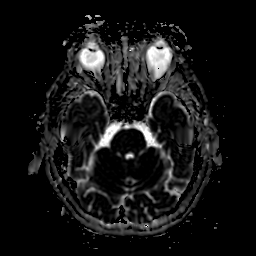
[im 32/48]
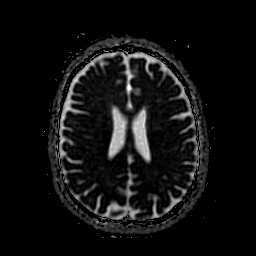
[im 48/48]
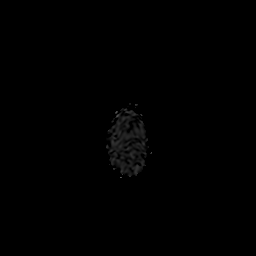

[Series 350: ADC · coronal · 4.0mm · 0.94mm/px · 3 of 36 slices shown (2 of 2)]
[im 1/36]
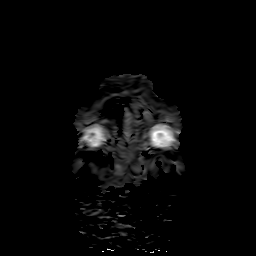
[im 18/36]
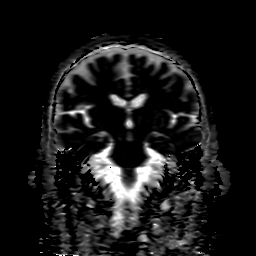
[im 36/36]
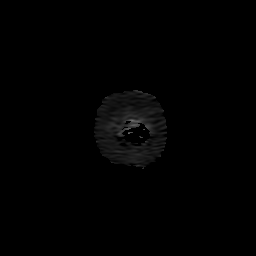

[33 of 48 positions shown; findings below may reference images not displayed]

FINDINGS: Brain: No acute infarction, hemorrhage, hydrocephalus, extra-axial
collection or mass lesion. Age normal brain volume. Few remote
cerebral white matter insults which correlate with vascular risk
factors in the medical history.

Vascular: Normal flow voids

Skull and upper cervical spine: Normal marrow signal

Sinuses/Orbits: Mild mucosal thickening in the ethmoid sinuses. T2
hyperintensity in the left nasal cavity has a polypoid appearance on
axial T2 weighted imaging.
IMPRESSION: 1. No emergent finding including infarct.
2. Mild chronic small vessel ischemic type change in the cerebral
white matter.
3. Mild right frontal ethmoid sinusitis. Probable small left nasal
cavity polyp.

## 2021-05-14 DIAGNOSIS — I4891 Unspecified atrial fibrillation: Secondary | ICD-10-CM | POA: Diagnosis not present

## 2021-05-14 DIAGNOSIS — L309 Dermatitis, unspecified: Secondary | ICD-10-CM | POA: Diagnosis not present

## 2021-05-14 DIAGNOSIS — Z23 Encounter for immunization: Secondary | ICD-10-CM | POA: Diagnosis not present

## 2021-05-21 ENCOUNTER — Telehealth: Payer: Self-pay

## 2021-05-21 DIAGNOSIS — I10 Essential (primary) hypertension: Secondary | ICD-10-CM | POA: Diagnosis not present

## 2021-05-21 DIAGNOSIS — I491 Atrial premature depolarization: Secondary | ICD-10-CM | POA: Diagnosis not present

## 2021-05-21 NOTE — Telephone Encounter (Signed)
Patient called and stated that you were overseeing him in the hospital. He stated that you started him on [place] 25MG   1/2 tab (12.5mg ) daily  and HCTZ 12.5 mg once daily and his BP has been trending up starting at 135/80 to now as high as 152/91  HR51. Patient stated that he has been holding his Spironolactone 25mg  1x daily for now,  as you advised him. Wants to know if he should be concerned about BP steadily going up.

## 2021-05-21 NOTE — Telephone Encounter (Signed)
His blood pressure has improved since is converted from Afib (hospital) to NSR (EKG as sent to [place] last week).  He can restart spironolactone 25mg  po qday.  Order BMP in one week to check his renal function.   ST

## 2021-05-22 ENCOUNTER — Telehealth: Payer: Self-pay

## 2021-05-22 MED ORDER — SPIRONOLACTONE 25 MG PO TABS: 25.0000 mg | ORAL_TABLET | Freq: Every day | ORAL | 3 refills | Status: DC

## 2021-05-22 NOTE — Telephone Encounter (Addendum)
ON-CALL CARDIOLOGY 05/21/2021  Patient's name: Norman Hudson..   MRN: [id number].    DOB: 1951/02/03 Primary care provider: Glenis Smoker, MD. Primary cardiologist: Dr. Serafina Royals regarding this patient's care today: Patient called cardiology on-call service with concerns that his blood pressure has been trending up over the last couple of weeks.  Patient called the office earlier today and Dr. Terri Skains had advised restarting spironolactone 25 mg p.o. daily.  Unfortunately patient was not contacted prior to the end of the day regarding these recommendations.  Impression:   ICD-10-CM   1. Essential hypertension  T70 Basic metabolic panel      Meds ordered this encounter  Medications   spironolactone (ALDACTONE) 25 MG tablet    Sig: Take 1 tablet (25 mg total) by mouth daily.    Dispense:  90 tablet    Refill:  3    Orders Placed This Encounter  Procedures   Basic metabolic panel    Recommendations: Advised patient to restart spironolactone 25 mg p.o. daily Repeat BMP in 1 week  Telephone encounter total time: 8 minutes     Alethia Berthold, PA-C 05/22/2021, 9:02 AM Office: [phone]

## 2021-05-26 DIAGNOSIS — G4733 Obstructive sleep apnea (adult) (pediatric): Secondary | ICD-10-CM | POA: Diagnosis not present

## 2021-05-26 NOTE — Progress Notes (Signed)
External Labs: Collected: 05/14/2021 provided by PCP Hemoglobin 10.6 g/dL, hematocrit 31.0%. Sodium 140, potassium 4.4, chloride 105, bicarb 27, BUN 14, creatinine 0.87 AST 18, ALT 22, alkaline phosphatase 67

## 2021-05-26 NOTE — Telephone Encounter (Signed)
If the pressures are well controlled now keep the 27th appt. Otherwise please move up the appt.

## 2021-05-27 NOTE — Telephone Encounter (Signed)
No answer left a vm I am aware patient also spoke to our PA

## 2021-05-29 DIAGNOSIS — I10 Essential (primary) hypertension: Secondary | ICD-10-CM | POA: Diagnosis not present

## 2021-05-30 LAB — BASIC METABOLIC PANEL
BUN/Creatinine Ratio: 12 (ref 10–24)
BUN: 10 mg/dL (ref 8–27)
CO2: 25 mmol/L (ref 20–29)
Calcium: 9.6 mg/dL (ref 8.6–10.2)
Chloride: 103 mmol/L (ref 96–106)
Creatinine, Ser: 0.84 mg/dL (ref 0.76–1.27)
Glucose: 134 mg/dL — ABNORMAL HIGH (ref 70–99)
Potassium: 4.5 mmol/L (ref 3.5–5.2)
Sodium: 144 mmol/L (ref 134–144)
eGFR: 93 mL/min/{1.73_m2} (ref >59)

## 2021-06-04 ENCOUNTER — Other Ambulatory Visit: Payer: Self-pay

## 2021-06-04 ENCOUNTER — Ambulatory Visit: Payer: Medicare HMO | Admitting: Cardiology

## 2021-06-04 ENCOUNTER — Encounter: Payer: Self-pay | Admitting: Cardiology

## 2021-06-04 VITALS — BP 175/89 | HR 72 | Resp 16 | Ht 66.0 in | Wt 199.6 lb

## 2021-06-04 DIAGNOSIS — G4733 Obstructive sleep apnea (adult) (pediatric): Secondary | ICD-10-CM

## 2021-06-04 DIAGNOSIS — Z9989 Dependence on other enabling machines and devices: Secondary | ICD-10-CM | POA: Diagnosis not present

## 2021-06-04 DIAGNOSIS — I10 Essential (primary) hypertension: Secondary | ICD-10-CM

## 2021-06-04 DIAGNOSIS — Z7901 Long term (current) use of anticoagulants: Secondary | ICD-10-CM | POA: Diagnosis not present

## 2021-06-04 DIAGNOSIS — I4892 Unspecified atrial flutter: Secondary | ICD-10-CM

## 2021-06-04 DIAGNOSIS — E7800 Pure hypercholesterolemia, unspecified: Secondary | ICD-10-CM | POA: Diagnosis not present

## 2021-06-04 DIAGNOSIS — G459 Transient cerebral ischemic attack, unspecified: Secondary | ICD-10-CM

## 2021-06-04 DIAGNOSIS — Z87891 Personal history of nicotine dependence: Secondary | ICD-10-CM | POA: Diagnosis not present

## 2021-06-04 MED ORDER — HYDROCHLOROTHIAZIDE 25 MG PO TABS: 25.0000 mg | ORAL_TABLET | Freq: Every morning | ORAL | 0 refills | Status: DC

## 2021-06-04 MED ORDER — METOPROLOL SUCCINATE ER 25 MG PO TB24: 25.0000 mg | ORAL_TABLET | ORAL | 0 refills | Status: DC

## 2021-06-04 MED ORDER — OLMESARTAN MEDOXOMIL 40 MG PO TABS: 40.0000 mg | ORAL_TABLET | Freq: Every day | ORAL | 0 refills | Status: DC

## 2021-06-04 NOTE — Progress Notes (Signed)
Norman Hudson. Date of Birth: July 17, 1951 MRN: [id number] Primary Care Provider:Timberlake, Meridee Score, MD Former Cardiology Providers: Altamese Riverton, APRN, FNP-C Primary Cardiologist: Tessa Lerner, DO, [facility] (established care 02/21/2020)  Date: 06/04/21 Last Office Visit: 03/20/2021  Chief Complaint  Patient presents with   Follow-up    Postop atrial flutter   Atrial Flutter    HPI  Norman Hudson. is a 70 y.o.  male who presents to the office with a chief complaint of " follow-up after recent diagnosis of postoperative atrial flutter." Patient's past medical history and cardiovascular risk factors include: Paroxysmal atrial flutter (04/2021), prediabetes, hypertension, hyperlipidemia, chronically low testosterone, hypothyroidism, former smoker with 15 pack year history, anxiety, osteoarthritis, history of TIA, advanced age.  He is being followed by the practice longitudinally for the management of hypertension, PACs, and paroxysmal atrial flutter.  Patient is accompanied by his wife at today's office visit.  At last office visit patient requested preoperative risk stratification for hip replacement surgery.  Postoperatively cardiology was consulted for postop atrial flutter most likely secondary to fluid shifts, acute blood loss anemia.  Patient was started on AV nodal blocking agents and once cleared by surgery oral anticoagulation given his CHA2DS2-VASc score.  He now presents for follow-up.  Patient is doing well from a cardiovascular standpoint.  But his home blood pressures are not at baseline.  Patient states that his average blood pressures are 155/86 mmHg with a pulse of 57 bpm.  Of note, his antihypertensive medications were down titrated due to hypotension and orthostasis during his recent hospitalization.  FUNCTIONAL STATUS: No structured exercise program or daily routine.    ALLERGIES: Allergies  Allergen Reactions   Bactrim  [Sulfamethoxazole-Trimethoprim]     "threw me for a loop, hives, itching, extreme fatigue"    MEDICATION LIST PRIOR TO VISIT: Current Outpatient Medications on File Prior to Visit  Medication Sig Dispense Refill   acetaminophen (TYLENOL) 325 MG tablet Take 2 tablets (650 mg total) by mouth every 4 (four) hours as needed. 100 tablet 2   ALPRAZolam (XANAX) 0.5 MG tablet Take 0.5 mg by mouth at bedtime as needed for anxiety.     apixaban (ELIQUIS) 5 MG TABS tablet Take 1 tablet (5 mg total) by mouth 2 (two) times daily. 60 tablet 0   cholecalciferol (VITAMIN D3) 25 MCG (1000 UT) tablet Take 1,000 Units by mouth daily.     levothyroxine (SYNTHROID, LEVOTHROID) 100 MCG tablet Take 100 mcg by mouth daily before breakfast.     metFORMIN (GLUCOPHAGE) 500 MG tablet Take 500 mg by mouth daily with breakfast.     Multiple Vitamins-Minerals (MULTIVITAMIN WITH MINERALS) tablet Take 1 tablet by mouth daily.     pyridOXINE (VITAMIN B-6) 100 MG tablet Take 100 mg by mouth daily.     rosuvastatin (CRESTOR) 20 MG tablet TAKE ONE TABLET AT BEDTIME. (Patient taking differently: Take 20 mg by mouth at bedtime.) 90 tablet 2   spironolactone (ALDACTONE) 25 MG tablet Take 1 tablet (25 mg total) by mouth daily. 90 tablet 3   tiZANidine (ZANAFLEX) 4 MG tablet Take 1 tablet (4 mg total) by mouth every 8 (eight) hours as needed for muscle spasms. 30 tablet 1   valACYclovir (VALTREX) 1000 MG tablet Take 1,000 mg by mouth daily as needed (for herpes flare).      vitamin B-12 (CYANOCOBALAMIN) 500 MCG tablet Take 500 mcg by mouth daily.     No current facility-administered medications on file prior to visit.  PAST MEDICAL HISTORY: Past Medical History:  Diagnosis Date   Acne    Anxiety    takes Xanax daily as needed   Arthritis    Atrial flutter, paroxysmal (HCC)    History of kidney stones    Hyperlipidemia    takes Pravastatin daily   Hypertension    takes Amlodipine and Valsartan-HCTZ daily   Hypothyroidism     takes Synthroid daily   Inguinal hernia    left side   Internal hemorrhoids    Joint pain    Kidney stones    Pre-diabetes    Rosacea    TIA (transient ischemic attack) 06/2019    PAST SURGICAL HISTORY: Past Surgical History:  Procedure Laterality Date   COLONOSCOPY     EXTRACORPOREAL SHOCK WAVE LITHOTRIPSY Right 12/27/2019   Procedure: EXTRACORPOREAL SHOCK WAVE LITHOTRIPSY (ESWL);  Surgeon: Malen Gauze, MD;  Location: [facility];  Service: Urology;  Laterality: Right;   LITHOTRIPSY  09/2015   LUMBAR LAMINECTOMY WITH COFLEX 1 LEVEL N/A 07/07/2015   Procedure: LUMBAR FOUR-FIVE LUMBAR LAMINECTOMY WITH COFLEX;  Surgeon: Tia Alert, MD;  Location: [facility];  Service: Neurosurgery;  Laterality: N/A;   TONSILLECTOMY     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   Procedure: TOTAL HIP ARTHROPLASTY;  Surgeon: Frederico Hamman, MD;  Location: [facility];  Service: Orthopedics;  Laterality: Left;   TOTAL HIP ARTHROPLASTY Right 04/24/2021   Procedure: TOTAL HIP ARTHROPLASTY;  Surgeon: Frederico Hamman, MD;  Location: [facility];  Service: Orthopedics;  Laterality: Right;    FAMILY HISTORY: The patient's family history includes Alcoholism in his mother; Aneurysm in his sister; Atrial fibrillation in his sister; CVA in his father; Cancer in his maternal grandfather; Diabetes in his paternal grandmother; Hypertension in his father, paternal grandmother, and sister; Sleep disorder in his father.   SOCIAL HISTORY:  The patient  reports that he quit smoking about 32 years ago. His smoking use included cigarettes. He has a 15.00 pack-year smoking history. He has never used smokeless tobacco. He reports that he does not drink alcohol and does not use drugs.  Review of Systems  Constitutional: Negative for chills and fever.  HENT:  Negative for hoarse voice and nosebleeds.   Eyes:  Negative for discharge, double vision and pain.  Cardiovascular:  Negative for chest pain,  claudication, dyspnea on exertion, leg swelling, near-syncope, orthopnea, palpitations, paroxysmal nocturnal dyspnea and syncope.  Respiratory:  Negative for hemoptysis and shortness of breath.   Musculoskeletal:  Negative for muscle cramps and myalgias.  Gastrointestinal:  Negative for abdominal pain, constipation, diarrhea, hematemesis, hematochezia, melena, nausea and vomiting.  Neurological:  Negative for dizziness and light-headedness.   PHYSICAL EXAM: Vitals with BMI 06/04/2021 06/04/2021 04/30/2021  Height - 5\' 6"  -  Weight - 199 lbs 10 oz -  BMI - 32.23 -  Systolic 175 178 607  Diastolic 89 97 79  Pulse 72 60 95    CONSTITUTIONAL: Well-developed and well-nourished. No acute distress.  SKIN: Skin is warm and dry. No rash noted. No cyanosis. No pallor. No jaundice HEAD: Normocephalic and atraumatic.  EYES: No scleral icterus MOUTH/THROAT: Moist oral membranes.  NECK: No JVD present. No thyromegaly noted. No carotid bruits  LYMPHATIC: No visible cervical adenopathy.  CHEST Normal respiratory effort. No intercostal retractions  LUNGS: Clear to auscultation bilaterally.  No stridor. No wheezes. No rales.  CARDIOVASCULAR: Regular rate and rhythm, positive S1-S2, no murmurs rubs or gallops appreciated. ABDOMINAL: Soft, nontender, nondistended, positive bowel  sounds in all 4 quadrants.  No apparent ascites.  EXTREMITIES: No peripheral edema. Pulses:       Femoral pulses are 2+ on the right side and 2+ on the left side.      Popliteal pulses are 1+ on the right side and 1+ on the left side.       Dorsalis pedis pulses are 1+ on the right side and 1+ on the left side.       Posterior tibial pulses are 1+ on the right side and 1+ on the left side.  HEMATOLOGIC: No significant bruising NEUROLOGIC: Oriented to person, place, and time. Nonfocal. Normal muscle tone.  PSYCHIATRIC: Normal mood and affect. Normal behavior. Cooperative  CARDIAC DATABASE: EKG: 06/04/2021: Normal sinus  rhythm, 66 bpm, normal axis, without underlying ischemia or injury pattern.    Echocardiogram: 04/29/2021:  1. Left ventricular ejection fraction, by estimation, is 60 to 65%. The left ventricle has normal function. The left ventricle has no regional wall motion abnormalities. Left ventricular diastolic function could not be evaluated due to atrial flutter.   2. Right ventricular systolic function is normal. The right ventricular size is normal. There is normal pulmonary artery systolic pressure.   3. The mitral valve is normal in structure. No evidence of mitral valve regurgitation. No evidence of mitral stenosis.   4. The aortic valve is tricuspid. Aortic valve regurgitation is not visualized. No aortic stenosis is present.   Stress Testing:  Exercise myoview stress test  04/23/2019: Patient exercised for a total of 6:13 min., achieving approximately 7 METs. Normal BP response. Exercise was terminated due to fatigue/weakness and achieving THR. Normal myocardial perfusion. All segments of left ventricle demonstrated normal wall motion and thickening. Stress LV EF is hyperdynamic 75%. Low risk study.    Event Monitor for 30 days Start date 07/11/2019: There were no symptomatic triggered events. Predominant rhythm is normal sinus rhythm. Rare PACs and PVCs. No atrial fibrillation.   21 day event monitor 07/07-07/27/2020: NSR with occasional PAC/PVC. 1 patient triggered event without reported symptoms correlated with NSR with PAC and PVC. 2 auto detected events occurred for NSR with 5 beats of NSVT a t 173 bpm on day 1 at 1453 and NSR with 6 beats of NSVT on day 12 at 1651. Both were asymptomatic. 1 auto detected event on day 14 for Sinus tachycardia with frequent PAC's. No A fib was noted.  LABORATORY DATA: CBC Latest Ref Rng & Units 04/30/2021 04/29/2021 04/28/2021  WBC 4.0 - 10.5 K/uL 7.8 6.3 -  Hemoglobin 13.0 - 17.0 g/dL 11.0(L) 11.1(L) 11.0(L)  Hematocrit 39.0 - 52.0 % 31.8(L) 32.1(L) 31.3(L)   Platelets 150 - 400 K/uL 170 159 -    CMP Latest Ref Rng & Units 05/29/2021 04/30/2021 04/29/2021  Glucose 70 - 99 mg/dL 202(R) 427(C) 623(J)  BUN 8 - 27 mg/dL 10 16 15   Creatinine 0.76 - 1.27 mg/dL 6.28 3.15 1.76  Sodium 134 - 144 mmol/L 144 137 139  Potassium 3.5 - 5.2 mmol/L 4.5 4.0 4.2  Chloride 96 - 106 mmol/L 103 108 109  CO2 20 - 29 mmol/L 25 24 24   Calcium 8.6 - 10.2 mg/dL 9.6 1.6(W) 7.3(X)  Total Protein 6.5 - 8.1 g/dL - - 6.5  Total Bilirubin 0.3 - 1.2 mg/dL - - 1.0  Alkaline Phos 38 - 126 U/L - - 47  AST 15 - 41 U/L - - 150(H)  ALT 0 - 44 U/L - - 174(H)    Lipid Panel  Component Value Date/Time   CHOL 139 07/12/2019 1026   TRIG 115 07/12/2019 1026   HDL 46 07/12/2019 1026   CHOLHDL 3.0 07/12/2019 1026   LDLCALC 72 07/12/2019 1026   LABVLDL 21 07/12/2019 1026    Lab Results  Component Value Date   HGBA1C 6.0 (H) 04/16/2021   HGBA1C 5.8 (H) 07/12/2016   HGBA1C 6.1 (H) 06/27/2015   No components found for: NTPROBNP Lab Results  Component Value Date   TSH 3.918 04/30/2021    Cardiac Panel (last 3 results) No results for input(s): CKTOTAL, CKMB, TROPONINIHS, RELINDX in the last 72 hours.  External Labs: Collected: 05/14/2021 provided by PCP Hemoglobin 10.6 g/dL, hematocrit 57.8%. Sodium 140, potassium 4.4, chloride 105, bicarb 27, BUN 14, creatinine 0.87 AST 18, ALT 22, alkaline phosphatase 67  IMPRESSION:    ICD-10-CM   1. Paroxysmal atrial flutter (HCC)  I48.92 EKG 12-Lead    metoprolol succinate (TOPROL XL) 25 MG 24 hr tablet    2. Long term (current) use of anticoagulants  Z79.01     3. Essential hypertension  I10 hydrochlorothiazide (HYDRODIURIL) 25 MG tablet    olmesartan (BENICAR) 40 MG tablet    Basic metabolic panel    Magnesium    4. TIA (transient ischemic attack)  G45.9     5. Pure hypercholesterolemia  E78.00     6. OSA on CPAP  G47.33    Z99.89     7. Former smoker  Z87.891        RECOMMENDATIONS: Norman Hudson. is a 70 y.o. male whose past medical history and cardiovascular risk factors include: prediabetes, hypertension, hyperlipidemia, chronically low testosterone, hypothyroidism, former smoker with 15 pack year history, anxiety, osteoarthritis, history of TIA, advanced age.  Paroxysmal atrial flutter (HCC) Rate control: Metoprolol. Rhythm control: N/A. Thromboembolic prophylaxis: Eliquis We will transition him from Lopressor 12.5 mg p.o. twice daily to Toprol-XL 25 mg p.o. daily.  EKG today shows normal sinus rhythm.  Long term (current) use of anticoagulants CHA2DS2-VASc SCORE is 4 which correlates to 4.6% risk of stroke per year (history of TIA, age, HTN). Both patient and his wife verbalized understanding with regards to risks, benefits, and alternatives to oral anticoagulation. Patient does not endorse any evidence of bleeding.  Essential hypertension Not well controlled Due to postop anemia secondary to blood loss and hypotension in the setting of atrial flutter patient was noted to have orthostatic hypotension.  His antihypertensive medications were down titrated. Office blood pressures are now well controlled. Home blood pressures are better controlled but currently not at goal. As discussed above transition Lopressor to Toprol-XL. Increase hydrochlorothiazide to 25 mg p.o. every morning Add olmesartan 40 mg p.o. every afternoon Blood work in 1 week to evaluate kidney function and electrolytes. Reemphasized the importance of a low-salt diet  TIA (transient ischemic attack) Patient was on aspirin and Plavix prior to the diagnosis of atrial flutter.  However to minimize risk of bleeding currently on Eliquis only. Educated in the importance of risk factor modifications.  Pure hypercholesterolemia Currently on Crestor.   He denies myalgia or other side effects. Currently managed by primary care provider.   OSA on CPAP Emphasized compliance with CPAP. Follows with Dr.  Vickey Huger.  Former smoker Educated on the importance of continued smoking cessation.   FINAL MEDICATION LIST END OF ENCOUNTER: Meds ordered this encounter  Medications   hydrochlorothiazide (HYDRODIURIL) 25 MG tablet    Sig: Take 1 tablet (25 mg total) by mouth every morning.  Dispense:  90 tablet    Refill:  0   metoprolol succinate (TOPROL XL) 25 MG 24 hr tablet    Sig: Take 1 tablet (25 mg total) by mouth every morning.    Dispense:  90 tablet    Refill:  0   olmesartan (BENICAR) 40 MG tablet    Sig: Take 1 tablet (40 mg total) by mouth daily at 10 pm.    Dispense:  90 tablet    Refill:  0     Current Outpatient Medications:    acetaminophen (TYLENOL) 325 MG tablet, Take 2 tablets (650 mg total) by mouth every 4 (four) hours as needed., Disp: 100 tablet, Rfl: 2   ALPRAZolam (XANAX) 0.5 MG tablet, Take 0.5 mg by mouth at bedtime as needed for anxiety., Disp: , Rfl:    apixaban (ELIQUIS) 5 MG TABS tablet, Take 1 tablet (5 mg total) by mouth 2 (two) times daily., Disp: 60 tablet, Rfl: 0   cholecalciferol (VITAMIN D3) 25 MCG (1000 UT) tablet, Take 1,000 Units by mouth daily., Disp: , Rfl:    levothyroxine (SYNTHROID, LEVOTHROID) 100 MCG tablet, Take 100 mcg by mouth daily before breakfast., Disp: , Rfl:    metFORMIN (GLUCOPHAGE) 500 MG tablet, Take 500 mg by mouth daily with breakfast., Disp: , Rfl:    metoprolol succinate (TOPROL XL) 25 MG 24 hr tablet, Take 1 tablet (25 mg total) by mouth every morning., Disp: 90 tablet, Rfl: 0   Multiple Vitamins-Minerals (MULTIVITAMIN WITH MINERALS) tablet, Take 1 tablet by mouth daily., Disp: , Rfl:    olmesartan (BENICAR) 40 MG tablet, Take 1 tablet (40 mg total) by mouth daily at 10 pm., Disp: 90 tablet, Rfl: 0   pyridOXINE (VITAMIN B-6) 100 MG tablet, Take 100 mg by mouth daily., Disp: , Rfl:    rosuvastatin (CRESTOR) 20 MG tablet, TAKE ONE TABLET AT BEDTIME. (Patient taking differently: Take 20 mg by mouth at bedtime.), Disp: 90 tablet,  Rfl: 2   spironolactone (ALDACTONE) 25 MG tablet, Take 1 tablet (25 mg total) by mouth daily., Disp: 90 tablet, Rfl: 3   tiZANidine (ZANAFLEX) 4 MG tablet, Take 1 tablet (4 mg total) by mouth every 8 (eight) hours as needed for muscle spasms., Disp: 30 tablet, Rfl: 1   valACYclovir (VALTREX) 1000 MG tablet, Take 1,000 mg by mouth daily as needed (for herpes flare). , Disp: , Rfl:    vitamin B-12 (CYANOCOBALAMIN) 500 MCG tablet, Take 500 mcg by mouth daily., Disp: , Rfl:    hydrochlorothiazide (HYDRODIURIL) 25 MG tablet, Take 1 tablet (25 mg total) by mouth every morning., Disp: 90 tablet, Rfl: 0  Orders Placed This Encounter  Procedures   Basic metabolic panel   Magnesium   EKG 12-Lead   --Continue cardiac medications as reconciled in final medication list. --Return in about 4 weeks (around 07/02/2021) for Follow up, BP. Or sooner if needed. --Continue follow-up with your primary care physician regarding the management of your other chronic comorbid conditions.  Patient's questions and concerns were addressed to his satisfaction. He voices understanding of the instructions provided during this encounter.   This note was created using a voice recognition software as a result there may be grammatical errors inadvertently enclosed that do not reflect the nature of this encounter. Every attempt is made to correct such errors.  Tessa Lerner, [place], [facility]  Pager: [phone] Office: [phone]

## 2021-06-08 DIAGNOSIS — M1611 Unilateral primary osteoarthritis, right hip: Secondary | ICD-10-CM | POA: Diagnosis not present

## 2021-06-09 ENCOUNTER — Other Ambulatory Visit: Payer: Self-pay | Admitting: Neurology

## 2021-06-09 ENCOUNTER — Telehealth: Payer: Self-pay | Admitting: Neurology

## 2021-06-09 MED ORDER — CLONAZEPAM 0.5 MG PO TABS: ORAL_TABLET | ORAL | 1 refills | Status: DC

## 2021-06-09 NOTE — Telephone Encounter (Signed)
Pt called wanting to know why he was denied for his Clonazepam refill. Pt states he has been seen this year and is scheduled for a follow up. Please advise.

## 2021-06-09 NOTE — Telephone Encounter (Signed)
Called pt back. Apologized for the confusion. Explained it was not on active med list and no mention in last OV note about continuing it. He verified it is not on active med list, but should be. Has not needed a refill in over a yr since he takes prn for sleep/anxiety. Would like refill request to go to Dr. Brett Fairy for 90 days supply. Last seen 03/18/21 and next follow up 09/23/21.  Checked drug registry, last refilled 01/08/21 #90.

## 2021-06-09 NOTE — Telephone Encounter (Signed)
Confirmed rx called in by MD.

## 2021-06-11 ENCOUNTER — Other Ambulatory Visit: Payer: Self-pay | Admitting: Cardiology

## 2021-06-11 DIAGNOSIS — I4891 Unspecified atrial fibrillation: Secondary | ICD-10-CM | POA: Diagnosis not present

## 2021-06-11 DIAGNOSIS — M1610 Unilateral primary osteoarthritis, unspecified hip: Secondary | ICD-10-CM | POA: Diagnosis not present

## 2021-06-11 DIAGNOSIS — E039 Hypothyroidism, unspecified: Secondary | ICD-10-CM | POA: Diagnosis not present

## 2021-06-11 DIAGNOSIS — E7800 Pure hypercholesterolemia, unspecified: Secondary | ICD-10-CM | POA: Diagnosis not present

## 2021-06-11 DIAGNOSIS — I10 Essential (primary) hypertension: Secondary | ICD-10-CM | POA: Diagnosis not present

## 2021-06-11 DIAGNOSIS — H35033 Hypertensive retinopathy, bilateral: Secondary | ICD-10-CM | POA: Diagnosis not present

## 2021-06-12 LAB — BASIC METABOLIC PANEL
BUN/Creatinine Ratio: 18 (ref 10–24)
BUN: 14 mg/dL (ref 8–27)
CO2: 22 mmol/L (ref 20–29)
Calcium: 9.9 mg/dL (ref 8.6–10.2)
Chloride: 104 mmol/L (ref 96–106)
Creatinine, Ser: 0.78 mg/dL (ref 0.76–1.27)
Glucose: 124 mg/dL — ABNORMAL HIGH (ref 70–99)
Potassium: 4.3 mmol/L (ref 3.5–5.2)
Sodium: 143 mmol/L (ref 134–144)
eGFR: 95 mL/min/{1.73_m2} (ref >59)

## 2021-06-12 LAB — MAGNESIUM: Magnesium: 1.8 mg/dL (ref 1.6–2.3)

## 2021-06-15 DIAGNOSIS — S76011D Strain of muscle, fascia and tendon of right hip, subsequent encounter: Secondary | ICD-10-CM | POA: Diagnosis not present

## 2021-06-15 DIAGNOSIS — Z96641 Presence of right artificial hip joint: Secondary | ICD-10-CM | POA: Diagnosis not present

## 2021-06-15 DIAGNOSIS — M6281 Muscle weakness (generalized): Secondary | ICD-10-CM | POA: Diagnosis not present

## 2021-06-17 DIAGNOSIS — Z96641 Presence of right artificial hip joint: Secondary | ICD-10-CM | POA: Diagnosis not present

## 2021-06-17 DIAGNOSIS — M6281 Muscle weakness (generalized): Secondary | ICD-10-CM | POA: Diagnosis not present

## 2021-06-17 DIAGNOSIS — S76011D Strain of muscle, fascia and tendon of right hip, subsequent encounter: Secondary | ICD-10-CM | POA: Diagnosis not present

## 2021-06-22 DIAGNOSIS — M6281 Muscle weakness (generalized): Secondary | ICD-10-CM | POA: Diagnosis not present

## 2021-06-22 DIAGNOSIS — S76011D Strain of muscle, fascia and tendon of right hip, subsequent encounter: Secondary | ICD-10-CM | POA: Diagnosis not present

## 2021-06-22 DIAGNOSIS — Z96641 Presence of right artificial hip joint: Secondary | ICD-10-CM | POA: Diagnosis not present

## 2021-06-24 ENCOUNTER — Other Ambulatory Visit: Payer: Self-pay

## 2021-06-24 DIAGNOSIS — Z96641 Presence of right artificial hip joint: Secondary | ICD-10-CM | POA: Diagnosis not present

## 2021-06-24 DIAGNOSIS — M6281 Muscle weakness (generalized): Secondary | ICD-10-CM | POA: Diagnosis not present

## 2021-06-24 DIAGNOSIS — I10 Essential (primary) hypertension: Secondary | ICD-10-CM

## 2021-06-24 DIAGNOSIS — S76011D Strain of muscle, fascia and tendon of right hip, subsequent encounter: Secondary | ICD-10-CM | POA: Diagnosis not present

## 2021-06-25 DIAGNOSIS — S76011D Strain of muscle, fascia and tendon of right hip, subsequent encounter: Secondary | ICD-10-CM | POA: Diagnosis not present

## 2021-07-06 ENCOUNTER — Encounter: Payer: Self-pay | Admitting: Cardiology

## 2021-07-06 ENCOUNTER — Other Ambulatory Visit: Payer: Self-pay

## 2021-07-06 ENCOUNTER — Ambulatory Visit: Payer: Medicare HMO | Admitting: Cardiology

## 2021-07-06 VITALS — BP 132/72 | HR 68 | Resp 16 | Ht 66.0 in | Wt 198.6 lb

## 2021-07-06 DIAGNOSIS — E7800 Pure hypercholesterolemia, unspecified: Secondary | ICD-10-CM | POA: Diagnosis not present

## 2021-07-06 DIAGNOSIS — I10 Essential (primary) hypertension: Secondary | ICD-10-CM | POA: Diagnosis not present

## 2021-07-06 DIAGNOSIS — Z7901 Long term (current) use of anticoagulants: Secondary | ICD-10-CM

## 2021-07-06 DIAGNOSIS — I4892 Unspecified atrial flutter: Secondary | ICD-10-CM

## 2021-07-06 DIAGNOSIS — G459 Transient cerebral ischemic attack, unspecified: Secondary | ICD-10-CM

## 2021-07-06 DIAGNOSIS — Z9989 Dependence on other enabling machines and devices: Secondary | ICD-10-CM

## 2021-07-06 DIAGNOSIS — Z87891 Personal history of nicotine dependence: Secondary | ICD-10-CM | POA: Diagnosis not present

## 2021-07-06 DIAGNOSIS — G4733 Obstructive sleep apnea (adult) (pediatric): Secondary | ICD-10-CM | POA: Diagnosis not present

## 2021-07-06 NOTE — Progress Notes (Signed)
Norman Hudson Norman Hudson. Date of Birth: 1951/05/18 MRN: [id number] Primary Care Provider:Timberlake, Meridee Score, MD Former Cardiology Providers: Altamese Mallard, APRN, FNP-C Primary Cardiologist: Norman Lerner, DO, [facility] (established care 02/21/2020)  Date: 07/06/21 Last Office Visit: 06/04/2021  Chief Complaint  Patient presents with   Follow-up   Atrial Flutter    HPI  Norman Conliffe. is a 70 y.o.  male who presents to the office with a chief complaint of " follow-up for atrial flutter and blood pressure management." Patient's past medical history and cardiovascular risk factors include: Paroxysmal atrial flutter (04/2021), prediabetes, hypertension, hyperlipidemia, chronically low testosterone, hypothyroidism, former smoker with 15 pack year history, anxiety, osteoarthritis, history of TIA, advanced age.  He is being followed by the practice longitudinally for the management of hypertension, PACs, and paroxysmal atrial flutter.  After his hip replacement patient was noted to be in atrial flutter and since then has been on oral anticoagulation given his CHA2DS2-VASc score.  He is tolerated oral anticoagulation well without any side effects or intolerances.  Reiterated the risks, benefits, and alternatives to oral anticoagulation during today's encounter.  At the last office visit patient's blood pressure was not well controlled but overall improving.  His medications were down titrated postsurgery due to orthostasis and a flutter with RVR. At the last office visit his Lopressor was transitioned to Toprol-XL.  Increase hydrochlorothiazide to 25 mg p.o. every morning and added olmesartan 40 mg p.o. every afternoon.  He had repeat blood work a week later to evaluate kidney function and electrolytes.    Since last office visit patient is doing well from a cardiovascular standpoint is very happy with his blood pressure readings at home.  Patient states that his blood pressures at home  have stabilized with systolic blood pressures averaging around 110 mmHg and diastolic blood pressure around 75 mmHg and a pulse of 60 bpm over last 2 weeks.  Patient does not endorse any evidence of bleeding.  FUNCTIONAL STATUS: No structured exercise program or daily routine.    ALLERGIES: Allergies  Allergen Reactions   Bactrim [Sulfamethoxazole-Trimethoprim]     "threw me for a loop, hives, itching, extreme fatigue"    MEDICATION LIST PRIOR TO VISIT: Current Outpatient Medications on File Prior to Visit  Medication Sig Dispense Refill   acetaminophen (TYLENOL) 325 MG tablet Take 2 tablets (650 mg total) by mouth every 4 (four) hours as needed. 100 tablet 2   apixaban (ELIQUIS) 5 MG TABS tablet Take 1 tablet (5 mg total) by mouth 2 (two) times daily. 60 tablet 0   cholecalciferol (VITAMIN D3) 25 MCG (1000 UT) tablet Take 1,000 Units by mouth daily.     clonazePAM (KLONOPIN) 0.5 MG tablet TAKE 0.5- 1 TABLET AT BEDTIME AS NEEDED 90 tablet 1   hydrochlorothiazide (HYDRODIURIL) 25 MG tablet Take 1 tablet (25 mg total) by mouth every morning. 90 tablet 0   levothyroxine (SYNTHROID, LEVOTHROID) 100 MCG tablet Take 100 mcg by mouth daily before breakfast.     metFORMIN (GLUCOPHAGE) 500 MG tablet Take 500 mg by mouth daily with breakfast.     metoprolol succinate (TOPROL XL) 25 MG 24 hr tablet Take 1 tablet (25 mg total) by mouth every morning. 90 tablet 0   Multiple Vitamins-Minerals (MULTIVITAMIN WITH MINERALS) tablet Take 1 tablet by mouth daily.     olmesartan (BENICAR) 40 MG tablet Take 1 tablet (40 mg total) by mouth daily at 10 pm. 90 tablet 0   pyridOXINE (VITAMIN B-6) 100 MG  tablet Take 100 mg by mouth daily.     rosuvastatin (CRESTOR) 20 MG tablet TAKE ONE TABLET AT BEDTIME. (Patient taking differently: Take 20 mg by mouth at bedtime.) 90 tablet 2   spironolactone (ALDACTONE) 25 MG tablet Take 1 tablet (25 mg total) by mouth daily. 90 tablet 3   valACYclovir (VALTREX) 1000 MG tablet  Take 1,000 mg by mouth daily as needed (for herpes flare).      vitamin B-12 (CYANOCOBALAMIN) 500 MCG tablet Take 500 mcg by mouth daily.     No current facility-administered medications on file prior to visit.    PAST MEDICAL HISTORY: Past Medical History:  Diagnosis Date   Acne    Anxiety    takes Xanax daily as needed   Arthritis    Atrial flutter, paroxysmal (HCC)    History of kidney stones    Hyperlipidemia    takes Pravastatin daily   Hypertension    takes Amlodipine and Valsartan-HCTZ daily   Hypothyroidism    takes Synthroid daily   Inguinal hernia    left side   Internal hemorrhoids    Joint pain    Kidney stones    Pre-diabetes    Rosacea    TIA (transient ischemic attack) 06/2019    PAST SURGICAL HISTORY: Past Surgical History:  Procedure Laterality Date   COLONOSCOPY     EXTRACORPOREAL SHOCK WAVE LITHOTRIPSY Right 12/27/2019   Procedure: EXTRACORPOREAL SHOCK WAVE LITHOTRIPSY (ESWL);  Surgeon: Malen Gauze, MD;  Location: [facility];  Service: Urology;  Laterality: Right;   LITHOTRIPSY  09/2015   LUMBAR LAMINECTOMY WITH COFLEX 1 LEVEL N/A 07/07/2015   Procedure: LUMBAR FOUR-FIVE LUMBAR LAMINECTOMY WITH COFLEX;  Surgeon: Tia Alert, MD;  Location: [facility];  Service: Neurosurgery;  Laterality: N/A;   TONSILLECTOMY     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   Procedure: TOTAL HIP ARTHROPLASTY;  Surgeon: Frederico Hamman, MD;  Location: [facility];  Service: Orthopedics;  Laterality: Left;   TOTAL HIP ARTHROPLASTY Right 04/24/2021   Procedure: TOTAL HIP ARTHROPLASTY;  Surgeon: Frederico Hamman, MD;  Location: [facility];  Service: Orthopedics;  Laterality: Right;    FAMILY HISTORY: The patient's family history includes Alcoholism in his mother; Aneurysm in his sister; Atrial fibrillation in his sister; CVA in his father; Cancer in his maternal grandfather; Diabetes in his paternal grandmother; Hypertension in his father, paternal grandmother,  and sister; Sleep disorder in his father.   SOCIAL HISTORY:  The patient  reports that he quit smoking about 32 years ago. His smoking use included cigarettes. He has a 15.00 pack-year smoking history. He has never used smokeless tobacco. He reports that he does not drink alcohol and does not use drugs.  Review of Systems  Constitutional: Negative for chills and fever.  HENT:  Negative for hoarse voice and nosebleeds.   Eyes:  Negative for discharge, double vision and pain.  Cardiovascular:  Negative for chest pain, claudication, dyspnea on exertion, leg swelling, near-syncope, orthopnea, palpitations, paroxysmal nocturnal dyspnea and syncope.  Respiratory:  Negative for hemoptysis and shortness of breath.   Musculoskeletal:  Negative for muscle cramps and myalgias.  Gastrointestinal:  Negative for abdominal pain, constipation, diarrhea, hematemesis, hematochezia, melena, nausea and vomiting.  Neurological:  Negative for dizziness and light-headedness.   PHYSICAL EXAM: Vitals with BMI 07/06/2021 06/04/2021 06/04/2021  Height 5\' 6"  - 5\' 6"   Weight 198 lbs 10 oz - 199 lbs 10 oz  BMI 32.07 - 32.23  Systolic 132 175  178  Diastolic 72 89 97  Pulse 68 72 60    CONSTITUTIONAL: Well-developed and well-nourished. No acute distress.  SKIN: Skin is warm and dry. No rash noted. No cyanosis. No pallor. No jaundice HEAD: Normocephalic and atraumatic.  EYES: No scleral icterus MOUTH/THROAT: Moist oral membranes.  NECK: No JVD present. No thyromegaly noted. No carotid bruits  LYMPHATIC: No visible cervical adenopathy.  CHEST Normal respiratory effort. No intercostal retractions  LUNGS: Clear to auscultation bilaterally.  No stridor. No wheezes. No rales.  CARDIOVASCULAR: Regular rate and rhythm, positive S1-S2, no murmurs rubs or gallops appreciated. ABDOMINAL: Soft, nontender, nondistended, positive bowel sounds in all 4 quadrants.  No apparent ascites.  EXTREMITIES: No peripheral  edema. Pulses:       Femoral pulses are 2+ on the right side and 2+ on the left side.      Popliteal pulses are 1+ on the right side and 1+ on the left side.       Dorsalis pedis pulses are 1+ on the right side and 1+ on the left side.       Posterior tibial pulses are 1+ on the right side and 1+ on the left side.  HEMATOLOGIC: No significant bruising NEUROLOGIC: Oriented to person, place, and time. Nonfocal. Normal muscle tone.  PSYCHIATRIC: Normal mood and affect. Normal behavior. Cooperative  CARDIAC DATABASE: EKG: 06/04/2021: Normal sinus rhythm, 66 bpm, normal axis, without underlying ischemia or injury pattern.    Echocardiogram: 04/29/2021:  1. Left ventricular ejection fraction, by estimation, is 60 to 65%. The left ventricle has normal function. The left ventricle has no regional wall motion abnormalities. Left ventricular diastolic function could not be evaluated due to atrial flutter.   2. Right ventricular systolic function is normal. The right ventricular size is normal. There is normal pulmonary artery systolic pressure.   3. The mitral valve is normal in structure. No evidence of mitral valve regurgitation. No evidence of mitral stenosis.   4. The aortic valve is tricuspid. Aortic valve regurgitation is not visualized. No aortic stenosis is present.   Stress Testing:  Exercise myoview stress test  04/23/2019: Patient exercised for a total of 6:13 min., achieving approximately 7 METs. Normal BP response. Exercise was terminated due to fatigue/weakness and achieving THR. Normal myocardial perfusion. All segments of left ventricle demonstrated normal wall motion and thickening. Stress LV EF is hyperdynamic 75%. Low risk study.    Event Monitor for 30 days Start date 07/11/2019: There were no symptomatic triggered events. Predominant rhythm is normal sinus rhythm. Rare PACs and PVCs. No atrial fibrillation.   21 day event monitor 07/07-07/27/2020: NSR with occasional PAC/PVC. 1  patient triggered event without reported symptoms correlated with NSR with PAC and PVC. 2 auto detected events occurred for NSR with 5 beats of NSVT a t 173 bpm on day 1 at 1453 and NSR with 6 beats of NSVT on day 12 at 1651. Both were asymptomatic. 1 auto detected event on day 14 for Sinus tachycardia with frequent PAC's. No A fib was noted.  LABORATORY DATA: CBC Latest Ref Rng & Units 04/30/2021 04/29/2021 04/28/2021  WBC 4.0 - 10.5 K/uL 7.8 6.3 -  Hemoglobin 13.0 - 17.0 g/dL 11.0(L) 11.1(L) 11.0(L)  Hematocrit 39.0 - 52.0 % 31.8(L) 32.1(L) 31.3(L)  Platelets 150 - 400 K/uL 170 159 -    CMP Latest Ref Rng & Units 06/11/2021 05/29/2021 04/30/2021  Glucose 70 - 99 mg/dL 259(D) 638(V) 564(P)  BUN 8 - 27 mg/dL 14 10 16  Creatinine 0.76 - 1.27 mg/dL 1.61 0.96 0.45  Sodium 134 - 144 mmol/L 143 144 137  Potassium 3.5 - 5.2 mmol/L 4.3 4.5 4.0  Chloride 96 - 106 mmol/L 104 103 108  CO2 20 - 29 mmol/L 22 25 24   Calcium 8.6 - 10.2 mg/dL 9.9 9.6 4.0(J)  Total Protein 6.5 - 8.1 g/dL - - -  Total Bilirubin 0.3 - 1.2 mg/dL - - -  Alkaline Phos 38 - 126 U/L - - -  AST 15 - 41 U/L - - -  ALT 0 - 44 U/L - - -    Lipid Panel     Component Value Date/Time   CHOL 139 07/12/2019 1026   TRIG 115 07/12/2019 1026   HDL 46 07/12/2019 1026   CHOLHDL 3.0 07/12/2019 1026   LDLCALC 72 07/12/2019 1026   LABVLDL 21 07/12/2019 1026    Lab Results  Component Value Date   HGBA1C 6.0 (H) 04/16/2021   HGBA1C 5.8 (H) 07/12/2016   HGBA1C 6.1 (H) 06/27/2015   No components found for: NTPROBNP Lab Results  Component Value Date   TSH 3.918 04/30/2021    Cardiac Panel (last 3 results) No results for input(s): CKTOTAL, CKMB, TROPONINIHS, RELINDX in the last 72 hours.  External Labs: Collected: 05/14/2021 provided by PCP Hemoglobin 10.6 g/dL, hematocrit 81.1%. Sodium 140, potassium 4.4, chloride 105, bicarb 27, BUN 14, creatinine 0.87 AST 18, ALT 22, alkaline phosphatase 67  IMPRESSION:    ICD-10-CM   1.  Paroxysmal atrial flutter (HCC)  I48.92     2. Long term (current) use of anticoagulants  Z79.01     3. Essential hypertension  I10     4. TIA (transient ischemic attack)  G45.9     5. Pure hypercholesterolemia  E78.00     6. OSA on CPAP  G47.33    Z99.89     7. Former smoker  Z87.891        RECOMMENDATIONS: Norman Krahmer. is a 70 y.o. male whose past medical history and cardiovascular risk factors include: prediabetes, hypertension, hyperlipidemia, chronically low testosterone, hypothyroidism, former smoker with 15 pack year history, anxiety, osteoarthritis, history of TIA, advanced age.  Paroxysmal atrial flutter (HCC) Rate control: Metoprolol. Rhythm control: N/A. Thromboembolic prophylaxis: Eliquis  Long term (current) use of anticoagulants CHA2DS2-VASc SCORE is 4 which correlates to 4.6% risk of stroke per year (history of TIA, age, HTN). Does not endorse evidence of bleeding. Reiterated the risks, benefits, and alternatives to oral anticoagulation.  Essential hypertension Well controlled. Continue current medical therapy. Reemphasized the importance of a low-salt diet  TIA (transient ischemic attack): Educated importance of secondary prevention.  Pure hypercholesterolemia Currently on Crestor.   He denies myalgia or other side effects. Currently managed by primary care provider.  OSA on CPAP Emphasized compliance with CPAP. Follows with Dr. Vickey Huger.  Former smoker Educated on the importance of continued smoking cessation.   FINAL MEDICATION LIST END OF ENCOUNTER: No orders of the defined types were placed in this encounter.    Current Outpatient Medications:    acetaminophen (TYLENOL) 325 MG tablet, Take 2 tablets (650 mg total) by mouth every 4 (four) hours as needed., Disp: 100 tablet, Rfl: 2   apixaban (ELIQUIS) 5 MG TABS tablet, Take 1 tablet (5 mg total) by mouth 2 (two) times daily., Disp: 60 tablet, Rfl: 0   cholecalciferol (VITAMIN  D3) 25 MCG (1000 UT) tablet, Take 1,000 Units by mouth daily., Disp: , Rfl:    clonazePAM (  KLONOPIN) 0.5 MG tablet, TAKE 0.5- 1 TABLET AT BEDTIME AS NEEDED, Disp: 90 tablet, Rfl: 1   hydrochlorothiazide (HYDRODIURIL) 25 MG tablet, Take 1 tablet (25 mg total) by mouth every morning., Disp: 90 tablet, Rfl: 0   levothyroxine (SYNTHROID, LEVOTHROID) 100 MCG tablet, Take 100 mcg by mouth daily before breakfast., Disp: , Rfl:    metFORMIN (GLUCOPHAGE) 500 MG tablet, Take 500 mg by mouth daily with breakfast., Disp: , Rfl:    metoprolol succinate (TOPROL XL) 25 MG 24 hr tablet, Take 1 tablet (25 mg total) by mouth every morning., Disp: 90 tablet, Rfl: 0   Multiple Vitamins-Minerals (MULTIVITAMIN WITH MINERALS) tablet, Take 1 tablet by mouth daily., Disp: , Rfl:    olmesartan (BENICAR) 40 MG tablet, Take 1 tablet (40 mg total) by mouth daily at 10 pm., Disp: 90 tablet, Rfl: 0   pyridOXINE (VITAMIN B-6) 100 MG tablet, Take 100 mg by mouth daily., Disp: , Rfl:    rosuvastatin (CRESTOR) 20 MG tablet, TAKE ONE TABLET AT BEDTIME. (Patient taking differently: Take 20 mg by mouth at bedtime.), Disp: 90 tablet, Rfl: 2   spironolactone (ALDACTONE) 25 MG tablet, Take 1 tablet (25 mg total) by mouth daily., Disp: 90 tablet, Rfl: 3   valACYclovir (VALTREX) 1000 MG tablet, Take 1,000 mg by mouth daily as needed (for herpes flare). , Disp: , Rfl:    vitamin B-12 (CYANOCOBALAMIN) 500 MCG tablet, Take 500 mcg by mouth daily., Disp: , Rfl:   No orders of the defined types were placed in this encounter.  --Continue cardiac medications as reconciled in final medication list. --Return in about 6 months (around 01/03/2022) for Follow up, Atrial flutter, BP. Or sooner if needed. --Continue follow-up with your primary care physician regarding the management of your other chronic comorbid conditions.  Patient's questions and concerns were addressed to his satisfaction. He voices understanding of the instructions provided during  this encounter.   This note was created using a voice recognition software as a result there may be grammatical errors inadvertently enclosed that do not reflect the nature of this encounter. Every attempt is made to correct such errors.  Norman Hudson, [place], [facility]  Pager: [phone] Office: [phone]

## 2021-07-10 DIAGNOSIS — G4733 Obstructive sleep apnea (adult) (pediatric): Secondary | ICD-10-CM | POA: Diagnosis not present

## 2021-08-04 ENCOUNTER — Other Ambulatory Visit: Payer: Self-pay | Admitting: Cardiology

## 2021-08-04 DIAGNOSIS — I10 Essential (primary) hypertension: Secondary | ICD-10-CM | POA: Diagnosis not present

## 2021-08-04 DIAGNOSIS — E039 Hypothyroidism, unspecified: Secondary | ICD-10-CM | POA: Diagnosis not present

## 2021-08-04 DIAGNOSIS — I4891 Unspecified atrial fibrillation: Secondary | ICD-10-CM | POA: Diagnosis not present

## 2021-08-04 DIAGNOSIS — H35033 Hypertensive retinopathy, bilateral: Secondary | ICD-10-CM | POA: Diagnosis not present

## 2021-08-04 DIAGNOSIS — E7800 Pure hypercholesterolemia, unspecified: Secondary | ICD-10-CM | POA: Diagnosis not present

## 2021-08-06 DIAGNOSIS — S76011D Strain of muscle, fascia and tendon of right hip, subsequent encounter: Secondary | ICD-10-CM | POA: Diagnosis not present

## 2021-08-10 DIAGNOSIS — G4733 Obstructive sleep apnea (adult) (pediatric): Secondary | ICD-10-CM | POA: Diagnosis not present

## 2021-08-28 ENCOUNTER — Ambulatory Visit: Payer: Medicare HMO | Admitting: Cardiology

## 2021-08-31 ENCOUNTER — Other Ambulatory Visit: Payer: Self-pay | Admitting: Cardiology

## 2021-08-31 DIAGNOSIS — I10 Essential (primary) hypertension: Secondary | ICD-10-CM

## 2021-08-31 DIAGNOSIS — Z7901 Long term (current) use of anticoagulants: Secondary | ICD-10-CM

## 2021-08-31 DIAGNOSIS — I4892 Unspecified atrial flutter: Secondary | ICD-10-CM

## 2021-08-31 NOTE — Progress Notes (Signed)
Patient called for refill on Eliquis.  Nursing staff has been requested to refill Eliquis.  Given his recent hospitalization and no recent hemoglobin and renal function.  Will check hemoglobin and hematocrit and BMP.  Labs have been placed and released.   Continue follow-up in May 2023.  Rex Kras, [place], [facility]  Pager: [phone] Office: [phone]

## 2021-09-01 ENCOUNTER — Other Ambulatory Visit: Payer: Self-pay | Admitting: Cardiology

## 2021-09-01 ENCOUNTER — Other Ambulatory Visit: Payer: Self-pay

## 2021-09-01 DIAGNOSIS — I4892 Unspecified atrial flutter: Secondary | ICD-10-CM

## 2021-09-01 DIAGNOSIS — Z7901 Long term (current) use of anticoagulants: Secondary | ICD-10-CM

## 2021-09-01 MED ORDER — APIXABAN 5 MG PO TABS: 5.0000 mg | ORAL_TABLET | Freq: Two times a day (BID) | ORAL | 0 refills | Status: DC

## 2021-09-03 DIAGNOSIS — E119 Type 2 diabetes mellitus without complications: Secondary | ICD-10-CM | POA: Diagnosis not present

## 2021-09-03 DIAGNOSIS — H0102A Squamous blepharitis right eye, upper and lower eyelids: Secondary | ICD-10-CM | POA: Diagnosis not present

## 2021-09-03 DIAGNOSIS — H35412 Lattice degeneration of retina, left eye: Secondary | ICD-10-CM | POA: Diagnosis not present

## 2021-09-03 DIAGNOSIS — H25813 Combined forms of age-related cataract, bilateral: Secondary | ICD-10-CM | POA: Diagnosis not present

## 2021-09-04 DIAGNOSIS — E291 Testicular hypofunction: Secondary | ICD-10-CM | POA: Diagnosis not present

## 2021-09-04 DIAGNOSIS — Z7901 Long term (current) use of anticoagulants: Secondary | ICD-10-CM | POA: Diagnosis not present

## 2021-09-04 DIAGNOSIS — I4892 Unspecified atrial flutter: Secondary | ICD-10-CM | POA: Diagnosis not present

## 2021-09-04 DIAGNOSIS — Z87442 Personal history of urinary calculi: Secondary | ICD-10-CM | POA: Diagnosis not present

## 2021-09-05 LAB — BASIC METABOLIC PANEL
BUN/Creatinine Ratio: 18 (ref 10–24)
BUN: 17 mg/dL (ref 8–27)
CO2: 24 mmol/L (ref 20–29)
Calcium: 9.5 mg/dL (ref 8.6–10.2)
Chloride: 103 mmol/L (ref 96–106)
Creatinine, Ser: 0.92 mg/dL (ref 0.76–1.27)
Glucose: 121 mg/dL — ABNORMAL HIGH (ref 70–99)
Potassium: 4.2 mmol/L (ref 3.5–5.2)
Sodium: 140 mmol/L (ref 134–144)
eGFR: 89 mL/min/{1.73_m2} (ref >59)

## 2021-09-05 LAB — HEMOGLOBIN AND HEMATOCRIT, BLOOD
Hematocrit: 41.8 % (ref 37.5–51.0)
Hemoglobin: 14 g/dL (ref 13.0–17.7)

## 2021-09-10 DIAGNOSIS — R7303 Prediabetes: Secondary | ICD-10-CM | POA: Diagnosis not present

## 2021-09-10 DIAGNOSIS — Z0000 Encounter for general adult medical examination without abnormal findings: Secondary | ICD-10-CM | POA: Diagnosis not present

## 2021-09-10 DIAGNOSIS — E7800 Pure hypercholesterolemia, unspecified: Secondary | ICD-10-CM | POA: Diagnosis not present

## 2021-09-10 DIAGNOSIS — I10 Essential (primary) hypertension: Secondary | ICD-10-CM | POA: Diagnosis not present

## 2021-09-10 DIAGNOSIS — E039 Hypothyroidism, unspecified: Secondary | ICD-10-CM | POA: Diagnosis not present

## 2021-09-10 DIAGNOSIS — Z125 Encounter for screening for malignant neoplasm of prostate: Secondary | ICD-10-CM | POA: Diagnosis not present

## 2021-09-10 DIAGNOSIS — G4733 Obstructive sleep apnea (adult) (pediatric): Secondary | ICD-10-CM | POA: Diagnosis not present

## 2021-09-10 DIAGNOSIS — Z1211 Encounter for screening for malignant neoplasm of colon: Secondary | ICD-10-CM | POA: Diagnosis not present

## 2021-09-10 DIAGNOSIS — M1991 Primary osteoarthritis, unspecified site: Secondary | ICD-10-CM | POA: Diagnosis not present

## 2021-09-10 NOTE — Progress Notes (Signed)
Called pt, no answer. Left vm requesting call back?

## 2021-09-10 NOTE — Progress Notes (Signed)
Pt called back and was informed about lab results.

## 2021-09-14 ENCOUNTER — Other Ambulatory Visit: Payer: Self-pay

## 2021-09-14 DIAGNOSIS — S76011D Strain of muscle, fascia and tendon of right hip, subsequent encounter: Secondary | ICD-10-CM | POA: Diagnosis not present

## 2021-09-16 DIAGNOSIS — Z1211 Encounter for screening for malignant neoplasm of colon: Secondary | ICD-10-CM | POA: Diagnosis not present

## 2021-09-23 ENCOUNTER — Ambulatory Visit: Payer: Medicare HMO | Admitting: Adult Health

## 2021-09-24 ENCOUNTER — Other Ambulatory Visit: Payer: Self-pay | Admitting: Cardiology

## 2021-09-24 DIAGNOSIS — Z7901 Long term (current) use of anticoagulants: Secondary | ICD-10-CM

## 2021-09-24 DIAGNOSIS — I4892 Unspecified atrial flutter: Secondary | ICD-10-CM

## 2021-10-08 DIAGNOSIS — G4733 Obstructive sleep apnea (adult) (pediatric): Secondary | ICD-10-CM | POA: Diagnosis not present

## 2021-10-21 ENCOUNTER — Other Ambulatory Visit: Payer: Self-pay | Admitting: Cardiology

## 2021-10-21 DIAGNOSIS — Z7901 Long term (current) use of anticoagulants: Secondary | ICD-10-CM

## 2021-10-21 DIAGNOSIS — I4892 Unspecified atrial flutter: Secondary | ICD-10-CM

## 2021-11-08 DIAGNOSIS — G4733 Obstructive sleep apnea (adult) (pediatric): Secondary | ICD-10-CM | POA: Diagnosis not present

## 2021-11-11 DIAGNOSIS — E039 Hypothyroidism, unspecified: Secondary | ICD-10-CM | POA: Diagnosis not present

## 2021-11-12 ENCOUNTER — Ambulatory Visit: Payer: Medicare HMO | Admitting: Adult Health

## 2021-11-12 ENCOUNTER — Encounter: Payer: Self-pay | Admitting: Adult Health

## 2021-11-12 VITALS — BP 124/79 | HR 67 | Ht 68.0 in | Wt 200.0 lb

## 2021-11-12 DIAGNOSIS — Z9989 Dependence on other enabling machines and devices: Secondary | ICD-10-CM

## 2021-11-12 DIAGNOSIS — G4733 Obstructive sleep apnea (adult) (pediatric): Secondary | ICD-10-CM | POA: Diagnosis not present

## 2021-11-12 DIAGNOSIS — G4752 REM sleep behavior disorder: Secondary | ICD-10-CM

## 2021-11-12 NOTE — Patient Instructions (Signed)
Your Plan: ? ?Continue current treatment plan - continue use of CPAP and follow up with DME company for any needed supplies or CPAP related concerns  ? ? ? ? ? ?Thank you for coming to see [place] at [facility]. I hope we have been able to provide you high quality care today. ? ?You may receive a patient satisfaction survey over the next few weeks. We would appreciate your feedback and comments so that we may continue to improve ourselves and the health of our patients. ? ?

## 2021-11-12 NOTE — Progress Notes (Signed)
?[facility] ?[id number] ?[place]. [place] [address] ?[phone] ? ?     OFFICE FOLLOW UP NOTE ? ?[name]. ?Date of Birth:  1951-06-19 ?Medical Record Number:  [id number]  ? ? ?Primary neurologist: Dr. Vickey Huger ?Reason for visit: CPAP follow-up ? ? ? ?SUBJECTIVE: ? ? ?CHIEF COMPLAINT:  ?Chief Complaint  ?Patient presents with  ? Obstructive Sleep Apnea  ?  RM 3 alone ?Pt is well, states he has a mask leak occasionally but no other concerns with CPAP   ? ? ?HPI:  ? ?Update 11/12/2021 JM: Patient returns for CPAP compliance visit.  Previously seen by Dr. Vickey Huger 03/18/2021 with download showing great compliance at 87% and residual AHI of 4.1.  Review of past 30-day download as below.  He continues to tolerate CPAP well. Will have occasional leaks -interestingly, reports leaks about every 3rd mask which will need to be replaced after a few days of use.  Continues to follow closely with aero care and up-to-date with supplies.  Occasional use of clonazepam for REM BD with benefit.  Epworth Sleepiness Scale 1/24.  Fatigue severity scale 25/63. ? ? ? ? ? ? ? ? ? ? ? ? ? ? ? ? ?ROS:   ?14 system review of systems performed and negative with exception of those listed in HPI ? ?PMH:  ?Past Medical History:  ?Diagnosis Date  ? Acne   ? Anxiety   ? takes Xanax daily as needed  ? Arthritis   ? Atrial flutter, paroxysmal (HCC)   ? History of kidney stones   ? Hyperlipidemia   ? takes Pravastatin daily  ? Hypertension   ? takes Amlodipine and Valsartan-HCTZ daily  ? Hypothyroidism   ? takes Synthroid daily  ? Inguinal hernia   ? left side  ? Internal hemorrhoids   ? Joint pain   ? Kidney stones   ? Pre-diabetes   ? Rosacea   ? TIA (transient ischemic attack) 06/2019  ? ? ?PSH:  ?Past Surgical History:  ?Procedure Laterality Date  ? COLONOSCOPY    ? EXTRACORPOREAL SHOCK WAVE LITHOTRIPSY Right 12/27/2019  ? Procedure: EXTRACORPOREAL SHOCK WAVE LITHOTRIPSY (ESWL);  Surgeon: Malen Gauze, MD;  Location[facility];  Service: Urology;  Laterality: Right;  ? LITHOTRIPSY  09/2015  ? LUMBAR LAMINECTOMY WITH COFLEX 1 LEVEL N/A 07/07/2015  ? Procedure: LUMBAR FOUR-FIVE LUMBAR LAMINECTOMY WITH COFLEX;  Surgeon: Tia Alert, MD;  Location: [facility];  Service: Neurosurgery;  Laterality: N/A;  ? TONSILLECTOMY    ? 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  ? Procedure: TOTAL HIP ARTHROPLASTY;  Surgeon: Frederico Hamman, MD;  Location: [facility] OR;  Service: Orthopedics;  Laterality: Left;  ? TOTAL HIP ARTHROPLASTY Right 04/24/2021  ? Procedure: TOTAL HIP ARTHROPLASTY;  Surgeon: Frederico Hamman, MD;  Location: [facility];  Service: Orthopedics;  Laterality: Right;  ? ? ?Social History:  ?Social History  ? ?Socioeconomic History  ? Marital status: Married  ?  Spouse name: Not on file  ? Number of children: 2  ? Years of education: Not on file  ? Highest education level: Bachelor's degree (e.g., BA, AB, BS)  ?Occupational History  ? Not on file  ?Tobacco Use  ? Smoking status: Former  ?  Packs/day: 1.00  ?  Years: 15.00  ?  Pack years: 15.00  ?  Types: Cigarettes  ?  Quit date: 02/12/1989  ?  Years since quitting: 32.7  ? Smokeless tobacco: Never  ?  Tobacco comments:  ?  quit smoking in 1991  ?Vaping Use  ? Vaping Use: Never used  ?Substance and Sexual Activity  ? Alcohol use: No  ? Drug use: No  ? Sexual activity: Not on file  ?Other Topics Concern  ? Not on file  ?Social History Narrative  ? Lives at home with wife  ? 4 cups of caffeine/day  ? ?Social Determinants of Health  ? ?Financial Resource Strain: Not on file  ?Food Insecurity: Not on file  ?Transportation Needs: Not on file  ?Physical Activity: Not on file  ?Stress: Not on file  ?Social Connections: Not on file  ?Intimate Partner Violence: Not on file  ? ? ?Family History:  ?Family History  ?Problem Relation Age of Onset  ? Hypertension Father   ? Sleep disorder Father   ? CVA Father   ? Cancer Maternal Grandfather   ? Hypertension Paternal  Grandmother   ? Diabetes Paternal Grandmother   ? Alcoholism Mother   ? Hypertension Sister   ? Aneurysm Sister   ? Atrial fibrillation Sister   ? Other Neg Hx   ?     hypogonadism  ? Sleep apnea Neg Hx   ? ? ?Medications:   ?Current Outpatient Medications on File Prior to Visit  ?Medication Sig Dispense Refill  ? acetaminophen (TYLENOL) 325 MG tablet Take 2 tablets (650 mg total) by mouth every 4 (four) hours as needed. 100 tablet 2  ? cholecalciferol (VITAMIN D3) 25 MCG (1000 UT) tablet Take 1,000 Units by mouth daily.    ? clonazePAM (KLONOPIN) 0.5 MG tablet TAKE 0.5- 1 TABLET AT BEDTIME AS NEEDED 90 tablet 1  ? ELIQUIS 5 MG TABS tablet Take 1 tablet (5 mg total) by mouth 2 (two) times daily. 60 tablet 0  ? hydrochlorothiazide (HYDRODIURIL) 25 MG tablet Take 1 tablet (25 mg total) by mouth every morning. 90 tablet 0  ? levothyroxine (SYNTHROID) 88 MCG tablet Take 88 mcg by mouth daily before breakfast.    ? metFORMIN (GLUCOPHAGE) 500 MG tablet Take 500 mg by mouth daily with breakfast.    ? metoprolol succinate (TOPROL-XL) 25 MG 24 hr tablet Take 1 tablet (25 mg total) by mouth every morning. 90 tablet 0  ? Multiple Vitamins-Minerals (MULTIVITAMIN WITH MINERALS) tablet Take 1 tablet by mouth daily.    ? olmesartan (BENICAR) 40 MG tablet Take 1 tablet (40 mg total) by mouth daily at 10 pm. 90 tablet 0  ? pyridOXINE (VITAMIN B-6) 100 MG tablet Take 100 mg by mouth daily.    ? rosuvastatin (CRESTOR) 20 MG tablet TAKE ONE TABLET AT BEDTIME. 90 tablet 2  ? valACYclovir (VALTREX) 1000 MG tablet Take 1,000 mg by mouth daily as needed (for herpes flare).     ? vitamin B-12 (CYANOCOBALAMIN) 500 MCG tablet Take 500 mcg by mouth daily.    ? spironolactone (ALDACTONE) 25 MG tablet Take 1 tablet (25 mg total) by mouth daily. 90 tablet 3  ? ?No current facility-administered medications on file prior to visit.  ? ? ?Allergies:   ?Allergies  ?Allergen Reactions  ? Bactrim [Sulfamethoxazole-Trimethoprim]   ?  "threw me for a  loop, hives, itching, extreme fatigue"  ? ? ? ? ?OBJECTIVE: ? ?Physical Exam ? ?Vitals:  ? 11/12/21 1501  ?BP: 124/79  ?Pulse: 67  ?Weight: 200 lb (90.7 kg)  ?Height: 5\' 8"  (1.727 m)  ? ?Body mass index is 30.41 kg/m?Marland Kitchen ?No results found. ? ?General: well developed, well nourished, very pleasant  elderly Caucasian male, seated, in no evident distress ?Head: head normocephalic and atraumatic.   ?Neck: supple with no carotid or supraclavicular bruits ?Cardiovascular: regular rate and rhythm, no murmurs ?Musculoskeletal: no deformity ?Skin:  no rash/petichiae ?Vascular:  Normal pulses all extremities ?  ?Neurologic Exam ?Mental Status: Awake and fully alert.  Fluent speech and language.  Oriented to place and time. Recent and remote memory intact. Attention span, concentration and fund of knowledge appropriate. Mood and affect appropriate.  ?Cranial Nerves:  Pupils equal, briskly reactive to light. Extraocular movements full without nystagmus. Visual fields full to confrontation. Hearing intact. Facial sensation intact. Face, tongue, palate moves normally and symmetrically.  ?Motor: Normal bulk and tone. Normal strength in all tested extremity muscles ?Sensory.: intact to touch , pinprick , position and vibratory sensation.  ?Coordination: Rapid alternating movements normal in all extremities. Finger-to-nose and heel-to-shin performed accurately bilaterally. ?Gait and Station: Arises from chair without difficulty. Stance is normal. Gait demonstrates normal stride length and balance without use of AD. Tandem walk and heel toe without difficulty.  ?Reflexes: 1+ and symmetric. Toes downgoing.  ? ? ? ? ? ? ? ?ASSESSMENT: Norman Hudson. is a 71 y.o. year old male with history of OSA on CPAP, and REM BD ? ? ? ? ?PLAN: ? ?OSA on CPAP : Compliance at 80% with optimal residual AHI.  Discussed importance of continue nightly CPAP use >4 hour duration for optimal benefit and per insurance requirements as well as  lowering risk of recurrent TIA and cardiovascular risk factors.  Continue current settings and continued follow-up with DME company for any needed supplies or CPAP related concerns.  ?REM BD: Continue clonazepam 0.5

## 2021-11-25 ENCOUNTER — Other Ambulatory Visit: Payer: Self-pay | Admitting: Cardiology

## 2021-11-25 DIAGNOSIS — Z7901 Long term (current) use of anticoagulants: Secondary | ICD-10-CM

## 2021-11-25 DIAGNOSIS — I4892 Unspecified atrial flutter: Secondary | ICD-10-CM

## 2021-11-27 ENCOUNTER — Other Ambulatory Visit: Payer: Self-pay | Admitting: Cardiology

## 2021-11-27 DIAGNOSIS — I10 Essential (primary) hypertension: Secondary | ICD-10-CM

## 2021-11-27 DIAGNOSIS — I4892 Unspecified atrial flutter: Secondary | ICD-10-CM

## 2021-12-08 DIAGNOSIS — G4733 Obstructive sleep apnea (adult) (pediatric): Secondary | ICD-10-CM | POA: Diagnosis not present

## 2021-12-16 DIAGNOSIS — E7800 Pure hypercholesterolemia, unspecified: Secondary | ICD-10-CM | POA: Diagnosis not present

## 2021-12-16 DIAGNOSIS — I4891 Unspecified atrial fibrillation: Secondary | ICD-10-CM | POA: Diagnosis not present

## 2021-12-16 DIAGNOSIS — I10 Essential (primary) hypertension: Secondary | ICD-10-CM | POA: Diagnosis not present

## 2021-12-16 DIAGNOSIS — M1991 Primary osteoarthritis, unspecified site: Secondary | ICD-10-CM | POA: Diagnosis not present

## 2021-12-16 DIAGNOSIS — E039 Hypothyroidism, unspecified: Secondary | ICD-10-CM | POA: Diagnosis not present

## 2021-12-24 ENCOUNTER — Ambulatory Visit: Payer: Medicare HMO | Admitting: Cardiology

## 2021-12-24 ENCOUNTER — Encounter: Payer: Self-pay | Admitting: Cardiology

## 2021-12-24 ENCOUNTER — Other Ambulatory Visit: Payer: Self-pay | Admitting: Cardiology

## 2021-12-24 VITALS — BP 142/76 | HR 68 | Temp 98.1°F | Resp 16 | Ht 68.0 in | Wt 202.0 lb

## 2021-12-24 DIAGNOSIS — I4892 Unspecified atrial flutter: Secondary | ICD-10-CM

## 2021-12-24 DIAGNOSIS — Z9989 Dependence on other enabling machines and devices: Secondary | ICD-10-CM | POA: Diagnosis not present

## 2021-12-24 DIAGNOSIS — Z87891 Personal history of nicotine dependence: Secondary | ICD-10-CM

## 2021-12-24 DIAGNOSIS — I10 Essential (primary) hypertension: Secondary | ICD-10-CM

## 2021-12-24 DIAGNOSIS — G459 Transient cerebral ischemic attack, unspecified: Secondary | ICD-10-CM | POA: Diagnosis not present

## 2021-12-24 DIAGNOSIS — E7800 Pure hypercholesterolemia, unspecified: Secondary | ICD-10-CM

## 2021-12-24 DIAGNOSIS — Z7901 Long term (current) use of anticoagulants: Secondary | ICD-10-CM

## 2021-12-24 DIAGNOSIS — G4733 Obstructive sleep apnea (adult) (pediatric): Secondary | ICD-10-CM | POA: Diagnosis not present

## 2021-12-24 NOTE — Progress Notes (Signed)
Norman Hudson Norman Hudson. Date of Birth: 18-Oct-1950 MRN: [id number] Primary Care Provider:Timberlake, Meridee Score, MD Former Cardiology Providers: Altamese Coronaca, APRN, FNP-C Primary Cardiologist: Tessa Lerner, DO, [facility] (established care 02/21/2020)  Date: 12/24/21 Last Office Visit: 07/06/2021  Chief Complaint  Patient presents with   Atrial Flutter   Follow-up    HPI  Norman Hudson. is a 71 y.o.  male whose past medical history and cardiovascular risk factors include: Paroxysmal atrial flutter (04/2021), prediabetes, hypertension, hyperlipidemia, chronically low testosterone, hypothyroidism, former smoker with 15 pack year history, anxiety, osteoarthritis, history of TIA, advanced age.  Patient presents today for 37-month follow-up visit for management of paroxysmal atrial fibrillation.  Patient underwent hip replacement and post surgery was noted to be in a flutter with rapid ventricular rate.  Given his CHA2DS2-VASc score he was started on oral anticoagulation for thromboembolic prophylaxis and AV nodal blocking agents for rate control.  Patient does not endorse evidence of bleeding.  Postsurgery his antihypertensive medications were withheld due to hypotension.  They have been restarted in a stepwise fashion and he is doing well from a cardiovascular standpoint.  Patient states that his home blood pressures are usually 125 mmHg.  He follows up with PCP at least twice a year and has his hemoglobin and hematocrit checked every 6 months according to him.   FUNCTIONAL STATUS: No structured exercise program or daily routine.    ALLERGIES: Allergies  Allergen Reactions   Bactrim [Sulfamethoxazole-Trimethoprim]     "threw me for a loop, hives, itching, extreme fatigue"    MEDICATION LIST PRIOR TO VISIT: Current Outpatient Medications on File Prior to Visit  Medication Sig Dispense Refill   acetaminophen (TYLENOL) 325 MG tablet Take 2 tablets (650 mg total) by mouth every 4  (four) hours as needed. 100 tablet 2   cholecalciferol (VITAMIN D3) 25 MCG (1000 UT) tablet Take 1,000 Units by mouth daily.     clonazePAM (KLONOPIN) 0.5 MG tablet TAKE 0.5- 1 TABLET AT BEDTIME AS NEEDED 90 tablet 1   hydrochlorothiazide (HYDRODIURIL) 25 MG tablet Take 1 tablet (25 mg total) by mouth every morning. 90 tablet 0   levothyroxine (SYNTHROID) 100 MCG tablet Take 88 mcg by mouth daily before breakfast.     metFORMIN (GLUCOPHAGE) 500 MG tablet Take 500 mg by mouth daily with breakfast.     metoprolol succinate (TOPROL-XL) 25 MG 24 hr tablet Take 1 tablet (25 mg total) by mouth every morning. 90 tablet 0   Multiple Vitamins-Minerals (MULTIVITAMIN WITH MINERALS) tablet Take 1 tablet by mouth daily.     olmesartan (BENICAR) 40 MG tablet Take 1 tablet (40 mg total) by mouth daily at 10 pm. 90 tablet 0   pyridOXINE (VITAMIN B-6) 100 MG tablet Take 100 mg by mouth daily.     rosuvastatin (CRESTOR) 20 MG tablet TAKE ONE TABLET AT BEDTIME. 90 tablet 2   spironolactone (ALDACTONE) 25 MG tablet Take 1 tablet (25 mg total) by mouth daily. 90 tablet 3   valACYclovir (VALTREX) 1000 MG tablet Take 1,000 mg by mouth daily as needed (for herpes flare).      vitamin B-12 (CYANOCOBALAMIN) 500 MCG tablet Take 500 mcg by mouth daily.     No current facility-administered medications on file prior to visit.    PAST MEDICAL HISTORY: Past Medical History:  Diagnosis Date   Acne    Anxiety    takes Xanax daily as needed   Arthritis    Atrial flutter, paroxysmal (HCC)  History of kidney stones    Hyperlipidemia    takes Pravastatin daily   Hypertension    takes Amlodipine and Valsartan-HCTZ daily   Hypothyroidism    takes Synthroid daily   Inguinal hernia    left side   Internal hemorrhoids    Joint pain    Kidney stones    Pre-diabetes    Rosacea    TIA (transient ischemic attack) 06/2019    PAST SURGICAL HISTORY: Past Surgical History:  Procedure Laterality Date   COLONOSCOPY      EXTRACORPOREAL SHOCK WAVE LITHOTRIPSY Right 12/27/2019   Procedure: EXTRACORPOREAL SHOCK WAVE LITHOTRIPSY (ESWL);  Surgeon: Malen Gauze, MD;  Location[facility];  Service: Urology;  Laterality: Right;   LITHOTRIPSY  09/2015   LUMBAR LAMINECTOMY WITH COFLEX 1 LEVEL N/A 07/07/2015   Procedure: LUMBAR FOUR-FIVE LUMBAR LAMINECTOMY WITH COFLEX;  Surgeon: Tia Alert, MD;  Location: [facility];  Service: Neurosurgery;  Laterality: N/A;   TONSILLECTOMY     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   Procedure: TOTAL HIP ARTHROPLASTY;  Surgeon: Frederico Hamman, MD;  Location: [facility];  Service: Orthopedics;  Laterality: Left;   TOTAL HIP ARTHROPLASTY Right 04/24/2021   Procedure: TOTAL HIP ARTHROPLASTY;  Surgeon: Frederico Hamman, MD;  Location: [facility];  Service: Orthopedics;  Laterality: Right;    FAMILY HISTORY: The patient's family history includes Alcoholism in his mother; Aneurysm in his sister; Atrial fibrillation in his sister; CVA in his father; Cancer in his maternal grandfather; Diabetes in his paternal grandmother; Hypertension in his father, paternal grandmother, and sister; Sleep disorder in his father.   SOCIAL HISTORY:  The patient  reports that he quit smoking about 32 years ago. His smoking use included cigarettes. He has a 15.00 pack-year smoking history. He has never used smokeless tobacco. He reports that he does not drink alcohol and does not use drugs.  Review of Systems  Constitutional: Negative for chills and fever.  HENT:  Negative for hoarse voice and nosebleeds.   Eyes:  Negative for discharge, double vision and pain.  Cardiovascular:  Negative for chest pain, claudication, dyspnea on exertion, leg swelling, near-syncope, orthopnea, palpitations, paroxysmal nocturnal dyspnea and syncope.  Respiratory:  Negative for hemoptysis and shortness of breath.   Musculoskeletal:  Negative for muscle cramps and myalgias.  Gastrointestinal:  Negative for abdominal  pain, constipation, diarrhea, hematemesis, hematochezia, melena, nausea and vomiting.  Neurological:  Negative for dizziness and light-headedness.   PHYSICAL EXAM:    12/24/2021    2:49 PM 12/24/2021    2:46 PM 11/12/2021    3:01 PM  Vitals with BMI  Height  5\' 8"  5\' 8"   Weight  202 lbs 200 lbs  BMI  30.72 30.42  Systolic 142 146 564  Diastolic 76 62 79  Pulse 68 67 67    CONSTITUTIONAL: Well-developed and well-nourished. No acute distress.  SKIN: Skin is warm and dry. No rash noted. No cyanosis. No pallor. No jaundice HEAD: Normocephalic and atraumatic.  EYES: No scleral icterus MOUTH/THROAT: Moist oral membranes.  NECK: No JVD present. No thyromegaly noted. No carotid bruits  LYMPHATIC: No visible cervical adenopathy.  CHEST Normal respiratory effort. No intercostal retractions  LUNGS: Clear to auscultation bilaterally.  No stridor. No wheezes. No rales.  CARDIOVASCULAR: Regular rate and rhythm, positive S1-S2, no murmurs rubs or gallops appreciated. ABDOMINAL: Soft, nontender, nondistended, positive bowel sounds in all 4 quadrants.  No apparent ascites.  EXTREMITIES: No peripheral edema. HEMATOLOGIC: No significant bruising  NEUROLOGIC: Oriented to person, place, and time. Nonfocal. Normal muscle tone.  PSYCHIATRIC: Normal mood and affect. Normal behavior. Cooperative  CARDIAC DATABASE: EKG: 12/24/2021: Normal sinus rhythm, 61 bpm, normal axis, without underlying ischemia or injury pattern.  Echocardiogram: 04/29/2021:  1. Left ventricular ejection fraction, by estimation, is 60 to 65%. The left ventricle has normal function. The left ventricle has no regional wall motion abnormalities. Left ventricular diastolic function could not be evaluated due to atrial flutter.   2. Right ventricular systolic function is normal. The right ventricular size is normal. There is normal pulmonary artery systolic pressure.   3. The mitral valve is normal in structure. No evidence of mitral  valve regurgitation. No evidence of mitral stenosis.   4. The aortic valve is tricuspid. Aortic valve regurgitation is not visualized. No aortic stenosis is present.   Stress Testing:  Exercise myoview stress test  04/23/2019: Patient exercised for a total of 6:13 min., achieving approximately 7 METs. Normal BP response. Exercise was terminated due to fatigue/weakness and achieving THR. Normal myocardial perfusion. All segments of left ventricle demonstrated normal wall motion and thickening. Stress LV EF is hyperdynamic 75%. Low risk study.    Event Monitor for 30 days Start date 07/11/2019: There were no symptomatic triggered events. Predominant rhythm is normal sinus rhythm. Rare PACs and PVCs. No atrial fibrillation.   21 day event monitor 07/07-07/27/2020: NSR with occasional PAC/PVC. 1 patient triggered event without reported symptoms correlated with NSR with PAC and PVC. 2 auto detected events occurred for NSR with 5 beats of NSVT a t 173 bpm on day 1 at 1453 and NSR with 6 beats of NSVT on day 12 at 1651. Both were asymptomatic. 1 auto detected event on day 14 for Sinus tachycardia with frequent PAC's. No A fib was noted.  LABORATORY DATA:    Latest Ref Rng & Units 09/04/2021    2:48 PM 04/30/2021    6:05 AM 04/29/2021    8:47 AM  CBC  WBC 4.0 - 10.5 K/uL  7.8   6.3    Hemoglobin 13.0 - 17.7 g/dL 65.7   84.6   96.2    Hematocrit 37.5 - 51.0 % 41.8   31.8   32.1    Platelets 150 - 400 K/uL  170   159         Latest Ref Rng & Units 09/04/2021    2:48 PM 06/11/2021    2:42 PM 05/29/2021    1:48 PM  CMP  Glucose 70 - 99 mg/dL 952   841   324    BUN 8 - 27 mg/dL 17   14   10     Creatinine 0.76 - 1.27 mg/dL 4.01   0.27   2.53    Sodium 134 - 144 mmol/L 140   143   144    Potassium 3.5 - 5.2 mmol/L 4.2   4.3   4.5    Chloride 96 - 106 mmol/L 103   104   103    CO2 20 - 29 mmol/L 24   22   25     Calcium 8.6 - 10.2 mg/dL 9.5   9.9   9.6      Lipid Panel     Component Value  Date/Time   CHOL 139 07/12/2019 1026   TRIG 115 07/12/2019 1026   HDL 46 07/12/2019 1026   CHOLHDL 3.0 07/12/2019 1026   LDLCALC 72 07/12/2019 1026   LABVLDL 21 07/12/2019 1026  Lab Results  Component Value Date   HGBA1C 6.0 (H) 04/16/2021   HGBA1C 5.8 (H) 07/12/2016   HGBA1C 6.1 (H) 06/27/2015   No components found for: NTPROBNP Lab Results  Component Value Date   TSH 3.918 04/30/2021    Cardiac Panel (last 3 results) No results for input(s): CKTOTAL, CKMB, TROPONINIHS, RELINDX in the last 72 hours.  External Labs: Collected: 05/14/2021 provided by PCP Hemoglobin 10.6 g/dL, hematocrit 41.3%. Sodium 140, potassium 4.4, chloride 105, bicarb 27, BUN 14, creatinine 0.87 AST 18, ALT 22, alkaline phosphatase 67  Name Result Date Reference Range  TSH   2021-11-11    TSH 0.98   0.34-4.50  FOBT FIT (244010)   2021-09-16    Occult Blood, Fecal, IA Negative   Negative  Thyroid Diagnostic Cascade   2021-09-10    TSH 0.33   0.34-4.50  Hemoglobin A1c   2021-09-10    eAG 140      Hgb A1c 6.5   4.8-5.6  PSA   2021-09-10    PSA 2.21   0.01-4.00  Free T4   2021-09-10    FT4 0.89   0.61-1.12  Lipid Panel w/reflex   2021-09-10    LDL Chol Calc (NIH) 44   0-99  CHOL/HDL 2.6   2.0-4.0  Cholesterol 119   <200  HDLD 46   30-70  LDL Chol Calc (NIH) 44   0-99  NHDL 73   0-129  Triglyceride 176   0-199    IMPRESSION:    ICD-10-CM   1. Paroxysmal atrial flutter (HCC)  I48.92 EKG 12-Lead    2. Long term (current) use of anticoagulants  Z79.01     3. Essential hypertension  I10     4. TIA (transient ischemic attack)  G45.9     5. Pure hypercholesterolemia  E78.00     6. OSA on CPAP  G47.33    Z99.89     7. Former smoker  Z87.891        RECOMMENDATIONS: Norman Hudson. is a 71 y.o. male whose past medical history and cardiovascular risk factors include: prediabetes, hypertension, hyperlipidemia, chronically low testosterone, hypothyroidism, former smoker with  15 pack year history, anxiety, osteoarthritis, history of TIA, advanced age.  Paroxysmal atrial flutter (HCC) Rate control: Metoprolol. Rhythm control N/A. Thromboembolic prophylaxis: Eliquis. No prior history of cardioversion for atrial flutter ablation. EKG: Normal sinus rhythm without underlying ischemia or injury pattern.  Long term (current) use of anticoagulants Indication paroxysmal atrial flutter. CHA2DS2-VASc SCORE is 4 which correlates to 4.6% risk of stroke per year (TIA, age, hypertension).  We emphasized the risks, benefits, alternatives to oral anticoagulation. We also discussed left atrial appendage closure device, i.e. watchman to consider as an alternative to long-term oral anticoagulation.  Patient states that he is currently hesitant on any additional procedures or surgeries after his recent hip replacement.  Essential hypertension Office blood pressures are not at goal but home blood pressures are within normal limits. Medications reconciled. Recommend low-salt diet  TIA (transient ischemic attack) Educated on the importance of secondary prevention.  Pure hypercholesterolemia Currently on Crestor.   He denies myalgia or other side effects. Currently managed by primary care provider.  OSA on CPAP Patient is compliant with his CPAP on a daily basis. Follows with Dr. Vickey Huger.  Former smoker Reemphasized importance of complete smoking cessation.  FINAL MEDICATION LIST END OF ENCOUNTER: No orders of the defined types were placed in this encounter.    Current Outpatient Medications:  acetaminophen (TYLENOL) 325 MG tablet, Take 2 tablets (650 mg total) by mouth every 4 (four) hours as needed., Disp: 100 tablet, Rfl: 2   cholecalciferol (VITAMIN D3) 25 MCG (1000 UT) tablet, Take 1,000 Units by mouth daily., Disp: , Rfl:    clonazePAM (KLONOPIN) 0.5 MG tablet, TAKE 0.5- 1 TABLET AT BEDTIME AS NEEDED, Disp: 90 tablet, Rfl: 1   ELIQUIS 5 MG TABS tablet, TAKE  ONE TABLET BY MOUTH TWICE DAILY, Disp: 60 tablet, Rfl: 0   hydrochlorothiazide (HYDRODIURIL) 25 MG tablet, Take 1 tablet (25 mg total) by mouth every morning., Disp: 90 tablet, Rfl: 0   levothyroxine (SYNTHROID) 100 MCG tablet, Take 88 mcg by mouth daily before breakfast., Disp: , Rfl:    metFORMIN (GLUCOPHAGE) 500 MG tablet, Take 500 mg by mouth daily with breakfast., Disp: , Rfl:    metoprolol succinate (TOPROL-XL) 25 MG 24 hr tablet, Take 1 tablet (25 mg total) by mouth every morning., Disp: 90 tablet, Rfl: 0   Multiple Vitamins-Minerals (MULTIVITAMIN WITH MINERALS) tablet, Take 1 tablet by mouth daily., Disp: , Rfl:    olmesartan (BENICAR) 40 MG tablet, Take 1 tablet (40 mg total) by mouth daily at 10 pm., Disp: 90 tablet, Rfl: 0   pyridOXINE (VITAMIN B-6) 100 MG tablet, Take 100 mg by mouth daily., Disp: , Rfl:    rosuvastatin (CRESTOR) 20 MG tablet, TAKE ONE TABLET AT BEDTIME., Disp: 90 tablet, Rfl: 2   spironolactone (ALDACTONE) 25 MG tablet, Take 1 tablet (25 mg total) by mouth daily., Disp: 90 tablet, Rfl: 3   valACYclovir (VALTREX) 1000 MG tablet, Take 1,000 mg by mouth daily as needed (for herpes flare). , Disp: , Rfl:    vitamin B-12 (CYANOCOBALAMIN) 500 MCG tablet, Take 500 mcg by mouth daily., Disp: , Rfl:   Orders Placed This Encounter  Procedures   EKG 12-Lead    --Continue cardiac medications as reconciled in final medication list. --Return in about 6 months (around 06/26/2022) for Follow up, Paroxysmal atrial flutter. Or sooner if needed. --Continue follow-up with your primary care physician regarding the management of your other chronic comorbid conditions.  Patient's questions and concerns were addressed to his satisfaction. He voices understanding of the instructions provided during this encounter.   This note was created using a voice recognition software as a result there may be grammatical errors inadvertently enclosed that do not reflect the nature of this encounter.  Every attempt is made to correct such errors.  Total time spent: 30 minutes.  Tessa Lerner, [place], [facility]  Pager: [phone] Office: [phone]

## 2021-12-28 ENCOUNTER — Ambulatory Visit: Payer: Medicare HMO | Admitting: Cardiology

## 2021-12-29 DIAGNOSIS — D485 Neoplasm of uncertain behavior of skin: Secondary | ICD-10-CM | POA: Diagnosis not present

## 2021-12-29 DIAGNOSIS — L821 Other seborrheic keratosis: Secondary | ICD-10-CM | POA: Diagnosis not present

## 2021-12-29 DIAGNOSIS — D1801 Hemangioma of skin and subcutaneous tissue: Secondary | ICD-10-CM | POA: Diagnosis not present

## 2021-12-29 DIAGNOSIS — L905 Scar conditions and fibrosis of skin: Secondary | ICD-10-CM | POA: Diagnosis not present

## 2022-01-06 DIAGNOSIS — G4733 Obstructive sleep apnea (adult) (pediatric): Secondary | ICD-10-CM | POA: Diagnosis not present

## 2022-01-14 ENCOUNTER — Other Ambulatory Visit: Payer: Self-pay | Admitting: Neurology

## 2022-01-24 ENCOUNTER — Other Ambulatory Visit: Payer: Self-pay | Admitting: Cardiology

## 2022-01-24 DIAGNOSIS — I4892 Unspecified atrial flutter: Secondary | ICD-10-CM

## 2022-01-24 DIAGNOSIS — Z7901 Long term (current) use of anticoagulants: Secondary | ICD-10-CM

## 2022-02-05 DIAGNOSIS — M1991 Primary osteoarthritis, unspecified site: Secondary | ICD-10-CM | POA: Diagnosis not present

## 2022-02-05 DIAGNOSIS — E039 Hypothyroidism, unspecified: Secondary | ICD-10-CM | POA: Diagnosis not present

## 2022-02-05 DIAGNOSIS — I10 Essential (primary) hypertension: Secondary | ICD-10-CM | POA: Diagnosis not present

## 2022-02-05 DIAGNOSIS — E7800 Pure hypercholesterolemia, unspecified: Secondary | ICD-10-CM | POA: Diagnosis not present

## 2022-02-05 DIAGNOSIS — G4733 Obstructive sleep apnea (adult) (pediatric): Secondary | ICD-10-CM | POA: Diagnosis not present

## 2022-02-05 DIAGNOSIS — I4891 Unspecified atrial fibrillation: Secondary | ICD-10-CM | POA: Diagnosis not present

## 2022-02-17 ENCOUNTER — Other Ambulatory Visit: Payer: Self-pay | Admitting: Cardiology

## 2022-02-17 DIAGNOSIS — Z7901 Long term (current) use of anticoagulants: Secondary | ICD-10-CM

## 2022-02-17 DIAGNOSIS — I4892 Unspecified atrial flutter: Secondary | ICD-10-CM

## 2022-02-25 ENCOUNTER — Other Ambulatory Visit: Payer: Self-pay | Admitting: Cardiology

## 2022-02-25 DIAGNOSIS — I10 Essential (primary) hypertension: Secondary | ICD-10-CM

## 2022-02-25 DIAGNOSIS — I4892 Unspecified atrial flutter: Secondary | ICD-10-CM

## 2022-03-08 DIAGNOSIS — G4733 Obstructive sleep apnea (adult) (pediatric): Secondary | ICD-10-CM | POA: Diagnosis not present

## 2022-03-10 DIAGNOSIS — G8929 Other chronic pain: Secondary | ICD-10-CM | POA: Diagnosis not present

## 2022-03-10 DIAGNOSIS — R718 Other abnormality of red blood cells: Secondary | ICD-10-CM | POA: Diagnosis not present

## 2022-03-10 DIAGNOSIS — E1169 Type 2 diabetes mellitus with other specified complication: Secondary | ICD-10-CM | POA: Diagnosis not present

## 2022-03-10 DIAGNOSIS — I700 Atherosclerosis of aorta: Secondary | ICD-10-CM | POA: Diagnosis not present

## 2022-03-22 ENCOUNTER — Other Ambulatory Visit: Payer: Self-pay | Admitting: Cardiology

## 2022-03-22 DIAGNOSIS — Z7901 Long term (current) use of anticoagulants: Secondary | ICD-10-CM

## 2022-03-22 DIAGNOSIS — I4892 Unspecified atrial flutter: Secondary | ICD-10-CM

## 2022-04-06 DIAGNOSIS — G4733 Obstructive sleep apnea (adult) (pediatric): Secondary | ICD-10-CM | POA: Diagnosis not present

## 2022-04-07 ENCOUNTER — Telehealth: Payer: Self-pay

## 2022-04-07 ENCOUNTER — Other Ambulatory Visit: Payer: Self-pay

## 2022-04-07 MED ORDER — SPIRONOLACTONE 25 MG PO TABS: 25.0000 mg | ORAL_TABLET | Freq: Every day | ORAL | 3 refills | Status: DC

## 2022-04-07 NOTE — Telephone Encounter (Signed)
Called the pharmacy. 

## 2022-04-07 NOTE — Telephone Encounter (Signed)
Pt called stating that pharm needs clarification on Aldactone. They have different instructions. Call them and pt plz. He left vm yesterday and has not rec'vd a cb//ah

## 2022-04-23 ENCOUNTER — Other Ambulatory Visit: Payer: Self-pay | Admitting: Cardiology

## 2022-04-23 DIAGNOSIS — Z7901 Long term (current) use of anticoagulants: Secondary | ICD-10-CM

## 2022-04-23 DIAGNOSIS — I4892 Unspecified atrial flutter: Secondary | ICD-10-CM

## 2022-04-28 DIAGNOSIS — Z23 Encounter for immunization: Secondary | ICD-10-CM | POA: Diagnosis not present

## 2022-04-29 ENCOUNTER — Other Ambulatory Visit: Payer: Self-pay | Admitting: Cardiology

## 2022-05-07 DIAGNOSIS — B001 Herpesviral vesicular dermatitis: Secondary | ICD-10-CM | POA: Diagnosis not present

## 2022-05-07 DIAGNOSIS — G4733 Obstructive sleep apnea (adult) (pediatric): Secondary | ICD-10-CM | POA: Diagnosis not present

## 2022-05-07 DIAGNOSIS — J0110 Acute frontal sinusitis, unspecified: Secondary | ICD-10-CM | POA: Diagnosis not present

## 2022-05-20 ENCOUNTER — Other Ambulatory Visit: Payer: Self-pay

## 2022-05-20 MED ORDER — SPIRONOLACTONE 25 MG PO TABS: 25.0000 mg | ORAL_TABLET | Freq: Every day | ORAL | 3 refills | Status: DC

## 2022-05-23 ENCOUNTER — Other Ambulatory Visit: Payer: Self-pay | Admitting: Cardiology

## 2022-05-23 DIAGNOSIS — Z7901 Long term (current) use of anticoagulants: Secondary | ICD-10-CM

## 2022-05-23 DIAGNOSIS — I4892 Unspecified atrial flutter: Secondary | ICD-10-CM

## 2022-05-26 ENCOUNTER — Other Ambulatory Visit: Payer: Self-pay | Admitting: Cardiology

## 2022-05-26 DIAGNOSIS — I4892 Unspecified atrial flutter: Secondary | ICD-10-CM

## 2022-05-26 DIAGNOSIS — I10 Essential (primary) hypertension: Secondary | ICD-10-CM

## 2022-06-06 DIAGNOSIS — G4733 Obstructive sleep apnea (adult) (pediatric): Secondary | ICD-10-CM | POA: Diagnosis not present

## 2022-06-23 ENCOUNTER — Other Ambulatory Visit: Payer: Self-pay | Admitting: Cardiology

## 2022-06-23 DIAGNOSIS — I4892 Unspecified atrial flutter: Secondary | ICD-10-CM

## 2022-06-23 DIAGNOSIS — Z7901 Long term (current) use of anticoagulants: Secondary | ICD-10-CM

## 2022-06-28 ENCOUNTER — Ambulatory Visit: Payer: Medicare HMO | Admitting: Cardiology

## 2022-07-05 DIAGNOSIS — G4733 Obstructive sleep apnea (adult) (pediatric): Secondary | ICD-10-CM | POA: Diagnosis not present

## 2022-07-08 ENCOUNTER — Ambulatory Visit: Payer: Medicare HMO | Admitting: Cardiology

## 2022-07-08 ENCOUNTER — Ambulatory Visit: Payer: Medicare HMO

## 2022-07-08 VITALS — BP 159/84 | HR 72 | Ht 68.0 in | Wt 204.2 lb

## 2022-07-08 DIAGNOSIS — G4733 Obstructive sleep apnea (adult) (pediatric): Secondary | ICD-10-CM

## 2022-07-08 DIAGNOSIS — E039 Hypothyroidism, unspecified: Secondary | ICD-10-CM | POA: Diagnosis not present

## 2022-07-08 DIAGNOSIS — I4892 Unspecified atrial flutter: Secondary | ICD-10-CM | POA: Diagnosis not present

## 2022-07-08 DIAGNOSIS — I10 Essential (primary) hypertension: Secondary | ICD-10-CM | POA: Diagnosis not present

## 2022-07-08 DIAGNOSIS — G8929 Other chronic pain: Secondary | ICD-10-CM | POA: Diagnosis not present

## 2022-07-08 DIAGNOSIS — G459 Transient cerebral ischemic attack, unspecified: Secondary | ICD-10-CM

## 2022-07-08 DIAGNOSIS — E7800 Pure hypercholesterolemia, unspecified: Secondary | ICD-10-CM | POA: Diagnosis not present

## 2022-07-08 DIAGNOSIS — E1169 Type 2 diabetes mellitus with other specified complication: Secondary | ICD-10-CM | POA: Diagnosis not present

## 2022-07-08 DIAGNOSIS — M1991 Primary osteoarthritis, unspecified site: Secondary | ICD-10-CM | POA: Diagnosis not present

## 2022-07-08 DIAGNOSIS — Z7901 Long term (current) use of anticoagulants: Secondary | ICD-10-CM | POA: Diagnosis not present

## 2022-07-08 MED ORDER — HYDRALAZINE HCL 25 MG PO TABS: 25.0000 mg | ORAL_TABLET | Freq: Three times a day (TID) | ORAL | 3 refills | Status: DC | PRN

## 2022-07-08 NOTE — Progress Notes (Signed)
Norman Hudson. Date of Birth: 1950-10-07 MRN: [id number] Primary Care Provider:Timberlake, Meridee Score, MD Former Cardiology Providers: Altamese Lerna, APRN, FNP-C Primary Cardiologist: Tessa Lerner, DO, [facility] (established care 02/21/2020)  Date: 07/08/22 Last Office Visit: 07/06/2021  Chief Complaint  Patient presents with   Atrial Fibrillation   Follow-up    HPI  Norman Hudson. is a 71 y.o.  male whose past medical history and cardiovascular risk factors include: Paroxysmal atrial flutter (04/2021), prediabetes, hypertension, hyperlipidemia, chronically low testosterone, hypothyroidism, former smoker with 15 pack year history, anxiety, osteoarthritis, history of TIA, advanced age.  Patient presents today for 56-month follow-up visit for management of paroxysmal atrial fibrillation.  Patient underwent hip replacement and post surgery was noted to be in a flutter with rapid ventricular rate.  Given his CHA2DS2-VASc score he was started on oral anticoagulation for thromboembolic prophylaxis and AV nodal blocking agents for rate control.  Patient does not endorse evidence of bleeding. Patient states that his home blood pressures are usually 120-140s/70-80.  He follows up with PCP at least twice a year and has his hemoglobin and hematocrit checked every 6 months according to him. Overall, he is doing well without any complaints.  FUNCTIONAL STATUS: No structured exercise program or daily routine.    ALLERGIES: Allergies  Allergen Reactions   Bactrim [Sulfamethoxazole-Trimethoprim]     "threw me for a loop, hives, itching, extreme fatigue"    MEDICATION LIST PRIOR TO VISIT: Current Outpatient Medications on File Prior to Visit  Medication Sig Dispense Refill   acetaminophen (TYLENOL) 325 MG tablet Take 650 mg by mouth every 6 (six) hours as needed.     cholecalciferol (VITAMIN D3) 25 MCG (1000 UT) tablet Take 1,000 Units by mouth daily.     clonazePAM (KLONOPIN) 0.5  MG tablet Take 0.5-1 tablets (0.25-0.5 mg total) by mouth at bedtime as needed for anxiety. 90 tablet 1   ELIQUIS 5 MG TABS tablet TAKE ONE TABLET BY MOUTH TWICE DAILY 60 tablet 0   hydrochlorothiazide (HYDRODIURIL) 25 MG tablet Take 1 tablet (25 mg total) by mouth every morning. 90 tablet 0   metFORMIN (GLUCOPHAGE) 500 MG tablet Take 500 mg by mouth daily with breakfast.     metoprolol succinate (TOPROL-XL) 25 MG 24 hr tablet Take 1 tablet (25 mg total) by mouth every morning. 90 tablet 0   Multiple Vitamins-Minerals (MULTIVITAMIN WITH MINERALS) tablet Take 1 tablet by mouth daily.     olmesartan (BENICAR) 40 MG tablet Take 1 tablet (40 mg total) by mouth daily at 10 pm. 90 tablet 0   pyridOXINE (VITAMIN B-6) 100 MG tablet Take 100 mg by mouth daily.     rosuvastatin (CRESTOR) 20 MG tablet TAKE ONE TABLET AT BEDTIME. 90 tablet 2   spironolactone (ALDACTONE) 25 MG tablet Take 1 tablet (25 mg total) by mouth daily. 90 tablet 3   SYNTHROID 88 MCG tablet Take 88 mcg by mouth every morning.     valACYclovir (VALTREX) 1000 MG tablet Take 1,000 mg by mouth daily as needed (for herpes flare).      No current facility-administered medications on file prior to visit.    PAST MEDICAL HISTORY: Past Medical History:  Diagnosis Date   Acne    Anxiety    takes Xanax daily as needed   Arthritis    Atrial flutter, paroxysmal (HCC)    History of kidney stones    Hyperlipidemia    takes Pravastatin daily   Hypertension    takes  Amlodipine and Valsartan-HCTZ daily   Hypothyroidism    takes Synthroid daily   Inguinal hernia    left side   Internal hemorrhoids    Joint pain    Kidney stones    Pre-diabetes    Rosacea    TIA (transient ischemic attack) 06/2019    PAST SURGICAL HISTORY: Past Surgical History:  Procedure Laterality Date   COLONOSCOPY     EXTRACORPOREAL SHOCK WAVE LITHOTRIPSY Right 12/27/2019   Procedure: EXTRACORPOREAL SHOCK WAVE LITHOTRIPSY (ESWL);  Surgeon: Malen Gauze, MD;  Location: [facility];  Service: Urology;  Laterality: Right;   LITHOTRIPSY  09/2015   LUMBAR LAMINECTOMY WITH COFLEX 1 LEVEL N/A 07/07/2015   Procedure: LUMBAR FOUR-FIVE LUMBAR LAMINECTOMY WITH COFLEX;  Surgeon: Tia Alert, MD;  Location: [facility];  Service: Neurosurgery;  Laterality: N/A;   TONSILLECTOMY     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   Procedure: TOTAL HIP ARTHROPLASTY;  Surgeon: Frederico Hamman, MD;  Location: [facility];  Service: Orthopedics;  Laterality: Left;   TOTAL HIP ARTHROPLASTY Right 04/24/2021   Procedure: TOTAL HIP ARTHROPLASTY;  Surgeon: Frederico Hamman, MD;  Location: [facility];  Service: Orthopedics;  Laterality: Right;    FAMILY HISTORY: The patient's family history includes Alcoholism in his mother; Aneurysm in his sister; Atrial fibrillation in his sister; CVA in his father; Cancer in his maternal grandfather; Diabetes in his paternal grandmother; Hypertension in his father, paternal grandmother, and sister; Sleep disorder in his father.   SOCIAL HISTORY:  The patient  reports that he quit smoking about 33 years ago. His smoking use included cigarettes. He has a 15.00 pack-year smoking history. He has never used smokeless tobacco. He reports that he does not drink alcohol and does not use drugs.  Review of Systems  Cardiovascular:  Negative for chest pain, claudication, dyspnea on exertion, irregular heartbeat, leg swelling, near-syncope, palpitations and syncope.   PHYSICAL EXAM:    07/08/2022   12:57 PM 07/08/2022   12:48 PM 12/24/2021    2:49 PM  Vitals with BMI  Height  5\' 8"    Weight  204 lbs 3 oz   BMI  31.06   Systolic 159 173 161  Diastolic 84 89 76  Pulse 72 69 68   Physical Exam Cardiovascular:     Rate and Rhythm: Normal rate and regular rhythm.     Pulses: Normal pulses.     Heart sounds: Normal heart sounds. No murmur heard.    No gallop.  Pulmonary:     Effort: Pulmonary effort is normal. No respiratory  distress.     Breath sounds: Normal breath sounds. No wheezing or rales.  Musculoskeletal:     Right lower leg: No edema.     Left lower leg: No edema.  Neurological:     Mental Status: He is alert.    CARDIAC DATABASE: EKG: EKG 07/09/2019: Normal sinus rhythm at rate of 67 bpm.  Normal axis.  No evidence of ischemia or underlying injury pattern.  Compared to previous EKG on 12/24/2021, no significant change.  Echocardiogram: 04/29/2021:  1. Left ventricular ejection fraction, by estimation, is 60 to 65%. The left ventricle has normal function. The left ventricle has no regional wall motion abnormalities. Left ventricular diastolic function could not be evaluated due to atrial flutter.   2. Right ventricular systolic function is normal. The right ventricular size is normal. There is normal pulmonary artery systolic pressure.   3. The mitral valve is normal in  structure. No evidence of mitral valve regurgitation. No evidence of mitral stenosis.   4. The aortic valve is tricuspid. Aortic valve regurgitation is not visualized. No aortic stenosis is present.   Stress Testing:  Exercise myoview stress test  04/23/2019: Patient exercised for a total of 6:13 min., achieving approximately 7 METs. Normal BP response. Exercise was terminated due to fatigue/weakness and achieving THR. Normal myocardial perfusion. All segments of left ventricle demonstrated normal wall motion and thickening. Stress LV EF is hyperdynamic 75%. Low risk study.    Event Monitor for 30 days Start date 07/11/2019: There were no symptomatic triggered events. Predominant rhythm is normal sinus rhythm. Rare PACs and PVCs. No atrial fibrillation.   21 day event monitor 07/07-07/27/2020: NSR with occasional PAC/PVC. 1 patient triggered event without reported symptoms correlated with NSR with PAC and PVC. 2 auto detected events occurred for NSR with 5 beats of NSVT a t 173 bpm on day 1 at 1453 and NSR with 6 beats of NSVT on day  12 at 1651. Both were asymptomatic. 1 auto detected event on day 14 for Sinus tachycardia with frequent PAC's. No A fib was noted.  LABORATORY DATA:    Latest Ref Rng & Units 09/04/2021    2:48 PM 04/30/2021    6:05 AM 04/29/2021    8:47 AM  CBC  WBC 4.0 - 10.5 K/uL  7.8  6.3   Hemoglobin 13.0 - 17.7 g/dL 78.4  69.6  29.5   Hematocrit 37.5 - 51.0 % 41.8  31.8  32.1   Platelets 150 - 400 K/uL  170  159        Latest Ref Rng & Units 09/04/2021    2:48 PM 06/11/2021    2:42 PM 05/29/2021    1:48 PM  CMP  Glucose 70 - 99 mg/dL 284  132  440   BUN 8 - 27 mg/dL 17  14  10    Creatinine 0.76 - 1.27 mg/dL 1.02  7.25  3.66   Sodium 134 - 144 mmol/L 140  143  144   Potassium 3.5 - 5.2 mmol/L 4.2  4.3  4.5   Chloride 96 - 106 mmol/L 103  104  103   CO2 20 - 29 mmol/L 24  22  25    Calcium 8.6 - 10.2 mg/dL 9.5  9.9  9.6     Lipid Panel     Component Value Date/Time   CHOL 139 07/12/2019 1026   TRIG 115 07/12/2019 1026   HDL 46 07/12/2019 1026   CHOLHDL 3.0 07/12/2019 1026   LDLCALC 72 07/12/2019 1026   LABVLDL 21 07/12/2019 1026    Lab Results  Component Value Date   HGBA1C 6.0 (H) 04/16/2021   HGBA1C 5.8 (H) 07/12/2016   HGBA1C 6.1 (H) 06/27/2015   No components found for: "NTPROBNP" Lab Results  Component Value Date   TSH 3.918 04/30/2021   External Labs: Collected: 05/14/2021 provided by PCP Hemoglobin 10.6 g/dL, hematocrit 44.0%. Sodium 140, potassium 4.4, chloride 105, bicarb 27, BUN 14, creatinine 0.87 AST 18, ALT 22, alkaline phosphatase 67  Name Result Date Reference Range  TSH   2021-11-11    TSH 0.98   0.34-4.50  FOBT FIT (347425)   2021-09-16    Occult Blood, Fecal, IA Negative   Negative  Thyroid Diagnostic Cascade   2021-09-10    TSH 0.33   0.34-4.50  Hemoglobin A1c   2021-09-10    eAG 140      Hgb A1c 6.5  4.8-5.6  PSA   2021-09-10    PSA 2.21   0.01-4.00  Free T4   2021-09-10    FT4 0.89   0.61-1.12  Lipid Panel w/reflex   2021-09-10    LDL Chol  Calc (NIH) 44   1-61  CHOL/HDL 2.6   2.0-4.0  Cholesterol 119   <200  HDLD 46   30-70  LDL Chol Calc (NIH) 44   0-99  NHDL 73   0-129  Triglyceride 176   0-199    IMPRESSION:    ICD-10-CM   1. Paroxysmal atrial flutter (HCC)  I48.92 EKG 12-Lead    2. Long term (current) use of anticoagulants  Z79.01     3. Essential hypertension  I10     4. TIA (transient ischemic attack)  G45.9     5. Pure hypercholesterolemia  E78.00     6. OSA on CPAP  G47.33        RECOMMENDATIONS: Norman Votava. is a 71 y.o. male whose past medical history and cardiovascular risk factors include: prediabetes, hypertension, hyperlipidemia, chronically low testosterone, hypothyroidism, former smoker with 15 pack year history, anxiety, osteoarthritis, history of TIA, advanced age.  Paroxysmal atrial flutter (HCC) Rate control: Metoprolol. Rhythm control N/A. Thromboembolic prophylaxis: Eliquis. No prior history of cardioversion for atrial flutter ablation. EKG: Normal sinus rhythm without underlying ischemia or injury pattern.  Long term (current) use of anticoagulants Indication paroxysmal atrial flutter. CHA2DS2-VASc SCORE is 4 which correlates to 4.6% risk of stroke per year (TIA, age, hypertension).  We emphasized the risks, benefits, alternatives to oral anticoagulation. Previously discussed left atrial appendage closure device, i.e. watchman to consider as an alternative to long-term oral anticoagulation.  Patient is hesitant on any additional procedures or surgeries after his recent hip replacement.  Essential hypertension Office blood pressures are not at goal but home blood pressures and home blood pressure is 130-140s/80 at times and at times 120s/70s.  He is concerned about occasional elevated SPB of 140s. Will not add daily antihypertensive agent at this time given history of hypotension. Will add hydralazine 25mg  TID as needed for blood pressure >150/90. Advised patient to  keep log of home blood pressure readings and notify office if readings are consistently elevated or he has frequent use of hydralazine. Medications reconciled. Recommend low-salt diet  TIA (transient ischemic attack) Educated on the importance of secondary prevention.  Pure hypercholesterolemia Currently on Crestor. He denies myalgia or other side effects. Currently managed by primary care provider.  OSA on CPAP Patient is compliant with his CPAP on a daily basis. Follows with Dr. Vickey Huger.  Former smoker Reemphasized importance of complete smoking cessation.  FINAL MEDICATION LIST END OF ENCOUNTER: Meds ordered this encounter  Medications   hydrALAZINE (APRESOLINE) 25 MG tablet    Sig: Take 1 tablet (25 mg total) by mouth 3 (three) times daily as needed. Take as needed for blood pressure greater than 150/90    Dispense:  90 tablet    Refill:  3    Order Specific Question:   Supervising Provider    Answer:   Erenest Rasher     Current Outpatient Medications:    acetaminophen (TYLENOL) 325 MG tablet, Take 650 mg by mouth every 6 (six) hours as needed., Disp: , Rfl:    cholecalciferol (VITAMIN D3) 25 MCG (1000 UT) tablet, Take 1,000 Units by mouth daily., Disp: , Rfl:    clonazePAM (KLONOPIN) 0.5 MG tablet, Take 0.5-1 tablets (0.25-0.5 mg total) by mouth at  bedtime as needed for anxiety., Disp: 90 tablet, Rfl: 1   ELIQUIS 5 MG TABS tablet, TAKE ONE TABLET BY MOUTH TWICE DAILY, Disp: 60 tablet, Rfl: 0   hydrALAZINE (APRESOLINE) 25 MG tablet, Take 1 tablet (25 mg total) by mouth 3 (three) times daily as needed. Take as needed for blood pressure greater than 150/90, Disp: 90 tablet, Rfl: 3   hydrochlorothiazide (HYDRODIURIL) 25 MG tablet, Take 1 tablet (25 mg total) by mouth every morning., Disp: 90 tablet, Rfl: 0   metFORMIN (GLUCOPHAGE) 500 MG tablet, Take 500 mg by mouth daily with breakfast., Disp: , Rfl:    metoprolol succinate (TOPROL-XL) 25 MG 24 hr tablet, Take 1 tablet  (25 mg total) by mouth every morning., Disp: 90 tablet, Rfl: 0   Multiple Vitamins-Minerals (MULTIVITAMIN WITH MINERALS) tablet, Take 1 tablet by mouth daily., Disp: , Rfl:    olmesartan (BENICAR) 40 MG tablet, Take 1 tablet (40 mg total) by mouth daily at 10 pm., Disp: 90 tablet, Rfl: 0   pyridOXINE (VITAMIN B-6) 100 MG tablet, Take 100 mg by mouth daily., Disp: , Rfl:    rosuvastatin (CRESTOR) 20 MG tablet, TAKE ONE TABLET AT BEDTIME., Disp: 90 tablet, Rfl: 2   spironolactone (ALDACTONE) 25 MG tablet, Take 1 tablet (25 mg total) by mouth daily., Disp: 90 tablet, Rfl: 3   SYNTHROID 88 MCG tablet, Take 88 mcg by mouth every morning., Disp: , Rfl:    valACYclovir (VALTREX) 1000 MG tablet, Take 1,000 mg by mouth daily as needed (for herpes flare). , Disp: , Rfl:   Orders Placed This Encounter  Procedures   EKG 12-Lead    --Continue cardiac medications as reconciled in final medication list. --Return in about 6 months (around 01/06/2023) for PAF, HTN, HLD. Or sooner if needed. --Continue follow-up with your primary care physician regarding the management of your other chronic comorbid conditions.  Patient's questions and concerns were addressed to his satisfaction. He voices understanding of the instructions provided during this encounter.   This note was created using a voice recognition software as a result there may be grammatical errors inadvertently enclosed that do not reflect the nature of this encounter. Every attempt is made to correct such errors.   Nori Riis, [place] Office: [phone] Pager: [phone]

## 2022-07-11 ENCOUNTER — Other Ambulatory Visit: Payer: Self-pay | Admitting: Diagnostic Neuroimaging

## 2022-07-21 DIAGNOSIS — I10 Essential (primary) hypertension: Secondary | ICD-10-CM | POA: Diagnosis not present

## 2022-07-21 DIAGNOSIS — R140 Abdominal distension (gaseous): Secondary | ICD-10-CM | POA: Diagnosis not present

## 2022-07-22 ENCOUNTER — Other Ambulatory Visit: Payer: Self-pay | Admitting: Cardiology

## 2022-07-22 DIAGNOSIS — I4892 Unspecified atrial flutter: Secondary | ICD-10-CM

## 2022-07-22 DIAGNOSIS — Z7901 Long term (current) use of anticoagulants: Secondary | ICD-10-CM

## 2022-08-04 DIAGNOSIS — G4733 Obstructive sleep apnea (adult) (pediatric): Secondary | ICD-10-CM | POA: Diagnosis not present

## 2022-08-06 DIAGNOSIS — S76011D Strain of muscle, fascia and tendon of right hip, subsequent encounter: Secondary | ICD-10-CM | POA: Diagnosis not present

## 2022-08-10 ENCOUNTER — Telehealth: Payer: Self-pay

## 2022-08-10 DIAGNOSIS — S76011D Strain of muscle, fascia and tendon of right hip, subsequent encounter: Secondary | ICD-10-CM | POA: Diagnosis not present

## 2022-08-10 NOTE — Telephone Encounter (Signed)
Orthopedics requesting clearance to hold Eliquis to undergo revision of right total hip arthroplasty currently scheduled on 08/23/2022.  This patient is followed in office for sleep apnea.  He does have a history of TIA in 2020 and was on Plavix at that time.  He has not had any additional strokes or TIAs since that time.  Per chart review, he was transitioned to Eliquis by cardiology in 2022 after evidence of A. flutter and clearance to hold Eliquis will need to be obtained from cardiology.  Please advise orthopedics.  Thank you.

## 2022-08-10 NOTE — Telephone Encounter (Signed)
I have forwarded this note along with the pt form back advising murphey weiner they need cardiology consent on medication question. Received confirmation of fax

## 2022-08-10 NOTE — Telephone Encounter (Signed)
Surgery clearance for patient to stop his Eliquis. Placed form in [place] office to sign

## 2022-08-11 ENCOUNTER — Ambulatory Visit: Payer: Self-pay | Admitting: Physician Assistant

## 2022-08-11 DIAGNOSIS — G8929 Other chronic pain: Secondary | ICD-10-CM

## 2022-08-11 NOTE — Progress Notes (Signed)
Sent message, via epic in basket, requesting orders in epic from surgeon.  

## 2022-08-12 ENCOUNTER — Encounter: Payer: Self-pay | Admitting: Cardiology

## 2022-08-12 ENCOUNTER — Ambulatory Visit: Payer: Self-pay | Admitting: Physician Assistant

## 2022-08-12 ENCOUNTER — Ambulatory Visit: Payer: Medicare HMO | Admitting: Cardiology

## 2022-08-12 ENCOUNTER — Ambulatory Visit: Payer: Medicare HMO

## 2022-08-12 VITALS — BP 134/80 | HR 71 | Resp 16 | Ht 68.0 in | Wt 211.4 lb

## 2022-08-12 DIAGNOSIS — E7800 Pure hypercholesterolemia, unspecified: Secondary | ICD-10-CM

## 2022-08-12 DIAGNOSIS — G4733 Obstructive sleep apnea (adult) (pediatric): Secondary | ICD-10-CM

## 2022-08-12 DIAGNOSIS — I10 Essential (primary) hypertension: Secondary | ICD-10-CM | POA: Diagnosis not present

## 2022-08-12 DIAGNOSIS — Z01818 Encounter for other preprocedural examination: Secondary | ICD-10-CM

## 2022-08-12 DIAGNOSIS — G459 Transient cerebral ischemic attack, unspecified: Secondary | ICD-10-CM | POA: Diagnosis not present

## 2022-08-12 DIAGNOSIS — Z7901 Long term (current) use of anticoagulants: Secondary | ICD-10-CM | POA: Diagnosis not present

## 2022-08-12 DIAGNOSIS — Z01810 Encounter for preprocedural cardiovascular examination: Secondary | ICD-10-CM | POA: Diagnosis not present

## 2022-08-12 DIAGNOSIS — I4892 Unspecified atrial flutter: Secondary | ICD-10-CM

## 2022-08-12 NOTE — H&P (Signed)
TOTAL HIP REVISION ADMISSION H&P  Patient is admitted for right revision total hip arthroplasty.  Subjective:  Chief Complaint: right hip pain  HPI: Norman Hudson., 72 y.o. male, has a history of pain and functional disability in the right hip due to  hardware failure  and patient has failed non-surgical conservative treatments for greater than 12 weeks to include use of assistive devices and activity modification. The indications for the revision total hip arthroplasty are loosening of one or more components.  Onset of symptoms was gradual starting 1 years ago with gradually worsening course since that time.  Prior procedures on the right hip include arthroplasty.  Patient currently rates pain in the right hip at 1 out of 10 with activity.  There is worsening of pain with activity and weight bearing. Patient has evidence of prosthetic loosening by imaging studies.  This condition presents safety issues increasing the risk of falls.   There is no current active infection.  Patient Active Problem List   Diagnosis Date Noted  . Long term (current) use of anticoagulants   . Orthostatic hypotension   . Benign hypertension   . S/P total right hip arthroplasty   . History of TIA (transient ischemic attack)   . Atrial flutter, paroxysmal ([place])   . Postoperative anemia due to acute blood loss 04/27/2021  . Degenerative joint disease of right hip 04/24/2021  . Pacemaker ECG pattern 03/18/2021  . TIA (transient ischemic attack) 03/18/2021  . OSA on CPAP 03/17/2020  . 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 Loud snoring 04/10/2019  . Excessive daytime sleepiness 04/10/2019  . Other fatigue 04/10/2019  . Nocturia more than twice per night 04/10/2019  . Malignant hypertension 04/10/2019  . Low testosterone 04/03/2018  . Osteoarthritis of left hip 07/23/2016  . S/P lumbar laminectomy 07/07/2015   Past Medical History:  Diagnosis Date  . Acne   . Anxiety    takes  Xanax daily as needed  . Arthritis   . Atrial flutter, paroxysmal ([place])   . History of kidney stones   . Hyperlipidemia    takes Pravastatin daily  . Hypertension    takes Amlodipine and Valsartan-HCTZ daily  . Hypothyroidism    takes Synthroid daily  . Inguinal hernia    left side  . Internal hemorrhoids   . Joint pain   . Kidney stones   . Pre-diabetes   . Rosacea   . TIA (transient ischemic attack) 06/2019    Past Surgical History:  Procedure Laterality Date  . COLONOSCOPY    . EXTRACORPOREAL SHOCK WAVE LITHOTRIPSY Right 12/27/2019   Procedure: EXTRACORPOREAL SHOCK WAVE LITHOTRIPSY (ESWL);  Surgeon: Cleon Gustin, MD;  Location: [facility];  Service: Urology;  Laterality: Right;  . LITHOTRIPSY  09/2015  . LUMBAR LAMINECTOMY WITH COFLEX 1 LEVEL N/A 07/07/2015   Procedure: LUMBAR FOUR-FIVE LUMBAR LAMINECTOMY WITH COFLEX;  Surgeon: Eustace Moore, MD;  Location: [place] [facility];  Service: Neurosurgery;  Laterality: N/A;  . TONSILLECTOMY    . 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   Procedure: TOTAL HIP ARTHROPLASTY;  Surgeon: Earlie Server, MD;  Location: [place];  Service: Orthopedics;  Laterality: Left;  . TOTAL HIP ARTHROPLASTY Right 04/24/2021   Procedure: TOTAL HIP ARTHROPLASTY;  Surgeon: Earlie Server, MD;  Location: [facility];  Service: Orthopedics;  Laterality: Right;    Current Outpatient Medications  Medication Sig Dispense Refill Last Dose  . acetaminophen (TYLENOL) 325 MG tablet Take 650 mg by mouth  every 6 (six) hours as needed for moderate pain.     . cholecalciferol (VITAMIN D3) 25 MCG (1000 UT) tablet Take 1,000 Units by mouth daily.     . clonazePAM (KLONOPIN) 0.5 MG tablet Take 0.5-1 tablets (0.25-0.5 mg total) by mouth at bedtime as needed for anxiety. 90 tablet 1   . ELIQUIS 5 MG TABS tablet TAKE ONE TABLET BY MOUTH TWICE DAILY 60 tablet 0   . hydrALAZINE (APRESOLINE) 25 MG tablet Take 1 tablet (25 mg total) by mouth 3 (three) times daily as  needed. Take as needed for blood pressure greater than 150/90 90 tablet 3   . hydrochlorothiazide (HYDRODIURIL) 25 MG tablet Take 1 tablet (25 mg total) by mouth every morning. 90 tablet 0   . metFORMIN (GLUCOPHAGE) 500 MG tablet Take 500 mg by mouth daily with breakfast.     . metoprolol succinate (TOPROL-XL) 25 MG 24 hr tablet Take 1 tablet (25 mg total) by mouth every morning. 90 tablet 0   . Multiple Vitamins-Minerals (MULTIVITAMIN WITH MINERALS) tablet Take 1 tablet by mouth daily.     . Multiple Vitamins-Minerals (PRESERVISION AREDS 2 PO) Take 1 capsule by mouth daily.     Marland Kitchen olmesartan (BENICAR) 40 MG tablet Take 1 tablet (40 mg total) by mouth daily at 10 pm. 90 tablet 0   . pyridOXINE (VITAMIN B-6) 100 MG tablet Take 100 mg by mouth daily.     . rosuvastatin (CRESTOR) 20 MG tablet TAKE ONE TABLET AT BEDTIME. 90 tablet 2   . spironolactone (ALDACTONE) 25 MG tablet Take 1 tablet (25 mg total) by mouth daily. 90 tablet 3   . SYNTHROID 88 MCG tablet Take 88 mcg by mouth daily before breakfast.     . traMADol (ULTRAM) 50 MG tablet Take 50 mg by mouth 2 (two) times daily.     . valACYclovir (VALTREX) 1000 MG tablet Take 1,000 mg by mouth daily as needed (for herpes flare).       No current facility-administered medications for this visit.   Allergies  Allergen Reactions  . Bactrim [Sulfamethoxazole-Trimethoprim] Hives and Itching    "threw me for a loop, hives, itching, extreme fatigue"    Social History   Tobacco Use  . Smoking status: Former    Packs/day: 1.00    Years: 15.00    Total pack years: 15.00    Types: Cigarettes    Quit date: 02/12/1989    Years since quitting: 33.5  . Smokeless tobacco: Never  . Tobacco comments:    quit smoking in 1991  Substance Use Topics  . Alcohol use: No    Family History  Problem Relation Age of Onset  . Hypertension Father   . Sleep disorder Father   . CVA Father   . Cancer Maternal Grandfather   . Hypertension Paternal Grandmother    . Diabetes Paternal Grandmother   . Alcoholism Mother   . Hypertension Sister   . Aneurysm Sister   . Atrial fibrillation Sister   . Other Neg Hx        hypogonadism  . Sleep apnea Neg Hx       Review of Systems  Musculoskeletal:  Positive for arthralgias.  All other systems reviewed and are negative.  Objective:  Physical Exam Constitutional:      General: He is not in acute distress.    Appearance: Normal appearance.  HENT:     Head: Normocephalic and atraumatic.  Eyes:     Extraocular Movements: Extraocular  movements intact.     Pupils: Pupils are equal, round, and reactive to light.  Cardiovascular:     Rate and Rhythm: Normal rate and regular rhythm.     Pulses: Normal pulses.     Heart sounds: Normal heart sounds.  Pulmonary:     Effort: Pulmonary effort is normal. No respiratory distress.     Breath sounds: Normal breath sounds. No wheezing.  Abdominal:     General: Abdomen is flat. Bowel sounds are normal. There is no distension.     Palpations: Abdomen is soft.     Tenderness: There is no abdominal tenderness.  Musculoskeletal:     Cervical back: Normal range of motion and neck supple.     Comments: RLE NVI, reproducible squeaking of right hip with passive ROM, antalgic gait favoring RLE., intact DF/PF ankle, no calf TTP  Lymphadenopathy:     Cervical: No cervical adenopathy.  Skin:    General: Skin is warm and dry.     Findings: No erythema or rash.  Neurological:     General: No focal deficit present.     Mental Status: He is alert and oriented to person, place, and time.  Psychiatric:        Mood and Affect: Mood normal.        Behavior: Behavior normal.   Vital signs in last 24 hours: '@VSRANGES'$ @   Labs:   Estimated body mass index is 32.14 kg/m as calculated from the following:   Height as of an earlier encounter on 08/12/22: '5\' 8"'$  (1.727 m).   Weight as of an earlier encounter on 08/12/22: 95.9 kg.  Imaging Review:  Plain radiographs  demonstrate  hardware failure or loosening  degenerative joint disease of the right hip(s). There is evidence of loosening of the cup poly.The bone quality appears to be good for age and reported activity level.     Assessment/Plan:  End stage arthritis, right hip(s) with failed previous arthroplasty.  The patient history, physical examination, clinical judgement of the provider and imaging studies are consistent with end stage degenerative joint disease of the right hip(s), previous total hip arthroplasty. Revision total hip arthroplasty is deemed medically necessary. The treatment options including medical management, injection therapy, arthroscopy and arthroplasty were discussed at length. The risks and benefits of total hip arthroplasty were presented and reviewed. The risks due to aseptic loosening, infection, stiffness, dislocation/subluxation,  thromboembolic complications and other imponderables were discussed.  The patient acknowledged the explanation, agreed to proceed with the plan and consent was signed. Patient is being admitted for inpatient treatment for surgery, pain control, PT, OT, prophylactic antibiotics, VTE prophylaxis, progressive ambulation and ADL's and discharge planning. The patient is planning to be discharged home with home health services

## 2022-08-12 NOTE — Progress Notes (Addendum)
COVID Vaccine Completed: yes  Date of COVID positive in last 90 days: no  PCP - Inez Catalina Timberlake,MD Cardiologist - Rex Kras, MD  Cardiac clearance by Rex Kras 08/12/22 in Epic  Chest x-ray - n/a EKG - 08/12/22 Epic Stress Test - 04/23/19 Epic ECHO - 08/12/22 Epic Cardiac Cath - n/a Pacemaker/ICD device last checked: n/a Spinal Cord Stimulator: n/a  Bowel Prep - no  Sleep Study - yes CPAP -  yes every night  Fasting Blood Sugar - preDM Checks Blood Sugar no checks at home  Last dose of GLP1 agonist-  N/A GLP1 instructions:  N/A   Last dose of SGLT-2 inhibitors-  N/A SGLT-2 instructions: N/A   Blood Thinner Instructions: Eliquis, hold 2 days before surgery Aspirin Instructions: Last Dose: 08/20/22 2200 per cards  Activity level: Can go up a flight of stairs and perform activities of daily living without stopping and without symptoms of chest pain or shortness of breath.   Anesthesia review: TIA, HTN, a flutter, OSA, heart and BP issues during hospitalization for hip replacement  Patient denies shortness of breath, fever, cough and chest pain at PAT appointment  Patient verbalized understanding of instructions that were given to them at the PAT appointment. Patient was also instructed that they will need to review over the PAT instructions again at home before surgery.

## 2022-08-12 NOTE — H&P (View-Only) (Signed)
TOTAL HIP REVISION ADMISSION H&P  Patient is admitted for right revision total hip arthroplasty.  Subjective:  Chief Complaint: right hip pain  HPI: Norman Hudson., 72 y.o. male, has a history of pain and functional disability in the right hip due to  hardware failure  and patient has failed non-surgical conservative treatments for greater than 12 weeks to include use of assistive devices and activity modification. The indications for the revision total hip arthroplasty are loosening of one or more components.  Onset of symptoms was gradual starting 1 years ago with gradually worsening course since that time.  Prior procedures on the right hip include arthroplasty.  Patient currently rates pain in the right hip at 1 out of 10 with activity.  There is worsening of pain with activity and weight bearing. Patient has evidence of prosthetic loosening by imaging studies.  This condition presents safety issues increasing the risk of falls.   There is no current active infection.  Patient Active Problem List   Diagnosis Date Noted  . Long term (current) use of anticoagulants   . Orthostatic hypotension   . Benign hypertension   . S/P total right hip arthroplasty   . History of TIA (transient ischemic attack)   . Atrial flutter, paroxysmal ([place])   . Postoperative anemia due to acute blood loss 04/27/2021  . Degenerative joint disease of right hip 04/24/2021  . Pacemaker ECG pattern 03/18/2021  . TIA (transient ischemic attack) 03/18/2021  . OSA on CPAP 03/17/2020  . 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 Loud snoring 04/10/2019  . Excessive daytime sleepiness 04/10/2019  . Other fatigue 04/10/2019  . Nocturia more than twice per night 04/10/2019  . Malignant hypertension 04/10/2019  . Low testosterone 04/03/2018  . Osteoarthritis of left hip 07/23/2016  . S/P lumbar laminectomy 07/07/2015   Past Medical History:  Diagnosis Date  . Acne   . Anxiety    takes  Xanax daily as needed  . Arthritis   . Atrial flutter, paroxysmal ([place])   . History of kidney stones   . Hyperlipidemia    takes Pravastatin daily  . Hypertension    takes Amlodipine and Valsartan-HCTZ daily  . Hypothyroidism    takes Synthroid daily  . Inguinal hernia    left side  . Internal hemorrhoids   . Joint pain   . Kidney stones   . Pre-diabetes   . Rosacea   . TIA (transient ischemic attack) 06/2019    Past Surgical History:  Procedure Laterality Date  . COLONOSCOPY    . EXTRACORPOREAL SHOCK WAVE LITHOTRIPSY Right 12/27/2019   Procedure: EXTRACORPOREAL SHOCK WAVE LITHOTRIPSY (ESWL);  Surgeon: Cleon Gustin, MD;  Location: [facility];  Service: Urology;  Laterality: Right;  . LITHOTRIPSY  09/2015  . LUMBAR LAMINECTOMY WITH COFLEX 1 LEVEL N/A 07/07/2015   Procedure: LUMBAR FOUR-FIVE LUMBAR LAMINECTOMY WITH COFLEX;  Surgeon: Eustace Moore, MD;  Location: [place] [facility];  Service: Neurosurgery;  Laterality: N/A;  . TONSILLECTOMY    . 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   Procedure: TOTAL HIP ARTHROPLASTY;  Surgeon: Earlie Server, MD;  Location: [place];  Service: Orthopedics;  Laterality: Left;  . TOTAL HIP ARTHROPLASTY Right 04/24/2021   Procedure: TOTAL HIP ARTHROPLASTY;  Surgeon: Earlie Server, MD;  Location: [facility];  Service: Orthopedics;  Laterality: Right;    Current Outpatient Medications  Medication Sig Dispense Refill Last Dose  . acetaminophen (TYLENOL) 325 MG tablet Take 650 mg by mouth  every 6 (six) hours as needed for moderate pain.     . cholecalciferol (VITAMIN D3) 25 MCG (1000 UT) tablet Take 1,000 Units by mouth daily.     . clonazePAM (KLONOPIN) 0.5 MG tablet Take 0.5-1 tablets (0.25-0.5 mg total) by mouth at bedtime as needed for anxiety. 90 tablet 1   . ELIQUIS 5 MG TABS tablet TAKE ONE TABLET BY MOUTH TWICE DAILY 60 tablet 0   . hydrALAZINE (APRESOLINE) 25 MG tablet Take 1 tablet (25 mg total) by mouth 3 (three) times daily as  needed. Take as needed for blood pressure greater than 150/90 90 tablet 3   . hydrochlorothiazide (HYDRODIURIL) 25 MG tablet Take 1 tablet (25 mg total) by mouth every morning. 90 tablet 0   . metFORMIN (GLUCOPHAGE) 500 MG tablet Take 500 mg by mouth daily with breakfast.     . metoprolol succinate (TOPROL-XL) 25 MG 24 hr tablet Take 1 tablet (25 mg total) by mouth every morning. 90 tablet 0   . Multiple Vitamins-Minerals (MULTIVITAMIN WITH MINERALS) tablet Take 1 tablet by mouth daily.     . Multiple Vitamins-Minerals (PRESERVISION AREDS 2 PO) Take 1 capsule by mouth daily.     Marland Kitchen olmesartan (BENICAR) 40 MG tablet Take 1 tablet (40 mg total) by mouth daily at 10 pm. 90 tablet 0   . pyridOXINE (VITAMIN B-6) 100 MG tablet Take 100 mg by mouth daily.     . rosuvastatin (CRESTOR) 20 MG tablet TAKE ONE TABLET AT BEDTIME. 90 tablet 2   . spironolactone (ALDACTONE) 25 MG tablet Take 1 tablet (25 mg total) by mouth daily. 90 tablet 3   . SYNTHROID 88 MCG tablet Take 88 mcg by mouth daily before breakfast.     . traMADol (ULTRAM) 50 MG tablet Take 50 mg by mouth 2 (two) times daily.     . valACYclovir (VALTREX) 1000 MG tablet Take 1,000 mg by mouth daily as needed (for herpes flare).       No current facility-administered medications for this visit.   Allergies  Allergen Reactions  . Bactrim [Sulfamethoxazole-Trimethoprim] Hives and Itching    "threw me for a loop, hives, itching, extreme fatigue"    Social History   Tobacco Use  . Smoking status: Former    Packs/day: 1.00    Years: 15.00    Total pack years: 15.00    Types: Cigarettes    Quit date: 02/12/1989    Years since quitting: 33.5  . Smokeless tobacco: Never  . Tobacco comments:    quit smoking in 1991  Substance Use Topics  . Alcohol use: No    Family History  Problem Relation Age of Onset  . Hypertension Father   . Sleep disorder Father   . CVA Father   . Cancer Maternal Grandfather   . Hypertension Paternal Grandmother    . Diabetes Paternal Grandmother   . Alcoholism Mother   . Hypertension Sister   . Aneurysm Sister   . Atrial fibrillation Sister   . Other Neg Hx        hypogonadism  . Sleep apnea Neg Hx       Review of Systems  Musculoskeletal:  Positive for arthralgias.  All other systems reviewed and are negative.  Objective:  Physical Exam Constitutional:      General: He is not in acute distress.    Appearance: Normal appearance.  HENT:     Head: Normocephalic and atraumatic.  Eyes:     Extraocular Movements: Extraocular  movements intact.     Pupils: Pupils are equal, round, and reactive to light.  Cardiovascular:     Rate and Rhythm: Normal rate and regular rhythm.     Pulses: Normal pulses.     Heart sounds: Normal heart sounds.  Pulmonary:     Effort: Pulmonary effort is normal. No respiratory distress.     Breath sounds: Normal breath sounds. No wheezing.  Abdominal:     General: Abdomen is flat. Bowel sounds are normal. There is no distension.     Palpations: Abdomen is soft.     Tenderness: There is no abdominal tenderness.  Musculoskeletal:     Cervical back: Normal range of motion and neck supple.     Comments: RLE NVI, reproducible squeaking of right hip with passive ROM, antalgic gait favoring RLE., intact DF/PF ankle, no calf TTP  Lymphadenopathy:     Cervical: No cervical adenopathy.  Skin:    General: Skin is warm and dry.     Findings: No erythema or rash.  Neurological:     General: No focal deficit present.     Mental Status: He is alert and oriented to person, place, and time.  Psychiatric:        Mood and Affect: Mood normal.        Behavior: Behavior normal.   Vital signs in last 24 hours: '@VSRANGES'$ @   Labs:   Estimated body mass index is 32.14 kg/m as calculated from the following:   Height as of an earlier encounter on 08/12/22: '5\' 8"'$  (1.727 m).   Weight as of an earlier encounter on 08/12/22: 95.9 kg.  Imaging Review:  Plain radiographs  demonstrate  hardware failure or loosening  degenerative joint disease of the right hip(s). There is evidence of loosening of the cup poly.The bone quality appears to be good for age and reported activity level.     Assessment/Plan:  End stage arthritis, right hip(s) with failed previous arthroplasty.  The patient history, physical examination, clinical judgement of the provider and imaging studies are consistent with end stage degenerative joint disease of the right hip(s), previous total hip arthroplasty. Revision total hip arthroplasty is deemed medically necessary. The treatment options including medical management, injection therapy, arthroscopy and arthroplasty were discussed at length. The risks and benefits of total hip arthroplasty were presented and reviewed. The risks due to aseptic loosening, infection, stiffness, dislocation/subluxation,  thromboembolic complications and other imponderables were discussed.  The patient acknowledged the explanation, agreed to proceed with the plan and consent was signed. Patient is being admitted for inpatient treatment for surgery, pain control, PT, OT, prophylactic antibiotics, VTE prophylaxis, progressive ambulation and ADL's and discharge planning. The patient is planning to be discharged home with home health services

## 2022-08-12 NOTE — Patient Instructions (Addendum)
SURGICAL WAITING ROOM VISITATION  Patients having surgery or a procedure may have no more than 2 support people in the waiting area - these visitors may rotate.    Children under the age of 29 must have an adult with them who is not the patient.  Due to an increase in RSV and influenza rates and associated hospitalizations, children ages 50 and under may not visit patients in [place].  If the patient needs to stay at the hospital during part of their recovery, the visitor guidelines for inpatient rooms apply. Pre-op nurse will coordinate an appropriate time for 1 support person to accompany patient in pre-op.  This support person may not rotate.    Please refer to the [facility] website for the visitor guidelines for Inpatients (after your surgery is over and you are in a regular room).     Your procedure is scheduled on: 08/23/22   Report to [facility] Main Entrance    Report to admitting at 10:25 AM   Call this number if you have problems the morning of surgery [phone]   Do not eat food :After Midnight.   After Midnight you may have the following liquids until 9:55 AM DAY OF SURGERY  Water Non-Citrus Juices (without pulp, NO RED-Apple, White grape, White cranberry) Black Coffee (NO MILK/CREAM OR CREAMERS, sugar ok)  Clear Tea (NO MILK/CREAM OR CREAMERS, sugar ok) regular and decaf                             Plain Jell-O (NO RED)                                           Fruit ices (not with fruit pulp, NO RED)                                     Popsicles (NO RED)                                                               Sports drinks like Gatorade (NO RED)                 The day of surgery:  Drink ONE (1) Pre-Surgery G2 at 9:55 AM the morning of surgery. Drink in one sitting. Do not sip.  This drink was given to you during your hospital  pre-op appointment visit. Nothing else to drink after completing the  Pre-Surgery G2.          If you  have questions, please contact your surgeon's office.   FOLLOW BOWEL PREP AND ANY ADDITIONAL PRE OP INSTRUCTIONS YOU RECEIVED FROM YOUR SURGEON'S OFFICE!!!     Oral Hygiene is also important to reduce your risk of infection.                                    Remember - BRUSH YOUR TEETH THE MORNING OF SURGERY WITH YOUR REGULAR TOOTHPASTE  DENTURES WILL BE  REMOVED PRIOR TO SURGERY PLEASE DO NOT APPLY "Poly grip" OR ADHESIVES!!!   Take these medicines the morning of surgery with A SIP OF WATER: Tylenol, Hydralazine, Metoprolol, Synthroid, Tramadol   How to Manage Your Diabetes Before and After Surgery  Why is it important to control my blood sugar before and after surgery? Improving blood sugar levels before and after surgery helps healing and can limit problems. A way of improving blood sugar control is eating a healthy diet by:  Eating less sugar and carbohydrates  Increasing activity/exercise  Talking with your doctor about reaching your blood sugar goals High blood sugars (greater than 180 mg/dL) can raise your risk of infections and slow your recovery, so you will need to focus on controlling your diabetes during the weeks before surgery. Make sure that the doctor who takes care of your diabetes knows about your planned surgery including the date and location.  How do I manage my blood sugar before surgery? Check your blood sugar at least 4 times a day, starting 2 days before surgery, to make sure that the level is not too high or low. Check your blood sugar the morning of your surgery when you wake up and every 2 hours until you get to the Short Stay unit. If your blood sugar is less than 70 mg/dL, you will need to treat for low blood sugar: Do not take insulin. Treat a low blood sugar (less than 70 mg/dL) with  cup of clear juice (cranberry or apple), 4 glucose tablets, OR glucose gel. Recheck blood sugar in 15 minutes after treatment (to make sure it is greater than 70 mg/dL).  If your blood sugar is not greater than 70 mg/dL on recheck, call [phone] for further instructions. Report your blood sugar to the short stay nurse when you get to Short Stay.  If you are admitted to the hospital after surgery: Your blood sugar will be checked by the staff and you will probably be given insulin after surgery (instead of oral diabetes medicines) to make sure you have good blood sugar levels. The goal for blood sugar control after surgery is 80-180 mg/dL.   WHAT DO I DO ABOUT MY DIABETES MEDICATION?  Do not take oral diabetes medicines (pills) the morning of surgery.  THE DAY BEFORE SURGERY, take Metformin as prescribed.      THE MORNING OF SURGERY, do not take Metformin.   Reviewed and Endorsed by [facility] Patient Education Committee, August 2015  DO NOT TAKE ANY ORAL DIABETIC MEDICATIONS DAY OF YOUR SURGERY  Bring CPAP mask and tubing day of surgery.                              You may not have any metal on your body including jewelry, and body piercing             Do not wear lotions, powders, cologne, or deodorant              Men may shave face and neck.   Do not bring valuables to the hospital. [place].   Contacts, glasses, dentures or bridgework may not be worn into surgery.   Bring small overnight bag day of surgery.   DO NOT [place]. PHARMACY WILL DISPENSE MEDICATIONS LISTED ON YOUR MEDICATION LIST TO  YOU DURING YOUR ADMISSION IN [place]!   Special Instructions: Bring a copy of your healthcare power of attorney and living will documents the day of surgery if you haven't scanned them before.              Please read over the following fact sheets you were given: IF [place] [phone] Schneiders    If you received a COVID test during your pre-op visit  it is requested that you wear a mask when out in  public, stay away from anyone that may not be feeling well and notify your surgeon if you develop symptoms. If you test positive for Covid or have been in contact with anyone that has tested positive in the last 10 days please notify you surgeon.  [facility] - Preparing for Surgery Before surgery, you can play an important role.  Because skin is not sterile, your skin needs to be as free of germs as possible.  You can reduce the number of germs on your skin by washing with CHG (chlorahexidine gluconate) soap before surgery.  CHG is an antiseptic cleaner which kills germs and bonds with the skin to continue killing germs even after washing. Please DO NOT use if you have an allergy to CHG or antibacterial soaps.  If your skin becomes reddened/irritated stop using the CHG and inform your nurse when you arrive at Short Stay. Do not shave (including legs and underarms) for at least 48 hours prior to the first CHG shower.  You may shave your face/neck.  Please follow these instructions carefully:  1.  Shower with CHG Soap the night before surgery and the  morning of surgery.  2.  If you choose to wash your hair, wash your hair first as usual with your normal  shampoo.  3.  After you shampoo, rinse your hair and body thoroughly to remove the shampoo.                             4.  Use CHG as you would any other liquid soap.  You can apply chg directly to the skin and wash.  Gently with a scrungie or clean washcloth.  5.  Apply the CHG Soap to your body ONLY FROM THE NECK DOWN.   Do   not use on face/ open                           Wound or open sores. Avoid contact with eyes, ears mouth and   genitals (private parts).                       Wash face,  Genitals (private parts) with your normal soap.             6.  Wash thoroughly, paying special attention to the area where your    surgery  will be performed.  7.  Thoroughly rinse your body with warm water from the neck down.  8.  DO NOT shower/wash with  your normal soap after using and rinsing off the CHG Soap.                9.  Pat yourself dry with a clean towel.            10.  Wear clean pajamas.  11.  Place clean sheets on your bed the night of your first shower and do not  sleep with pets. Day of Surgery : Do not apply any lotions/deodorants the morning of surgery.  Please wear clean clothes to the hospital/surgery center.  FAILURE TO FOLLOW THESE INSTRUCTIONS MAY RESULT IN THE CANCELLATION OF YOUR SURGERY  PATIENT SIGNATURE_________________________________  NURSE SIGNATURE__________________________________  ________________________________________________________________________      WHAT IS A BLOOD TRANSFUSION? Blood Transfusion Information  A transfusion is the replacement of blood or some of its parts. Blood is made up of multiple cells which provide different functions. Red blood cells carry oxygen and are used for blood loss replacement. White blood cells fight against infection. Platelets control bleeding. Plasma helps clot blood. Other blood products are available for specialized needs, such as hemophilia or other clotting disorders. BEFORE THE TRANSFUSION  Who gives blood for transfusions?  Healthy volunteers who are fully evaluated to make sure their blood is safe. This is blood bank blood. Transfusion therapy is the safest it has ever been in the practice of medicine. Before blood is taken from a donor, a complete history is taken to make sure that person has no history of diseases nor engages in risky social behavior (examples are intravenous drug use or sexual activity with multiple partners). The donor's travel history is screened to minimize risk of transmitting infections, such as malaria. The donated blood is tested for signs of infectious diseases, such as HIV and hepatitis. The blood is then tested to be sure it is compatible with you in order to minimize the chance of a transfusion reaction. If you or  a relative donates blood, this is often done in anticipation of surgery and is not appropriate for emergency situations. It takes many days to process the donated blood. RISKS AND COMPLICATIONS Although transfusion therapy is very safe and saves many lives, the main dangers of transfusion include:  Getting an infectious disease. Developing a transfusion reaction. This is an allergic reaction to something in the blood you were given. Every precaution is taken to prevent this. The decision to have a blood transfusion has been considered carefully by your caregiver before blood is given. Blood is not given unless the benefits outweigh the risks. AFTER THE TRANSFUSION Right after receiving a blood transfusion, you will usually feel much better and more energetic. This is especially true if your red blood cells have gotten low (anemic). The transfusion raises the level of the red blood cells which carry oxygen, and this usually causes an energy increase. The nurse administering the transfusion will monitor you carefully for complications. HOME CARE INSTRUCTIONS  No special instructions are needed after a transfusion. You may find your energy is better. Speak with your caregiver about any limitations on activity for underlying diseases you may have. SEEK MEDICAL CARE IF:  Your condition is not improving after your transfusion. You develop redness or irritation at the intravenous (IV) site. SEEK IMMEDIATE MEDICAL CARE IF:  Any of the following symptoms occur over the next 12 hours: Shaking chills. You have a temperature by mouth above 102 F (38.9 C), not controlled by medicine. Chest, back, or muscle pain. People around you feel you are not acting correctly or are confused. Shortness of breath or difficulty breathing. Dizziness and fainting. You get a rash or develop hives. You have a decrease in urine output. Your urine turns a dark color or changes to pink, red, or brown. Any of the following  symptoms occur over the next  10 days: You have a temperature by mouth above 102 F (38.9 C), not controlled by medicine. Shortness of breath. Weakness after normal activity. The white part of the eye turns yellow (jaundice). You have a decrease in the amount of urine or are urinating less often. Your urine turns a dark color or changes to pink, red, or brown. Document Released: 07/23/2000 Document Revised: 10/18/2011 Document Reviewed: 03/11/2008 Exit[employer] Patient Information 2014 [place].  _______________________________________________________________________  Incentive Spirometer  An incentive spirometer is a tool that can help keep your lungs clear and active. This tool measures how well you are filling your lungs with each breath. Taking long deep breaths may help reverse or decrease the chance of developing breathing (pulmonary) problems (especially infection) following: A long period of time when you are unable to move or be active. BEFORE THE PROCEDURE  If the spirometer includes an indicator to show your best effort, your nurse or respiratory therapist will set it to a desired goal. If possible, sit up straight or lean slightly forward. Try not to slouch. Hold the incentive spirometer in an upright position. INSTRUCTIONS FOR USE  Sit on the edge of your bed if possible, or sit up as far as you can in bed or on a chair. Hold the incentive spirometer in an upright position. Breathe out normally. Place the mouthpiece in your mouth and seal your lips tightly around it. Breathe in slowly and as deeply as possible, raising the piston or the ball toward the top of the column. Hold your breath for 3-5 seconds or for as long as possible. Allow the piston or ball to fall to the bottom of the column. Remove the mouthpiece from your mouth and breathe out normally. Rest for a few seconds and repeat Steps 1 through 7 at least 10 times every 1-2 hours when you are awake. Take your time  and take a few normal breaths between deep breaths. The spirometer may include an indicator to show your best effort. Use the indicator as a goal to work toward during each repetition. After each set of 10 deep breaths, practice coughing to be sure your lungs are clear. If you have an incision (the cut made at the time of surgery), support your incision when coughing by placing a pillow or rolled up towels firmly against it. Once you are able to get out of bed, walk around indoors and cough well. You may stop using the incentive spirometer when instructed by your caregiver.  RISKS AND COMPLICATIONS Take your time so you do not get dizzy or light-headed. If you are in pain, you may need to take or ask for pain medication before doing incentive spirometry. It is harder to take a deep breath if you are having pain. AFTER USE Rest and breathe slowly and easily. It can be helpful to keep track of a log of your progress. Your caregiver can provide you with a simple table to help with this. If you are using the spirometer at home, follow these instructions: [place] IF:  You are having difficultly using the spirometer. You have trouble using the spirometer as often as instructed. Your pain medication is not giving enough relief while using the spirometer. You develop fever of 100.5 F (38.1 C) or higher. SEEK IMMEDIATE MEDICAL CARE IF:  You cough up bloody sputum that had not been present before. You develop fever of 102 F (38.9 C) or greater. You develop worsening pain at or near the incision site. MAKE  SURE YOU:  Understand these instructions. Will watch your condition. Will get help right away if you are not doing well or get worse. Document Released: 12/06/2006 Document Revised: 10/18/2011 Document Reviewed: 02/06/2007 [facility] Patient Information 2014 [place], [place].   ________________________________________________________________________

## 2022-08-12 NOTE — Progress Notes (Signed)
Norman Hudson. Date of Birth: 1951-05-20 MRN: [id number] Primary Care Provider:Timberlake, Meridee Score, MD Former Cardiology Providers: Altamese [place], APRN, FNP-C Primary Cardiologist: Tessa Lerner, DO, [facility] (established care 02/21/2020)  Date: 08/12/22 Last Office Visit: 07/08/2022  Chief Complaint  Patient presents with   Follow-up    Preop clearance -revision of right hip    HPI  Norman Allred. is a 72 y.o.  male whose past medical history and cardiovascular risk factors include: Paroxysmal atrial flutter (04/2021), prediabetes, hypertension, hyperlipidemia, chronically low testosterone, hypothyroidism, former smoker with 15 pack year history, anxiety, osteoarthritis, history of TIA, advanced age.  Patient presents today for preoperative risk stratification for his upcoming right hip revision surgery in January 2024.  He denies anginal discomfort or heart failure symptoms.  No recent hospitalization.  He is due for labs tomorrow.  No history of ACS in the recent past, no history of complex arrhythmia in the recent past, history of TIA but no CVA.  Prediabetic and not on insulin.  Overall functional capacity is limited due to his right hip.  But is able to do his activities of daily living and walks without any limitation but has to take breaks (due to orthopedic reasons). Last ischemic workup reviewed.  ALLERGIES: Allergies  Allergen Reactions   Bactrim [Sulfamethoxazole-Trimethoprim] Hives and Itching    "threw me for a loop, hives, itching, extreme fatigue"    MEDICATION LIST PRIOR TO VISIT: Current Outpatient Medications on File Prior to Visit  Medication Sig Dispense Refill   acetaminophen (TYLENOL) 325 MG tablet Take 650 mg by mouth every 6 (six) hours as needed for moderate pain.     cholecalciferol (VITAMIN D3) 25 MCG (1000 UT) tablet Take 1,000 Units by mouth daily.     clonazePAM (KLONOPIN) 0.5 MG tablet Take 0.5-1 tablets (0.25-0.5 mg total) by  mouth at bedtime as needed for anxiety. 90 tablet 1   ELIQUIS 5 MG TABS tablet TAKE ONE TABLET BY MOUTH TWICE DAILY 60 tablet 0   hydrALAZINE (APRESOLINE) 25 MG tablet Take 1 tablet (25 mg total) by mouth 3 (three) times daily as needed. Take as needed for blood pressure greater than 150/90 90 tablet 3   hydrochlorothiazide (HYDRODIURIL) 25 MG tablet Take 1 tablet (25 mg total) by mouth every morning. 90 tablet 0   metFORMIN (GLUCOPHAGE) 500 MG tablet Take 500 mg by mouth daily with breakfast.     metoprolol succinate (TOPROL-XL) 25 MG 24 hr tablet Take 1 tablet (25 mg total) by mouth every morning. 90 tablet 0   Multiple Vitamins-Minerals (MULTIVITAMIN WITH MINERALS) tablet Take 1 tablet by mouth daily.     Multiple Vitamins-Minerals (PRESERVISION AREDS 2 PO) Take 1 capsule by mouth daily.     olmesartan (BENICAR) 40 MG tablet Take 1 tablet (40 mg total) by mouth daily at 10 pm. 90 tablet 0   pyridOXINE (VITAMIN B-6) 100 MG tablet Take 100 mg by mouth daily.     rosuvastatin (CRESTOR) 20 MG tablet TAKE ONE TABLET AT BEDTIME. 90 tablet 2   spironolactone (ALDACTONE) 25 MG tablet Take 1 tablet (25 mg total) by mouth daily. 90 tablet 3   SYNTHROID 88 MCG tablet Take 88 mcg by mouth daily before breakfast.     traMADol (ULTRAM) 50 MG tablet Take 50 mg by mouth 2 (two) times daily.     valACYclovir (VALTREX) 1000 MG tablet Take 1,000 mg by mouth daily as needed (for herpes flare).      No  current facility-administered medications on file prior to visit.    PAST MEDICAL HISTORY: Past Medical History:  Diagnosis Date   Acne    Anxiety    takes Xanax daily as needed   Arthritis    Atrial flutter, paroxysmal (HCC)    History of kidney stones    Hyperlipidemia    takes Pravastatin daily   Hypertension    takes Amlodipine and Valsartan-HCTZ daily   Hypothyroidism    takes Synthroid daily   Inguinal hernia    left side   Internal hemorrhoids    Joint pain    Kidney stones    Pre-diabetes     Rosacea    TIA (transient ischemic attack) 06/2019    PAST SURGICAL HISTORY: Past Surgical History:  Procedure Laterality Date   COLONOSCOPY     EXTRACORPOREAL SHOCK WAVE LITHOTRIPSY Right 12/27/2019   Procedure: EXTRACORPOREAL SHOCK WAVE LITHOTRIPSY (ESWL);  Surgeon: Malen Gauze, MD;  Location: [facility];  Service: Urology;  Laterality: Right;   LITHOTRIPSY  09/2015   LUMBAR LAMINECTOMY WITH COFLEX 1 LEVEL N/A 07/07/2015   Procedure: LUMBAR FOUR-FIVE LUMBAR LAMINECTOMY WITH COFLEX;  Surgeon: Tia Alert, MD;  Location: [facility];  Service: Neurosurgery;  Laterality: N/A;   TONSILLECTOMY     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   Procedure: TOTAL HIP ARTHROPLASTY;  Surgeon: Frederico Hamman, MD;  Location: [facility];  Service: Orthopedics;  Laterality: Left;   TOTAL HIP ARTHROPLASTY Right 04/24/2021   Procedure: TOTAL HIP ARTHROPLASTY;  Surgeon: Frederico Hamman, MD;  Location: [facility];  Service: Orthopedics;  Laterality: Right;    FAMILY HISTORY: The patient's family history includes Alcoholism in his mother; Aneurysm in his sister; Atrial fibrillation in his sister; CVA in his father; Cancer in his maternal grandfather; Diabetes in his paternal grandmother; Hypertension in his father, paternal grandmother, and sister; Sleep disorder in his father.   SOCIAL HISTORY:  The patient  reports that he quit smoking about 33 years ago. His smoking use included cigarettes. He has a 15.00 pack-year smoking history. He has never used smokeless tobacco. He reports that he does not drink alcohol and does not use drugs.  Review of Systems  Cardiovascular:  Negative for chest pain, claudication, dyspnea on exertion, irregular heartbeat, leg swelling, near-syncope, orthopnea, palpitations, paroxysmal nocturnal dyspnea and syncope.  Respiratory:  Negative for shortness of breath.   Hematologic/Lymphatic: Negative for bleeding problem.  Musculoskeletal:  Negative for muscle  cramps and myalgias.  Neurological:  Negative for dizziness and light-headedness.    PHYSICAL EXAM:    08/12/2022   11:28 AM 07/08/2022   12:57 PM 07/08/2022   12:48 PM  Vitals with BMI  Height 5\' 8"   5\' 8"   Weight 211 lbs 6 oz  204 lbs 3 oz  BMI 32.15  31.06  Systolic 134 159 469  Diastolic 80 84 89  Pulse 71 72 69    CONSTITUTIONAL: Well-developed and well-nourished. No acute distress.  Walks with a cane SKIN: Skin is warm and dry. No rash noted. No cyanosis. No pallor. No jaundice HEAD: Normocephalic and atraumatic.  EYES: No scleral icterus MOUTH/THROAT: Moist oral membranes.  NECK: No JVD present. No thyromegaly noted. No carotid bruits  CHEST Normal respiratory effort. No intercostal retractions  LUNGS: Clear to auscultation bilaterally.  No stridor. No wheezes. No rales.  CARDIOVASCULAR: Regular rate and rhythm, positive S1-S2, no murmurs rubs or gallops appreciated. ABDOMINAL: Soft, nontender, nondistended, positive bowel sounds in all 4 quadrants.  No apparent ascites.  EXTREMITIES: No peripheral edema. HEMATOLOGIC: No significant bruising NEUROLOGIC: Oriented to person, place, and time. Nonfocal. Normal muscle tone.  PSYCHIATRIC: Normal mood and affect. Normal behavior. Cooperative  CARDIAC DATABASE: EKG: 08/12/2022: Normal sinus rhythm, 60 bpm, without underlying ischemia injury pattern.  Echocardiogram: 08/12/2022:  Normal LV systolic function with visual EF 60-65%. Left ventricle cavity  is normal in size. Normal left ventricular wall thickness. Normal global  wall motion. Normal diastolic filling pattern, normal LAP.  Aortic valve sclerosis without stenosis.  Mild tricuspid regurgitation. No evidence of pulmonary hypertension.  Compared to 04/29/2021 otherwise no significant change   Stress Testing:  Exercise myoview stress test  04/23/2019: Patient exercised for a total of 6:13 min., achieving approximately 7 METs. Normal BP response. Exercise was  terminated due to fatigue/weakness and achieving THR. Normal myocardial perfusion. All segments of left ventricle demonstrated normal wall motion and thickening. Stress LV EF is hyperdynamic 75%. Low risk study.    Event Monitor for 30 days Start date 07/11/2019: There were no symptomatic triggered events. Predominant rhythm is normal sinus rhythm. Rare PACs and PVCs. No atrial fibrillation.   21 day event monitor 07/07-07/27/2020: NSR with occasional PAC/PVC. 1 patient triggered event without reported symptoms correlated with NSR with PAC and PVC. 2 auto detected events occurred for NSR with 5 beats of NSVT a t 173 bpm on day 1 at 1453 and NSR with 6 beats of NSVT on day 12 at 1651. Both were asymptomatic. 1 auto detected event on day 14 for Sinus tachycardia with frequent PAC's. No A fib was noted.  LABORATORY DATA:    Latest Ref Rng & Units 09/04/2021    2:48 PM 04/30/2021    6:05 AM 04/29/2021    8:47 AM  CBC  WBC 4.0 - 10.5 K/uL  7.8  6.3   Hemoglobin 13.0 - 17.7 g/dL 16.1  09.6  04.5   Hematocrit 37.5 - 51.0 % 41.8  31.8  32.1   Platelets 150 - 400 K/uL  170  159        Latest Ref Rng & Units 09/04/2021    2:48 PM 06/11/2021    2:42 PM 05/29/2021    1:48 PM  CMP  Glucose 70 - 99 mg/dL 409  811  914   BUN 8 - 27 mg/dL 17  14  10    Creatinine 0.76 - 1.27 mg/dL 7.82  9.56  2.13   Sodium 134 - 144 mmol/L 140  143  144   Potassium 3.5 - 5.2 mmol/L 4.2  4.3  4.5   Chloride 96 - 106 mmol/L 103  104  103   CO2 20 - 29 mmol/L 24  22  25    Calcium 8.6 - 10.2 mg/dL 9.5  9.9  9.6     Lipid Panel     Component Value Date/Time   CHOL 139 07/12/2019 1026   TRIG 115 07/12/2019 1026   HDL 46 07/12/2019 1026   CHOLHDL 3.0 07/12/2019 1026   LDLCALC 72 07/12/2019 1026   LABVLDL 21 07/12/2019 1026    Lab Results  Component Value Date   HGBA1C 6.0 (H) 04/16/2021   HGBA1C 5.8 (H) 07/12/2016   HGBA1C 6.1 (H) 06/27/2015   No components found for: "NTPROBNP" Lab Results  Component Value  Date   TSH 3.918 04/30/2021    Cardiac Panel (last 3 results) No results for input(s): "CKTOTAL", "CKMB", "TROPONINIHS", "RELINDX" in the last 72 hours.  External Labs: Collected: 05/14/2021 provided by  PCP Hemoglobin 10.6 g/dL, hematocrit 46.9%. Sodium 140, potassium 4.4, chloride 105, bicarb 27, BUN 14, creatinine 0.87 AST 18, ALT 22, alkaline phosphatase 67  Name Result Date Reference Range  TSH   2021-11-11    TSH 0.98   0.34-4.50  FOBT FIT ([id number])   2021-09-16    Occult Blood, Fecal, IA Negative   Negative  Thyroid Diagnostic Cascade   2021-09-10    TSH 0.33   0.34-4.50  Hemoglobin A1c   2021-09-10    eAG 140      Hgb A1c 6.5   4.8-5.6  PSA   2021-09-10    PSA 2.21   0.01-4.00  Free T4   2021-09-10    FT4 0.89   0.61-1.12  Lipid Panel w/reflex   2021-09-10    LDL Chol Calc (NIH) 44   0-99  CHOL/HDL 2.6   2.0-4.0  Cholesterol 119   <200  HDLD 46   30-70  LDL Chol Calc (NIH) 44   0-99  NHDL 73   0-129  Triglyceride 176   0-199    IMPRESSION:    ICD-10-CM   1. Pre-op evaluation  Z01.818 PCV ECHOCARDIOGRAM COMPLETE    2. Paroxysmal atrial flutter (HCC)  I48.92 EKG 12-Lead    3. Long term (current) use of anticoagulants  Z79.01     4. Essential hypertension  I10     5. TIA (transient ischemic attack)  G45.9     6. Pure hypercholesterolemia  E78.00     7. OSA on CPAP  G47.33        RECOMMENDATIONS: Slater Gettler. is a 72 y.o. male whose past medical history and cardiovascular risk factors include: prediabetes, hypertension, hyperlipidemia, chronically low testosterone, hypothyroidism, former smoker with 15 pack year history, anxiety, osteoarthritis, history of TIA, advanced age.  Pre-op evaluation Patient is being considered for a right hip revision surgery in January 2024. No active chest pain or heart failure symptoms. No recent episodes of complex dysrhythmias or ACS. On auscultation no significant murmurs. Surface ECG illustrates sinus  rhythm without underlying ischemia or injury pattern Overall functional capacity is limited due to his right hip.  But is able to do his activities of daily living and walks without any limitation but has to take breaks (due to orthopedic reasons). For these reasons echocardiogram was performed after today's office visit which re illustrates preserved LVEF without any wall motion abnormalities or significant valvular heart disease. Patient is considered acceptable risk for upcoming noncardiac surgery-and he finds his risk stratification acceptable.  Recommend holding Eliquis for 48 hours prior to surgery.  Restarting oral anticoagulation will be deferred to orthopedic surgery based on hemostasis and surgical outcomes.  Paroxysmal atrial flutter (HCC) Rate control: Metoprolol. Rhythm control: N/A. Thromboembolic prophylaxis: Eliquis. No prior history of cardioversion/ablation. EKG today illustrates sinus rhythm  Long term (current) use of anticoagulants Does not endorse evidence of bleeding. CHA2DS2-VASc SCORE is 4 which correlates to 4.6% risk of stroke per year (TIA, age, HTN).   Essential hypertension Office blood pressures are well-controlled. Continue current medical therapy. Reemphasized importance of a low-salt diet  TIA (transient ischemic attack) Educated on the importance of secondary prevention.  Pure hypercholesterolemia Currently on Crestor.   He denies myalgia or other side effects. Currently managed by primary care provider.  OSA on CPAP Patient is compliant with his CPAP on a daily basis. Follows with Dr. Vickey Huger.  FINAL MEDICATION LIST END OF ENCOUNTER: No orders of the defined types were placed in  this encounter.    Current Outpatient Medications:    acetaminophen (TYLENOL) 325 MG tablet, Take 650 mg by mouth every 6 (six) hours as needed for moderate pain., Disp: , Rfl:    cholecalciferol (VITAMIN D3) 25 MCG (1000 UT) tablet, Take 1,000 Units by mouth  daily., Disp: , Rfl:    clonazePAM (KLONOPIN) 0.5 MG tablet, Take 0.5-1 tablets (0.25-0.5 mg total) by mouth at bedtime as needed for anxiety., Disp: 90 tablet, Rfl: 1   ELIQUIS 5 MG TABS tablet, TAKE ONE TABLET BY MOUTH TWICE DAILY, Disp: 60 tablet, Rfl: 0   hydrALAZINE (APRESOLINE) 25 MG tablet, Take 1 tablet (25 mg total) by mouth 3 (three) times daily as needed. Take as needed for blood pressure greater than 150/90, Disp: 90 tablet, Rfl: 3   hydrochlorothiazide (HYDRODIURIL) 25 MG tablet, Take 1 tablet (25 mg total) by mouth every morning., Disp: 90 tablet, Rfl: 0   metFORMIN (GLUCOPHAGE) 500 MG tablet, Take 500 mg by mouth daily with breakfast., Disp: , Rfl:    metoprolol succinate (TOPROL-XL) 25 MG 24 hr tablet, Take 1 tablet (25 mg total) by mouth every morning., Disp: 90 tablet, Rfl: 0   Multiple Vitamins-Minerals (MULTIVITAMIN WITH MINERALS) tablet, Take 1 tablet by mouth daily., Disp: , Rfl:    Multiple Vitamins-Minerals (PRESERVISION AREDS 2 PO), Take 1 capsule by mouth daily., Disp: , Rfl:    olmesartan (BENICAR) 40 MG tablet, Take 1 tablet (40 mg total) by mouth daily at 10 pm., Disp: 90 tablet, Rfl: 0   pyridOXINE (VITAMIN B-6) 100 MG tablet, Take 100 mg by mouth daily., Disp: , Rfl:    rosuvastatin (CRESTOR) 20 MG tablet, TAKE ONE TABLET AT BEDTIME., Disp: 90 tablet, Rfl: 2   spironolactone (ALDACTONE) 25 MG tablet, Take 1 tablet (25 mg total) by mouth daily., Disp: 90 tablet, Rfl: 3   SYNTHROID 88 MCG tablet, Take 88 mcg by mouth daily before breakfast., Disp: , Rfl:    traMADol (ULTRAM) 50 MG tablet, Take 50 mg by mouth 2 (two) times daily., Disp: , Rfl:    valACYclovir (VALTREX) 1000 MG tablet, Take 1,000 mg by mouth daily as needed (for herpes flare). , Disp: , Rfl:   Orders Placed This Encounter  Procedures   EKG 12-Lead   PCV ECHOCARDIOGRAM COMPLETE    --Continue cardiac medications as reconciled in final medication list. --Return in about 8 weeks (around 10/07/2022) for  Follow up post-op . Or sooner if needed. --Continue follow-up with your primary care physician regarding the management of your other chronic comorbid conditions.  Patient's questions and concerns were addressed to his satisfaction. He voices understanding of the instructions provided during this encounter.   This note was created using a voice recognition software as a result there may be grammatical errors inadvertently enclosed that do not reflect the nature of this encounter. Every attempt is made to correct such errors.  Tessa Lerner, [place], [facility]  Pager: [phone] Office: [phone]

## 2022-08-13 ENCOUNTER — Encounter (HOSPITAL_COMMUNITY): Payer: Self-pay

## 2022-08-13 ENCOUNTER — Encounter (HOSPITAL_COMMUNITY)
Admission: RE | Admit: 2022-08-13 | Discharge: 2022-08-13 | Disposition: A | Discharge status: Home / Self Care | Payer: Medicare HMO | Source: Ambulatory Visit | Attending: Orthopedic Surgery | Admitting: Orthopedic Surgery

## 2022-08-13 VITALS — BP 142/81 | HR 68 | Temp 97.8°F | Resp 12 | Ht 68.0 in | Wt 211.4 lb

## 2022-08-13 DIAGNOSIS — Z96641 Presence of right artificial hip joint: Secondary | ICD-10-CM | POA: Diagnosis not present

## 2022-08-13 DIAGNOSIS — Z01818 Encounter for other preprocedural examination: Secondary | ICD-10-CM

## 2022-08-13 DIAGNOSIS — M25551 Pain in right hip: Secondary | ICD-10-CM | POA: Insufficient documentation

## 2022-08-13 DIAGNOSIS — E119 Type 2 diabetes mellitus without complications: Secondary | ICD-10-CM | POA: Insufficient documentation

## 2022-08-13 DIAGNOSIS — G8929 Other chronic pain: Secondary | ICD-10-CM | POA: Insufficient documentation

## 2022-08-13 DIAGNOSIS — Z01812 Encounter for preprocedural laboratory examination: Secondary | ICD-10-CM | POA: Diagnosis present

## 2022-08-13 LAB — GLUCOSE, CAPILLARY: Glucose-Capillary: 154 mg/dL — ABNORMAL HIGH (ref 70–99)

## 2022-08-13 LAB — COMPREHENSIVE METABOLIC PANEL
ALT: 23 U/L (ref 0–44)
AST: 27 U/L (ref 15–41)
Albumin: 4.9 g/dL (ref 3.5–5.0)
Alkaline Phosphatase: 42 U/L (ref 38–126)
Anion gap: 9 (ref 5–15)
BUN: 24 mg/dL — ABNORMAL HIGH (ref 8–23)
CO2: 25 mmol/L (ref 22–32)
Calcium: 9.5 mg/dL (ref 8.9–10.3)
Chloride: 106 mmol/L (ref 98–111)
Creatinine, Ser: 1.17 mg/dL (ref 0.61–1.24)
GFR, Estimated: >60 mL/min (ref >60)
Glucose, Bld: 152 mg/dL — ABNORMAL HIGH (ref 70–99)
Potassium: 4.6 mmol/L (ref 3.5–5.1)
Sodium: 140 mmol/L (ref 135–145)
Total Bilirubin: 0.6 mg/dL (ref 0.3–1.2)
Total Protein: 7.3 g/dL (ref 6.5–8.1)

## 2022-08-13 LAB — CBC WITH DIFFERENTIAL/PLATELET
Abs Immature Granulocytes: 0.03 10*3/uL (ref 0.00–0.07)
Basophils Absolute: 0 10*3/uL (ref 0.0–0.1)
Basophils Relative: 1 %
Eosinophils Absolute: 0.1 10*3/uL (ref 0.0–0.5)
Eosinophils Relative: 1 %
HCT: 38.3 % — ABNORMAL LOW (ref 39.0–52.0)
Hemoglobin: 13.2 g/dL (ref 13.0–17.0)
Immature Granulocytes: 1 %
Lymphocytes Relative: 26 %
Lymphs Abs: 1.5 10*3/uL (ref 0.7–4.0)
MCH: 35.5 pg — ABNORMAL HIGH (ref 26.0–34.0)
MCHC: 34.5 g/dL (ref 30.0–36.0)
MCV: 103 fL — ABNORMAL HIGH (ref 80.0–100.0)
Monocytes Absolute: 0.7 10*3/uL (ref 0.1–1.0)
Monocytes Relative: 13 %
Neutro Abs: 3.4 10*3/uL (ref 1.7–7.7)
Neutrophils Relative %: 58 %
Platelets: 158 10*3/uL (ref 150–400)
RBC: 3.72 MIL/uL — ABNORMAL LOW (ref 4.22–5.81)
RDW: 12.2 % (ref 11.5–15.5)
WBC: 5.8 10*3/uL (ref 4.0–10.5)
nRBC: 0 % (ref 0.0–0.2)

## 2022-08-13 LAB — SURGICAL PCR SCREEN
MRSA, PCR: NEGATIVE
Staphylococcus aureus: NEGATIVE

## 2022-08-14 LAB — HEMOGLOBIN A1C
Hgb A1c MFr Bld: 6.4 % — ABNORMAL HIGH (ref 4.8–5.6)
Mean Plasma Glucose: 137 mg/dL

## 2022-08-16 ENCOUNTER — Ambulatory Visit
Admission: RE | Admit: 2022-08-16 | Discharge: 2022-08-16 | Disposition: A | Discharge status: Home / Self Care | Payer: Medicare HMO | Source: Ambulatory Visit | Attending: Orthopedic Surgery | Admitting: Orthopedic Surgery

## 2022-08-16 ENCOUNTER — Other Ambulatory Visit: Payer: Self-pay | Admitting: Orthopedic Surgery

## 2022-08-16 DIAGNOSIS — Z96641 Presence of right artificial hip joint: Secondary | ICD-10-CM | POA: Diagnosis not present

## 2022-08-16 DIAGNOSIS — T84498A Other mechanical complication of other internal orthopedic devices, implants and grafts, initial encounter: Secondary | ICD-10-CM

## 2022-08-16 DIAGNOSIS — Z471 Aftercare following joint replacement surgery: Secondary | ICD-10-CM | POA: Diagnosis not present

## 2022-08-16 NOTE — Progress Notes (Signed)
Anesthesia Chart Review   Case: [id number] Date/Time: 08/23/22 1239   Procedure: TOTAL HIP REVISION. HEAD AND LINER EXCHANGE. POSSIBLE CUP REVISION (Right: Hip)   Anesthesia type: Spinal   Pre-op diagnosis: FAILED RIGHT HIP PROSTHESIS   [facility] / [facility]   Surgeons: Willaim Sheng, MD       DISCUSSION:72 y.o. former smoker with h/o HTN, paroxysmal atrial flutter, TIA, failed right hip prosthesis scheduled for above procedure 08/23/2022 with Dr. [name].   Pt seen by cardiology 08/12/2022. Per OV note, "Patient is being considered for a right hip revision surgery in January 2024. No active chest pain or heart failure symptoms. No recent episodes of complex dysrhythmias or ACS. On auscultation no significant murmurs. Surface ECG illustrates sinus rhythm without underlying ischemia or injury pattern Overall functional capacity is limited due to his right hip.  But is able to do his activities of daily living and walks without any limitation but has to take breaks (due to orthopedic reasons). For these reasons echocardiogram was performed after today's office visit which re illustrates preserved LVEF without any wall motion abnormalities or significant valvular heart disease. Patient is considered acceptable risk for upcoming noncardiac surgery-and he finds his risk stratification acceptable.  Recommend holding Eliquis for 48 hours prior to surgery.  Restarting oral anticoagulation will be deferred to orthopedic surgery based on hemostasis and surgical outcomes."  Discussed with Dr. Terri Skains, ok to hold for 72 hours. Discussed with patient.  Last dose of Eliquis will be 08/20/2022. VS: BP (!) 142/81   Pulse 68   Temp 36.6 C (Oral)   Resp 12   Ht '5\' 8"'$  (1.727 m)   Wt 95.9 kg   SpO2 100%   BMI 32.15 kg/m   PROVIDERS: Glenis Smoker, MD is PCP   Cardiologist - Rex Kras, MD  LABS: {CHL AN LABS REVIEWED:112001::"Labs reviewed: Acceptable for  surgery."} (all labs ordered are listed, but only abnormal results are displayed)  Labs Reviewed  CBC WITH DIFFERENTIAL/PLATELET - Abnormal; Notable for the following components:      Result Value   RBC 3.72 (*)    HCT 38.3 (*)    MCV 103.0 (*)    MCH 35.5 (*)    All other components within normal limits  COMPREHENSIVE METABOLIC PANEL - Abnormal; Notable for the following components:   Glucose, Bld 152 (*)    BUN 24 (*)    All other components within normal limits  HEMOGLOBIN A1C - Abnormal; Notable for the following components:   Hgb A1c MFr Bld 6.4 (*)    All other components within normal limits  GLUCOSE, CAPILLARY - Abnormal; Notable for the following components:   Glucose-Capillary 154 (*)    All other components within normal limits  SURGICAL PCR SCREEN  TYPE AND SCREEN     IMAGES:   EKG:   CV: Echocardiogram 08/12/2022:  Normal LV systolic function with visual EF 60-65%. Left ventricle cavity  is normal in size. Normal left ventricular wall thickness. Normal global  wall motion. Normal diastolic filling pattern, normal LAP.  Aortic valve sclerosis without stenosis.  Mild tricuspid regurgitation. No evidence of pulmonary hypertension.  Compared to 04/29/2021 otherwise no significant change.   Echo 04/29/21  1. Left ventricular ejection fraction, by estimation, is 60 to 65%. The  left ventricle has normal function. The left ventricle has no regional  wall motion abnormalities. Left ventricular diastolic function could not  be evaluated due to atrial flutter.  2. Right ventricular systolic function is normal. The right ventricular  size is normal. There is normal pulmonary artery systolic pressure.   3. The mitral valve is normal in structure. No evidence of mitral valve  regurgitation. No evidence of mitral stenosis.   4. The aortic valve is tricuspid. Aortic valve regurgitation is not  visualized. No aortic stenosis is present.   Exercise myoview stress test   04/23/2019: Resting EKG NSR.  Stress EKG is negative for ischemia. There were occasional PVCs and paroxysmal episodes of brief 3-8 beat atrial tachycardia during stress testing and into recovery. Patient exercised for a total of 6:13 min., achieving approximately 7 METs. Normal BP response. Exercise was terminated due to fatigue/weakness and achieving THR. Normal myocardial perfusion. All segments of left ventricle demonstrated normal wall motion and thickening. Stress LV EF is hyperdynamic 75%.  No previous exam available for comparison. Low risk study.  Past Medical History:  Diagnosis Date   Acne    Anxiety    takes Xanax daily as needed   Arthritis    Atrial flutter, paroxysmal (HCC)    History of kidney stones    Hyperlipidemia    takes Pravastatin daily   Hypertension    takes Amlodipine and Valsartan-HCTZ daily   Hypothyroidism    takes Synthroid daily   Inguinal hernia    left side   Internal hemorrhoids    Joint pain    Kidney stones    Pre-diabetes    Rosacea    TIA (transient ischemic attack) 06/2019    Past Surgical History:  Procedure Laterality Date   COLONOSCOPY     EXTRACORPOREAL SHOCK WAVE LITHOTRIPSY Right 12/27/2019   Procedure: EXTRACORPOREAL SHOCK WAVE LITHOTRIPSY (ESWL);  Surgeon: Cleon Gustin, MD;  Location: [facility];  Service: Urology;  Laterality: Right;   LITHOTRIPSY  09/2015   LUMBAR LAMINECTOMY WITH COFLEX 1 LEVEL N/A 07/07/2015   Procedure: LUMBAR FOUR-FIVE LUMBAR LAMINECTOMY WITH COFLEX;  Surgeon: Eustace Moore, MD;  Location: [place] [facility];  Service: Neurosurgery;  Laterality: N/A;   TONSILLECTOMY     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   Procedure: TOTAL HIP ARTHROPLASTY;  Surgeon: Earlie Server, MD;  Location: [place];  Service: Orthopedics;  Laterality: Left;   TOTAL HIP ARTHROPLASTY Right 04/24/2021   Procedure: TOTAL HIP ARTHROPLASTY;  Surgeon: Earlie Server, MD;  Location: [facility];  Service: Orthopedics;   Laterality: Right;    MEDICATIONS:  acetaminophen (TYLENOL) 325 MG tablet   cholecalciferol (VITAMIN D3) 25 MCG (1000 UT) tablet   clonazePAM (KLONOPIN) 0.5 MG tablet   ELIQUIS 5 MG TABS tablet   hydrALAZINE (APRESOLINE) 25 MG tablet   hydrochlorothiazide (HYDRODIURIL) 25 MG tablet   metFORMIN (GLUCOPHAGE) 500 MG tablet   metoprolol succinate (TOPROL-XL) 25 MG 24 hr tablet   Multiple Vitamins-Minerals (MULTIVITAMIN WITH MINERALS) tablet   Multiple Vitamins-Minerals (PRESERVISION AREDS 2 PO)   olmesartan (BENICAR) 40 MG tablet   pyridOXINE (VITAMIN B-6) 100 MG tablet   rosuvastatin (CRESTOR) 20 MG tablet   spironolactone (ALDACTONE) 25 MG tablet   SYNTHROID 88 MCG tablet   traMADol (ULTRAM) 50 MG tablet   valACYclovir (VALTREX) 1000 MG tablet   No current facility-administered medications for this encounter.

## 2022-08-19 NOTE — Anesthesia Preprocedure Evaluation (Addendum)
Anesthesia Evaluation  Patient identified by MRN, date of birth, ID band Patient awake    Reviewed: Allergy & Precautions, NPO status , Patient's Chart, lab work & pertinent test results  Airway Mallampati: II  TM Distance: >3 FB Neck ROM: Full    Dental  (+) Dental Advisory Given   Pulmonary sleep apnea and Continuous Positive Airway Pressure Ventilation , former smoker   breath sounds clear to auscultation       Cardiovascular hypertension, Pt. on medications + dysrhythmias  Rhythm:Regular Rate:Normal     Neuro/Psych TIA   GI/Hepatic negative GI ROS, Neg liver ROS,,,  Endo/Other  Hypothyroidism    Renal/GU negative Renal ROS     Musculoskeletal  (+) Arthritis ,    Abdominal   Peds  Hematology  (+) Blood dyscrasia, anemia   Anesthesia Other Findings   Reproductive/Obstetrics                             Anesthesia Physical Anesthesia Plan  ASA: 2  Anesthesia Plan: General   Post-op Pain Management: Tylenol PO (pre-op)*   Induction: Intravenous  PONV Risk Score and Plan: 2 and Dexamethasone, Ondansetron and Treatment may vary due to age or medical condition  Airway Management Planned: Oral ETT  Additional Equipment:   Intra-op Plan:   Post-operative Plan: Extubation in OR  Informed Consent: I have reviewed the patients History and Physical, chart, labs and discussed the procedure including the risks, benefits and alternatives for the proposed anesthesia with the patient or authorized representative who has indicated his/her understanding and acceptance.     Dental advisory given  Plan Discussed with: CRNA  Anesthesia Plan Comments:        Anesthesia Quick Evaluation

## 2022-08-22 ENCOUNTER — Other Ambulatory Visit: Payer: Self-pay | Admitting: Cardiology

## 2022-08-22 DIAGNOSIS — Z7901 Long term (current) use of anticoagulants: Secondary | ICD-10-CM

## 2022-08-22 DIAGNOSIS — I10 Essential (primary) hypertension: Secondary | ICD-10-CM

## 2022-08-22 DIAGNOSIS — I4892 Unspecified atrial flutter: Secondary | ICD-10-CM

## 2022-08-23 ENCOUNTER — Encounter (HOSPITAL_COMMUNITY)
Admission: RE | Disposition: A | Discharge status: Home / Self Care | Payer: Self-pay | Source: Home / Self Care | Attending: Orthopedic Surgery

## 2022-08-23 ENCOUNTER — Inpatient Hospital Stay (HOSPITAL_COMMUNITY): Payer: Medicare HMO | Admitting: Physician Assistant

## 2022-08-23 ENCOUNTER — Inpatient Hospital Stay (HOSPITAL_COMMUNITY): Payer: Medicare HMO

## 2022-08-23 ENCOUNTER — Encounter (HOSPITAL_COMMUNITY): Payer: Self-pay | Admitting: Orthopedic Surgery

## 2022-08-23 ENCOUNTER — Other Ambulatory Visit: Payer: Self-pay

## 2022-08-23 ENCOUNTER — Inpatient Hospital Stay (HOSPITAL_COMMUNITY)
Admission: RE | Admit: 2022-08-23 | Discharge: 2022-08-30 | DRG: 467 | Disposition: A | Discharge status: Home / Self Care | Payer: Medicare HMO | Attending: Orthopedic Surgery | Admitting: Orthopedic Surgery

## 2022-08-23 DIAGNOSIS — T84010A Broken internal right hip prosthesis, initial encounter: Secondary | ICD-10-CM | POA: Diagnosis not present

## 2022-08-23 DIAGNOSIS — I10 Essential (primary) hypertension: Secondary | ICD-10-CM | POA: Diagnosis not present

## 2022-08-23 DIAGNOSIS — T84019A Broken internal joint prosthesis, unspecified site, initial encounter: Principal | ICD-10-CM

## 2022-08-23 DIAGNOSIS — G4733 Obstructive sleep apnea (adult) (pediatric): Secondary | ICD-10-CM | POA: Diagnosis not present

## 2022-08-23 DIAGNOSIS — Z8673 Personal history of transient ischemic attack (TIA), and cerebral infarction without residual deficits: Secondary | ICD-10-CM

## 2022-08-23 DIAGNOSIS — E785 Hyperlipidemia, unspecified: Secondary | ICD-10-CM | POA: Diagnosis present

## 2022-08-23 DIAGNOSIS — Z79890 Hormone replacement therapy (postmenopausal): Secondary | ICD-10-CM

## 2022-08-23 DIAGNOSIS — Z87891 Personal history of nicotine dependence: Secondary | ICD-10-CM | POA: Diagnosis not present

## 2022-08-23 DIAGNOSIS — Z7901 Long term (current) use of anticoagulants: Secondary | ICD-10-CM

## 2022-08-23 DIAGNOSIS — Z87442 Personal history of urinary calculi: Secondary | ICD-10-CM | POA: Diagnosis not present

## 2022-08-23 DIAGNOSIS — Y792 Prosthetic and other implants, materials and accessory orthopedic devices associated with adverse incidents: Secondary | ICD-10-CM | POA: Diagnosis not present

## 2022-08-23 DIAGNOSIS — D62 Acute posthemorrhagic anemia: Secondary | ICD-10-CM | POA: Diagnosis not present

## 2022-08-23 DIAGNOSIS — I4891 Unspecified atrial fibrillation: Secondary | ICD-10-CM | POA: Diagnosis not present

## 2022-08-23 DIAGNOSIS — E119 Type 2 diabetes mellitus without complications: Secondary | ICD-10-CM

## 2022-08-23 DIAGNOSIS — Z882 Allergy status to sulfonamides status: Secondary | ICD-10-CM | POA: Diagnosis not present

## 2022-08-23 DIAGNOSIS — Z96642 Presence of left artificial hip joint: Secondary | ICD-10-CM | POA: Diagnosis present

## 2022-08-23 DIAGNOSIS — D638 Anemia in other chronic diseases classified elsewhere: Secondary | ICD-10-CM | POA: Diagnosis not present

## 2022-08-23 DIAGNOSIS — Z79899 Other long term (current) drug therapy: Secondary | ICD-10-CM

## 2022-08-23 DIAGNOSIS — Z7984 Long term (current) use of oral hypoglycemic drugs: Secondary | ICD-10-CM

## 2022-08-23 DIAGNOSIS — E039 Hypothyroidism, unspecified: Secondary | ICD-10-CM | POA: Diagnosis not present

## 2022-08-23 DIAGNOSIS — Z8249 Family history of ischemic heart disease and other diseases of the circulatory system: Secondary | ICD-10-CM

## 2022-08-23 DIAGNOSIS — Z823 Family history of stroke: Secondary | ICD-10-CM

## 2022-08-23 DIAGNOSIS — Z833 Family history of diabetes mellitus: Secondary | ICD-10-CM | POA: Diagnosis not present

## 2022-08-23 DIAGNOSIS — F419 Anxiety disorder, unspecified: Secondary | ICD-10-CM | POA: Diagnosis present

## 2022-08-23 DIAGNOSIS — R69 Illness, unspecified: Secondary | ICD-10-CM | POA: Diagnosis not present

## 2022-08-23 DIAGNOSIS — I4819 Other persistent atrial fibrillation: Secondary | ICD-10-CM

## 2022-08-23 DIAGNOSIS — Z471 Aftercare following joint replacement surgery: Secondary | ICD-10-CM | POA: Diagnosis not present

## 2022-08-23 DIAGNOSIS — T84090A Other mechanical complication of internal right hip prosthesis, initial encounter: Secondary | ICD-10-CM

## 2022-08-23 DIAGNOSIS — I484 Atypical atrial flutter: Secondary | ICD-10-CM | POA: Diagnosis not present

## 2022-08-23 DIAGNOSIS — Z96641 Presence of right artificial hip joint: Secondary | ICD-10-CM | POA: Diagnosis not present

## 2022-08-23 HISTORY — PX: TOTAL HIP REVISION: SHX763

## 2022-08-23 LAB — GLUCOSE, CAPILLARY: Glucose-Capillary: 128 mg/dL — ABNORMAL HIGH (ref 70–99)

## 2022-08-23 SURGERY — TOTAL HIP REVISION
Anesthesia: General | Site: Hip | Laterality: Right

## 2022-08-23 MED ORDER — DOCUSATE SODIUM 100 MG PO CAPS
100.0000 mg | ORAL_CAPSULE | Freq: Two times a day (BID) | ORAL | Status: DC
  Administered 2022-08-23 – 2022-08-30 (×12): 100 mg via ORAL
  Filled 2022-08-23 (×14): qty 1

## 2022-08-23 MED ORDER — ONDANSETRON HCL 4 MG/2ML IJ SOLN
INTRAMUSCULAR | Status: AC
  Filled 2022-08-23: qty 2

## 2022-08-23 MED ORDER — ONDANSETRON HCL 4 MG PO TABS: 4.0000 mg | ORAL_TABLET | Freq: Four times a day (QID) | ORAL | Status: DC | PRN

## 2022-08-23 MED ORDER — ROSUVASTATIN CALCIUM 20 MG PO TABS
20.0000 mg | ORAL_TABLET | Freq: Every day | ORAL | Status: DC
  Administered 2022-08-23 – 2022-08-29 (×7): 20 mg via ORAL
  Filled 2022-08-23 (×7): qty 1

## 2022-08-23 MED ORDER — METHOCARBAMOL 500 MG IVPB - SIMPLE MED
500.0000 mg | Freq: Four times a day (QID) | INTRAVENOUS | Status: DC | PRN
  Administered 2022-08-23: 500 mg via INTRAVENOUS

## 2022-08-23 MED ORDER — ACETAMINOPHEN 325 MG PO TABS
325.0000 mg | ORAL_TABLET | Freq: Four times a day (QID) | ORAL | Status: DC | PRN
  Administered 2022-08-25 – 2022-08-26 (×2): 650 mg via ORAL
  Filled 2022-08-23 (×2): qty 2

## 2022-08-23 MED ORDER — MENTHOL 3 MG MT LOZG
1.0000 | LOZENGE | OROMUCOSAL | Status: DC | PRN
  Administered 2022-08-24: 3 mg via ORAL
  Filled 2022-08-23: qty 9

## 2022-08-23 MED ORDER — PANTOPRAZOLE SODIUM 40 MG PO TBEC
40.0000 mg | DELAYED_RELEASE_TABLET | Freq: Every day | ORAL | Status: DC
  Administered 2022-08-24 – 2022-08-30 (×7): 40 mg via ORAL
  Filled 2022-08-23 (×7): qty 1

## 2022-08-23 MED ORDER — BUPIVACAINE LIPOSOME 1.3 % IJ SUSP
INTRAMUSCULAR | Status: AC
  Filled 2022-08-23: qty 20

## 2022-08-23 MED ORDER — METFORMIN HCL 500 MG PO TABS
500.0000 mg | ORAL_TABLET | Freq: Every day | ORAL | Status: DC
  Administered 2022-08-24 – 2022-08-30 (×6): 500 mg via ORAL
  Filled 2022-08-23 (×7): qty 1

## 2022-08-23 MED ORDER — SODIUM CHLORIDE (PF) 0.9 % IJ SOLN
INTRAMUSCULAR | Status: AC
  Filled 2022-08-23: qty 50

## 2022-08-23 MED ORDER — DEXAMETHASONE SODIUM PHOSPHATE 10 MG/ML IJ SOLN
INTRAMUSCULAR | Status: DC | PRN
  Administered 2022-08-23: 8 mg via INTRAVENOUS

## 2022-08-23 MED ORDER — HYDROMORPHONE HCL 1 MG/ML IJ SOLN: 0.5000 mg | INTRAMUSCULAR | Status: DC | PRN

## 2022-08-23 MED ORDER — PROPOFOL 1000 MG/100ML IV EMUL
INTRAVENOUS | Status: AC
  Filled 2022-08-23: qty 100

## 2022-08-23 MED ORDER — ONDANSETRON HCL 4 MG/2ML IJ SOLN: 4.0000 mg | Freq: Four times a day (QID) | INTRAMUSCULAR | Status: DC | PRN

## 2022-08-23 MED ORDER — OXYCODONE HCL 5 MG PO TABS
5.0000 mg | ORAL_TABLET | ORAL | Status: DC | PRN
  Administered 2022-08-23 – 2022-08-24 (×3): 10 mg via ORAL
  Administered 2022-08-24: 15 mg via ORAL
  Administered 2022-08-24: 10 mg via ORAL
  Administered 2022-08-24 – 2022-08-27 (×19): 15 mg via ORAL
  Administered 2022-08-27: 5 mg via ORAL
  Administered 2022-08-27: 10 mg via ORAL
  Administered 2022-08-28 – 2022-08-29 (×4): 15 mg via ORAL
  Administered 2022-08-29: 5 mg via ORAL
  Administered 2022-08-29 – 2022-08-30 (×3): 15 mg via ORAL
  Filled 2022-08-23 (×2): qty 3
  Filled 2022-08-23: qty 1
  Filled 2022-08-23: qty 3
  Filled 2022-08-23: qty 2
  Filled 2022-08-23 (×9): qty 3
  Filled 2022-08-23: qty 1
  Filled 2022-08-23 (×8): qty 3
  Filled 2022-08-23 (×2): qty 2
  Filled 2022-08-23 (×6): qty 3
  Filled 2022-08-23: qty 2
  Filled 2022-08-23: qty 3

## 2022-08-23 MED ORDER — ROCURONIUM BROMIDE 10 MG/ML (PF) SYRINGE
PREFILLED_SYRINGE | INTRAVENOUS | Status: DC | PRN
  Administered 2022-08-23: 70 mg via INTRAVENOUS

## 2022-08-23 MED ORDER — LEVOTHYROXINE SODIUM 88 MCG PO TABS
88.0000 ug | ORAL_TABLET | Freq: Every day | ORAL | Status: DC
  Administered 2022-08-24 – 2022-08-30 (×7): 88 ug via ORAL
  Filled 2022-08-23 (×7): qty 1

## 2022-08-23 MED ORDER — BUPIVACAINE LIPOSOME 1.3 % IJ SUSP: 10.0000 mL | Freq: Once | INTRAMUSCULAR | Status: DC

## 2022-08-23 MED ORDER — POLYETHYLENE GLYCOL 3350 17 G PO PACK: 17.0000 g | PACK | Freq: Every day | ORAL | Status: DC | PRN

## 2022-08-23 MED ORDER — CEFAZOLIN SODIUM-DEXTROSE 2-4 GM/100ML-% IV SOLN
2.0000 g | Freq: Three times a day (TID) | INTRAVENOUS | Status: AC
  Administered 2022-08-23 – 2022-08-26 (×9): 2 g via INTRAVENOUS
  Filled 2022-08-23 (×9): qty 100

## 2022-08-23 MED ORDER — 0.9 % SODIUM CHLORIDE (POUR BTL) OPTIME
TOPICAL | Status: DC | PRN
  Administered 2022-08-23: 1000 mL

## 2022-08-23 MED ORDER — SODIUM CHLORIDE 0.9 % IR SOLN
Status: DC | PRN
  Administered 2022-08-23: 3000 mL

## 2022-08-23 MED ORDER — PHENOL 1.4 % MT LIQD
1.0000 | OROMUCOSAL | Status: DC | PRN
  Filled 2022-08-23: qty 177

## 2022-08-23 MED ORDER — ISOPROPYL ALCOHOL 70 % SOLN
Status: DC | PRN
  Administered 2022-08-23: 1 via TOPICAL

## 2022-08-23 MED ORDER — OXYCODONE HCL 5 MG PO TABS
ORAL_TABLET | ORAL | Status: AC
  Filled 2022-08-23: qty 2

## 2022-08-23 MED ORDER — SUGAMMADEX SODIUM 200 MG/2ML IV SOLN
INTRAVENOUS | Status: DC | PRN
  Administered 2022-08-23: 200 mg via INTRAVENOUS

## 2022-08-23 MED ORDER — SODIUM CHLORIDE (PF) 0.9 % IJ SOLN
INTRAMUSCULAR | Status: DC | PRN
  Administered 2022-08-23: 60 mL

## 2022-08-23 MED ORDER — HYDRALAZINE HCL 25 MG PO TABS: 25.0000 mg | ORAL_TABLET | Freq: Three times a day (TID) | ORAL | Status: DC | PRN

## 2022-08-23 MED ORDER — WATER FOR IRRIGATION, STERILE IR SOLN
Status: DC | PRN
  Administered 2022-08-23: 2000 mL

## 2022-08-23 MED ORDER — PHENYLEPHRINE HCL-NACL 20-0.9 MG/250ML-% IV SOLN
INTRAVENOUS | Status: DC | PRN
  Administered 2022-08-23: 50 ug/min via INTRAVENOUS

## 2022-08-23 MED ORDER — PHENYLEPHRINE HCL-NACL 20-0.9 MG/250ML-% IV SOLN
INTRAVENOUS | Status: AC
  Filled 2022-08-23: qty 250

## 2022-08-23 MED ORDER — CHLORHEXIDINE GLUCONATE 0.12 % MT SOLN
15.0000 mL | Freq: Once | OROMUCOSAL | Status: AC
  Administered 2022-08-23: 15 mL via OROMUCOSAL

## 2022-08-23 MED ORDER — SODIUM CHLORIDE 0.9 % IV SOLN: INTRAVENOUS | Status: DC

## 2022-08-23 MED ORDER — AMISULPRIDE (ANTIEMETIC) 5 MG/2ML IV SOLN: 10.0000 mg | Freq: Once | INTRAVENOUS | Status: DC | PRN

## 2022-08-23 MED ORDER — ACETAMINOPHEN 500 MG PO TABS
1000.0000 mg | ORAL_TABLET | Freq: Four times a day (QID) | ORAL | Status: AC
  Administered 2022-08-23 – 2022-08-24 (×3): 1000 mg via ORAL
  Filled 2022-08-23 (×3): qty 2

## 2022-08-23 MED ORDER — EPHEDRINE SULFATE-NACL 50-0.9 MG/10ML-% IV SOSY
PREFILLED_SYRINGE | INTRAVENOUS | Status: DC | PRN
  Administered 2022-08-23: 10 mg via INTRAVENOUS

## 2022-08-23 MED ORDER — KETOROLAC TROMETHAMINE 15 MG/ML IJ SOLN
7.5000 mg | Freq: Four times a day (QID) | INTRAMUSCULAR | Status: AC
  Administered 2022-08-23 – 2022-08-24 (×4): 7.5 mg via INTRAVENOUS
  Filled 2022-08-23 (×3): qty 1

## 2022-08-23 MED ORDER — TRANEXAMIC ACID-NACL 1000-0.7 MG/100ML-% IV SOLN
1000.0000 mg | INTRAVENOUS | Status: AC
  Administered 2022-08-23: 1000 mg via INTRAVENOUS
  Filled 2022-08-23: qty 100

## 2022-08-23 MED ORDER — FENTANYL CITRATE (PF) 100 MCG/2ML IJ SOLN
INTRAMUSCULAR | Status: DC | PRN
  Administered 2022-08-23: 25 ug via INTRAVENOUS
  Administered 2022-08-23: 50 ug via INTRAVENOUS
  Administered 2022-08-23: 100 ug via INTRAVENOUS
  Administered 2022-08-23: 25 ug via INTRAVENOUS

## 2022-08-23 MED ORDER — FENTANYL CITRATE PF 50 MCG/ML IJ SOSY
PREFILLED_SYRINGE | INTRAMUSCULAR | Status: AC
  Filled 2022-08-23: qty 2

## 2022-08-23 MED ORDER — SODIUM CHLORIDE (PF) 0.9 % IJ SOLN
INTRAMUSCULAR | Status: AC
  Filled 2022-08-23: qty 10

## 2022-08-23 MED ORDER — ORAL CARE MOUTH RINSE: 15.0000 mL | Freq: Once | OROMUCOSAL | Status: AC

## 2022-08-23 MED ORDER — LACTATED RINGERS IV SOLN: INTRAVENOUS | Status: DC

## 2022-08-23 MED ORDER — FENTANYL CITRATE (PF) 100 MCG/2ML IJ SOLN
INTRAMUSCULAR | Status: AC
  Filled 2022-08-23: qty 2

## 2022-08-23 MED ORDER — BUPIVACAINE LIPOSOME 1.3 % IJ SUSP
INTRAMUSCULAR | Status: DC | PRN
  Administered 2022-08-23: 20 mL

## 2022-08-23 MED ORDER — METOPROLOL SUCCINATE ER 25 MG PO TB24
25.0000 mg | ORAL_TABLET | Freq: Every day | ORAL | Status: DC
  Administered 2022-08-24 – 2022-08-28 (×5): 25 mg via ORAL
  Filled 2022-08-23 (×7): qty 1

## 2022-08-23 MED ORDER — ZOLPIDEM TARTRATE 5 MG PO TABS
5.0000 mg | ORAL_TABLET | Freq: Every evening | ORAL | Status: DC | PRN
  Filled 2022-08-23: qty 1

## 2022-08-23 MED ORDER — HYDROCHLOROTHIAZIDE 25 MG PO TABS
25.0000 mg | ORAL_TABLET | Freq: Every morning | ORAL | Status: DC
  Administered 2022-08-24 – 2022-08-30 (×7): 25 mg via ORAL
  Filled 2022-08-23 (×7): qty 1

## 2022-08-23 MED ORDER — LIDOCAINE 2% (20 MG/ML) 5 ML SYRINGE
INTRAMUSCULAR | Status: DC | PRN
  Administered 2022-08-23: 100 mg via INTRAVENOUS

## 2022-08-23 MED ORDER — SENNA 8.6 MG PO TABS
1.0000 | ORAL_TABLET | Freq: Two times a day (BID) | ORAL | Status: DC
  Administered 2022-08-23 – 2022-08-30 (×12): 8.6 mg via ORAL
  Filled 2022-08-23 (×14): qty 1

## 2022-08-23 MED ORDER — ACETAMINOPHEN 500 MG PO TABS
1000.0000 mg | ORAL_TABLET | Freq: Once | ORAL | Status: DC
  Filled 2022-08-23 (×2): qty 2

## 2022-08-23 MED ORDER — HYDROMORPHONE HCL 1 MG/ML IJ SOLN
0.2500 mg | INTRAMUSCULAR | Status: DC | PRN
  Administered 2022-08-23 (×4): 0.5 mg via INTRAVENOUS

## 2022-08-23 MED ORDER — ONDANSETRON HCL 4 MG/2ML IJ SOLN
INTRAMUSCULAR | Status: DC | PRN
  Administered 2022-08-23: 4 mg via INTRAVENOUS

## 2022-08-23 MED ORDER — ADULT MULTIVITAMIN W/MINERALS CH
1.0000 | ORAL_TABLET | Freq: Every day | ORAL | Status: DC
  Administered 2022-08-24 – 2022-08-30 (×7): 1 via ORAL
  Filled 2022-08-23 (×7): qty 1

## 2022-08-23 MED ORDER — CEFAZOLIN SODIUM-DEXTROSE 2-4 GM/100ML-% IV SOLN
2.0000 g | INTRAVENOUS | Status: AC
  Administered 2022-08-23: 2 g via INTRAVENOUS
  Filled 2022-08-23: qty 100

## 2022-08-23 MED ORDER — EPHEDRINE 5 MG/ML INJ
INTRAVENOUS | Status: AC
  Filled 2022-08-23: qty 5

## 2022-08-23 MED ORDER — HYDROMORPHONE HCL 1 MG/ML IJ SOLN
INTRAMUSCULAR | Status: AC
  Filled 2022-08-23: qty 2

## 2022-08-23 MED ORDER — METHOCARBAMOL 500 MG PO TABS
500.0000 mg | ORAL_TABLET | Freq: Four times a day (QID) | ORAL | Status: DC | PRN
  Administered 2022-08-24 – 2022-08-29 (×8): 500 mg via ORAL
  Filled 2022-08-23 (×8): qty 1

## 2022-08-23 MED ORDER — LIDOCAINE HCL (PF) 2 % IJ SOLN
INTRAMUSCULAR | Status: AC
  Filled 2022-08-23: qty 5

## 2022-08-23 MED ORDER — DIPHENHYDRAMINE HCL 12.5 MG/5ML PO ELIX
12.5000 mg | ORAL_SOLUTION | ORAL | Status: DC | PRN
  Administered 2022-08-25 – 2022-08-26 (×6): 25 mg via ORAL
  Administered 2022-08-27: 12.5 mg via ORAL
  Filled 2022-08-23 (×6): qty 10
  Filled 2022-08-23: qty 5

## 2022-08-23 MED ORDER — KETOROLAC TROMETHAMINE 15 MG/ML IJ SOLN
INTRAMUSCULAR | Status: AC
  Filled 2022-08-23: qty 1

## 2022-08-23 MED ORDER — VITAMIN D 25 MCG (1000 UNIT) PO TABS
1000.0000 [IU] | ORAL_TABLET | Freq: Every day | ORAL | Status: DC
  Administered 2022-08-24 – 2022-08-30 (×7): 1000 [IU] via ORAL
  Filled 2022-08-23 (×7): qty 1

## 2022-08-23 MED ORDER — VITAMIN B-6 100 MG PO TABS
100.0000 mg | ORAL_TABLET | Freq: Every day | ORAL | Status: DC
  Administered 2022-08-24 – 2022-08-30 (×6): 100 mg via ORAL
  Filled 2022-08-23 (×7): qty 1

## 2022-08-23 MED ORDER — SPIRONOLACTONE 25 MG PO TABS
25.0000 mg | ORAL_TABLET | Freq: Every day | ORAL | Status: DC
  Administered 2022-08-24 – 2022-08-30 (×7): 25 mg via ORAL
  Filled 2022-08-23 (×9): qty 1

## 2022-08-23 MED ORDER — ROCURONIUM BROMIDE 10 MG/ML (PF) SYRINGE
PREFILLED_SYRINGE | INTRAVENOUS | Status: AC
  Filled 2022-08-23: qty 10

## 2022-08-23 MED ORDER — FENTANYL CITRATE PF 50 MCG/ML IJ SOSY
25.0000 ug | PREFILLED_SYRINGE | INTRAMUSCULAR | Status: DC | PRN
  Administered 2022-08-23 (×2): 50 ug via INTRAVENOUS

## 2022-08-23 MED ORDER — DEXAMETHASONE SODIUM PHOSPHATE 10 MG/ML IJ SOLN
INTRAMUSCULAR | Status: AC
  Filled 2022-08-23: qty 1

## 2022-08-23 MED ORDER — PROPOFOL 10 MG/ML IV BOLUS
INTRAVENOUS | Status: DC | PRN
  Administered 2022-08-23: 170 mg via INTRAVENOUS

## 2022-08-23 MED ORDER — BUPIVACAINE HCL (PF) 0.5 % IJ SOLN
INTRAMUSCULAR | Status: AC
  Filled 2022-08-23: qty 30

## 2022-08-23 MED ORDER — TRANEXAMIC ACID 1000 MG/10ML IV SOLN
2000.0000 mg | INTRAVENOUS | Status: DC
  Filled 2022-08-23: qty 20

## 2022-08-23 MED ORDER — IRBESARTAN 300 MG PO TABS
300.0000 mg | ORAL_TABLET | Freq: Every day | ORAL | Status: DC
  Administered 2022-08-23 – 2022-08-29 (×6): 300 mg via ORAL
  Filled 2022-08-23 (×3): qty 2
  Filled 2022-08-23 (×2): qty 1
  Filled 2022-08-23 (×2): qty 2

## 2022-08-23 MED ORDER — METHOCARBAMOL 500 MG IVPB - SIMPLE MED
INTRAVENOUS | Status: AC
  Filled 2022-08-23: qty 55

## 2022-08-23 MED ORDER — POVIDONE-IODINE 10 % EX SWAB: 2.0000 | Freq: Once | CUTANEOUS | Status: DC

## 2022-08-23 SURGICAL SUPPLY — 66 items
BAG COUNTER SPONGE SURGICOUNT (BAG) IMPLANT
BAG DECANTER FOR FLEXI CONT (MISCELLANEOUS) ×1 IMPLANT
BAG ZIPLOCK 12X15 (MISCELLANEOUS) ×1 IMPLANT
BLADE SAW SAG 25X90X1.19 (BLADE) IMPLANT
BLADE SAW SGTL 81X20 HD (BLADE) ×1 IMPLANT
CHLORAPREP W/TINT 26 (MISCELLANEOUS) ×2 IMPLANT
COVER SURGICAL LIGHT HANDLE (MISCELLANEOUS) ×1 IMPLANT
DERMABOND ADVANCED .7 DNX12 (GAUZE/BANDAGES/DRESSINGS) ×1 IMPLANT
DRAPE 3/4 80X56 (DRAPES) ×2 IMPLANT
DRAPE C-ARM 42X120 X-RAY (DRAPES) ×1 IMPLANT
DRAPE HIP W/POCKET STRL (MISCELLANEOUS) ×1 IMPLANT
DRAPE INCISE IOBAN 66X45 STRL (DRAPES) ×1 IMPLANT
DRAPE INCISE IOBAN 85X60 (DRAPES) ×1 IMPLANT
DRAPE POUCH INSTRU U-SHP 10X18 (DRAPES) ×1 IMPLANT
DRAPE U-SHAPE 47X51 STRL (DRAPES) ×1 IMPLANT
DRESSING AQUACEL AG SP 3.5X10 (GAUZE/BANDAGES/DRESSINGS) ×1 IMPLANT
DRESSING PEEL AND PLAC PRVNA20 (GAUZE/BANDAGES/DRESSINGS) IMPLANT
DRSG AQUACEL AG SP 3.5X10 (GAUZE/BANDAGES/DRESSINGS) ×1
DRSG PEEL AND PLACE PREVENA 20 (GAUZE/BANDAGES/DRESSINGS) ×1
ELECT BLADE TIP CTD 4 INCH (ELECTRODE) ×1 IMPLANT
ELECT NDL TIP 2.8 STRL (NEEDLE) ×1 IMPLANT
ELECT NEEDLE TIP 2.8 STRL (NEEDLE) ×1 IMPLANT
ELECT REM PT RETURN 15FT ADLT (MISCELLANEOUS) ×1 IMPLANT
GLOVE BIOGEL PI IND STRL 8 (GLOVE) ×2 IMPLANT
GLOVE SURG LX STRL 8.0 MICRO (GLOVE) ×2 IMPLANT
GLOVE SURG ORTHO 8.0 STRL STRW (GLOVE) ×1 IMPLANT
GOWN STRL REUS W/ TWL XL LVL3 (GOWN DISPOSABLE) ×1 IMPLANT
GOWN STRL REUS W/TWL XL LVL3 (GOWN DISPOSABLE) ×1
HANDPIECE INTERPULSE COAX TIP (DISPOSABLE)
HEAD FEM DLT TS CER 36X+5.0 (Head) IMPLANT
HOLDER FOLEY CATH W/STRAP (MISCELLANEOUS) ×1 IMPLANT
HOOD PEEL AWAY T7 (MISCELLANEOUS) ×3 IMPLANT
JET LAVAGE IRRISEPT WOUND (IRRIGATION / IRRIGATOR)
KIT BASIN OR (CUSTOM PROCEDURE TRAY) ×1 IMPLANT
KIT DRSG PREVENA PLUS 7DAY 125 (MISCELLANEOUS) IMPLANT
KIT TURNOVER KIT A (KITS) IMPLANT
LAVAGE JET IRRISEPT WOUND (IRRIGATION / IRRIGATOR) IMPLANT
LINER NEUTRAL 52X36MM PLUS 4 (Liner) IMPLANT
MANIFOLD NEPTUNE II (INSTRUMENTS) ×1 IMPLANT
MARKER SKIN DUAL TIP RULER LAB (MISCELLANEOUS) ×1 IMPLANT
NEEDLE HYPO 22GX1.5 SAFETY (NEEDLE) IMPLANT
NS IRRIG 1000ML POUR BTL (IV SOLUTION) ×1 IMPLANT
PACK TOTAL JOINT (CUSTOM PROCEDURE TRAY) ×1 IMPLANT
PROTECTOR NERVE ULNAR (MISCELLANEOUS) ×1 IMPLANT
RETRIEVER SUT HEWSON (MISCELLANEOUS) ×1 IMPLANT
SEALER BIPOLAR AQUA 6.0 (INSTRUMENTS) ×1 IMPLANT
SET HNDPC FAN SPRY TIP SCT (DISPOSABLE) IMPLANT
SOLUTION IRRIG SURGIPHOR (IV SOLUTION) ×1 IMPLANT
SPIKE FLUID TRANSFER (MISCELLANEOUS) ×3 IMPLANT
SUCTION FRAZIER HANDLE 12FR (TUBING) ×1
SUCTION TUBE FRAZIER 12FR DISP (TUBING) ×1 IMPLANT
SUT BONE WAX W31G (SUTURE) ×1 IMPLANT
SUT ETHIBOND #5 BRAIDED 30INL (SUTURE) ×1 IMPLANT
SUT MNCRL AB 3-0 PS2 18 (SUTURE) ×1 IMPLANT
SUT STRATAFIX 0 PDS 27 VIOLET (SUTURE) ×1
SUT STRATAFIX PDO 1 14 VIOLET (SUTURE) ×1
SUT STRATFX PDO 1 14 VIOLET (SUTURE) ×1
SUT VIC AB 2-0 CT2 27 (SUTURE) ×2 IMPLANT
SUTURE STRATFX 0 PDS 27 VIOLET (SUTURE) ×1 IMPLANT
SUTURE STRATFX PDO 1 14 VIOLET (SUTURE) ×1 IMPLANT
SYR 20ML LL LF (SYRINGE) ×2 IMPLANT
TOWEL OR 17X26 10 PK STRL BLUE (TOWEL DISPOSABLE) ×1 IMPLANT
TRAY FOLEY MTR SLVR 16FR STAT (SET/KITS/TRAYS/PACK) ×1 IMPLANT
TUBE SUCTION HIGH CAP CLEAR NV (SUCTIONS) ×1 IMPLANT
UNDERPAD 30X36 HEAVY ABSORB (UNDERPADS AND DIAPERS) ×1 IMPLANT
WATER STERILE IRR 1000ML POUR (IV SOLUTION) ×2 IMPLANT

## 2022-08-23 NOTE — Anesthesia Procedure Notes (Signed)
Procedure Name: Intubation Date/Time: 08/23/2022 1:09 PM  Performed by: Lind Covert, CRNAPre-anesthesia Checklist: Patient identified, Emergency Drugs available, Suction available, Patient being monitored and Timeout performed Patient Re-evaluated:Patient Re-evaluated prior to induction Oxygen Delivery Method: Circle system utilized Preoxygenation: Pre-oxygenation with 100% oxygen Induction Type: IV induction Ventilation: Mask ventilation without difficulty Laryngoscope Size: Mac and 4 Grade View: Grade I Tube type: Oral Tube size: 7.5 mm Number of attempts: 1 Airway Equipment and Method: Stylet Placement Confirmation: ETT inserted through vocal cords under direct vision, positive ETCO2 and breath sounds checked- equal and bilateral Secured at: 23 cm Tube secured with: Tape Dental Injury: Teeth and Oropharynx as per pre-operative assessment

## 2022-08-23 NOTE — Transfer of Care (Signed)
Immediate Anesthesia Transfer of Care Note  Patient: Norman Hudson.  Procedure(s) Performed: TOTAL HIP REVISION. HEAD AND LINER EXCHANGE. POSSIBLE CUP REVISION (Right: Hip)  Patient Location: PACU  Anesthesia Type:General  Level of Consciousness: awake, alert , and oriented  Airway & Oxygen Therapy: Patient Spontanous Breathing and Patient connected to face mask oxygen  Post-op Assessment: Report given to RN and Post -op Vital signs reviewed and stable  Post vital signs: Reviewed and stable  Last Vitals:  Vitals Value Taken Time  BP 133/73 08/23/22 1545  Temp 36.6 C 08/23/22 1544  Pulse 73 08/23/22 1547  Resp 12 08/23/22 1547  SpO2 100 % 08/23/22 1547  Vitals shown include unvalidated device data.  Last Pain:  Vitals:   08/23/22 1544  TempSrc:   PainSc: 8          Complications: No notable events documented.

## 2022-08-23 NOTE — Interval H&P Note (Signed)
The patient has been re-examined, and the chart reviewed, and there have been no interval changes to the documented history and physical.    Plan for Right hip revision possible poly exchange versus cup revision depending if there is damage to the existing cups locking mechanism based on intra-op evaluation.  The operative side was examined and the patient was confirmed to have sensation to DPN, SPN, TN intact, Motor EHL, ext, flex 5/5, and DP 2+, PT 2+, No significant edema.   The risks, benefits, and alternatives have been discussed at length with patient, and the patient is willing to proceed.  Right hip marked. Consent has been signed.

## 2022-08-23 NOTE — Anesthesia Postprocedure Evaluation (Signed)
Anesthesia Post Note  Patient: Norman Hudson.  Procedure(s) Performed: TOTAL HIP REVISION. HEAD AND LINER EXCHANGE. POSSIBLE CUP REVISION (Right: Hip)     Patient location during evaluation: PACU Anesthesia Type: General Level of consciousness: sedated and patient cooperative Pain management: pain level controlled Vital Signs Assessment: post-procedure vital signs reviewed and stable Respiratory status: spontaneous breathing Cardiovascular status: stable Anesthetic complications: no   No notable events documented.  Last Vitals:  Vitals:   08/23/22 1715 08/23/22 1730  BP: 113/71 116/63  Pulse: 66 65  Resp: 14 13  Temp:    SpO2: 98% 97%    Last Pain:  Vitals:   08/23/22 1730  TempSrc:   PainSc: [place]

## 2022-08-23 NOTE — Op Note (Addendum)
08/23/2022  3:08 PM  PATIENT:  Norman Hudson.   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 is an 72 y.o. 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  X-rays demonstrated superior migration of the head in the socket concerning for damage or displacement of the liner.  Concern for articulation of the ceramic head on the metal liner revision was indicated.  The risks benefits and alternatives were discussed with the patient including but not limited to the risks of nonoperative treatment, versus surgical intervention including infection, bleeding, nerve injury, periprosthetic fracture, the need for revision surgery, dislocation, leg length discrepancy, blood clots, cardiopulmonary complications, morbidity, mortality, among others, and they were willing to proceed.     OPERATIVE REPORT     SURGEON:  [name], MD    ASSISTANT: Izola Price, RNFA, (Present throughout the entire procedure,  necessary for completion of procedure in a timely manner, assisting with retraction, instrumentation, and closure)     ANESTHESIA: General  ESTIMATED BLOOD LOSS: 323FT    COMPLICATIONS:  None.     UNIQUE ASPECTS OF THE CASE: Significant synovial lining metal corrosion without necrosis of the abductors or surrounding musculature.  Broken +4 10 degree polyethylene liner which was displaced in the socket.  Well-fixed femoral and acetabular components.  No damage to existing locking mechanism of the acetabular socket.  COMPONENTS:   +4 neutral liner for 52 acetabular shell 36 mm inner diameter.  36+5 ceramic head with titanium sleeve. Implant Name Type Inv. Item Serial No.  Manufacturer Lot No. LRB No. Used Action  LINER NEUTRAL [id number] PLUS 4 - [id number] Liner LINER NEUTRAL 52X36MM PLUS 4  [employer] [id number] Right 1 Implanted  HEAD FEM DLT TS CER 36X+5.0 - [id number] Head HEAD FEM DLT TS CER 36X+5.0  [employer] [id number] Right 1 Implanted      PROCEDURE IN DETAIL:   The patient was met in the holding area and  identified.  The appropriate hip was identified and marked at the operative site.  The patient was then transported to the OR  and  placed under anesthesia.  At that point, the patient was  placed in the lateral decubitus position with the operative side up and  secured to the operating room table  and all bony prominences padded. A subaxillary role was also placed.    The operative lower extremity was prepped from the iliac crest to the distal leg.  Sterile draping was performed.  Preoperative antibiotics, 2 gm of ancef,1 gm of Tranexamic Acid, and 8 mg of Decadron administered. Time out was performed prior to incision.      A routine posterolateral approach was utilized via sharp dissection through the patient's old incision which was extended slightly proximally and distally.  Dissection was carried down to the subcutaneous tissue.  Gross bleeders were Bovie coagulated.  The iliotibial band was identified and incised along the length of the skin incision through the glute max fascia.  Ethibond suture from old IT band fascia closure was identified and these were incised and removed.  Upon incision of the IT band and fascia significant metal staining of the synovial lining as well as black-tinged fluid was expressed from the joint.  No evidence of purulence.  No presence of muscle necrosis.  Charnley retractor was placed with care to protect the sciatic nerve posteriorly.  With the hip internally rotated capsulotomy was performed off the femoral insertion exposing the hip joint.  Using a bone tamp the ceramic head was easily disimpacted.  Compartment  confirmed to be a 36+5 femoral head.  There was no damage to the femoral stem trunnion.  The femoral stem was evaluated felt to be well-fixed.  In about 25 degrees of anteversion.  I then exposed the acetabulum.  The liner was seen to be in the cup it was displaced and no longer locked into the locking mechanism.  There is also fragmentation of the posterior lip of the polyethylene liner.  Circumferential debridement was performed around the acetabulum.  3 synovial tissue specimens were sent for routine aerobic and anaerobic culture.  The acetabular shell was felt to be in about 40 degrees of inclination and 30 degrees of anteversion.  There was some inferior and inferior anterior osteophyte noted a curved osteotome was used to remove this bone and tissue so that it would not get stuck within the locking mechanism.  A dental mirror was also used to fully visualize the acetabular shell.  There was small area of very thin partial wear noted on the superior dome of the acetabulum but without appreciable damage to the locking mechanism.  A neutral liner was first trialed and felt to have good stability.  Notably with extension and external rotation although the neck of the stem did not impinge on the acetabular liner it was very close.  Next we also trialed a +4 neutral liner with a +5 head.   There was no impingement with full extension and 90 degrees external rotation.  The hip was stable at the position of sleep and with 90 degrees flexion and 75 degrees of internal rotation.  Leg lengths were also clinically assessed in the lateral position and felt to be equal.   A +4 neutral liner was selected and impacted.  It was inspected circumferentially to make sure there was no interposed soft tissue and that it was fully seated.  Using a hemostat attempted to pull the liner back and it seemed well-fixed and well-seated.  A 36+5 mm ceramic ball with titanium sleeve was then opened and impacted onto a clean dry  trunnion.  Thorough debridement was performed of all the metal stained synovial lining.  Confirmed that there was no paralytic on board to make sure the sciatic nerve was safe while resecting the posterior capsular tissue.  The posterior capsule was then closed with #2 Ethibond.     I then irrigated the hip copiously with surgery for Betadine irrigation and with normal saline pulse lavage. Periarticular injection was then performed with Exparel.   We repaired the fascia #1 barbed suture, followed by 0 barbed suture for the subcutaneous fat.  Skin was closed with 2-0 Vicryl and 3-0 nylon.  Dermabond and Aquacel dressing were applied. The patient was then awakened and returned to PACU in stable and satisfactory condition.  Leg lengths in the supine position were assessed and felt to be clinically equal. There were no complications.  Post op recs: WB: WBAT RLE, posterior hip precautions x 6 weeks given revision surgery Abx: ancef 2g q8 post op pending final cxs, anticipate discharge on cefadroxil '500mg'$  BID x 7 days. Imaging: PACU pelvis Xray Dressing: Prevena wound VAC intact until 1 week postop. DVT prophylaxis: Eliquis 2.5 twice daily postop day 1 and postop  day 2, Eliquis 5 mg twice daily starting postop day 3 Follow up: 1 week after surgery for wound check..  Address: [address] [place], [place], [place] [address]  Office Phone: [phone]   [name], MD Orthopedic Surgeon

## 2022-08-23 NOTE — Discharge Instructions (Addendum)
INSTRUCTIONS AFTER JOINT REPLACEMENT   Remove items at home which could result in a fall. This includes throw rugs or furniture in walking pathways ICE to the affected joint every three hours while awake for 30 minutes at a time, for at least the first 3-5 days, and then as needed for pain and swelling.  Continue to use ice for pain and swelling. You may notice swelling that will progress down to the foot and ankle.  This is normal after surgery.  Elevate your leg when you are not up walking on it.   Continue to use the breathing machine you got in the hospital (incentive spirometer) which will help keep your temperature down.  It is common for your temperature to cycle up and down following surgery, especially at night when you are not up moving around and exerting yourself.  The breathing machine keeps your lungs expanded and your temperature down.   DIET:  As you were doing prior to hospitalization, we recommend a well-balanced diet.  DRESSING / WOUND CARE / SHOWERING  Keep the surgical dressing until follow up.  The dressing is water proof, so you can shower without any extra covering.  IF THE DRESSING FALLS OFF or the wound gets wet inside, change the dressing with sterile gauze.  Please use good hand washing techniques before changing the dressing.  Do not use any lotions or creams on the incision until instructed by your surgeon.    ACTIVITY  Increase activity slowly as tolerated, but follow the weight bearing instructions below.   No driving for 6 weeks or until further direction given by your physician.  You cannot drive while taking narcotics.  No lifting or carrying greater than 10 lbs. until further directed by your surgeon. Avoid periods of inactivity such as sitting longer than an hour when not asleep. This helps prevent blood clots.  You may return to work once you are authorized by your doctor.     WEIGHT BEARING   Weight bearing as tolerated with assist device (walker, cane,  etc) as directed, use it as long as suggested by your surgeon or therapist, typically at least 4-6 weeks.   EXERCISES  Results after joint replacement surgery are often greatly improved when you follow the exercise, range of motion and muscle strengthening exercises prescribed by your doctor. Safety measures are also important to protect the joint from further injury. Any time any of these exercises cause you to have increased pain or swelling, decrease what you are doing until you are comfortable again and then slowly increase them. If you have problems or questions, call your caregiver or physical therapist for advice.   Rehabilitation is important following a joint replacement. After just a few days of immobilization, the muscles of the leg can become weakened and shrink (atrophy).  These exercises are designed to build up the tone and strength of the thigh and leg muscles and to improve motion. Often times heat used for twenty to thirty minutes before working out will loosen up your tissues and help with improving the range of motion but do not use heat for the first two weeks following surgery (sometimes heat can increase post-operative swelling).   These exercises can be done on a training (exercise) mat, on the floor, on a table or on a bed. Use whatever works the best and is most comfortable for you.    Use music or television while you are exercising so that the exercises are a pleasant break in your  day. This will make your life better with the exercises acting as a break in your routine that you can look forward to.   Perform all exercises about fifteen times, three times per day or as directed.  You should exercise both the operative leg and the other leg as well.  Exercises include:   Quad Sets - Tighten up the muscle on the front of the thigh (Quad) and hold for 5-10 seconds.   Straight Leg Raises - With your knee straight (if you were given a brace, keep it on), lift the leg to 60  degrees, hold for 3 seconds, and slowly lower the leg.  Perform this exercise against resistance later as your leg gets stronger.  Leg Slides: Lying on your back, slowly slide your foot toward your buttocks, bending your knee up off the floor (only go as far as is comfortable). Then slowly slide your foot back down until your leg is flat on the floor again.  Angel Wings: Lying on your back spread your legs to the side as far apart as you can without causing discomfort.  Hamstring Strength:  Lying on your back, push your heel against the floor with your leg straight by tightening up the muscles of your buttocks.  Repeat, but this time bend your knee to a comfortable angle, and push your heel against the floor.  You may put a pillow under the heel to make it more comfortable if necessary.   A rehabilitation program following joint replacement surgery can speed recovery and prevent re-injury in the future due to weakened muscles. Contact your doctor or a physical therapist for more information on knee rehabilitation.    CONSTIPATION  Constipation is defined medically as fewer than three stools per week and severe constipation as less than one stool per week.  Even if you have a regular bowel pattern at home, your normal regimen is likely to be disrupted due to multiple reasons following surgery.  Combination of anesthesia, postoperative narcotics, change in appetite and fluid intake all can affect your bowels.   YOU MUST use at least one of the following options; they are listed in order of increasing strength to get the job done.  They are all available over the counter, and you may need to use some, POSSIBLY even all of these options:    Drink plenty of fluids (prune juice may be helpful) and high fiber foods Colace 100 mg by mouth twice a day  Senokot for constipation as directed and as needed Dulcolax (bisacodyl), take with full glass of water  Miralax (polyethylene glycol) once or twice a day as  needed.  If you have tried all these things and are unable to have a bowel movement in the first 3-4 days after surgery call either your surgeon or your primary doctor.    If you experience loose stools or diarrhea, hold the medications until you stool forms back up.  If your symptoms do not get better within 1 week or if they get worse, check with your doctor.  If you experience "the worst abdominal pain ever" or develop nausea or vomiting, please contact the office immediately for further recommendations for treatment.   ITCHING:  If you experience itching with your medications, try taking only a single pain pill, or even half a pain pill at a time.  You can also use Benadryl over the counter for itching or also to help with sleep.   TED HOSE STOCKINGS:  Use stockings on both  legs until for at least 2 weeks or as directed by physician office. They may be removed at night for sleeping.  MEDICATIONS:  See your medication summary on the "After Visit Summary" that nursing will review with you.  You may have some home medications which will be placed on hold until you complete the course of blood thinner medication.  It is important for you to complete the blood thinner medication as prescribed.   Blood clot prevention (DVT Prophylaxis): After surgery you are at an increased risk for a blood clot. you will resume your Eliquis to help reduce your risk of getting a blood clot. This will help prevent a blood clot. Signs of a pulmonary embolus (blood clot in the lungs) include sudden short of breath, feeling lightheaded or dizzy, chest pain with a deep breath, rapid pulse rapid breathing. Signs of a blood clot in your arms or legs include new unexplained swelling and cramping, warm, red or darkened skin around the painful area. Please call the office or 911 right away if these signs or symptoms develop.  PRECAUTIONS:  If you experience chest pain or shortness of breath - call 911 immediately for transfer to  the hospital emergency department.   If you develop a fever greater that 101 F, purulent drainage from wound, increased redness or drainage from wound, foul odor from the wound/dressing, or calf pain - CONTACT YOUR SURGEON.                                                   FOLLOW-UP APPOINTMENTS:  If you do not already have a post-op appointment, please call the office for an appointment to be seen by your surgeon.  Guidelines for how soon to be seen are listed in your "After Visit Summary", but are typically between 2-3 weeks after surgery.  OTHER INSTRUCTIONS:   POST-OPERATIVE OPIOID TAPER INSTRUCTIONS: It is important to wean off of your opioid medication as soon as possible. If you do not need pain medication after your surgery it is ok to stop day one. Opioids include: Codeine, Hydrocodone(Norco, Vicodin), Oxycodone(Percocet, oxycontin) and hydromorphone amongst others.  Long term and even short term use of opiods can cause: Increased pain response Dependence Constipation Depression Respiratory depression And more.  Withdrawal symptoms can include Flu like symptoms Nausea, vomiting And more Techniques to manage these symptoms Hydrate well Eat regular healthy meals Stay active Use relaxation techniques(deep breathing, meditating, yoga) Do Not substitute Alcohol to help with tapering If you have been on opioids for less than two weeks and do not have pain than it is ok to stop all together.  Plan to wean off of opioids This plan should start within one week post op of your joint replacement. Maintain the same interval or time between taking each dose and first decrease the dose.  Cut the total daily intake of opioids by one tablet each day Next start to increase the time between doses. The last dose that should be eliminated is the evening dose.   MAKE SURE YOU:  Understand these instructions.  Get help right away if you are not doing well or get worse.    Thank you for  letting us be a part of your medical care team.  It is a privilege we respect greatly.  We hope these instructions will help you stay on  track for a fast and full recovery!

## 2022-08-24 ENCOUNTER — Encounter (HOSPITAL_COMMUNITY): Payer: Self-pay | Admitting: Orthopedic Surgery

## 2022-08-24 LAB — BASIC METABOLIC PANEL
Anion gap: 10 (ref 5–15)
BUN: 25 mg/dL — ABNORMAL HIGH (ref 8–23)
CO2: 22 mmol/L (ref 22–32)
Calcium: 8.2 mg/dL — ABNORMAL LOW (ref 8.9–10.3)
Chloride: 101 mmol/L (ref 98–111)
Creatinine, Ser: 1.06 mg/dL (ref 0.61–1.24)
GFR, Estimated: >60 mL/min (ref >60)
Glucose, Bld: 179 mg/dL — ABNORMAL HIGH (ref 70–99)
Potassium: 4 mmol/L (ref 3.5–5.1)
Sodium: 133 mmol/L — ABNORMAL LOW (ref 135–145)

## 2022-08-24 LAB — CBC
HCT: 29.2 % — ABNORMAL LOW (ref 39.0–52.0)
Hemoglobin: 9.9 g/dL — ABNORMAL LOW (ref 13.0–17.0)
MCH: 35.2 pg — ABNORMAL HIGH (ref 26.0–34.0)
MCHC: 33.9 g/dL (ref 30.0–36.0)
MCV: 103.9 fL — ABNORMAL HIGH (ref 80.0–100.0)
Platelets: 141 10*3/uL — ABNORMAL LOW (ref 150–400)
RBC: 2.81 MIL/uL — ABNORMAL LOW (ref 4.22–5.81)
RDW: 12.2 % (ref 11.5–15.5)
WBC: 12.1 10*3/uL — ABNORMAL HIGH (ref 4.0–10.5)
nRBC: 0 % (ref 0.0–0.2)

## 2022-08-24 MED ORDER — APIXABAN 5 MG PO TABS: 5.0000 mg | ORAL_TABLET | Freq: Two times a day (BID) | ORAL | Status: DC

## 2022-08-24 MED ORDER — CHLORHEXIDINE GLUCONATE CLOTH 2 % EX PADS
6.0000 | MEDICATED_PAD | Freq: Every day | CUTANEOUS | Status: DC
  Administered 2022-08-24 – 2022-08-27 (×4): 6 via TOPICAL

## 2022-08-24 MED ORDER — APIXABAN 2.5 MG PO TABS
2.5000 mg | ORAL_TABLET | Freq: Two times a day (BID) | ORAL | Status: AC
  Administered 2022-08-24 – 2022-08-25 (×4): 2.5 mg via ORAL
  Filled 2022-08-24 (×4): qty 1

## 2022-08-24 NOTE — Progress Notes (Signed)
Physical Therapy Treatment Patient Details Name: Norman Hudson. MRN: [id number] DOB: 18-Feb-1951 Today's Date: 08/24/2022   History of Present Illness 72 yo male S/P  revision RTHAon 08/23/22,PMH LTHA LUmbar lam, TIA    PT Comments     Patient reports that he is exhausted, ready to return to bed. Ambulated x 50' with RW, no reports of dizziness.  Continue PT in AM.  Recommendations for follow up therapy are one component of a multi-disciplinary discharge planning process, led by the attending physician.  Recommendations may be updated based on patient status, additional functional criteria and insurance authorization.  Follow Up Recommendations  Follow physician's recommendations for discharge plan and follow up therapies     Assistance Recommended at Discharge Intermittent Supervision/Assistance  Patient can return home with the following A little help with walking and/or transfers;Help with stairs or ramp for entrance;A little help with bathing/dressing/bathroom;Assist for transportation   Equipment Recommendations  None recommended by PT    Recommendations for Other Services       Precautions / Restrictions Precautions Precautions: Posterior Hip;Fall Precaution Comments: VAC right hip Restrictions Weight Bearing Restrictions: No     Mobility  Bed Mobility Overal bed mobility: Needs Assistance Bed Mobility: Sit to Supine     Supine to sit: Min assist Sit to supine: Min assist   General bed mobility comments: support right leg    Transfers Overall transfer level: Needs assistance Equipment used: Rolling walker (2 wheels) Transfers: Sit to/from Stand Sit to Stand: Supervision           General transfer comment: cues for safety    Ambulation/Gait Ambulation/Gait assistance: Min guard Gait Distance (Feet): 50 Feet Assistive device: Rolling walker (2 wheels) Gait Pattern/deviations: Step-to pattern, Step-through pattern       General Gait  Details: cues for sequence, patient reported dizziness at end of distance.   Stairs             Wheelchair Mobility    Modified Rankin (Stroke Patients Only)       Balance Overall balance assessment: Needs assistance Sitting-balance support: Bilateral upper extremity supported, Feet supported Sitting balance-Leahy Scale: Good     Standing balance support: During functional activity, Bilateral upper extremity supported, Reliant on assistive device for balance Standing balance-Leahy Scale: Fair                              Cognition Arousal/Alertness: Awake/alert                                              Exercises      General Comments        Pertinent Vitals/Pain Pain Assessment Pain Assessment: 0-10 Pain Score: 2  Pain Location: right hip Pain Descriptors / Indicators: Discomfort Pain Intervention(s): Premedicated before session    Home Living Family/patient expects to be discharged to:: Private residence Living Arrangements: Spouse/significant other;Children;Other relatives Available Help at Discharge: Family;Available 24 hours/day Type of Home: House Home Access: Stairs to enter   [employer] of Steps: 1 + 1/2 then 1+ 1/2   Home Layout: One level Home Equipment: Agricultural consultant (2 wheels);Rollator (4 wheels);BSC/3in1      Prior Function            PT Goals (current goals can now be found in the care plan  section) Acute Rehab PT Goals Patient Stated Goal: go home PT Goal Formulation: With patient Time For Goal Achievement: 08/31/22 Potential to Achieve Goals: Good Progress towards PT goals: Progressing toward goals    Frequency    7X/week      PT Plan Current plan remains appropriate    Co-evaluation              AM-PAC PT "6 Clicks" Mobility   Outcome Measure  Help needed turning from your back to your side while in a flat bed without using bedrails?: A Little Help needed  moving from lying on your back to sitting on the side of a flat bed without using bedrails?: A Little Help needed moving to and from a bed to a chair (including a wheelchair)?: A Little Help needed standing up from a chair using your arms (e.g., wheelchair or bedside chair)?: A Little Help needed to walk in hospital room?: A Little Help needed climbing 3-5 steps with a railing? : A Little 6 Click Score: 18    End of Session Equipment Utilized During Treatment: Gait belt Activity Tolerance: Patient tolerated treatment well Patient left: in bed;with bed alarm set;with call bell/phone within reach Nurse Communication: Mobility status PT Visit Diagnosis: Unsteadiness on feet (R26.81);Difficulty in walking, not elsewhere classified (R26.2);Pain Pain - Right/Left: Right Pain - part of body: Hip     Time: 5284-1324 PT Time Calculation (min) (ACUTE ONLY): 12 min  Charges:  $Gait Training: 8-22 mins                     Blanchard Kelch PT Acute Rehabilitation Services Office [phone] Weekend [phone]   Rada Hay 08/24/2022, 3:47 PM

## 2022-08-24 NOTE — Evaluation (Signed)
Physical Therapy Evaluation Patient Details Name: Norman Hudson. MRN: [id number] DOB: 1950-10-04 Today's Date: 08/24/2022  History of Present Illness  72 yo male S/P  revision RTHAon 08/23/22,PMH LTHA LUmbar lam, TIA  Clinical Impression  Pt admitted with above diagnosis.  Pt currently with functional limitations due to the deficits listed below (see PT Problem List). Pt will benefit from skilled PT to increase their independence and safety with mobility to allow discharge to the venue listed below.     The patient is eager to ambulate. Patient reported feeling dizzy after ambulating x 50' using RW.  BP 100/58 RN notified.   Patient should progress to return home with family support     Recommendations for follow up therapy are one component of a multi-disciplinary discharge planning process, led by the attending physician.  Recommendations may be updated based on patient status, additional functional criteria and insurance authorization.  Follow Up Recommendations Follow physician's recommendations for discharge plan and follow up therapies      Assistance Recommended at Discharge Intermittent Supervision/Assistance  Patient can return home with the following  A little help with walking and/or transfers;Help with stairs or ramp for entrance;A little help with bathing/dressing/bathroom;Assist for transportation    Equipment Recommendations None recommended by PT  Recommendations for Other Services       Functional Status Assessment Patient has had a recent decline in their functional status and demonstrates the ability to make significant improvements in function in a reasonable and predictable amount of time.     Precautions / Restrictions Precautions Precautions: Posterior Hip;Fall Precaution Comments: VAC right hip Restrictions Weight Bearing Restrictions: No      Mobility  Bed Mobility Overal bed mobility: Needs Assistance Bed Mobility: Supine to Sit      Supine to sit: Min assist     General bed mobility comments: cues for post. precautions    Transfers Overall transfer level: Needs assistance Equipment used: Rolling walker (2 wheels) Transfers: Sit to/from Stand Sit to Stand: Min assist           General transfer comment: cues for  right leg position and hands    Ambulation/Gait Ambulation/Gait assistance: Min assist, +2 safety/equipment Gait Distance (Feet): 50 Feet Assistive device: Rolling walker (2 wheels) Gait Pattern/deviations: Step-to pattern, Step-through pattern       General Gait Details: cues for sequence, patient reported dizziness at end of distance.  Stairs            Wheelchair Mobility    Modified Rankin (Stroke Patients Only)       Balance Overall balance assessment: Needs assistance Sitting-balance support: Bilateral upper extremity supported, Feet supported Sitting balance-Leahy Scale: Good     Standing balance support: During functional activity, Bilateral upper extremity supported, Reliant on assistive device for balance Standing balance-Leahy Scale: Fair                               Pertinent Vitals/Pain Pain Assessment Pain Assessment: 0-10 Pain Score: 4  Pain Location: right hip    Home Living Family/patient expects to be discharged to:: Private residence Living Arrangements: Spouse/significant other;Children;Other relatives Available Help at Discharge: Family;Available 24 hours/day Type of Home: House Home Access: Stairs to enter   [employer] of Steps: 1 + 1/2 then 1+ 1/2   Home Layout: One level Home Equipment: Agricultural consultant (2 wheels);Rollator (4 wheels);BSC/3in1      Prior Function Prior Level of Function :  Needs assist       Physical Assist : Mobility (physical) Mobility (physical): Stairs   Mobility Comments: used cane and Rw PTA, family assiste on steps       Hand Dominance   Dominant Hand: Right    Extremity/Trunk  Assessment   Upper Extremity Assessment Upper Extremity Assessment: Overall WFL for tasks assessed    Lower Extremity Assessment Lower Extremity Assessment: RLE deficits/detail RLE Deficits / Details: able to flex to 50 in supine    Cervical / Trunk Assessment Cervical / Trunk Assessment: Normal  Communication   Communication: [facility]  Cognition                                                General Comments      Exercises     Assessment/Plan    PT Assessment Patient needs continued PT services  PT Problem List Decreased strength;Decreased knowledge of precautions;Decreased range of motion;Decreased mobility;Decreased knowledge of use of DME;Decreased activity tolerance;Decreased safety awareness       PT Treatment Interventions DME instruction;Therapeutic activities;Gait training;Therapeutic exercise;Patient/family education;Functional mobility training;Stair training    PT Goals (Current goals can be found in the Care Plan section)  Acute Rehab PT Goals Patient Stated Goal: go home PT Goal Formulation: With patient Time For Goal Achievement: 08/31/22 Potential to Achieve Goals: Good    Frequency 7X/week     Co-evaluation               AM-PAC PT "6 Clicks" Mobility  Outcome Measure Help needed turning from your back to your side while in a flat bed without using bedrails?: A Little Help needed moving from lying on your back to sitting on the side of a flat bed without using bedrails?: A Little Help needed moving to and from a bed to a chair (including a wheelchair)?: A Little Help needed standing up from a chair using your arms (e.g., wheelchair or bedside chair)?: A Little Help needed to walk in hospital room?: A Little Help needed climbing 3-5 steps with a railing? : A Little 6 Click Score: 18    End of Session Equipment Utilized During Treatment: Gait belt Activity Tolerance: Patient tolerated treatment well Patient left: in  chair;with call bell/phone within reach;with chair alarm set Nurse Communication: Mobility status PT Visit Diagnosis: Unsteadiness on feet (R26.81);Difficulty in walking, not elsewhere classified (R26.2);Pain Pain - Right/Left: Right Pain - part of body: Hip    Time: 1133-1200 PT Time Calculation (min) (ACUTE ONLY): 27 min   Charges:   PT Evaluation $PT Eval Low Complexity: 1 Low PT Treatments $Gait Training: 8-22 mins        Blanchard Kelch PT Acute Rehabilitation Services Office [phone] Weekend [phone]   Rada Hay 08/24/2022, 3:43 PM

## 2022-08-24 NOTE — Progress Notes (Signed)
     Subjective: Patient reports pain as mild.  Denies distal n/t. Eager to work with PT today. Hopeful to go home tomorrow.  Objective:   VITALS:   Vitals:   08/23/22 2133 08/23/22 2235 08/24/22 0221 08/24/22 0520  BP: 124/71 128/68 123/76 123/73  Pulse: 64 68 72 79  Resp: '17 18 16 16  '$ Temp: 97.9 F (36.6 C) 97.8 F (36.6 C) 98.7 F (37.1 C) 98.1 F (36.7 C)  TempSrc: Oral Oral Oral Oral  SpO2: 99% 100% 100% 100%  Weight:      Height:        Sensation intact distally Intact pulses distally Dorsiflexion/Plantar flexion intact Incision: dressing C/D/I Compartment soft Wound vac holding suction 177mHg continuous  Lab Results  Component Value Date   WBC 12.1 (H) 08/24/2022   HGB 9.9 (L) 08/24/2022   HCT 29.2 (L) 08/24/2022   MCV 103.9 (H) 08/24/2022   PLT 141 (L) 08/24/2022   BMET    Component Value Date/Time   NA 133 (L) 08/24/2022 0431   NA 140 09/04/2021 1448   K 4.0 08/24/2022 0431   CL 101 08/24/2022 0431   CO2 22 08/24/2022 0431   GLUCOSE 179 (H) 08/24/2022 0431   BUN 25 (H) 08/24/2022 0431   BUN 17 09/04/2021 1448   CREATININE 1.06 08/24/2022 0431   CALCIUM 8.2 (L) 08/24/2022 0431   EGFR 89 09/04/2021 1448   GFRNONAA >60 08/24/2022 0431    Xray: xrays THA components in good position without adverse features  Assessment/Plan: 1 Day Post-Op   Principal Problem:   Prosthetic joint implant failure, [place]  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 Imaging: PACU pelvis Xray Dressing: Prevena wound VAC intact until 1 week postop. DVT prophylaxis: Eliquis 2.5 twice daily postop day 1 and postop day 2, Eliquis 5 mg twice daily starting postop day 3 Follow up: 1 week after surgery for wound check..  [address] [place] [address]  Office Phone: [phone]   Lamondre Wesche A Rustin Erhart 08/24/2022, 7:05 AM   DCharlies Constable MD  Contact information:   [phone]-5pm epic message Dr. MZachery Dakins or call office for patient follow up: [phone] After hours and holidays please check [email or web address] for group call information for [facility]

## 2022-08-24 NOTE — TOC Transition Note (Signed)
Transition of Care [facility]) - CM/SW Discharge Note  Patient Details  Name: Norman Hudson. MRN: [id number] Date of Birth: 1951-07-21  Transition of Care [facility] CM/SW Contact:  Sherie Don, LCSW Phone Number: 08/24/2022, 9:46 AM  Clinical Narrative: Patient is expected to discharge home after working with PT. CSW met with patient to confirm discharge plan. Patient will go home with HHPT, which was prearranged with [place]. Patient has a rolling walker, cane, and BSC at home so there are no DME needs at this time. TOC signing off.    Final next level of care: Home w Home Health Services Barriers to Discharge: No Barriers Identified  Patient Goals and CMS Choice CMS Medicare.gov Compare Post Acute Care list provided to:: Patient Choice offered to / list presented to : Patient  Discharge Plan and Services Additional resources added to the After Visit Summary for            DME Arranged: N/A DME Agency: NA HH Arranged: PT HH Agency: [facility] Representative spoke with at [place] in orthopedist's office  Social Determinants of Health ([place]) Interventions SDOH Screenings   Food Insecurity: No Food Insecurity (08/23/2022)  Housing: Low Risk  (08/23/2022)  Tobacco Use: Medium Risk (08/23/2022)   Readmission Risk Interventions     No data to display

## 2022-08-25 LAB — CBC
HCT: 21.6 % — ABNORMAL LOW (ref 39.0–52.0)
Hemoglobin: 7.6 g/dL — ABNORMAL LOW (ref 13.0–17.0)
MCH: 35.7 pg — ABNORMAL HIGH (ref 26.0–34.0)
MCHC: 35.2 g/dL (ref 30.0–36.0)
MCV: 101.4 fL — ABNORMAL HIGH (ref 80.0–100.0)
Platelets: 122 10*3/uL — ABNORMAL LOW (ref 150–400)
RBC: 2.13 MIL/uL — ABNORMAL LOW (ref 4.22–5.81)
RDW: 12.6 % (ref 11.5–15.5)
WBC: 9.1 10*3/uL (ref 4.0–10.5)
nRBC: 0 % (ref 0.0–0.2)

## 2022-08-25 NOTE — Progress Notes (Signed)
     Subjective: Patient is doing very well this morning.  Pleased with his progress and pain control to this point.  Did well with physical therapy yesterday.  Denies chest pain or shortness of breath.  Discussed drop in hemoglobin this morning to 7.6.  Probably dilutional given that he has been on IV fluids.  Will hold IV fluids and encourage p.o. intake.  Encourage mobility today. Will reassess hemoglobin tomorrow morning if stable and this will should be safe for discharge.  Will continue low dose Eliquis for now.  Objective:   VITALS:   Vitals:   08/24/22 1708 08/24/22 1821 08/24/22 2003 08/25/22 0544  BP: (!) 137/96 (!) 147/80 116/62 108/62  Pulse: 82  94 (!) 108  Resp: '18  18 16  '$ Temp: (!) 97.4 F (36.3 C)  98.7 F (37.1 C) 98.5 F (36.9 C)  TempSrc: Oral  Oral Oral  SpO2: 100%  99% 97%  Weight:      Height:        Sensation intact distally Intact pulses distally Dorsiflexion/Plantar flexion intact Incision: dressing C/D/I Compartment soft Wound vac holding suction 149mHg continuous  Lab Results  Component Value Date   WBC 9.1 08/25/2022   HGB 7.6 (L) 08/25/2022   HCT 21.6 (L) 08/25/2022   MCV 101.4 (H) 08/25/2022   PLT 122 (L) 08/25/2022   BMET    Component Value Date/Time   NA 133 (L) 08/24/2022 0431   NA 140 09/04/2021 1448   K 4.0 08/24/2022 0431   CL 101 08/24/2022 0431   CO2 22 08/24/2022 0431   GLUCOSE 179 (H) 08/24/2022 0431   BUN 25 (H) 08/24/2022 0431   BUN 17 09/04/2021 1448   CREATININE 1.06 08/24/2022 0431   CALCIUM 8.2 (L) 08/24/2022 0431   EGFR 89 09/04/2021 1448   GFRNONAA >60 08/24/2022 0431    Xray: xrays THA components in good position without adverse features  Assessment/Plan: 2 Days Post-Op   Principal Problem:   Prosthetic joint implant failure, [place] encounter [place]  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 Imaging: PACU pelvis Xray. Cxs NGTD Dressing: Prevena wound VAC intact until 1 week postop. DVT prophylaxis: Eliquis 2.5 twice daily postop day 1 and postop day 2, Eliquis 5 mg twice daily starting postop day 3 Follow up: 1 week after surgery for wound check..  [address] [place] [address] Office Phone: [phone]   DWillaim Sheng1/17/2024, 7:18 AM   DCharlies Constable MD  Contact information:   [phone]-5pm epic message Dr. MZachery Dakins or call office for patient follow up: [phone] After hours and holidays please check [email or web address] for group call information for [facility]

## 2022-08-25 NOTE — Progress Notes (Signed)
Physical Therapy Treatment Patient Details Name: Norman Hudson. MRN: [id number] DOB: Nov 03, 1950 Today's Date: 08/25/2022   History of Present Illness 72 yo male S/P  revision RTHAon 08/23/22,PMH LTHA LUmbar lam, TIA    PT Comments    Pt tolerated increased ambulation distance of 58' with RW, distance limited by pain. Performed RLE exercises with supervision.    Recommendations for follow up therapy are one component of a multi-disciplinary discharge planning process, led by the attending physician.  Recommendations may be updated based on patient status, additional functional criteria and insurance authorization.  Follow Up Recommendations  Follow physician's recommendations for discharge plan and follow up therapies     Assistance Recommended at Discharge Intermittent Supervision/Assistance  Patient can return home with the following A little help with walking and/or transfers;Help with stairs or ramp for entrance;A little help with bathing/dressing/bathroom;Assist for transportation   Equipment Recommendations  None recommended by PT    Recommendations for Other Services       Precautions / Restrictions Precautions Precautions: Posterior Hip;Fall Precaution Booklet Issued: Yes (comment) Precaution Comments: VAC right hip; reviewed posterior precautions in detail, sign hung on door, handout issued to pt Restrictions Weight Bearing Restrictions: No     Mobility  Bed Mobility Overal bed mobility: Modified Independent Bed Mobility: Supine to Sit     Supine to sit: Modified independent (Device/Increase time), HOB elevated     General bed mobility comments: up in recliner    Transfers Overall transfer level: Needs assistance Equipment used: Rolling walker (2 wheels) Transfers: Sit to/from Stand Sit to Stand: Min guard, Min assist, From elevated surface           General transfer comment: VCs hand placement, min/guard to stand, min A to control descent  stand to sit    Ambulation/Gait Ambulation/Gait assistance: Min guard Gait Distance (Feet): 65 Feet Assistive device: Rolling walker (2 wheels) Gait Pattern/deviations: Step-to pattern, Step-through pattern, Decreased step length - right, Decreased step length - left Gait velocity: decr     General Gait Details: VCs sequencing, no dizziness, no loss of balance, distance limited by pain   Stairs             Wheelchair Mobility    Modified Rankin (Stroke Patients Only)       Balance Overall balance assessment: Needs assistance Sitting-balance support: Bilateral upper extremity supported, Feet supported Sitting balance-Leahy Scale: Good     Standing balance support: During functional activity, Bilateral upper extremity supported, Reliant on assistive device for balance Standing balance-Leahy Scale: Fair                              Cognition Arousal/Alertness: Awake/alert Behavior During Therapy: WFL for tasks assessed/performed Overall Cognitive Status: Within Functional Limits for tasks assessed                                          Exercises Total Joint Exercises Ankle Circles/Pumps: AROM, Both, 10 reps, Supine Quad Sets: AROM, Right, 5 reps, Supine Short Arc Quad: AROM, Right, 5 reps, Supine Heel Slides: AAROM, Right, Supine, 10 reps Hip ABduction/ADduction: AAROM, Right, Supine, 10 reps    General Comments        Pertinent Vitals/Pain Pain Assessment Pain Score: 7  Pain Location: right hip Pain Descriptors / Indicators: Discomfort, Operative site guarding Pain Intervention(s): Limited  activity within patient's tolerance, Monitored during session, Premedicated before session, Ice applied    Home Living                          Prior Function            PT Goals (current goals can now be found in the care plan section) Acute Rehab PT Goals Patient Stated Goal: go home PT Goal Formulation: With  patient Time For Goal Achievement: 08/31/22 Potential to Achieve Goals: Good Progress towards PT goals: Progressing toward goals    Frequency    7X/week      PT Plan Current plan remains appropriate    Co-evaluation              AM-PAC PT "6 Clicks" Mobility   Outcome Measure  Help needed turning from your back to your side while in a flat bed without using bedrails?: A Little Help needed moving from lying on your back to sitting on the side of a flat bed without using bedrails?: A Little Help needed moving to and from a bed to a chair (including a wheelchair)?: A Little Help needed standing up from a chair using your arms (e.g., wheelchair or bedside chair)?: A Little Help needed to walk in hospital room?: A Little Help needed climbing 3-5 steps with a railing? : A Lot 6 Click Score: 17    End of Session Equipment Utilized During Treatment: Gait belt Activity Tolerance: Patient tolerated treatment well Patient left: with call bell/phone within reach;in chair;with chair alarm set Nurse Communication: Mobility status PT Visit Diagnosis: Unsteadiness on feet (R26.81);Difficulty in walking, not elsewhere classified (R26.2);Pain Pain - Right/Left: Right Pain - part of body: Hip     Time: 1423-1440 PT Time Calculation (min) (ACUTE ONLY): 17 min  Charges:  $Gait Training: 8-22 mins      Ralene Bathe Kistler PT 08/25/2022  Acute Rehabilitation Services  Office [phone]

## 2022-08-25 NOTE — Progress Notes (Signed)
Physical Therapy Treatment Patient Details Name: Norman Hudson. 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 Reviewed posterior precautions in detail with pt & spouse, handout issued. Instructed pt/spouse in HEP, handout issued.    Recommendations for follow up therapy are one component of a multi-disciplinary discharge planning process, led by the attending physician.  Recommendations may be updated based on patient status, additional functional criteria and insurance authorization.  Follow Up Recommendations  Follow physician's recommendations for discharge plan and follow up therapies     Assistance Recommended at Discharge Intermittent Supervision/Assistance  Patient can return home with the following A little help with walking and/or transfers;Help with stairs or ramp for entrance;A little help with bathing/dressing/bathroom;Assist for transportation   Equipment Recommendations  None recommended by PT    Recommendations for Other Services       Precautions / Restrictions Precautions Precautions: Posterior Hip;Fall Precaution Booklet Issued: Yes (comment) Precaution Comments: VAC right hip; reviewed posterior precautions in detail, sign hung on door, handout issued to pt Restrictions Weight Bearing Restrictions: No     Mobility  Bed Mobility Overal bed mobility: Modified Independent Bed Mobility: Supine to Sit     Supine to sit: Modified independent (Device/Increase time), HOB elevated     General bed mobility comments: used gait belt as RLE lifter    Transfers Overall transfer level: Needs assistance Equipment used: Rolling walker (2 wheels) Transfers: Sit to/from Stand Sit to Stand: Min guard, Min assist, From elevated surface            General transfer comment: VCs hand placement, min/guard to stand, min A to control descent stand to sit    Ambulation/Gait Ambulation/Gait assistance: Min guard Gait Distance (Feet): 50 Feet Assistive device: Rolling walker (2 wheels) Gait Pattern/deviations: Step-to pattern, Step-through pattern, Decreased step length - right, Decreased step length - left Gait velocity: decr     General Gait Details: VCs sequencing, no dizziness, no loss of balance, distance limited by pain   Stairs             Wheelchair Mobility    Modified Rankin (Stroke Patients Only)       Balance Overall balance assessment: Needs assistance Sitting-balance support: Bilateral upper extremity supported, Feet supported Sitting balance-Leahy Scale: Good     Standing balance support: During functional activity, Bilateral upper extremity supported, Reliant on assistive device for balance Standing balance-Leahy Scale: Fair                              Cognition Arousal/Alertness: Awake/alert Behavior During Therapy: WFL for tasks assessed/performed Overall Cognitive Status: Within Functional Limits for tasks assessed                                          Exercises Total Joint Exercises Ankle Circles/Pumps: AROM, Both, 10 reps, Supine Quad Sets: AROM, Right, 5 reps, Supine Short Arc Quad: AROM, Right, 5 reps, Supine Heel Slides: AAROM, Right, 5 reps, Supine Hip ABduction/ADduction: AAROM, Right, 5 reps, Supine    General Comments        Pertinent Vitals/Pain Pain Assessment Pain Score: 7  Pain Location: right hip Pain Descriptors / Indicators: Discomfort, Operative site guarding Pain Intervention(s): Limited activity within patient's tolerance, Monitored during session, RN gave pain meds during session, Patient requesting pain meds-RN notified, Ice applied    Home Living                          Prior Function            PT Goals  (current goals can now be found in the care plan section) Acute Rehab PT Goals Patient Stated Goal: go home PT Goal Formulation: With patient/family Time For Goal Achievement: 08/31/22 Potential to Achieve Goals: Good Progress towards PT goals: Progressing toward goals    Frequency    7X/week      PT Plan Current plan remains appropriate    Co-evaluation              AM-PAC PT "6 Clicks" Mobility   Outcome Measure  Help needed turning from your back to your side while in a flat bed without using bedrails?: A Little Help needed moving from lying on your back to sitting on the side of a flat bed without using bedrails?: A Little Help needed moving to and from a bed to a chair (including a wheelchair)?: A Little Help needed standing up from a chair using your arms (e.g., wheelchair or bedside chair)?: A Little Help needed to walk in hospital room?: A Little Help needed climbing 3-5 steps with a railing? : A Lot 6 Click Score: 17    End of Session Equipment Utilized During Treatment: Gait belt Activity Tolerance: Patient tolerated treatment well Patient left: with call bell/phone within reach;in chair;with family/visitor present Nurse Communication: Mobility status PT Visit Diagnosis: Unsteadiness on feet (R26.81);Difficulty in walking, not elsewhere classified (R26.2);Pain Pain - Right/Left: Right Pain - part of body: Hip     Time: 7829-5621 PT Time Calculation (min) (ACUTE ONLY): 28 min  Charges:  $Gait Training: 8-22 mins $Therapeutic Exercise: 8-22 mins                     Ralene Bathe Kistler PT 08/25/2022  Acute Rehabilitation Services  Office [phone]

## 2022-08-26 LAB — CBC
HCT: 19.5 % — ABNORMAL LOW (ref 39.0–52.0)
Hemoglobin: 6.7 g/dL — CL (ref 13.0–17.0)
MCH: 35.4 pg — ABNORMAL HIGH (ref 26.0–34.0)
MCHC: 34.4 g/dL (ref 30.0–36.0)
MCV: 103.2 fL — ABNORMAL HIGH (ref 80.0–100.0)
Platelets: 105 10*3/uL — ABNORMAL LOW (ref 150–400)
RBC: 1.89 MIL/uL — ABNORMAL LOW (ref 4.22–5.81)
RDW: 12.6 % (ref 11.5–15.5)
WBC: 8.1 10*3/uL (ref 4.0–10.5)
nRBC: 0 % (ref 0.0–0.2)

## 2022-08-26 LAB — HEMOGLOBIN AND HEMATOCRIT, BLOOD
HCT: 24.1 % — ABNORMAL LOW (ref 39.0–52.0)
Hemoglobin: 8.4 g/dL — ABNORMAL LOW (ref 13.0–17.0)

## 2022-08-26 LAB — PREPARE RBC (CROSSMATCH)

## 2022-08-26 MED ORDER — SODIUM CHLORIDE 0.9% IV SOLUTION: Freq: Once | INTRAVENOUS | Status: AC

## 2022-08-26 MED ORDER — APIXABAN 2.5 MG PO TABS
2.5000 mg | ORAL_TABLET | Freq: Two times a day (BID) | ORAL | Status: DC
  Administered 2022-08-26: 2.5 mg via ORAL
  Filled 2022-08-26: qty 1

## 2022-08-26 NOTE — Progress Notes (Signed)
     Subjective: Patient is doing well overall. Notes some feelings of fatigue. BP and HR have been good. Doing well with PT. Denies distal n/t.   Objective:   VITALS:   Vitals:   08/25/22 0544 08/25/22 1453 08/25/22 2110 08/26/22 0512  BP: 108/62 102/70 (!) 125/57 (!) 108/58  Pulse: (!) 108 97 93 74  Resp: '16 20 16 16  '$ Temp: 98.5 F (36.9 C) 97.6 F (36.4 C) 98.2 F (36.8 C) 98.4 F (36.9 C)  TempSrc: Oral Oral Oral Oral  SpO2: 97% 100% 99% 98%  Weight:      Height:        Sensation intact distally Intact pulses distally Dorsiflexion/Plantar flexion intact Incision: dressing C/D/I Compartment soft Wound vac holding suction 158mHg continuous, no output in cannister  Lab Results  Component Value Date   WBC 8.1 08/26/2022   HGB 6.7 (LL) 08/26/2022   HCT 19.5 (L) 08/26/2022   MCV 103.2 (H) 08/26/2022   PLT 105 (L) 08/26/2022   BMET    Component Value Date/Time   NA 133 (L) 08/24/2022 0431   NA 140 09/04/2021 1448   K 4.0 08/24/2022 0431   CL 101 08/24/2022 0431   CO2 22 08/24/2022 0431   GLUCOSE 179 (H) 08/24/2022 0431   BUN 25 (H) 08/24/2022 0431   BUN 17 09/04/2021 1448   CREATININE 1.06 08/24/2022 0431   CALCIUM 8.2 (L) 08/24/2022 0431   EGFR 89 09/04/2021 1448   GFRNONAA >60 08/24/2022 0431    Xray: xrays THA components in good position without adverse features  Assessment/Plan: 3 Days Post-Op   Principal Problem:   Prost[place]  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  Anticipated resuming full dose Eliquis but will hold for now.  Does have above average swelling about the right thigh.  IntraOp blood loss was relatively normal of only 250 cc.  Will plan to transfuse 1 unit today recheck CBC tomorrow morning.  Resume Eliquis when hemoglobin stable.  Post op recs: WB: WBAT RLE, posterior hip precautions x 6 weeks given revision surgery Abx:  ancef 2g q8 post op pending final cxs, anticipate discharge on cefadroxil '500mg'$  BID x 7 days. Imaging: PACU pelvis Xray. Cxs NGTD Dressing: Prevena wound VAC intact until 1 week postop. DVT prophylaxis: Eliquis 2.5 twice daily postop day 1 and postop day 2, Eliquis 5 mg twice daily starting postop day 3 Follow up: 1 week after surgery for wound check..  [place] [address] Office Phone: [phone]   DVirgina NorfolkMChillicothe1/18/2024, 10:04 AM   DCharlies Constable MD  Contact information:   [phone]-5pm epic message Dr. MZachery Dakins or call office for patient follow up: [phone] After hours and holidays please check [email or web address] for group call information for [facility]

## 2022-08-26 NOTE — Progress Notes (Signed)
Physical Therapy Treatment Patient Details Name: Norman Hudson. 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 Mobility deferred this morning, pt performed bed level THA exercises using gait belt as a leg lifter. Pt able to state 3/3 posterior hip precautions. Ice applied to R hip. Will plan to see pt for ambulation this afternoon following transfusion.    Recommendations for follow up therapy are one component of a multi-disciplinary discharge planning process, led by the attending physician.  Recommendations may be updated based on patient status, additional functional criteria and insurance authorization.  Follow Up Recommendations  Follow physician's recommendations for discharge plan and follow up therapies     Assistance Recommended at Discharge Intermittent Supervision/Assistance  Patient can return home with the following A little help with walking and/or transfers;Help with stairs or ramp for entrance;A little help with bathing/dressing/bathroom;Assist for transportation   Equipment Recommendations  None recommended by PT    Recommendations for Other Services       Precautions / Restrictions Precautions Precautions: Posterior Hip;Fall Precaution Booklet Issued: Yes (comment) Precaution Comments: VAC right hip; pt able to state 3/3 posterior precautions,  sign hung on door, handout issued to pt Restrictions Weight Bearing Restrictions: No     Mobility  Bed Mobility               General bed mobility comments: deferred, pt awaiting blood transfusion, performed bed level THA exercises    Transfers                        Ambulation/Gait                   Stairs             Wheelchair Mobility    Modified Rankin (Stroke Patients Only)       Balance                                             Cognition Arousal/Alertness: Awake/alert Behavior During Therapy: WFL for tasks assessed/performed Overall Cognitive Status: Within Functional Limits for tasks assessed                                          Exercises Total Joint Exercises Ankle Circles/Pumps: AROM, Both, 10 reps, Supine Quad Sets: AROM, Right, Supine, 10 reps Short Arc Quad: AROM, Right, Supine, 10 reps Heel Slides: AAROM, Right, Supine, 10 reps Hip ABduction/ADduction: AAROM, Right, Supine, 10 reps    General Comments        Pertinent Vitals/Pain Pain Assessment Pain Score: 4  Pain Location: right hip Pain Descriptors / Indicators: Discomfort, Operative site guarding Pain Intervention(s): Limited activity within patient's tolerance, Monitored during session, Premedicated before session, Ice applied    Home Living                          Prior Function            PT Goals (current goals can now be found in the care plan section) Acute Rehab PT  Goals Patient Stated Goal: go home PT Goal Formulation: With patient Time For Goal Achievement: 08/31/22 Potential to Achieve Goals: Good Progress towards PT goals: Progressing toward goals    Frequency    7X/week      PT Plan Current plan remains appropriate    Co-evaluation              AM-PAC PT "6 Clicks" Mobility   Outcome Measure  Help needed turning from your back to your side while in a flat bed without using bedrails?: A Little Help needed moving from lying on your back to sitting on the side of a flat bed without using bedrails?: A Little Help needed moving to and from a bed to a chair (including a wheelchair)?: A Little Help needed standing up from a chair using your arms (e.g., wheelchair or bedside chair)?: A Little Help needed to walk in hospital room?: A Little Help needed climbing 3-5 steps with a railing? : A Lot 6 Click Score: 17    End of  Session Equipment Utilized During Treatment: Gait belt Activity Tolerance: Patient tolerated treatment well Patient left: with call bell/phone within reach;in bed;with bed alarm set Nurse Communication: Mobility status PT Visit Diagnosis: Unsteadiness on feet (R26.81);Difficulty in walking, not elsewhere classified (R26.2);Pain Pain - Right/Left: Right Pain - part of body: Hip     Time: 6295-2841 PT Time Calculation (min) (ACUTE ONLY): 12 min  Charges:  $Therapeutic Exercise: 8-22 mins                     Ralene Bathe Kistler PT 08/26/2022  Acute Rehabilitation Services  Office [phone]

## 2022-08-26 NOTE — Care Management Important Message (Signed)
Important Message  Patient Details IM Letter given. Name: Norman Hudson [facility]. MRN: [id number] Date of Birth: Jun 12, 1951   Medicare Important Message Given:  Yes     Kerin Salen 08/26/2022, 1:16 PM

## 2022-08-26 NOTE — Progress Notes (Signed)
Date and time results received: 08/26/22 at 0536   Test: Hgb Critical Value: Hgb-6.7  Name of Provider Notified: [name]  Orders Received? Or Actions Taken?: No new order at this time.

## 2022-08-26 NOTE — Progress Notes (Signed)
PT Cancellation Note  Patient Details Name: Norman Hudson. MRN: [id number] DOB: [birth year]   Cancelled Treatment:    Reason Eval/Treat Not Completed: Medical issues which prohibited therapy (Hgb 6.7, will hold PT until he's received blood transfusion.)   Philomena Doheny PT 08/26/2022  Acute Rehabilitation Services  Office [phone]

## 2022-08-26 NOTE — Progress Notes (Signed)
PT Cancellation Note  Patient Details Name: Norman Hudson. MRN: [id number] DOB: 07-Mar-1951   Cancelled Treatment:    Reason Eval/Treat Not Completed: Medical issues which prohibited therapy (transfusion just started ~20 minutes ago, pt declined mobility and stated he can do his exercises independently. Will plan to resume ambulation tomorrow.)   Philomena Doheny PT 08/26/2022  Acute Rehabilitation Services  Office [phone]

## 2022-08-27 LAB — BPAM RBC
Blood Product Expiration Date: 202401262359
ISSUE DATE / TIME: 202401181311
Unit Type and Rh: 7300

## 2022-08-27 LAB — CBC
HCT: 25.1 % — ABNORMAL LOW (ref 39.0–52.0)
Hemoglobin: 8.7 g/dL — ABNORMAL LOW (ref 13.0–17.0)
MCH: 34.4 pg — ABNORMAL HIGH (ref 26.0–34.0)
MCHC: 34.7 g/dL (ref 30.0–36.0)
MCV: 99.2 fL (ref 80.0–100.0)
Platelets: 126 10*3/uL — ABNORMAL LOW (ref 150–400)
RBC: 2.53 MIL/uL — ABNORMAL LOW (ref 4.22–5.81)
RDW: 14.4 % (ref 11.5–15.5)
WBC: 6.8 10*3/uL (ref 4.0–10.5)
nRBC: 0 % (ref 0.0–0.2)

## 2022-08-27 LAB — TYPE AND SCREEN
ABO/RH(D): B POS
Antibody Screen: NEGATIVE
Unit division: 0

## 2022-08-27 MED ORDER — OXYCODONE HCL 5 MG PO TABS: 5.0000 mg | ORAL_TABLET | ORAL | 0 refills | Status: DC | PRN

## 2022-08-27 MED ORDER — CEFADROXIL 500 MG PO CAPS: 500.0000 mg | ORAL_CAPSULE | Freq: Two times a day (BID) | ORAL | Status: DC

## 2022-08-27 MED ORDER — APIXABAN 2.5 MG PO TABS
2.5000 mg | ORAL_TABLET | Freq: Two times a day (BID) | ORAL | Status: DC
  Administered 2022-08-27 (×2): 2.5 mg via ORAL
  Filled 2022-08-27 (×2): qty 1

## 2022-08-27 MED ORDER — ONDANSETRON HCL 4 MG PO TABS: 4.0000 mg | ORAL_TABLET | Freq: Three times a day (TID) | ORAL | 0 refills | Status: AC | PRN

## 2022-08-27 MED ORDER — CEFADROXIL 500 MG PO CAPS: 500.0000 mg | ORAL_CAPSULE | Freq: Two times a day (BID) | ORAL | 0 refills | Status: AC

## 2022-08-27 MED ORDER — METHOCARBAMOL 500 MG PO TABS: 500.0000 mg | ORAL_TABLET | Freq: Three times a day (TID) | ORAL | 0 refills | Status: DC | PRN

## 2022-08-27 MED ORDER — CEFADROXIL 500 MG PO CAPS
500.0000 mg | ORAL_CAPSULE | Freq: Two times a day (BID) | ORAL | Status: DC
  Administered 2022-08-27 – 2022-08-30 (×6): 500 mg via ORAL
  Filled 2022-08-27 (×8): qty 1

## 2022-08-27 NOTE — Progress Notes (Signed)
Physical Therapy Treatment Patient Details Name: Norman Hudson. MRN: [id number] DOB: 19-Jan-1951 Today's Date: 08/27/2022   History of Present Illness 72 yo male S/P  revision R THA on 08/23/22,PMH LTHA LUmbar lam, TIA    PT Comments    Pt is progressing well with mobility, he tolerated an increased ambulation distance of 150' with RW, no loss of balance. He demonstrates good adherence to posterior total hip precautions and good understanding of HEP. Will plan to do stair training tomorrow morning, then I expect he'll be ready to DC home from a PT standpoint.     Recommendations for follow up therapy are one component of a multi-disciplinary discharge planning process, led by the attending physician.  Recommendations may be updated based on patient status, additional functional criteria and insurance authorization.  Follow Up Recommendations  Follow physician's recommendations for discharge plan and follow up therapies     Assistance Recommended at Discharge Intermittent Supervision/Assistance  Patient can return home with the following A little help with walking and/or transfers;Help with stairs or ramp for entrance;A little help with bathing/dressing/bathroom;Assist for transportation   Equipment Recommendations  None recommended by PT    Recommendations for Other Services       Precautions / Restrictions Precautions Precautions: Posterior Hip;Fall Precaution Booklet Issued: Yes (comment) Precaution Comments: VAC right hip; pt able to state 3/3 posterior precautions,  sign hung on door, handout issued to pt Restrictions Weight Bearing Restrictions: No     Mobility  Bed Mobility Overal bed mobility: Modified Independent       Supine to sit: Modified independent (Device/Increase time), HOB elevated     General bed mobility comments: up in recliner    Transfers Overall transfer level: Needs assistance Equipment used: Rolling walker (2 wheels) Transfers:  Sit to/from Stand Sit to Stand: Supervision, From elevated surface           General transfer comment: good hand placement    Ambulation/Gait Ambulation/Gait assistance: Supervision Gait Distance (Feet): 150 Feet Assistive device: Rolling walker (2 wheels) Gait Pattern/deviations: Step-to pattern, Step-through pattern, Decreased step length - right, Decreased step length - left, Trunk flexed Gait velocity: decr     General Gait Details: VCs posture,  no dizziness, no loss of balance   Stairs             Wheelchair Mobility    Modified Rankin (Stroke Patients Only)       Balance Overall balance assessment: Needs assistance Sitting-balance support: Bilateral upper extremity supported, Feet supported Sitting balance-Leahy Scale: Good     Standing balance support: During functional activity, Bilateral upper extremity supported, Reliant on assistive device for balance Standing balance-Leahy Scale: Fair                              Cognition Arousal/Alertness: Awake/alert Behavior During Therapy: WFL for tasks assessed/performed Overall Cognitive Status: Within Functional Limits for tasks assessed                                          Exercises Total Joint Exercises Ankle Circles/Pumps: AROM, Both, 10 reps, Supine Short Arc Quad: AROM, Right, Supine, 10 reps Heel Slides: AAROM, Right, Supine, 10 reps Hip ABduction/ADduction: AAROM, Right, Supine, 10 reps Long Arc Quad: AROM, Right, 10 reps, Seated    General Comments  Pertinent Vitals/Pain Pain Assessment Pain Score: 4  Pain Location: right hip Pain Descriptors / Indicators: Discomfort, Operative site guarding Pain Intervention(s): Limited activity within patient's tolerance, Monitored during session, Premedicated before session, Ice applied    Home Living                          Prior Function            PT Goals (current goals can now be found  in the care plan section) Acute Rehab PT Goals Patient Stated Goal: go home PT Goal Formulation: With patient Time For Goal Achievement: 08/31/22 Potential to Achieve Goals: Good Progress towards PT goals: Progressing toward goals    Frequency    7X/week      PT Plan Current plan remains appropriate    Co-evaluation              AM-PAC PT "6 Clicks" Mobility   Outcome Measure  Help needed turning from your back to your side while in a flat bed without using bedrails?: A Little Help needed moving from lying on your back to sitting on the side of a flat bed without using bedrails?: A Little Help needed moving to and from a bed to a chair (including a wheelchair)?: A Little Help needed standing up from a chair using your arms (e.g., wheelchair or bedside chair)?: None Help needed to walk in hospital room?: None Help needed climbing 3-5 steps with a railing? : A Little 6 Click Score: 20    End of Session Equipment Utilized During Treatment: Gait belt Activity Tolerance: Patient tolerated treatment well Patient left: with call bell/phone within reach;in chair;with chair alarm set Nurse Communication: Mobility status PT Visit Diagnosis: Unsteadiness on feet (R26.81);Difficulty in walking, not elsewhere classified (R26.2);Pain Pain - Right/Left: Right Pain - part of body: Hip     Time: 1401-1420 PT Time Calculation (min) (ACUTE ONLY): 19 min  Charges:  $Gait Training: 8-22 mins                     Ralene Bathe Kistler PT 08/27/2022  Acute Rehabilitation Services  Office [phone]

## 2022-08-27 NOTE — Progress Notes (Signed)
Physical Therapy Treatment Patient Details Name: Norman Hudson. 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 Reviewed posterior precautions in detail, pt is adhering to them well. Reviewed HEP. Will plan to do stair training tomorrow.    Recommendations for follow up therapy are one component of a multi-disciplinary discharge planning process, led by the attending physician.  Recommendations may be updated based on patient status, additional functional criteria and insurance authorization.  Follow Up Recommendations  Follow physician's recommendations for discharge plan and follow up therapies     Assistance Recommended at Discharge Intermittent Supervision/Assistance  Patient can return home with the following A little help with walking and/or transfers;Help with stairs or ramp for entrance;A little help with bathing/dressing/bathroom;Assist for transportation   Equipment Recommendations  None recommended by PT    Recommendations for Other Services       Precautions / Restrictions Precautions Precautions: Posterior Hip;Fall Precaution Booklet Issued: Yes (comment) Precaution Comments: VAC right hip; pt able to state 3/3 posterior precautions,  sign hung on door, handout issued to pt Restrictions Weight Bearing Restrictions: No     Mobility  Bed Mobility Overal bed mobility: Modified Independent       Supine to sit: Modified independent (Device/Increase time), HOB elevated     General bed mobility comments: used gait belt as RLE lifter, used bedrail    Transfers Overall transfer level: Needs assistance Equipment used: Rolling walker (2 wheels) Transfers: Sit to/from Stand Sit to Stand: Supervision, From elevated surface            General transfer comment: good hand placement    Ambulation/Gait Ambulation/Gait assistance: Supervision Gait Distance (Feet): 110 Feet Assistive device: Rolling walker (2 wheels) Gait Pattern/deviations: Step-to pattern, Step-through pattern, Decreased step length - right, Decreased step length - left, Trunk flexed Gait velocity: decr     General Gait Details: VCs posture,  no dizziness, no loss of balance, distance limited by fatigue   Stairs             Wheelchair Mobility    Modified Rankin (Stroke Patients Only)       Balance Overall balance assessment: Needs assistance Sitting-balance support: Bilateral upper extremity supported, Feet supported Sitting balance-Leahy Scale: Good     Standing balance support: During functional activity, Bilateral upper extremity supported, Reliant on assistive device for balance Standing balance-Leahy Scale: Fair                              Cognition Arousal/Alertness: Awake/alert Behavior During Therapy: WFL for tasks assessed/performed Overall Cognitive Status: Within Functional Limits for tasks assessed                                          Exercises Total Joint Exercises Ankle Circles/Pumps: AROM, Both, 10 reps, Supine Short Arc Quad: AROM, Right, Supine, 10 reps Heel Slides: AAROM, Right, Supine, 10 reps Hip ABduction/ADduction: AAROM, Right, Supine, 10 reps Long Arc Quad: AROM, Right, 10 reps, Seated    General Comments        Pertinent Vitals/Pain Pain Assessment Pain Score: 4  Pain Location:  right hip Pain Descriptors / Indicators: Discomfort, Operative site guarding Pain Intervention(s): Limited activity within patient's tolerance, Monitored during session, Premedicated before session, Ice applied    Home Living                          Prior Function            PT Goals (current goals can now be found in the care plan section) Acute Rehab PT  Goals Patient Stated Goal: go home PT Goal Formulation: With patient Time For Goal Achievement: 08/31/22 Potential to Achieve Goals: Good Progress towards PT goals: Progressing toward goals    Frequency    7X/week      PT Plan Current plan remains appropriate    Co-evaluation              AM-PAC PT "6 Clicks" Mobility   Outcome Measure  Help needed turning from your back to your side while in a flat bed without using bedrails?: A Little Help needed moving from lying on your back to sitting on the side of a flat bed without using bedrails?: A Little Help needed moving to and from a bed to a chair (including a wheelchair)?: A Little Help needed standing up from a chair using your arms (e.g., wheelchair or bedside chair)?: None Help needed to walk in hospital room?: None Help needed climbing 3-5 steps with a railing? : A Little 6 Click Score: 20    End of Session Equipment Utilized During Treatment: Gait belt Activity Tolerance: Patient tolerated treatment well Patient left: with call bell/phone within reach;in chair;with chair alarm set Nurse Communication: Mobility status PT Visit Diagnosis: Unsteadiness on feet (R26.81);Difficulty in walking, not elsewhere classified (R26.2);Pain Pain - Right/Left: Right Pain - part of body: Hip     Time: 1610-9604 PT Time Calculation (min) (ACUTE ONLY): 21 min  Charges:  $Gait Training: 8-22 mins                     Ralene Bathe Kistler PT 08/27/2022  Acute Rehabilitation Services  Office [phone]

## 2022-08-27 NOTE — Progress Notes (Signed)
     Subjective: Patient is doing well today. Swelling about the thigh is stable. Discussed increased swelling likely releated to synovectomy and debridement done for metal corrosion. Vitals stable. Hgb increasing this morning.  Objective:   VITALS:   Vitals:   08/26/22 1406 08/26/22 1703 08/26/22 2057 08/27/22 0503  BP: 121/61 113/61 123/65 116/63  Pulse: 85 82 73 (!) 101  Resp: '16 16 18 16  '$ Temp: 99.6 F (37.6 C) 99.8 F (37.7 C) 98.1 F (36.7 C) 98.3 F (36.8 C)  TempSrc: Oral Oral Oral Oral  SpO2: 98% 99% 100% 99%  Weight:      Height:        Sensation intact distally Intact pulses distally Dorsiflexion/Plantar flexion intact Incision: dressing C/D/I Compartment soft Wound vac holding suction 161mHg continuous, no output in cannister  Lab Results  Component Value Date   WBC 6.8 08/27/2022   HGB 8.7 (L) 08/27/2022   HCT 25.1 (L) 08/27/2022   MCV 99.2 08/27/2022   PLT 126 (L) 08/27/2022   BMET    Component Value Date/Time   NA 133 (L) 08/24/2022 0431   NA 140 09/04/2021 1448   K 4.0 08/24/2022 0431   CL 101 08/24/2022 0431   CO2 22 08/24/2022 0431   GLUCOSE 179 (H) 08/24/2022 0431   BUN 25 (H) 08/24/2022 0431   BUN 17 09/04/2021 1448   CREATININE 1.06 08/24/2022 0431   CALCIUM 8.2 (L) 08/24/2022 0431   EGFR 89 09/04/2021 1448   GFRNONAA >60 08/24/2022 0431    Xray: xrays THA components in good position without adverse features  Assessment/Plan: 4 Days Post-Op   Principal Problem:   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  Believe the blood loss has stabilized.  Will plan to resume Eliquis 2.5 mg today.  If stable hemoglobin tomorrow okay to discharge back on home dose Eliquis.  Post op recs: WB: WBAT RLE, posterior hip precautions x 6 weeks given revision surgery Abx: ancef 2g q8 post op pending final  cxs, anticipate discharge on cefadroxil '500mg'$  BID x 7 days. Imaging: PACU pelvis Xray. Cxs NGTD Dressing: Prevena wound VAC intact until 1 week postop. DVT prophylaxis: Eliquis 2.5 twice daily postop day 1 and postop day 2, Eliquis 5 mg twice daily starting postop day 3 Follow up: 1 week after surgery for wound check..  Address: [address] [address] Office Phone: [phone]   DWillaim Sheng1/19/2024, 9:48 AM   DCharlies Constable MD  Contact information:   [phone]-5pm epic message Dr. MZachery Dakins or call office for patient follow up: [phone] After hours and holidays please check [email or web address] for group call information for [facility]

## 2022-08-28 ENCOUNTER — Inpatient Hospital Stay (HOSPITAL_COMMUNITY): Payer: Medicare HMO | Admitting: Anesthesiology

## 2022-08-28 ENCOUNTER — Encounter (HOSPITAL_COMMUNITY)
Admission: RE | Disposition: A | Discharge status: Home / Self Care | Payer: Self-pay | Source: Home / Self Care | Attending: Orthopedic Surgery

## 2022-08-28 DIAGNOSIS — D638 Anemia in other chronic diseases classified elsewhere: Secondary | ICD-10-CM

## 2022-08-28 DIAGNOSIS — I484 Atypical atrial flutter: Secondary | ICD-10-CM | POA: Diagnosis not present

## 2022-08-28 DIAGNOSIS — E785 Hyperlipidemia, unspecified: Secondary | ICD-10-CM | POA: Diagnosis not present

## 2022-08-28 DIAGNOSIS — I10 Essential (primary) hypertension: Secondary | ICD-10-CM

## 2022-08-28 DIAGNOSIS — I4891 Unspecified atrial fibrillation: Secondary | ICD-10-CM

## 2022-08-28 DIAGNOSIS — E039 Hypothyroidism, unspecified: Secondary | ICD-10-CM

## 2022-08-28 DIAGNOSIS — Z87891 Personal history of nicotine dependence: Secondary | ICD-10-CM

## 2022-08-28 HISTORY — PX: CARDIOVERSION: SHX1299

## 2022-08-28 LAB — BASIC METABOLIC PANEL
Anion gap: 9 (ref 5–15)
BUN: 19 mg/dL (ref 8–23)
CO2: 26 mmol/L (ref 22–32)
Calcium: 8.5 mg/dL — ABNORMAL LOW (ref 8.9–10.3)
Chloride: 102 mmol/L (ref 98–111)
Creatinine, Ser: 0.75 mg/dL (ref 0.61–1.24)
GFR, Estimated: >60 mL/min (ref >60)
Glucose, Bld: 161 mg/dL — ABNORMAL HIGH (ref 70–99)
Potassium: 3.5 mmol/L (ref 3.5–5.1)
Sodium: 137 mmol/L (ref 135–145)

## 2022-08-28 LAB — AEROBIC/ANAEROBIC CULTURE W GRAM STAIN (SURGICAL/DEEP WOUND)
Culture: NO GROWTH
Culture: NO GROWTH
Culture: NO GROWTH
Gram Stain: NONE SEEN
Gram Stain: NONE SEEN
Gram Stain: NONE SEEN

## 2022-08-28 LAB — HEMOGLOBIN AND HEMATOCRIT, BLOOD
HCT: 28.4 % — ABNORMAL LOW (ref 39.0–52.0)
Hemoglobin: 9.8 g/dL — ABNORMAL LOW (ref 13.0–17.0)

## 2022-08-28 SURGERY — CARDIOVERSION
Anesthesia: General

## 2022-08-28 SURGERY — Surgical Case
Anesthesia: *Unknown

## 2022-08-28 MED ORDER — DILTIAZEM LOAD VIA INFUSION
10.0000 mg | Freq: Once | INTRAVENOUS | Status: AC
  Administered 2022-08-28: 10 mg via INTRAVENOUS
  Filled 2022-08-28: qty 10

## 2022-08-28 MED ORDER — PROPOFOL 10 MG/ML IV BOLUS
INTRAVENOUS | Status: DC | PRN
  Administered 2022-08-28: 50 mg via INTRAVENOUS
  Administered 2022-08-28: 20 mg via INTRAVENOUS

## 2022-08-28 MED ORDER — LIDOCAINE HCL (CARDIAC) PF 100 MG/5ML IV SOSY
PREFILLED_SYRINGE | INTRAVENOUS | Status: DC | PRN
  Administered 2022-08-28: 50 mg via INTRATRACHEAL

## 2022-08-28 MED ORDER — DILTIAZEM HCL 25 MG/5ML IV SOLN
20.0000 mg | Freq: Once | INTRAVENOUS | Status: AC
  Administered 2022-08-28: 20 mg via INTRAVENOUS
  Filled 2022-08-28: qty 5

## 2022-08-28 MED ORDER — FLECAINIDE ACETATE 100 MG PO TABS
100.0000 mg | ORAL_TABLET | Freq: Two times a day (BID) | ORAL | Status: DC
  Administered 2022-08-28 – 2022-08-29 (×2): 100 mg via ORAL
  Filled 2022-08-28 (×2): qty 1

## 2022-08-28 MED ORDER — DILTIAZEM HCL-DEXTROSE 125-5 MG/125ML-% IV SOLN (PREMIX)
5.0000 mg/h | INTRAVENOUS | Status: DC
  Administered 2022-08-28 – 2022-08-29 (×2): 5 mg/h via INTRAVENOUS
  Filled 2022-08-28 (×2): qty 125

## 2022-08-28 MED ORDER — LACTATED RINGERS IV SOLN: INTRAVENOUS | Status: DC | PRN

## 2022-08-28 MED ORDER — APIXABAN 5 MG PO TABS
5.0000 mg | ORAL_TABLET | Freq: Two times a day (BID) | ORAL | Status: DC
  Administered 2022-08-28 – 2022-08-30 (×5): 5 mg via ORAL
  Filled 2022-08-28 (×5): qty 1

## 2022-08-28 NOTE — Anesthesia Postprocedure Evaluation (Signed)
Anesthesia Post Note  Patient: Norman Hudson.  Procedure(s) Performed: CARDIOVERSION     Patient location during evaluation: PACU Anesthesia Type: General Level of consciousness: awake and alert and oriented Pain management: pain level controlled Vital Signs Assessment: post-procedure vital signs reviewed and stable Respiratory status: spontaneous breathing, nonlabored ventilation and respiratory function stable Cardiovascular status: blood pressure returned to baseline and stable Postop Assessment: no apparent nausea or vomiting Anesthetic complications: no   No notable events documented.  Last Vitals:  Vitals:   08/28/22 0930 08/28/22 1116  BP: (!) 154/87 122/68  Pulse: (!) 139 (!) 116  Resp: 18 20  Temp: 37.1 C 37 C  SpO2: 99% 100%    Last Pain:  Vitals:   08/28/22 1116  TempSrc: Oral  PainSc:                  Dayle Mcnerney A.

## 2022-08-28 NOTE — Progress Notes (Signed)
Report given to receiving Rn in 4th floor.

## 2022-08-28 NOTE — Progress Notes (Addendum)
ORTHO TRAUMA PROGRESS NOTE  Subjective: Patient with elevated HR this morning to 142. Denies any chest pain. No N/V. No other symptoms. Hgb increasing this morning. Ortho issues stable. Bedside EKG shows A-flutter. Spoke with Dr. Einar Gip [facility]), recommends single dose of IV Cardizem now.   Objective:   VITALS:   Vitals:   08/27/22 0503 08/27/22 1135 08/28/22 0717 08/28/22 0731  BP: 116/63 132/89 136/74 136/74  Pulse: (!) 101 98 (!) 143 (!) 142  Resp: '16 16 16 16  '$ Temp: 98.3 F (36.8 C) (!) 97.5 F (36.4 C) 98.4 F (36.9 C) 98.4 F (36.9 C)  TempSrc: Oral Oral Oral Oral  SpO2: 99% 100% 100% 99%  Weight:      Height:        Sensation intact distally Intact pulses distally Dorsiflexion/Plantar flexion intact Incision: dressing C/D/I Compartment soft Wound vac holding suction 131mHg continuous, no output in cannister  Lab Results  Component Value Date   WBC 6.8 08/27/2022   HGB 9.8 (L) 08/28/2022   HCT 28.4 (L) 08/28/2022   MCV 99.2 08/27/2022   PLT 126 (L) 08/27/2022   BMET    Component Value Date/Time   NA 133 (L) 08/24/2022 0431   NA 140 09/04/2021 1448   K 4.0 08/24/2022 0431   CL 101 08/24/2022 0431   CO2 22 08/24/2022 0431   GLUCOSE 179 (H) 08/24/2022 0431   BUN 25 (H) 08/24/2022 0431   BUN 17 09/04/2021 1448   CREATININE 1.06 08/24/2022 0431   CALCIUM 8.2 (L) 08/24/2022 0431   EGFR 89 09/04/2021 1448   GFRNONAA >60 08/24/2022 0431    Xray: xrays THA components in good position without adverse features  Assessment/Plan: 5 Days Post-Op   Principal Problem:   Prosthetic [place]  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  Believe the blood loss has stabilized.  Hgb trending upward, 9.8 this AM. Will resume home dose Eliquis.  Post op recs: WB: WBAT RLE, posterior hip precautions x 6 weeks given revision surgery Abx:  ancef 2g q8 post op pending final cxs, anticipate discharge on cefadroxil '500mg'$  BID x 7 days. Imaging: PACU pelvis Xray. Cxs NGTD Dressing: Prevena wound VAC intact until 1 week postop. DVT prophylaxis: resume home dose Eliquis Dispo: Cardizem dose now, cardiology will come to bedside if HR doesn't improve. Keep NPO for now in case cardioversion needed Follow up: 1 week after surgery for wound check..  Address: [address] [address] Office Phone: [phone]   SCorinne Ports1/20/2024, 8:13 AM   DCharlies Constable MD  Contact information:   [phone]-5pm epic message Dr. MZachery Dakins or call office for patient follow up: [phone] After hours and holidays please check [email or web address] for group call information for [facility]

## 2022-08-28 NOTE — Anesthesia Postprocedure Evaluation (Signed)
Anesthesia Post Note  Patient: Norman Hudson.  Procedure(s) Performed: CARDIOVERSION     Patient location during evaluation: PACU Anesthesia Type: General Level of consciousness: awake and alert and oriented Pain management: pain level controlled Vital Signs Assessment: post-procedure vital signs reviewed and stable Respiratory status: spontaneous breathing, nonlabored ventilation and respiratory function stable Cardiovascular status: blood pressure returned to baseline and stable Postop Assessment: no apparent nausea or vomiting Anesthetic complications: no   No notable events documented.  Last Vitals:  Vitals:   08/28/22 1230 08/28/22 1245  BP: 114/75 134/63  Pulse: (!) 115 (!) 110  Resp: 20 (!) 23  Temp: 36.7 C   SpO2: 99% 99%    Last Pain:  Vitals:   08/28/22 1245  TempSrc:   PainSc: 0-No pain                 Norman Beauregard A.

## 2022-08-28 NOTE — Anesthesia Preprocedure Evaluation (Addendum)
Anesthesia Evaluation  Patient identified by MRN, date of birth, ID band Patient awake    Reviewed: Allergy & Precautions, NPO status , Patient's Chart, lab work & pertinent test results, reviewed documented beta blocker date and time   Airway Mallampati: II  TM Distance: >3 FB Neck ROM: Full    Dental  (+) Dental Advisory Given, Caps   Pulmonary sleep apnea and Continuous Positive Airway Pressure Ventilation , former smoker   Pulmonary exam normal breath sounds clear to auscultation       Cardiovascular hypertension, Pt. on medications + dysrhythmias Atrial Fibrillation  Rhythm:Regular Rate:Tachycardia  Patient with atrial flutter post op with 2:1 block HR 142 this am Cardizem for rate control  Echo 02/12/10 Normal LV systolic function with visual EF 60-65%. Left ventricle cavity  is normal in size. Normal left ventricular wall thickness. Normal global  wall motion. Normal diastolic filling pattern, normal LAP.  Aortic valve sclerosis without stenosis.  Mild tricuspid regurgitation. No evidence of pulmonary hypertension.  Compared to 04/29/2021 otherwise no significant change.     Neuro/Psych   Anxiety     TIA   GI/Hepatic Neg liver ROS,GERD  Medicated,,  Endo/Other  Hypothyroidism  Hyperlipidemia  Renal/GU Renal diseaseHx/o renal calculi  negative genitourinary   Musculoskeletal  (+) Arthritis , Osteoarthritis,  S/P revision R THR 08/23/22   Abdominal  (+) + obese  Peds  Hematology  (+) Blood dyscrasia, anemia Eliquis therapy- restarted this am   Anesthesia Other Findings   Reproductive/Obstetrics                              Anesthesia Physical Anesthesia Plan  ASA: 2 and emergent  Anesthesia Plan: General   Post-op Pain Management: Minimal or no pain anticipated   Induction: Intravenous  PONV Risk Score and Plan: 2 and Treatment may vary due to age or medical  condition  Airway Management Planned: Natural Airway and Mask  Additional Equipment:   Intra-op Plan:   Post-operative Plan:   Informed Consent: I have reviewed the patients History and Physical, chart, labs and discussed the procedure including the risks, benefits and alternatives for the proposed anesthesia with the patient or authorized representative who has indicated his/her understanding and acceptance.     Dental advisory given  Plan Discussed with: CRNA and Anesthesiologist  Anesthesia Plan Comments:         Anesthesia Quick Evaluation

## 2022-08-28 NOTE — Progress Notes (Signed)
Patient in spite of attempted cardioversion x 3, did not convert.  He has atypical atrial flutter with RVR.  I will start him on IV diltiazem drip and also start him on flecainide as he has no structural heart disease.  His vascular and cardiac examination except for A-fib with RVR/flutter with RVR is intact and normal.  If rate is controlled he can be discharged home tomorrow and we will plan on performing outpatient direct-current cardioversion in 2 weeks to 3 weeks. Discharge medications will include metoprolol succinate 50 mg daily, flecainide 100 mg p.o. twice daily.  Obviously if he converts to sinus rhythm, please discharge home on flecainide 100 mg p.o. twice daily along with home dose of the metoprolol succinate 25 mg daily and can discontinue diltiazem.  Do not hesitate to call me if have any questions.   Adrian Prows, MD, [facility] 08/28/2022, 12:38 PM Office: [phone] Fax: [phone] Pager: [phone]  Cell: [phone]

## 2022-08-28 NOTE — Plan of Care (Signed)
  Problem: Pain Management: Goal: Pain level will decrease with appropriate interventions Outcome: Progressing   

## 2022-08-28 NOTE — CV Procedure (Signed)
Direct current cardioversion 08/28/2022 11:55 AM  Indication symptomatic A. Fibrillation/atypical flutter with RVR.  Procedure: Using 70 mg of IV Propofol and 50 IV Lidocaine (for reducing venous pain) for achieving deep sedation, synchronized direct current cardioversion performed. Patient was delivered with 150 x 2 then 200 J Joules of electricity X 1 without success, patient was in persistent atypical atrial flutter. Patient tolerated the procedure well. No immediate complication noted.     Adrian Prows, MD, [facility] 08/28/2022, 11:55 AM Office: [phone] Fax: [phone] Pager: [phone]

## 2022-08-28 NOTE — Progress Notes (Signed)
Patient HR elevated to 143 regular. Patient c/o feeling tired but denies SOB, nausea, headache, chest pain or palpitation. Attending MD /PA aware. Will monitor closely.

## 2022-08-28 NOTE — Transfer of Care (Signed)
Immediate Anesthesia Transfer of Care Note  Patient: Norman Hudson.  Procedure(s) Performed: CARDIOVERSION  Patient Location: PACU  Anesthesia Type:MAC  Level of Consciousness: awake, alert , and oriented  Airway & Oxygen Therapy: Patient Spontanous Breathing  Post-op Assessment: Report given to RN and Post -op Vital signs reviewed and stable  Post vital signs: Reviewed and stable  Last Vitals:  Vitals Value Taken Time  BP    Temp    Pulse    Resp    SpO2      Last Pain:  Vitals:   08/28/22 1116  TempSrc: Oral  PainSc:       Patients Stated Pain Goal: 1 (08/17/30 3557)  Complications: No notable events documented.

## 2022-08-28 NOTE — Progress Notes (Signed)
Patient  continue to be asymptomatic, denies nausea, CP, SOB , lightheadedness nor palpitation. Patient in and out ST at 140's and A fib 80's-120's. Ortho MD/PA as well as cardiologist made aware. Plan for cardioversion per cardiologist RRT checked pt as  well and aware about POC. Will cont to monitor closely.

## 2022-08-28 NOTE — Consult Note (Signed)
CARDIOLOGY CONSULT NOTE  Patient ID: Norman Lick. MRN: [id number] DOB/AGE: [birth year] 72 y.o.  Admit date: 08/23/2022 Referring Physician  Weber Cooks Primary Physician:  Shon Hale, MD Reason for Consultation  New A. Fib with RVR  Patient ID: Norman Lick., male    DOB: [birth year], 72 y.o.   MRN: [id number]  Palpitations and recurrence of atrial fibrillation  HPI:    Norman Kuczek.  is a 72 y.o. 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  Patient has done well postoperatively, stable hemoglobin, was about to be discharged home but then developed atrial fibrillation with rapid ventricular response.  I was consulted to manage further.  Patient noticed sudden onset of rapid palpitations this morning.  He has been maintaining sinus rhythm for many years now.  Otherwise no other complaints.  Past Medical History:  Diagnosis Date   Acne    Anxiety    takes Xanax daily as needed   Arthritis    Atrial flutter, paroxysmal (HCC)    History of kidney stones    Hyperlipidemia    takes Pravastatin daily   Hypertension    takes Amlodipine and Valsartan-HCTZ daily   Hypothyroidism    takes Synthroid daily   Inguinal hernia    left side   Internal hemorrhoids    Joint pain    Kidney stones    Pre-diabetes    Rosacea    TIA (transient ischemic attack) 06/2019   Past Surgical History:  Procedure Laterality Date   COLONOSCOPY     EXTRACORPOREAL SHOCK WAVE LITHOTRIPSY Right 12/27/2019   Procedure: EXTRACORPOREAL SHOCK WAVE LITHOTRIPSY (ESWL);  Surgeon: Malen Gauze, MD;  Location: [facility];  Service: Urology;  Laterality: Right;   LITHOTRIPSY  09/2015   LUMBAR LAMINECTOMY WITH  COFLEX 1 LEVEL N/A 07/07/2015   Procedure: LUMBAR FOUR-FIVE LUMBAR LAMINECTOMY WITH COFLEX;  Surgeon: Tia Alert, MD;  Location: [facility];  Service: Neurosurgery;  Laterality: N/A;   TONSILLECTOMY     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 HEAD AND LINER EXCHANGE. POSSIBLE CUP REVISION;  Surgeon: Joen Laura, MD;  Location: [facility];  Service: Orthopedics;  Laterality: Right;   Social History   Tobacco Use   Smoking status: Former    Packs/day: 1.00    Years: 15.00    Total pack years: 15.00    Types: Cigarettes    Quit date: 02/12/1989    Years since quitting: 33.5   Smokeless tobacco: Never   Tobacco comments:    quit smoking in 1991  Substance Use Topics   Alcohol use: No    Family History  Problem Relation Age of Onset   Hypertension Father    Sleep disorder Father    CVA Father    Cancer Maternal Grandfather    Hypertension Paternal Grandmother    Diabetes Paternal Grandmother    Alcoholism Mother    Hypertension Sister    Aneurysm  Sister    Atrial fibrillation Sister    Other Neg Hx        hypogonadism   Sleep apnea Neg Hx     Marital Status: Married  ROS  Review of Systems  Cardiovascular:  Positive for palpitations. Negative for chest pain, dyspnea on exertion and leg swelling.   Objective      08/28/2022    7:31 AM 08/28/2022    7:17 AM 08/27/2022   11:35 AM  Vitals with BMI  Systolic 136 136 846  Diastolic 74 74 89  Pulse 142 143 98    Blood pressure 136/74, pulse (!) 142, temperature 98.4 F (36.9 C), temperature source Oral, resp. rate 16, height 5\' 8"  (1.727 m), weight 95.9  kg, SpO2 99 %.    Physical Exam Neck:     Vascular: No carotid bruit or JVD.  Cardiovascular:     Rate and Rhythm: Tachycardia present. Rhythm irregularly irregular.     Pulses: Intact distal pulses.     Heart sounds: Normal heart sounds. No murmur heard.    No gallop.  Pulmonary:     Effort: Pulmonary effort is normal.     Breath sounds: Normal breath sounds.  Abdominal:     General: Bowel sounds are normal.     Palpations: Abdomen is soft.  Musculoskeletal:     Right lower leg: No edema.     Left lower leg: No edema.     Comments: Right hip postsurgical status.    Laboratory examination:   Recent Labs    09/04/21 1448 08/13/22 1332 08/24/22 0431  NA 140 140 133*  K 4.2 4.6 4.0  CL 103 106 101  CO2 24 25 22   GLUCOSE 121* 152* 179*  BUN 17 24* 25*  CREATININE 0.92 1.17 1.06  CALCIUM 9.5 9.5 8.2*  GFRNONAA  --  >60 >60      Latest Ref Rng & Units 08/24/2022    4:31 AM 08/13/2022    1:32 PM 09/04/2021    2:48 PM  CMP  Glucose 70 - 99 mg/dL 962  952  841   BUN 8 - 23 mg/dL 25  24  17    Creatinine 0.61 - 1.24 mg/dL 3.24  4.01  0.27   Sodium 135 - 145 mmol/L 133  140  140   Potassium 3.5 - 5.1 mmol/L 4.0  4.6  4.2   Chloride 98 - 111 mmol/L 101  106  103   CO2 22 - 32 mmol/L 22  25  24    Calcium 8.9 - 10.3 mg/dL 8.2  9.5  9.5   Total Protein 6.5 - 8.1 g/dL  7.3    Total Bilirubin 0.3 - 1.2 mg/dL  0.6    Alkaline Phos 38 - 126 U/L  42    AST 15 - 41 U/L  27    ALT 0 - 44 U/L  23        Latest Ref Rng & Units 08/28/2022    5:18 AM 08/27/2022    4:37 AM 08/26/2022    8:46 PM  CBC  WBC 4.0 - 10.5 K/uL  6.8    Hemoglobin 13.0 - 17.0 g/dL 9.8  8.7  8.4   Hematocrit 39.0 - 52.0 % 28.4  25.1  24.1   Platelets 150 - 400 K/uL  126     Lab Results  Component Value Date   CHOL 139 07/12/2019   HDL 46 07/12/2019   LDLCALC 72 07/12/2019   TRIG 115  07/12/2019   CHOLHDL 3.0 07/12/2019    Lipid Panel w/reflex   2021-09-10    LDL Chol Calc (NIH) 44   0-99  CHOL/HDL  2.6   2.0-4.0  Cholesterol 119   <200  HDLD 46   30-70  LDL Chol Calc (NIH) 44   0-99  NHDL 73   0-129  Triglyceride 176   0-199   Lab Results  Component Value Date   TSH 3.918 04/30/2021    Medications and allergies   Allergies  Allergen Reactions   Bactrim [Sulfamethoxazole-Trimethoprim] Hives and Itching    "threw me for a loop, hives, itching, extreme fatigue"     Current Meds  Medication Sig   acetaminophen (TYLENOL) 325 MG tablet Take 650 mg by mouth every 6 (six) hours as needed for moderate pain.   cefadroxil (DURICEF) 500 MG capsule Take 1 capsule (500 mg total) by mouth 2 (two) times daily for 5 days.   cholecalciferol (VITAMIN D3) 25 MCG (1000 UT) tablet Take 1,000 Units by mouth daily.   clonazePAM (KLONOPIN) 0.5 MG tablet Take 0.5-1 tablets (0.25-0.5 mg total) by mouth at bedtime as needed for anxiety.   metFORMIN (GLUCOPHAGE) 500 MG tablet Take 500 mg by mouth daily with breakfast.   methocarbamol (ROBAXIN) 500 MG tablet Take 1 tablet (500 mg total) by mouth every 8 (eight) hours as needed for up to 10 days for muscle spasms.   metoprolol succinate (TOPROL-XL) 25 MG 24 hr tablet Take 1 tablet (25 mg total) by mouth every morning.   Multiple Vitamins-Minerals (MULTIVITAMIN WITH MINERALS) tablet Take 1 tablet by mouth daily.   Multiple Vitamins-Minerals (PRESERVISION AREDS 2 PO) Take 1 capsule by mouth daily.   ondansetron (ZOFRAN) 4 MG tablet Take 1 tablet (4 mg total) by mouth every 8 (eight) hours as needed for up to 14 days for nausea or vomiting.   oxyCODONE (ROXICODONE) 5 MG immediate release tablet Take 1-2 tablets (5-10 mg total) by mouth every 4 (four) hours as needed for up to 7 days for severe pain or moderate pain.   pyridOXINE (VITAMIN B-6) 100 MG tablet Take 100 mg by mouth daily.   rosuvastatin (CRESTOR) 20 MG tablet TAKE ONE TABLET AT BEDTIME.   spironolactone (ALDACTONE) 25 MG tablet Take 1 tablet (25 mg total) by mouth daily.   SYNTHROID 88 MCG tablet  Take 88 mcg by mouth daily before breakfast.   traMADol (ULTRAM) 50 MG tablet Take 50 mg by mouth 2 (two) times daily.   valACYclovir (VALTREX) 1000 MG tablet Take 1,000 mg by mouth daily as needed (for herpes flare).    [DISCONTINUED] ELIQUIS 5 MG TABS tablet TAKE ONE TABLET BY MOUTH TWICE DAILY   [DISCONTINUED] hydrochlorothiazide (HYDRODIURIL) 25 MG tablet Take 1 tablet (25 mg total) by mouth every morning.   [DISCONTINUED] metoprolol succinate (TOPROL-XL) 25 MG 24 hr tablet Take 1 tablet (25 mg total) by mouth every morning.   [DISCONTINUED] olmesartan (BENICAR) 40 MG tablet Take 1 tablet (40 mg total) by mouth daily at 10 pm.    Scheduled Meds:  apixaban  5 mg Oral BID   cefadroxil  500 mg Oral BID   Chlorhexidine Gluconate Cloth  6 each Topical Daily   cholecalciferol  1,000 Units Oral Daily   docusate sodium  100 mg Oral BID   hydrochlorothiazide  25 mg Oral q morning   irbesartan  300 mg Oral QHS   levothyroxine  88 mcg Oral Q0600   metFORMIN  500 mg Oral  Q breakfast   metoprolol succinate  25 mg Oral Daily   multivitamin with minerals  1 tablet Oral Daily   pantoprazole  40 mg Oral Daily   pyridOXINE  100 mg Oral Daily   rosuvastatin  20 mg Oral QHS   senna  1 tablet Oral BID   spironolactone  25 mg Oral Daily   Continuous Infusions:  methocarbamol (ROBAXIN) IV Stopped (08/23/22 1710)   PRN Meds:.acetaminophen, diphenhydrAMINE, hydrALAZINE, HYDROmorphone (DILAUDID) injection, menthol-cetylpyridinium **OR** phenol, methocarbamol **OR** methocarbamol (ROBAXIN) IV, ondansetron **OR** ondansetron (ZOFRAN) IV, oxyCODONE, polyethylene glycol, zolpidem   I/O last 3 completed shifts: In: 1440 [P.O.:1440] Out: 2430 [Urine:2430] No intake/output data recorded.  Net IO Since Admission: 1,703.05 mL [08/28/22 0915]   Radiology:   No results found.  Cardiac Studies:   Echocardiogram: 08/12/2022:  Normal LV systolic function with visual EF 60-65%. Left ventricle cavity  is  normal in size. Normal left ventricular wall thickness. Normal global  wall motion. Normal diastolic filling pattern, normal LAP.  Aortic valve sclerosis without stenosis.  Mild tricuspid regurgitation. No evidence of pulmonary hypertension.  Compared to 04/29/2021 otherwise no significant change    Stress Testing:  Exercise myoview stress test  04/23/2019: Patient exercised for a total of 6:13 min., achieving approximately 7 METs. Normal BP response. Exercise was terminated due to fatigue/weakness and achieving THR. Normal myocardial perfusion. All segments of left ventricle demonstrated normal wall motion and thickening. Stress LV EF is hyperdynamic 75%. Low risk study.   EKG:  EKG 08/28/2022: Atypical a flutter with rapid ventricular response, nonspecific T abnormality.  EKG 08/12/2022: Normal sinus rhythm at rate of 60 bpm.  Assessment   1.  Atrial fibrillation/atypical atrial flutter with rapid ventricular response CHA2DS2-VASc Score is 4.  Yearly risk of stroke: 5% (A, HTN, TIA).  Score of 1=0.6; 2=2.2; 3=3.2; 4=4.8; 5=7.2; 6=9.8; 7=>9.8) -(CHF; HTN; vasc disease DM,  Male = 1; Age <65 =0; 65-74 = 1,  >75 =2; stroke/embolism= 2).   2.  Primary hypertension 3.  Remote history of TIA without stroke  Recommendations:   Patient in atrial fibrillation/atypical atrial flutter with rapid ventricular response.  Unlikely to slow down or cardiovert spontaneously.  Will schedule him for direct-current cardioversion.  As patient has been on anticoagulation starting today, and atrial fibrillation onset is <24 hours, as he had maintained sinus rhythm all along previously, it would be appropriate to proceed with direct-current cardioversion.  Risks and benefits was discussed with the patient who is in agreement to proceed.  Blood pressure is well-controlled.  He is presently on metoprolol.  I will also start him on diltiazem.  He has not had any TIA-like symptoms, he is otherwise stable, he has  done well postsurgically.  He was about to be discharged today but with new onset atrial fibrillation, his discharge was canceled.  I will continue to follow closely.   Yates Decamp, MD, [facility] 08/28/2022, 9:15 AM Office: [phone]

## 2022-08-28 NOTE — H&P (View-Only) (Signed)
Patient in spite of attempted cardioversion x 3, did not convert.  He has atypical atrial flutter with RVR.  I will start him on IV diltiazem drip and also start him on flecainide as he has no structural heart disease.  His vascular and cardiac examination except for A-fib with RVR/flutter with RVR is intact and normal.  If rate is controlled he can be discharged home tomorrow and we will plan on performing outpatient direct-current cardioversion in 2 weeks to 3 weeks. Discharge medications will include metoprolol succinate 50 mg daily, flecainide 100 mg p.o. twice daily.  Obviously if he converts to sinus rhythm, please discharge home on flecainide 100 mg p.o. twice daily along with home dose of the metoprolol succinate 25 mg daily and can discontinue diltiazem.  Do not hesitate to call me if have any questions.   Adrian Prows, MD, [facility] 08/28/2022, 12:38 PM Office: [phone] Fax: [phone] Pager: [phone]  Cell: [phone]

## 2022-08-28 NOTE — Progress Notes (Signed)
PT Cancellation Note  Patient Details Name: Norman Hudson. MRN: [id number] DOB: 28-May-1951   Cancelled Treatment:    Reason Eval/Treat Not Completed: Medical issues which prohibited therapy;   PT held earlier am d/t HR in 140s. Per MD Pt remains  in and out ST at 140's and A fib 80's-120's. Plan for cardioversion per cardiologist, defer PT today    [facility] 08/28/2022, 11:52 AM

## 2022-08-29 ENCOUNTER — Inpatient Hospital Stay (HOSPITAL_COMMUNITY): Payer: Medicare HMO | Admitting: Certified Registered Nurse Anesthetist

## 2022-08-29 ENCOUNTER — Encounter (HOSPITAL_COMMUNITY): Payer: Self-pay | Admitting: Orthopedic Surgery

## 2022-08-29 ENCOUNTER — Other Ambulatory Visit: Payer: Self-pay

## 2022-08-29 ENCOUNTER — Encounter (HOSPITAL_COMMUNITY)
Admission: RE | Disposition: A | Discharge status: Home / Self Care | Payer: Self-pay | Source: Home / Self Care | Attending: Orthopedic Surgery

## 2022-08-29 DIAGNOSIS — E785 Hyperlipidemia, unspecified: Secondary | ICD-10-CM

## 2022-08-29 DIAGNOSIS — I4891 Unspecified atrial fibrillation: Secondary | ICD-10-CM

## 2022-08-29 DIAGNOSIS — Z87891 Personal history of nicotine dependence: Secondary | ICD-10-CM

## 2022-08-29 DIAGNOSIS — I10 Essential (primary) hypertension: Secondary | ICD-10-CM

## 2022-08-29 DIAGNOSIS — I4819 Other persistent atrial fibrillation: Secondary | ICD-10-CM

## 2022-08-29 HISTORY — PX: CARDIOVERSION: SHX1299

## 2022-08-29 LAB — HEMOGLOBIN AND HEMATOCRIT, BLOOD
HCT: 27.4 % — ABNORMAL LOW (ref 39.0–52.0)
Hemoglobin: 9.5 g/dL — ABNORMAL LOW (ref 13.0–17.0)

## 2022-08-29 SURGERY — CARDIOVERSION
Anesthesia: General

## 2022-08-29 MED ORDER — LIDOCAINE 2% (20 MG/ML) 5 ML SYRINGE
INTRAMUSCULAR | Status: DC | PRN
  Administered 2022-08-29: 100 mg via INTRAVENOUS

## 2022-08-29 MED ORDER — METOPROLOL SUCCINATE ER 25 MG PO TB24
25.0000 mg | ORAL_TABLET | Freq: Every day | ORAL | Status: DC
  Administered 2022-08-30: 25 mg via ORAL
  Filled 2022-08-29: qty 1

## 2022-08-29 MED ORDER — PROPOFOL 10 MG/ML IV BOLUS
INTRAVENOUS | Status: DC | PRN
  Administered 2022-08-29: 70 mg via INTRAVENOUS

## 2022-08-29 MED ORDER — FLECAINIDE ACETATE 50 MG PO TABS
50.0000 mg | ORAL_TABLET | Freq: Two times a day (BID) | ORAL | Status: DC
  Administered 2022-08-29 – 2022-08-30 (×2): 50 mg via ORAL
  Filled 2022-08-29 (×2): qty 1

## 2022-08-29 MED ORDER — LACTATED RINGERS IV SOLN: INTRAVENOUS | Status: DC | PRN

## 2022-08-29 MED ORDER — FLECAINIDE ACETATE 50 MG PO TABS: 50.0000 mg | ORAL_TABLET | Freq: Two times a day (BID) | ORAL | 1 refills | Status: DC

## 2022-08-29 MED ORDER — ORAL CARE MOUTH RINSE: 15.0000 mL | OROMUCOSAL | Status: DC | PRN

## 2022-08-29 MED ORDER — FLECAINIDE ACETATE 100 MG PO TABS: 100.0000 mg | ORAL_TABLET | Freq: Two times a day (BID) | ORAL | 1 refills | Status: DC

## 2022-08-29 NOTE — Anesthesia Preprocedure Evaluation (Addendum)
Anesthesia Evaluation    Reviewed: Allergy & Precautions, Patient's Chart, lab work & pertinent test results, Unable to perform ROS - Chart review only  Airway Mallampati: II  TM Distance: >3 FB Neck ROM: Full    Dental  (+) Dental Advisory Given, Caps   Pulmonary sleep apnea and Continuous Positive Airway Pressure Ventilation , former smoker   Pulmonary exam normal breath sounds clear to auscultation       Cardiovascular hypertension, Pt. on medications + dysrhythmias Atrial Fibrillation  Rhythm:Regular Rate:Tachycardia  Patient with atrial flutter post op with 2:1 block HR 142 this am Cardizem for rate control  Echo 01/15/60 Normal LV systolic function with visual EF 60-65%. Left ventricle cavity  is normal in size. Normal left ventricular wall thickness. Normal global  wall motion. Normal diastolic filling pattern, normal LAP.  Aortic valve sclerosis without stenosis.  Mild tricuspid regurgitation. No evidence of pulmonary hypertension.  Compared to 04/29/2021 otherwise no significant change.     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   Abdominal  (+) + obese  Peds  Hematology  (+) Blood dyscrasia, anemia Eliquis therapy- restarted this am   Anesthesia Other Findings   Reproductive/Obstetrics                             Anesthesia Physical Anesthesia Plan  ASA: 2  Anesthesia Plan: General   Post-op Pain Management: Minimal or no pain anticipated   Induction: Intravenous  PONV Risk Score and Plan: 2 and Treatment may vary due to age or medical condition, Propofol infusion and TIVA  Airway Management Planned: Natural Airway and Mask  Additional Equipment:   Intra-op Plan:    Post-operative Plan:   Informed Consent: I have reviewed the patients History and Physical, chart, labs and discussed the procedure including the risks, benefits and alternatives for the proposed anesthesia with the patient or authorized representative who has indicated his/her understanding and acceptance.     Dental advisory given  Plan Discussed with: CRNA  Anesthesia Plan Comments:         Anesthesia Quick Evaluation

## 2022-08-29 NOTE — Progress Notes (Addendum)
Patient converted to sinus with successful cardioversion today.   I have sent for Flecainide 50 mg BID Rx and will arrange a visit with [place] in 2 weeks. Continue Eliquis and Metoprolol succinate 25 mg daily.   Initially had sent for 100 mg BID. EKG shows prolonged QT. Hence dose change.   Call if ?   Adrian Prows, MD, [facility] 08/29/2022, 11:51 AM Office: [phone] Fax: [phone] Pager: [phone]  Cell [phone]

## 2022-08-29 NOTE — Progress Notes (Signed)
ORTHO TRAUMA PROGRESS NOTE  Subjective: Patient underwent cardioversion x 3 yesterday.  Unfortunately this was unsuccessful and rhythm was unable to be converted.  Patient remained on Cardizem drip overnight.  Twelve-lead EKG done this morning showing cardiac rhythm fluctuating between A-fib, a flutter, sinus rhythm, sinus tachycardia.  Cardiology aware.  Patient currently n.p.o. and awaiting next steps from Dr. Einar Gip [facility] cardiology)  Objective:   VITALS:   Vitals:   08/29/22 0300 08/29/22 0530 08/29/22 0615 08/29/22 0812  BP: 106/67 111/71 107/74 104/72  Pulse: 96 80 94 94  Resp: (!) '21 16  12  '$ Temp: 98 F (36.7 C)   98.5 F (36.9 C)  TempSrc: Oral   Oral  SpO2: 97% 97% 96% 95%  Weight:      Height:        Sensation intact distally Intact pulses distally Dorsiflexion/Plantar flexion intact Incision: dressing C/D/I Compartment soft Wound vac holding suction 138mHg continuous, no output in cannister  Lab Results  Component Value Date   WBC 6.8 08/27/2022   HGB 9.5 (L) 08/29/2022   HCT 27.4 (L) 08/29/2022   MCV 99.2 08/27/2022   PLT 126 (L) 08/27/2022   BMET    Component Value Date/Time   NA 137 08/28/2022 0844   NA 140 09/04/2021 1448   K 3.5 08/28/2022 0844   CL 102 08/28/2022 0844   CO2 26 08/28/2022 0844   GLUCOSE 161 (H) 08/28/2022 0844   BUN 19 08/28/2022 0844   BUN 17 09/04/2021 1448   CREATININE 0.75 08/28/2022 0844   CALCIUM 8.5 (L) 08/28/2022 0844   EGFR 89 09/04/2021 1448   GFRNONAA >60 08/28/2022 0844    Xray: xrays THA components in good position without adverse features  Assessment/Plan: 1 Day Post-Op   Principal Problem:   Prosthetic joint implant failure, initial encounter ([facility] Active Problems:   Atrial fibrillation with RVR ([place]   Primary hypertension   Atypical atrial flutter ([place]  S/p R revision THA 08/23/22  Acute postop blood loss anemia: Hgb responded appropriately following 1 unit blood transfusion on 08/26/2022.   Hemoglobin 9.5 this morning, remains stable.    Post op recs: WB: WBAT RLE, posterior hip precautions x 6 weeks given revision surgery Abx: ancef 2g q8 post op pending final cxs, anticipate discharge on cefadroxil '500mg'$  BID x 7 days. Imaging: PACU pelvis Xray. Cxs NGTD Dressing: Prevena wound VAC intact until 1 week postop. DVT prophylaxis: home dose Eliquis Dispo: EKG this morning showing fluctuation between rhythms.  Cardiology aware and will follow-up today.  From an orthopedic standpoint, patient stable for discharge.  Will await cardiology recommendations this morning.    Follow up: 1 week after surgery for wound check with Dr. MZachery Dakins  [address] [address] [place] [address] Office Phone: [phone]  SGwinda PassePA-C [facility] ([phone](office) [email or web address]   Contact information:   Weekdays 7am-5pm epic message Dr. MZachery Dakins or call office for patient follow up: [phone] After hours and holidays please check [email or web address] for group call information for [facility]

## 2022-08-29 NOTE — Anesthesia Postprocedure Evaluation (Signed)
Anesthesia Post Note  Patient: Norman Hudson.  Procedure(s) Performed: CARDIOVERSION     Anesthesia Type: General Anesthetic complications: no   No notable events documented.  Last Vitals:  Vitals:   08/29/22 1215 08/29/22 1511  BP: (!) 104/55 (!) 113/59  Pulse: 83 92  Resp: 20 20  Temp:  36.8 C  SpO2: 97% 96%    Last Pain:  Vitals:   08/29/22 1511  TempSrc: Oral  PainSc:                  [name]

## 2022-08-29 NOTE — Progress Notes (Signed)
PT Cancellation Note  Patient Details Name: Norman Hudson. MRN: [id number] DOB: 1950/09/13   Cancelled Treatment:    Reason Eval/Treat Not Completed: Patient at procedure or test/unavailable; attempting cardioversion again, defer PT at this time and continue to follow    [facility] 08/29/2022, 11:34 AM

## 2022-08-29 NOTE — CV Procedure (Signed)
Direct current cardioversion 08/29/2022 11:45 AM  Indication symptomatic A. Fibrillation.  Procedure: Using 70  mg of IV Propofol and 100 IV Lidocaine (for reducing venous pain) for achieving deep sedation, synchronized direct current cardioversion performed. Patient was delivered with 150 Joules of electricity X 1 with success to NSR. Patient tolerated the procedure well. No immediate complication noted.     Adrian Prows, MD, [facility] 08/29/2022, 11:45 AM Office: [phone] Fax: [phone] Pager: [phone]

## 2022-08-29 NOTE — Transfer of Care (Signed)
Immediate Anesthesia Transfer of Care Note  Patient: Norman Hudson.  Procedure(s) Performed: CARDIOVERSION  Patient Location: PACU  Anesthesia Type:General  Level of Consciousness: awake, alert , and oriented  Airway & Oxygen Therapy: Patient Spontanous Breathing  Post-op Assessment: Report given to RN and Post -op Vital signs reviewed and stable  Post vital signs: Reviewed and stable  Last Vitals:  Vitals Value Taken Time  BP 97/61 08/29/22 1147  Temp    Pulse 91 08/29/22 1148  Resp 22 08/29/22 1148  SpO2 96 % 08/29/22 1148    Last Pain:  Vitals:   08/29/22 0812  TempSrc: Oral  PainSc:       Patients Stated Pain Goal: 4 (02/06/15 0109)  Complications: No notable events documented.

## 2022-08-29 NOTE — Interval H&P Note (Signed)
History and Physical Interval Note:  No new symptoms. Feels better.  Vitals:   08/29/22 0615 08/29/22 0812  BP: 107/74 104/72  Pulse: 94 94  Resp:  12  Temp:  98.5 F (36.9 C)  SpO2: 96% 95%   Physical Activity: Not on file   Cardiac: Irreg. No murmur. Lungs Clear.  Patient on Eliquis. Proceed with cardioversion.    08/29/2022 11:39 AM  [name].  has presented today for surgery, with the diagnosis of IRREGULAR RHYTHM.  The various methods of treatment have been discussed with the patient and family. After consideration of risks, benefits and other options for treatment, the patient has consented to  Procedure(s): CARDIOVERSION (N/A) as a surgical intervention.  The patient's history has been reviewed, patient examined, no change in status, stable for surgery.  I have reviewed the patient's chart and labs.  Questions were answered to the patient's satisfaction.     Adrian Prows

## 2022-08-29 NOTE — Plan of Care (Signed)

## 2022-08-29 NOTE — Progress Notes (Signed)
RN kept Cardiologist updated with patient's BP and HR, as well notifying Cardiologist that patient's 12-lead-EKG was done. Patient's cardiac rhythm would fluctuate with rhythm being in A. Fib, A. Flutter, Sinus Rhythm, Sinus Tach.

## 2022-08-29 NOTE — Progress Notes (Signed)
Instructed patient to be NPO per Cardiologist. Passed along to dayshift nurse.

## 2022-08-30 ENCOUNTER — Encounter (HOSPITAL_COMMUNITY): Payer: Self-pay | Admitting: Cardiology

## 2022-08-30 DIAGNOSIS — G4733 Obstructive sleep apnea (adult) (pediatric): Secondary | ICD-10-CM | POA: Diagnosis not present

## 2022-08-30 DIAGNOSIS — I484 Atypical atrial flutter: Secondary | ICD-10-CM | POA: Diagnosis not present

## 2022-08-30 DIAGNOSIS — Z96642 Presence of left artificial hip joint: Secondary | ICD-10-CM | POA: Diagnosis not present

## 2022-08-30 DIAGNOSIS — E039 Hypothyroidism, unspecified: Secondary | ICD-10-CM | POA: Diagnosis not present

## 2022-08-30 DIAGNOSIS — I4819 Other persistent atrial fibrillation: Secondary | ICD-10-CM | POA: Diagnosis not present

## 2022-08-30 DIAGNOSIS — E119 Type 2 diabetes mellitus without complications: Secondary | ICD-10-CM | POA: Diagnosis not present

## 2022-08-30 DIAGNOSIS — E785 Hyperlipidemia, unspecified: Secondary | ICD-10-CM | POA: Diagnosis not present

## 2022-08-30 DIAGNOSIS — T84090A Other mechanical complication of internal right hip prosthesis, initial encounter: Secondary | ICD-10-CM | POA: Diagnosis not present

## 2022-08-30 DIAGNOSIS — D62 Acute posthemorrhagic anemia: Secondary | ICD-10-CM | POA: Diagnosis not present

## 2022-08-30 DIAGNOSIS — Z87442 Personal history of urinary calculi: Secondary | ICD-10-CM | POA: Diagnosis not present

## 2022-08-30 DIAGNOSIS — I10 Essential (primary) hypertension: Secondary | ICD-10-CM | POA: Diagnosis not present

## 2022-08-30 DIAGNOSIS — R69 Illness, unspecified: Secondary | ICD-10-CM | POA: Diagnosis not present

## 2022-08-30 LAB — HEMOGLOBIN AND HEMATOCRIT, BLOOD
HCT: 29.4 % — ABNORMAL LOW (ref 39.0–52.0)
Hemoglobin: 9.8 g/dL — ABNORMAL LOW (ref 13.0–17.0)

## 2022-08-30 NOTE — Progress Notes (Signed)
     Subjective: Patient is doing well today. Swelling much improved. Pain well controlled. Back on full dose eliquis. Went for cardioversion over the weekend.  Patient hopeful to go home today.    Objective:   VITALS:   Vitals:   08/29/22 1215 08/29/22 1511 08/29/22 2109 08/30/22 0507  BP: (!) 104/55 (!) 113/59 113/64 101/70  Pulse: 83 92 81 79  Resp: '20 20  20  '$ Temp:  98.2 F (36.8 C) 98.4 F (36.9 C) 98.8 F (37.1 C)  TempSrc:  Oral Oral Oral  SpO2: 97% 96%    Weight:      Height:        Sensation intact distally Intact pulses distally Dorsiflexion/Plantar flexion intact Incision: dressing C/D/I Compartment soft Incision c/d/I, thigh swelling minimal, couple of benign blisters secondary to swelling.  Lab Results  Component Value Date   WBC 6.8 08/27/2022   HGB 9.5 (L) 08/29/2022   HCT 27.4 (L) 08/29/2022   MCV 99.2 08/27/2022   PLT 126 (L) 08/27/2022   BMET    Component Value Date/Time   NA 137 08/28/2022 0844   NA 140 09/04/2021 1448   K 3.5 08/28/2022 0844   CL 102 08/28/2022 0844   CO2 26 08/28/2022 0844   GLUCOSE 161 (H) 08/28/2022 0844   BUN 19 08/28/2022 0844   BUN 17 09/04/2021 1448   CREATININE 0.75 08/28/2022 0844   CALCIUM 8.5 (L) 08/28/2022 0844   EGFR 89 09/04/2021 1448   GFRNONAA >60 08/28/2022 0844    Xray: xrays THA components in good position without adverse features  Assessment/Plan: 1 Day Post-Op   Principal Problem:   Prosthetic joint implant failure, initial encounter [facility]) Active Problems:   Atrial fibrillation with RVR (HCC)   Primary hypertension   Atypical atrial flutter (HCC)   Persistent atrial fibrillation (HCC)  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  Post op recs: WB: WBAT RLE, posterior hip precautions x 6 weeks given revision surgery Abx: ancef 2g q8 post op pending final cxs, anticipate discharge on cefadroxil '500mg'$  BID x 7 days. Imaging: PACU  pelvis Xray. Cxs NGTD Dressing: wound vac removed 1/22, aquacell applied to incision. Mepilex dressings applied to blisters. DVT prophylaxis: back on full dose eliquis '5mg'$  BID Follow up: 1 week after surgery for wound check..  Address: [address] [place], [place], [place] [address]  Office Phone: [phone]    Willaim Sheng 08/30/2022, 7:28 AM   [name], MD  Contact information:   [phone] 7am-5pm epic message Dr. Zachery Dakins, or call office for patient follow up: [phone] After hours and holidays please check [email or web address] for group call information for [facility]

## 2022-08-30 NOTE — Discharge Summary (Signed)
Physician Discharge Summary  Patient ID: Norman [facility]. MRN: [id number] DOB/AGE: 1951-06-29 72 y.o.  Admit date: 08/23/2022 Discharge date: 08/30/2022  Admission Diagnoses:  Prosthetic joint implant failure, initial encounter Norman [facility])  Discharge Diagnoses:  Principal Problem:   Prosthetic joint implant failure, initial encounter Norman Hudson) Active Problems:   Atrial fibrillation with RVR (HCC)   Primary hypertension   Atypical atrial flutter (HCC)   Persistent atrial fibrillation (HCC)   Past Medical History:  Diagnosis Date   Acne    Anxiety    takes Xanax daily as needed   Arthritis    Atrial flutter, paroxysmal (HCC)    History of kidney stones    Hyperlipidemia    takes Pravastatin daily   Hypertension    takes Amlodipine and Valsartan-HCTZ daily   Hypothyroidism    takes Synthroid daily   Inguinal hernia    left side   Internal hemorrhoids    Joint pain    Kidney stones    Pre-diabetes    Rosacea    TIA (transient ischemic attack) 06/2019    Surgeries: Procedure(s): 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 is an 72 y.o. male who was admitted 08/23/2022 with a diagnosis of Prosthetic joint implant failure, initial encounter [facility]) and went to the operating room on 08/23/2022 and underwent the above named procedures. He did well after his hip replacement but due to post op anemia secondary to post op blood loss and swelling required a blood transfusion and then on 08/28/22 went into RVR and underwent cardioversion with cardiology x2. He was deemed stable for discharge 08/30/22.    He was given perioperative antibiotics:  Anti-infectives (From admission, onward)    Start     Dose/Rate Route Frequency Ordered Stop   08/28/22 2100  cefadroxil (DURICEF) capsule 500 mg  Status:   Discontinued        500 mg Oral 2 times daily 08/27/22 0623 08/27/22 0623   08/27/22 0623  cefadroxil (DURICEF) capsule 500 mg        500 mg Oral 2 times daily 08/27/22 0623 09/03/22 0959   08/27/22 0000  cefadroxil (DURICEF) 500 MG capsule        500 mg Oral 2 times daily 08/27/22 1337 09/01/22 2359   08/23/22 2000  ceFAZolin (ANCEF) IVPB 2g/100 mL premix        2 g 200 mL/hr over 30 Minutes Intravenous Every 8 hours 08/23/22 1525 08/26/22 1740   08/23/22 1030  ceFAZolin (ANCEF) IVPB 2g/100 mL premix        2 g 200 mL/hr over 30 Minutes Intravenous On call to O.R. 08/23/22 1020 08/23/22 1310     .  He was given sequential compression devices, early ambulation, and eliquis for DVT prophylaxis.  He benefited maximally from the [facility] stay and there were no complications.    Recent vital signs:  Vitals:   08/29/22 2109 08/30/22 0507  BP: 113/64 101/70  Pulse: 81 79  Resp:  20  Temp: 98.4 F (36.9 C) 98.8 F (37.1 C)  SpO2:      Recent laboratory studies:  Lab Results  Component Value Date   HGB 9.8 (L) 08/30/2022   HGB 9.5 (L) 08/29/2022   HGB 9.8 (L) 08/28/2022   Lab Results  Component Value Date   WBC 6.8 08/27/2022   PLT  126 (L) 08/27/2022   Lab Results  Component Value Date   INR 1.1 04/16/2021   Lab Results  Component Value Date   NA 137 08/28/2022   K 3.5 08/28/2022   CL 102 08/28/2022   CO2 26 08/28/2022   BUN 19 08/28/2022   CREATININE 0.75 08/28/2022   GLUCOSE 161 (H) 08/28/2022    Discharge Medications:   Allergies as of 08/30/2022       Reactions   Bactrim [sulfamethoxazole-trimethoprim] Hives, Itching   "threw me for a loop, hives, itching, extreme fatigue"        Medication List     STOP taking these medications    traMADol 50 MG tablet Commonly known as: ULTRAM       TAKE these medications    acetaminophen 325 MG tablet Commonly known as: TYLENOL Take 650 mg by mouth every 6 (six) hours as needed for moderate pain.    cefadroxil 500 MG capsule Commonly known as: DURICEF Take 1 capsule (500 mg total) by mouth 2 (two) times daily for 5 days.   cholecalciferol 25 MCG (1000 UNIT) tablet Commonly known as: VITAMIN D3 Take 1,000 Units by mouth daily.   clonazePAM 0.5 MG tablet Commonly known as: KLONOPIN Take 0.5-1 tablets (0.25-0.5 mg total) by mouth at bedtime as needed for anxiety.   Eliquis 5 MG Tabs tablet Generic drug: apixaban TAKE ONE TABLET BY MOUTH TWICE DAILY   flecainide 50 MG tablet Commonly known as: TAMBOCOR Take 1 tablet (50 mg total) by mouth 2 (two) times daily.   hydrALAZINE 25 MG tablet Commonly known as: APRESOLINE Take 1 tablet (25 mg total) by mouth 3 (three) times daily as needed. Take as needed for blood pressure greater than 150/90   hydrochlorothiazide 25 MG tablet Commonly known as: HYDRODIURIL Take 1 tablet (25 mg total) by mouth every morning.   metFORMIN 500 MG tablet Commonly known as: GLUCOPHAGE Take 500 mg by mouth daily with breakfast.   methocarbamol 500 MG tablet Commonly known as: ROBAXIN Take 1 tablet (500 mg total) by mouth every 8 (eight) hours as needed for up to 10 days for muscle spasms.   metoprolol succinate 25 MG 24 hr tablet Commonly known as: TOPROL-XL Take 1 tablet (25 mg total) by mouth every morning.   multivitamin with minerals tablet Take 1 tablet by mouth daily.   PRESERVISION AREDS 2 PO Take 1 capsule by mouth daily.   olmesartan 40 MG tablet Commonly known as: BENICAR Take 1 tablet (40 mg total) by mouth daily at 10 pm.   ondansetron 4 MG tablet Commonly known as: Zofran Take 1 tablet (4 mg total) by mouth every 8 (eight) hours as needed for up to 14 days for nausea or vomiting.   oxyCODONE 5 MG immediate release tablet Commonly known as: Roxicodone Take 1-2 tablets (5-10 mg total) by mouth every 4 (four) hours as needed for up to 7 days for severe pain or moderate pain.   pyridOXINE 100 MG tablet Commonly known as:  VITAMIN B6 Take 100 mg by mouth daily.   rosuvastatin 20 MG tablet Commonly known as: CRESTOR TAKE ONE TABLET AT BEDTIME.   spironolactone 25 MG tablet Commonly known as: ALDACTONE Take 1 tablet (25 mg total) by mouth daily.   Synthroid 88 MCG tablet Generic drug: levothyroxine Take 88 mcg by mouth daily before breakfast.   valACYclovir 1000 MG tablet Commonly known as: VALTREX Take 1,000 mg by mouth daily as needed (for herpes flare).  Diagnostic Studies: DG HIP UNILAT W OR W/O PELVIS 2-3 VIEWS RIGHT  Result Date: 08/23/2022 CLINICAL DATA:  Status post right hip replacement EXAM: DG HIP (WITH OR WITHOUT PELVIS) 2-3V RIGHT COMPARISON:  None Available. FINDINGS: Patient is status post right hip replacement with postoperative air in the soft tissues. The acetabular and femoral component are in good position. IMPRESSION: Right hip replacement as above. Electronically Signed   By: Gerome Sam III M.D.   On: 08/23/2022 16:09   DG C-Arm 1-60 Min-No Report  Result Date: 08/23/2022 Fluoroscopy was utilized by the requesting physician.  No radiographic interpretation.   DG C-Arm 1-60 Min-No Report  Result Date: 08/23/2022 Fluoroscopy was utilized by the requesting physician.  No radiographic interpretation.   CT HIP RIGHT WO CONTRAST  Result Date: 08/16/2022 CLINICAL DATA:  Hardware dysfunction squeaking and grinding sensation associated with right total hip prosthesis. EXAM: CT OF THE RIGHT HIP WITHOUT CONTRAST TECHNIQUE: Multidetector CT imaging of the right hip was performed according to the standard protocol. Multiplanar CT image reconstructions were also generated. RADIATION DOSE REDUCTION: This exam was performed according to the departmental dose-optimization program which includes automated exposure control, adjustment of the mA and/or kV according to patient size and/or use of iterative reconstruction technique. COMPARISON:  Radiographs 04/24/2021 FINDINGS:  Bones/Joint/Cartilage Full-thickness craniocaudad loss of the polyethylene liner of the acetabular shell component of the hip prosthesis, with the metal of the femoral component abutting the metal of the shell component as shown on image 53 series 6. The appearance suggests advanced polymer wear. There is some degenerative subcortical cyst formation along the superior acetabulum, but no large/substantial lytic lesion to suggest particulate disease involving the bony structures. No substantial abnormal lucency or complicating feature involving the stem component of the femoral prosthesis. Spinous process fixator partially imaged at the L4-5 level. The borderline bilateral foraminal stenosis at L5-S1 due to facet spurring. There is fluid density along the lateral margin of the greater trochanter, query trochanteric bursitis. Ligaments Suboptimally assessed by CT. Muscles and Tendons Unremarkable Soft tissues Atherosclerosis is present, including aortoiliac atherosclerotic disease. Scattered sigmoid colon diverticula. IMPRESSION: 1. Full-thickness craniocaudad loss/wear of the polyethylene liner of the acetabular shell component of the right hip prosthesis. 2. Fluid density along the lateral margin of the greater trochanter, query trochanteric bursitis. 3. Borderline bilateral foraminal stenosis at L5-S1 due to facet spurring. 4. Spinous process fixator partially imaged at the L4-5 level. 5. Scattered sigmoid colon diverticula. 6. Atherosclerosis. 7. There is some degenerative subcortical cyst formation along the superior acetabulum, but no large/substantial lytic lesion to suggest particulate disease involving the bony structures. Aortic Atherosclerosis (ICD10-I70.0). Electronically Signed   By: Gaylyn Rong M.D.   On: 08/16/2022 15:03   PCV ECHOCARDIOGRAM COMPLETE  Result Date: 08/12/2022 Echocardiogram 08/12/2022: Normal LV systolic function with visual EF 60-65%. Left ventricle cavity is normal in size.  Normal left ventricular wall thickness. Normal global wall motion. Normal diastolic filling pattern, normal LAP. Aortic valve sclerosis without stenosis. Mild tricuspid regurgitation. No evidence of pulmonary hypertension. Compared to 04/29/2021 otherwise no significant change.    Disposition: Discharge disposition: 01-Home or Self Care       Discharge Instructions     Call MD / Call 911   Complete by: As directed    If you experience chest pain or shortness of breath, CALL 911 and be transported to the [facility] emergency room.  If you develope a fever above 101 F, pus (white drainage) or increased drainage or redness at  the wound, or calf pain, call your surgeon's office.   Constipation Prevention   Complete by: As directed    Drink plenty of fluids.  Prune juice may be helpful.  You may use a stool softener, such as Colace (over the counter) 100 mg twice a day.  Use MiraLax (over the counter) for constipation as needed.   Diet - low sodium heart healthy   Complete by: As directed    Increase activity slowly as tolerated   Complete by: As directed    Post-operative opioid taper instructions:   Complete by: As directed    POST-OPERATIVE OPIOID TAPER INSTRUCTIONS: It is important to wean off of your opioid medication as soon as possible. If you do not need pain medication after your surgery it is ok to stop day one. Opioids include: Codeine, Hydrocodone(Norco, Vicodin), Oxycodone(Percocet, oxycontin) and hydromorphone amongst others.  Long term and even short term use of opiods can cause: Increased pain response Dependence Constipation Depression Respiratory depression And more.  Withdrawal symptoms can include Flu like symptoms Nausea, vomiting And more Techniques to manage these symptoms Hydrate well Eat regular healthy meals Stay active Use relaxation techniques(deep breathing, meditating, yoga) Do Not substitute Alcohol to help with tapering If you have been on opioids  for less than two weeks and do not have pain than it is ok to stop all together.  Plan to wean off of opioids This plan should start within one week post op of your joint replacement. Maintain the same interval or time between taking each dose and first decrease the dose.  Cut the total daily intake of opioids by one tablet each day Next start to increase the time between doses. The last dose that should be eliminated is the evening dose.           Follow-up Information     [facility] Follow up.   Specialty: Home Health Services Why: [facility] will provide PT in the home after discharge. Contact information: [address] [place] [address] [phone]         Tessa Lerner, DO. Call on 09/13/2022.   Specialties: Cardiology, Vascular Surgery Why: 09/13/2022 2:45 PM. Bring all the medications Contact information: [address] [place] [address] [phone]         Joen Laura, MD Follow up on 09/07/2022.   Specialty: Orthopedic Surgery Contact information: [address] [place] [address] [phone]                    Discharge Instructions      INSTRUCTIONS AFTER JOINT REPLACEMENT   Remove items at home which could result in a fall. This includes throw rugs or furniture in walking pathways ICE to the affected joint every three hours while awake for 30 minutes at a time, for at least the first 3-5 days, and then as needed for pain and swelling.  Continue to use ice for pain and swelling. You may notice swelling that will progress down to the foot and ankle.  This is normal after surgery.  Elevate your leg when you are not up walking on it.   Continue to use the breathing machine you got in the [facility] (incentive spirometer) which will help keep your temperature down.  It is common for your temperature to cycle up and down following surgery, especially at night when you are not up moving  around and exerting yourself.  The breathing  machine keeps your lungs expanded and your temperature down.   DIET:  As you were doing prior to hospitalization, we recommend a well-balanced diet.  DRESSING / WOUND CARE / SHOWERING  Keep the surgical dressing until follow up.  The dressing is water proof, so you can shower without any extra covering.  IF THE DRESSING FALLS OFF or the wound gets wet inside, change the dressing with sterile gauze.  Please use good hand washing techniques before changing the dressing.  Do not use any lotions or creams on the incision until instructed by your surgeon.    ACTIVITY  Increase activity slowly as tolerated, but follow the weight bearing instructions below.   No driving for 6 weeks or until further direction given by your physician.  You cannot drive while taking narcotics.  No lifting or carrying greater than 10 lbs. until further directed by your surgeon. Avoid periods of inactivity such as sitting longer than an hour when not asleep. This helps prevent blood clots.  You may return to work once you are authorized by your doctor.     WEIGHT BEARING   Weight bearing as tolerated with assist device (walker, cane, etc) as directed, use it as long as suggested by your surgeon or therapist, typically at least 4-6 weeks.   EXERCISES  Results after joint replacement surgery are often greatly improved when you follow the exercise, range of motion and muscle strengthening exercises prescribed by your doctor. Safety measures are also important to protect the joint from further injury. Any time any of these exercises cause you to have increased pain or swelling, decrease what you are doing until you are comfortable again and then slowly increase them. If you have problems or questions, call your caregiver or physical therapist for advice.   Rehabilitation is important following a joint replacement. After just a few days of immobilization, the muscles of the leg  can become weakened and shrink (atrophy).  These exercises are designed to build up the tone and strength of the thigh and leg muscles and to improve motion. Often times heat used for twenty to thirty minutes before working out will loosen up your tissues and help with improving the range of motion but do not use heat for the first two weeks following surgery (sometimes heat can increase post-operative swelling).   These exercises can be done on a training (exercise) mat, on the floor, on a table or on a bed. Use whatever works the best and is most comfortable for you.    Use music or television while you are exercising so that the exercises are a pleasant break in your day. This will make your life better with the exercises acting as a break in your routine that you can look forward to.   Perform all exercises about fifteen times, three times per day or as directed.  You should exercise both the operative leg and the other leg as well.  Exercises include:   Quad Sets - Tighten up the muscle on the front of the thigh (Quad) and hold for 5-10 seconds.   Straight Leg Raises - With your knee straight (if you were given a brace, keep it on), lift the leg to 60 degrees, hold for 3 seconds, and slowly lower the leg.  Perform this exercise against resistance later as your leg gets stronger.  Leg Slides: Lying on your back, slowly slide your foot toward your buttocks, bending your knee up off the floor (only go as far as  is comfortable). Then slowly slide your foot back down until your leg is flat on the floor again.  Angel Wings: Lying on your back spread your legs to the side as far apart as you can without causing discomfort.  Hamstring Strength:  Lying on your back, push your heel against the floor with your leg straight by tightening up the muscles of your buttocks.  Repeat, but this time bend your knee to a comfortable angle, and push your heel against the floor.  You may put a pillow under the heel to make  it more comfortable if necessary.   A rehabilitation program following joint replacement surgery can speed recovery and prevent re-injury in the future due to weakened muscles. Contact your doctor or a physical therapist for more information on knee rehabilitation.    CONSTIPATION  Constipation is defined medically as fewer than three stools per week and severe constipation as less than one stool per week.  Even if you have a regular bowel pattern at home, your normal regimen is likely to be disrupted due to multiple reasons following surgery.  Combination of anesthesia, postoperative narcotics, change in appetite and fluid intake all can affect your bowels.   YOU MUST use at least one of the following options; they are listed in order of increasing strength to get the job done.  They are all available over the counter, and you may need to use some, POSSIBLY even all of these options:    Drink plenty of fluids (prune juice may be helpful) and high fiber foods Colace 100 mg by mouth twice a day  Senokot for constipation as directed and as needed Dulcolax (bisacodyl), take with full glass of water  Miralax (polyethylene glycol) once or twice a day as needed.  If you have tried all these things and are unable to have a bowel movement in the first 3-4 days after surgery call either your surgeon or your primary doctor.    If you experience loose stools or diarrhea, hold the medications until you stool forms back up.  If your symptoms do not get better within 1 week or if they get worse, check with your doctor.  If you experience "the worst abdominal pain ever" or develop nausea or vomiting, please contact the office immediately for further recommendations for treatment.   ITCHING:  If you experience itching with your medications, try taking only a single pain pill, or even half a pain pill at a time.  You can also use Benadryl over the counter for itching or also to help with sleep.   TED HOSE  STOCKINGS:  Use stockings on both legs until for at least 2 weeks or as directed by physician office. They may be removed at night for sleeping.  MEDICATIONS:  See your medication summary on the "After Visit Summary" that nursing will review with you.  You may have some home medications which will be placed on hold until you complete the course of blood thinner medication.  It is important for you to complete the blood thinner medication as prescribed.   Blood clot prevention (DVT Prophylaxis): After surgery you are at an increased risk for a blood clot. you will resume your Eliquis to help reduce your risk of getting a blood clot. This will help prevent a blood clot. Signs of a pulmonary embolus (blood clot in the lungs) include sudden short of breath, feeling lightheaded or dizzy, chest pain with a deep breath, rapid pulse rapid breathing. Signs of a  blood clot in your arms or legs include new unexplained swelling and cramping, warm, red or darkened skin around the painful area. Please call the office or 911 right away if these signs or symptoms develop.  PRECAUTIONS:  If you experience chest pain or shortness of breath - call 911 immediately for transfer to the [facility] emergency department.   If you develop a fever greater that 101 F, purulent drainage from wound, increased redness or drainage from wound, foul odor from the wound/dressing, or calf pain - CONTACT YOUR SURGEON.                                                   FOLLOW-UP APPOINTMENTS:  If you do not already have a post-op appointment, please call the office for an appointment to be seen by your surgeon.  Guidelines for how soon to be seen are listed in your "After Visit Summary", but are typically between 2-3 weeks after surgery.  OTHER INSTRUCTIONS:   POST-OPERATIVE OPIOID TAPER INSTRUCTIONS: It is important to wean off of your opioid medication as soon as possible. If you do not need pain medication after your surgery it is ok to  stop day one. Opioids include: Codeine, Hydrocodone(Norco, Vicodin), Oxycodone(Percocet, oxycontin) and hydromorphone amongst others.  Long term and even short term use of opiods can cause: Increased pain response Dependence Constipation Depression Respiratory depression And more.  Withdrawal symptoms can include Flu like symptoms Nausea, vomiting And more Techniques to manage these symptoms Hydrate well Eat regular healthy meals Stay active Use relaxation techniques(deep breathing, meditating, yoga) Do Not substitute Alcohol to help with tapering If you have been on opioids for less than two weeks and do not have pain than it is ok to stop all together.  Plan to wean off of opioids This plan should start within one week post op of your joint replacement. Maintain the same interval or time between taking each dose and first decrease the dose.  Cut the total daily intake of opioids by one tablet each day Next start to increase the time between doses. The last dose that should be eliminated is the evening dose.   MAKE SURE YOU:  Understand these instructions.  Get help right away if you are not doing well or get worse.    Thank you for letting us be a part of your medical care team.  It is a privilege we respect greatly.  We hope these instructions will help you stay on track for a fast and full recovery!           Signed: Toby Breithaupt A Wain[facility] 08/30/2022, 12:47 PM

## 2022-08-30 NOTE — Progress Notes (Signed)
EKG from 08/30/2022 reviewed, normal sinus rhythm, occasional PACs, normal QT interval.  Patient is stable for discharge on metoprolol succinate 25 mg daily and flecainide 50 mg p.o. twice daily.  Discharge medications have been sent to the pharmacy.   Adrian Prows, MD, [facility] 08/30/2022, 9:13 AM Office: [phone] Fax: [phone] Pager: [phone]

## 2022-08-30 NOTE — Progress Notes (Signed)
Physical Therapy Treatment Patient Details Name: Norman Hudson. 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 pt developed atypical atrial flutter with RVR with HR in 140s-150s and was transferred to progressive care on 08/29/22. cardiology consulted. cardioversion attemtped x 3 without success, pt started on cardizem drip.  PMH LTHA Lumbar lam, TIA    PT Comments    Pt supposed to d/c home today and RN reports okay to work with pt.  Pt able to ambulate in hallway good distance and HR up to 128 bpm (RN aware).  Pt declined practicing stairs stating this isn't his first hip surgery, and he will have one family member behind him and one in front.  Pt able to recall "up with the good, down with the bad."  Pt repositioned in recliner end of session. Pt able to state his posterior hip precautions and follow them during mobility.  Pt anticipates f/u with HHPT and d/c home today.  Pt had no further questions.    Recommendations for follow up therapy are one component of a multi-disciplinary discharge planning process, led by the attending physician.  Recommendations may be updated based on patient status, additional functional criteria and insurance authorization.  Follow Up Recommendations  Follow physician's recommendations for discharge plan and follow up therapies     Assistance Recommended at Discharge Intermittent Supervision/Assistance  Patient can return home with the following A little help with walking and/or transfers;Help with stairs or ramp for entrance;A little help with bathing/dressing/bathroom;Assist for transportation   Equipment Recommendations  None recommended by PT    Recommendations for Other Services       Precautions / Restrictions Precautions Precautions: Posterior Hip;Fall Precaution Comments: pt able to state 3/3 posterior precautions Restrictions RLE Weight Bearing:  Weight bearing as tolerated     Mobility  Bed Mobility Overal bed mobility: Modified Independent             General bed mobility comments: increased time    Transfers Overall transfer level: Needs assistance Equipment used: Rolling walker (2 wheels) Transfers: Sit to/from Stand Sit to Stand: Min guard           General transfer comment: good hand placement    Ambulation/Gait Ambulation/Gait assistance: Min guard Gait Distance (Feet): 300 Feet Assistive device: Rolling walker (2 wheels) Gait Pattern/deviations: Step-through pattern, Trunk flexed, Decreased stance time - right, Antalgic Gait velocity: decr     General Gait Details: verbal cues for step length, RW Positioning, posture; HR up to 128 bpm (89 bpm at rest) and RN notified; pt denies symptoms   Stairs             Wheelchair Mobility    Modified Rankin (Stroke Patients Only)       Balance                                            Cognition Arousal/Alertness: Awake/alert Behavior During Therapy: WFL for tasks assessed/performed Overall Cognitive Status: Within Functional Limits for tasks assessed                                          Exercises      General Comments  Pertinent Vitals/Pain Pain Assessment Pain Assessment: 0-10 Pain Score: 3  Pain Location: right hip Pain Descriptors / Indicators: Discomfort, Operative site guarding Pain Intervention(s): Repositioned, Monitored during session    Home Living                          Prior Function            PT Goals (current goals can now be found in the care plan section) Progress towards PT goals: Progressing toward goals    Frequency    7X/week      PT Plan Current plan remains appropriate    Co-evaluation              AM-PAC PT "6 Clicks" Mobility   Outcome Measure  Help needed turning from your back to your side while in a flat bed without  using bedrails?: A Little Help needed moving from lying on your back to sitting on the side of a flat bed without using bedrails?: A Little Help needed moving to and from a bed to a chair (including a wheelchair)?: A Little Help needed standing up from a chair using your arms (e.g., wheelchair or bedside chair)?: A Little Help needed to walk in hospital room?: A Little Help needed climbing 3-5 steps with a railing? : A Little 6 Click Score: 18    End of Session Equipment Utilized During Treatment: Gait belt Activity Tolerance: Patient tolerated treatment well Patient left: in chair;with call bell/phone within reach (agreeable not to get out of recliner without assist)   PT Visit Diagnosis: Difficulty in walking, not elsewhere classified (R26.2)     Time: 0911-0930 PT Time Calculation (min) (ACUTE ONLY): 19 min  Charges:  $Gait Training: 8-22 mins                    Paulino Door, DPT Physical Therapist Acute Rehabilitation Services Preferred contact method: Secure Chat Weekend Pager Only: [phone] Office: [phone]    Janan Halter [place] 08/30/2022, 12:15 PM

## 2022-08-30 NOTE — Progress Notes (Signed)
Patient discharged home.  IV removed - WNL.  Reviewed AVS and medications - patient verbalizes understanding.  Instructed to call for follow up apt with ortho.  Emphasized importance of mobility and constipation prevention.Patient has no questions at this time.  Patient assisted off unit via WC to PV in NAD.

## 2022-09-02 DIAGNOSIS — E785 Hyperlipidemia, unspecified: Secondary | ICD-10-CM | POA: Diagnosis not present

## 2022-09-02 DIAGNOSIS — L719 Rosacea, unspecified: Secondary | ICD-10-CM | POA: Diagnosis not present

## 2022-09-02 DIAGNOSIS — Z471 Aftercare following joint replacement surgery: Secondary | ICD-10-CM | POA: Diagnosis not present

## 2022-09-02 DIAGNOSIS — G4733 Obstructive sleep apnea (adult) (pediatric): Secondary | ICD-10-CM | POA: Diagnosis not present

## 2022-09-02 DIAGNOSIS — Z9181 History of falling: Secondary | ICD-10-CM | POA: Diagnosis not present

## 2022-09-02 DIAGNOSIS — Z96642 Presence of left artificial hip joint: Secondary | ICD-10-CM | POA: Diagnosis not present

## 2022-09-02 DIAGNOSIS — K5900 Constipation, unspecified: Secondary | ICD-10-CM | POA: Diagnosis not present

## 2022-09-02 DIAGNOSIS — G4752 REM sleep behavior disorder: Secondary | ICD-10-CM | POA: Diagnosis not present

## 2022-09-02 DIAGNOSIS — Z7984 Long term (current) use of oral hypoglycemic drugs: Secondary | ICD-10-CM | POA: Diagnosis not present

## 2022-09-02 DIAGNOSIS — M1990 Unspecified osteoarthritis, unspecified site: Secondary | ICD-10-CM | POA: Diagnosis not present

## 2022-09-02 DIAGNOSIS — Z87891 Personal history of nicotine dependence: Secondary | ICD-10-CM | POA: Diagnosis not present

## 2022-09-02 DIAGNOSIS — T84010D Broken internal right hip prosthesis, subsequent encounter: Secondary | ICD-10-CM | POA: Diagnosis not present

## 2022-09-02 DIAGNOSIS — M25551 Pain in right hip: Secondary | ICD-10-CM | POA: Diagnosis not present

## 2022-09-02 DIAGNOSIS — R69 Illness, unspecified: Secondary | ICD-10-CM | POA: Diagnosis not present

## 2022-09-02 DIAGNOSIS — Z950 Presence of cardiac pacemaker: Secondary | ICD-10-CM | POA: Diagnosis not present

## 2022-09-02 DIAGNOSIS — T84030D Mechanical loosening of internal right hip prosthetic joint, subsequent encounter: Secondary | ICD-10-CM | POA: Diagnosis not present

## 2022-09-02 DIAGNOSIS — Z7901 Long term (current) use of anticoagulants: Secondary | ICD-10-CM | POA: Diagnosis not present

## 2022-09-02 DIAGNOSIS — I10 Essential (primary) hypertension: Secondary | ICD-10-CM | POA: Diagnosis not present

## 2022-09-02 DIAGNOSIS — Z87442 Personal history of urinary calculi: Secondary | ICD-10-CM | POA: Diagnosis not present

## 2022-09-02 DIAGNOSIS — E039 Hypothyroidism, unspecified: Secondary | ICD-10-CM | POA: Diagnosis not present

## 2022-09-02 DIAGNOSIS — R7303 Prediabetes: Secondary | ICD-10-CM | POA: Diagnosis not present

## 2022-09-02 DIAGNOSIS — I4892 Unspecified atrial flutter: Secondary | ICD-10-CM | POA: Diagnosis not present

## 2022-09-02 DIAGNOSIS — I480 Paroxysmal atrial fibrillation: Secondary | ICD-10-CM | POA: Diagnosis not present

## 2022-09-02 DIAGNOSIS — Z8673 Personal history of transient ischemic attack (TIA), and cerebral infarction without residual deficits: Secondary | ICD-10-CM | POA: Diagnosis not present

## 2022-09-02 DIAGNOSIS — I951 Orthostatic hypotension: Secondary | ICD-10-CM | POA: Diagnosis not present

## 2022-09-03 ENCOUNTER — Inpatient Hospital Stay (HOSPITAL_COMMUNITY)
Admission: EM | Admit: 2022-09-03 | Discharge: 2022-09-10 | DRG: 920 | Disposition: A | Discharge status: Home Health | Payer: Medicare HMO | Attending: Orthopedic Surgery | Admitting: Orthopedic Surgery

## 2022-09-03 ENCOUNTER — Emergency Department (HOSPITAL_COMMUNITY): Payer: Medicare HMO

## 2022-09-03 ENCOUNTER — Other Ambulatory Visit: Payer: Self-pay

## 2022-09-03 ENCOUNTER — Encounter (HOSPITAL_COMMUNITY): Payer: Self-pay

## 2022-09-03 DIAGNOSIS — T148XXA Other injury of unspecified body region, initial encounter: Secondary | ICD-10-CM | POA: Diagnosis not present

## 2022-09-03 DIAGNOSIS — Z8249 Family history of ischemic heart disease and other diseases of the circulatory system: Secondary | ICD-10-CM | POA: Diagnosis not present

## 2022-09-03 DIAGNOSIS — Z87891 Personal history of nicotine dependence: Secondary | ICD-10-CM

## 2022-09-03 DIAGNOSIS — Z79899 Other long term (current) drug therapy: Secondary | ICD-10-CM

## 2022-09-03 DIAGNOSIS — Z4682 Encounter for fitting and adjustment of non-vascular catheter: Secondary | ICD-10-CM | POA: Diagnosis not present

## 2022-09-03 DIAGNOSIS — Z7984 Long term (current) use of oral hypoglycemic drugs: Secondary | ICD-10-CM

## 2022-09-03 DIAGNOSIS — D649 Anemia, unspecified: Secondary | ICD-10-CM | POA: Diagnosis not present

## 2022-09-03 DIAGNOSIS — Z96643 Presence of artificial hip joint, bilateral: Secondary | ICD-10-CM | POA: Diagnosis present

## 2022-09-03 DIAGNOSIS — E785 Hyperlipidemia, unspecified: Secondary | ICD-10-CM | POA: Diagnosis not present

## 2022-09-03 DIAGNOSIS — Z87442 Personal history of urinary calculi: Secondary | ICD-10-CM | POA: Diagnosis not present

## 2022-09-03 DIAGNOSIS — E039 Hypothyroidism, unspecified: Secondary | ICD-10-CM | POA: Diagnosis not present

## 2022-09-03 DIAGNOSIS — M7989 Other specified soft tissue disorders: Secondary | ICD-10-CM | POA: Diagnosis not present

## 2022-09-03 DIAGNOSIS — I10 Essential (primary) hypertension: Secondary | ICD-10-CM | POA: Diagnosis not present

## 2022-09-03 DIAGNOSIS — I4892 Unspecified atrial flutter: Secondary | ICD-10-CM

## 2022-09-03 DIAGNOSIS — Z882 Allergy status to sulfonamides status: Secondary | ICD-10-CM | POA: Diagnosis not present

## 2022-09-03 DIAGNOSIS — R609 Edema, unspecified: Secondary | ICD-10-CM | POA: Diagnosis not present

## 2022-09-03 DIAGNOSIS — Y838 Other surgical procedures as the cause of abnormal reaction of the patient, or of later complication, without mention of misadventure at the time of the procedure: Secondary | ICD-10-CM | POA: Diagnosis present

## 2022-09-03 DIAGNOSIS — Z833 Family history of diabetes mellitus: Secondary | ICD-10-CM | POA: Diagnosis not present

## 2022-09-03 DIAGNOSIS — S7011XA Contusion of right thigh, initial encounter: Secondary | ICD-10-CM | POA: Diagnosis not present

## 2022-09-03 DIAGNOSIS — K5730 Diverticulosis of large intestine without perforation or abscess without bleeding: Secondary | ICD-10-CM | POA: Diagnosis not present

## 2022-09-03 DIAGNOSIS — Z96641 Presence of right artificial hip joint: Secondary | ICD-10-CM

## 2022-09-03 DIAGNOSIS — S8011XA Contusion of right lower leg, initial encounter: Secondary | ICD-10-CM | POA: Diagnosis not present

## 2022-09-03 DIAGNOSIS — S7001XA Contusion of right hip, initial encounter: Secondary | ICD-10-CM | POA: Diagnosis present

## 2022-09-03 DIAGNOSIS — Z7901 Long term (current) use of anticoagulants: Secondary | ICD-10-CM | POA: Diagnosis not present

## 2022-09-03 DIAGNOSIS — L7632 Postprocedural hematoma of skin and subcutaneous tissue following other procedure: Principal | ICD-10-CM | POA: Diagnosis present

## 2022-09-03 DIAGNOSIS — M96840 Postprocedural hematoma of a musculoskeletal structure following a musculoskeletal system procedure: Secondary | ICD-10-CM | POA: Diagnosis not present

## 2022-09-03 DIAGNOSIS — Z471 Aftercare following joint replacement surgery: Secondary | ICD-10-CM | POA: Diagnosis not present

## 2022-09-03 DIAGNOSIS — E119 Type 2 diabetes mellitus without complications: Secondary | ICD-10-CM | POA: Diagnosis present

## 2022-09-03 DIAGNOSIS — F419 Anxiety disorder, unspecified: Secondary | ICD-10-CM | POA: Diagnosis not present

## 2022-09-03 DIAGNOSIS — Z79890 Hormone replacement therapy (postmenopausal): Secondary | ICD-10-CM | POA: Diagnosis not present

## 2022-09-03 DIAGNOSIS — T80818A Extravasation of other vesicant agent, initial encounter: Secondary | ICD-10-CM | POA: Diagnosis present

## 2022-09-03 DIAGNOSIS — Z8673 Personal history of transient ischemic attack (TIA), and cerebral infarction without residual deficits: Secondary | ICD-10-CM

## 2022-09-03 DIAGNOSIS — I484 Atypical atrial flutter: Secondary | ICD-10-CM | POA: Diagnosis not present

## 2022-09-03 DIAGNOSIS — G4733 Obstructive sleep apnea (adult) (pediatric): Secondary | ICD-10-CM | POA: Diagnosis not present

## 2022-09-03 DIAGNOSIS — I4891 Unspecified atrial fibrillation: Secondary | ICD-10-CM | POA: Diagnosis not present

## 2022-09-03 DIAGNOSIS — R69 Illness, unspecified: Secondary | ICD-10-CM | POA: Diagnosis not present

## 2022-09-03 LAB — COMPREHENSIVE METABOLIC PANEL
ALT: 27 U/L (ref 0–44)
AST: 39 U/L (ref 15–41)
Albumin: 3.3 g/dL — ABNORMAL LOW (ref 3.5–5.0)
Alkaline Phosphatase: 37 U/L — ABNORMAL LOW (ref 38–126)
Anion gap: 10 (ref 5–15)
BUN: 41 mg/dL — ABNORMAL HIGH (ref 8–23)
CO2: 23 mmol/L (ref 22–32)
Calcium: 8.9 mg/dL (ref 8.9–10.3)
Chloride: 100 mmol/L (ref 98–111)
Creatinine, Ser: 1.38 mg/dL — ABNORMAL HIGH (ref 0.61–1.24)
GFR, Estimated: 54 mL/min — ABNORMAL LOW (ref >60)
Glucose, Bld: 171 mg/dL — ABNORMAL HIGH (ref 70–99)
Potassium: 3.8 mmol/L (ref 3.5–5.1)
Sodium: 133 mmol/L — ABNORMAL LOW (ref 135–145)
Total Bilirubin: 1.7 mg/dL — ABNORMAL HIGH (ref 0.3–1.2)
Total Protein: 6.5 g/dL (ref 6.5–8.1)

## 2022-09-03 LAB — I-STAT CHEM 8, ED
BUN: 40 mg/dL — ABNORMAL HIGH (ref 8–23)
Calcium, Ion: 1.23 mmol/L (ref 1.15–1.40)
Chloride: 100 mmol/L (ref 98–111)
Creatinine, Ser: 1.3 mg/dL — ABNORMAL HIGH (ref 0.61–1.24)
Glucose, Bld: 160 mg/dL — ABNORMAL HIGH (ref 70–99)
HCT: 24 % — ABNORMAL LOW (ref 39.0–52.0)
Hemoglobin: 8.2 g/dL — ABNORMAL LOW (ref 13.0–17.0)
Potassium: 3.8 mmol/L (ref 3.5–5.1)
Sodium: 136 mmol/L (ref 135–145)
TCO2: 24 mmol/L (ref 22–32)

## 2022-09-03 LAB — CBC WITH DIFFERENTIAL/PLATELET
Abs Immature Granulocytes: 0.39 10*3/uL — ABNORMAL HIGH (ref 0.00–0.07)
Basophils Absolute: 0.1 10*3/uL (ref 0.0–0.1)
Basophils Relative: 0 %
Eosinophils Absolute: 0 10*3/uL (ref 0.0–0.5)
Eosinophils Relative: 0 %
HCT: 23.6 % — ABNORMAL LOW (ref 39.0–52.0)
Hemoglobin: 7.9 g/dL — ABNORMAL LOW (ref 13.0–17.0)
Immature Granulocytes: 2 %
Lymphocytes Relative: 6 %
Lymphs Abs: 1.1 10*3/uL (ref 0.7–4.0)
MCH: 34.2 pg — ABNORMAL HIGH (ref 26.0–34.0)
MCHC: 33.5 g/dL (ref 30.0–36.0)
MCV: 102.2 fL — ABNORMAL HIGH (ref 80.0–100.0)
Monocytes Absolute: 2.6 10*3/uL — ABNORMAL HIGH (ref 0.1–1.0)
Monocytes Relative: 15 %
Neutro Abs: 13.4 10*3/uL — ABNORMAL HIGH (ref 1.7–7.7)
Neutrophils Relative %: 77 %
Platelets: 292 10*3/uL (ref 150–400)
RBC: 2.31 MIL/uL — ABNORMAL LOW (ref 4.22–5.81)
RDW: 13.6 % (ref 11.5–15.5)
WBC: 17.6 10*3/uL — ABNORMAL HIGH (ref 4.0–10.5)
nRBC: 0 % (ref 0.0–0.2)

## 2022-09-03 MED ORDER — OXYCODONE HCL 5 MG PO TABS
10.0000 mg | ORAL_TABLET | ORAL | Status: DC | PRN
  Administered 2022-09-03: 10 mg via ORAL
  Administered 2022-09-04 – 2022-09-07 (×12): 15 mg via ORAL
  Administered 2022-09-07: 10 mg via ORAL
  Administered 2022-09-07 – 2022-09-10 (×14): 15 mg via ORAL
  Filled 2022-09-03 (×21): qty 3
  Filled 2022-09-03: qty 2
  Filled 2022-09-03: qty 3
  Filled 2022-09-03: qty 2
  Filled 2022-09-03 (×5): qty 3

## 2022-09-03 MED ORDER — OXYCODONE HCL 5 MG PO TABS
5.0000 mg | ORAL_TABLET | ORAL | Status: DC | PRN
  Administered 2022-09-03 – 2022-09-07 (×2): 5 mg via ORAL
  Filled 2022-09-03 (×2): qty 1
  Filled 2022-09-03: qty 2

## 2022-09-03 MED ORDER — ONDANSETRON HCL 4 MG/2ML IJ SOLN: 4.0000 mg | Freq: Four times a day (QID) | INTRAMUSCULAR | Status: DC | PRN

## 2022-09-03 MED ORDER — ACETAMINOPHEN 325 MG PO TABS: 325.0000 mg | ORAL_TABLET | Freq: Four times a day (QID) | ORAL | Status: DC | PRN

## 2022-09-03 MED ORDER — HYDROMORPHONE HCL 1 MG/ML IJ SOLN
0.5000 mg | Freq: Once | INTRAMUSCULAR | Status: AC
  Administered 2022-09-03: 0.5 mg via INTRAVENOUS
  Filled 2022-09-03: qty 1

## 2022-09-03 MED ORDER — METHOCARBAMOL 1000 MG/10ML IJ SOLN: 500.0000 mg | Freq: Four times a day (QID) | INTRAVENOUS | Status: DC | PRN

## 2022-09-03 MED ORDER — ACETAMINOPHEN 500 MG PO TABS
1000.0000 mg | ORAL_TABLET | Freq: Four times a day (QID) | ORAL | Status: AC
  Administered 2022-09-03 – 2022-09-04 (×4): 1000 mg via ORAL
  Filled 2022-09-03 (×3): qty 2

## 2022-09-03 MED ORDER — IOHEXOL 350 MG/ML SOLN
75.0000 mL | Freq: Once | INTRAVENOUS | Status: AC | PRN
  Administered 2022-09-03: 75 mL via INTRAVENOUS

## 2022-09-03 MED ORDER — HYDROMORPHONE HCL 1 MG/ML IJ SOLN: 0.5000 mg | INTRAMUSCULAR | Status: DC | PRN

## 2022-09-03 MED ORDER — IOHEXOL 350 MG/ML SOLN
100.0000 mL | Freq: Once | INTRAVENOUS | Status: AC | PRN
  Administered 2022-09-03: 100 mL via INTRAVENOUS

## 2022-09-03 MED ORDER — ONDANSETRON HCL 4 MG PO TABS: 4.0000 mg | ORAL_TABLET | Freq: Four times a day (QID) | ORAL | Status: DC | PRN

## 2022-09-03 MED ORDER — ZOLPIDEM TARTRATE 5 MG PO TABS: 5.0000 mg | ORAL_TABLET | Freq: Every evening | ORAL | Status: DC | PRN

## 2022-09-03 MED ORDER — LACTATED RINGERS IV BOLUS
1000.0000 mL | Freq: Once | INTRAVENOUS | Status: AC
  Administered 2022-09-03: 1000 mL via INTRAVENOUS

## 2022-09-03 MED ORDER — OXYCODONE HCL 5 MG PO TABS
5.0000 mg | ORAL_TABLET | Freq: Once | ORAL | Status: AC
  Administered 2022-09-03: 5 mg via ORAL
  Filled 2022-09-03: qty 1

## 2022-09-03 MED ORDER — METHOCARBAMOL 500 MG PO TABS
500.0000 mg | ORAL_TABLET | Freq: Four times a day (QID) | ORAL | Status: DC | PRN
  Administered 2022-09-03 – 2022-09-09 (×5): 500 mg via ORAL
  Filled 2022-09-03 (×5): qty 1

## 2022-09-03 NOTE — ED Provider Notes (Signed)
  Physical Exam  BP 121/68 (BP Location: Right Arm)   Pulse 100   Temp 98 F (36.7 C) (Oral)   Resp 16   Ht '5\' 7"'$  (1.702 m)   Wt 95.3 kg   SpO2 96%   BMI 32.89 kg/m   Physical Exam  Procedures  Procedures  ED Course / MDM   Clinical Course as of 09/04/22 1428  Fri Sep 03, 2022  1623 Assumed care from Dr. Ashok Cordia.  72 year old male with R total hip surgery on 1/15 who presents with R thigh swelling.  CT shows large hematoma present. In significant pain so ortho consulted for compartment syndrome. They also want vascular study to evaluate for DVT. However, he is on [facility] and has had a drop in his hgb. Feel that he should be admitted for pain control and possible serial hgb.  [RP]  1703 No evidence of DVT on ultrasound.  Will reach out to orthopedics.  On repeat evaluation does have a swollen right thigh but compartments do appear compressible at this time.  Does have good pulses in his foot distally. [RP]  5366 Dr Zachery Dakins recommending CTA of the lower extremity to evaluate for arterial bleed that may require IR intervention. [RP]  2042 Dr Geroge Baseman from IR consulted. Feels that he should have his AC reversed if possible. Does not feel that embolization would be appropriate at this time. May require draining hematoma if it persists. They will see him as a consult.  Ortho has admitted the patient was made aware of these recommendations. [RP]    Clinical Course User Index [RP] Fransico Meadow, MD   Medical Decision Making Amount and/or Complexity of Data Reviewed Radiology: ordered.  Risk Prescription drug management. Decision regarding hospitalization.     Fransico Meadow, MD 09/04/22 [phone]

## 2022-09-03 NOTE — ED Notes (Signed)
Pt right leg blotchy especially in thigh area.  Pale and cool to touch.  Pt right foot weeping clear fluid.  Pulses verified by doppler.  No new orders at this time.

## 2022-09-03 NOTE — ED Notes (Signed)
ED TO INPATIENT HANDOFF REPORT  ED Nurse Name and Phone #: Angelica Chessman, ( but I'm leaving, and the next nurse is with a critical patient )  S Name/Age/Gender Norman Hudson. 72 y.o. male Room/Bed: 013C/013C  Code Status   Code Status: Full Code  Home/SNF/Other Home Patient oriented to: self, place, time, and situation Is this baseline? Yes   Triage Complete: Triage complete  Chief Complaint Hematoma of right hip [S70.01XA]  Triage Note Pt came in via POV d/t his swelling not going down since his Rt hip replacement on the 15th this month. Pt states that his pain is 10/10, his Rt thigh is severely swollen in triage with some splotchy skin discoloration. Pt endorses being able to bear weight but it is very painful for him to do so. Can feel his Rt foot & doppler was used and verified pulse was present.   Allergies Allergies  Allergen Reactions   Bactrim [Sulfamethoxazole-Trimethoprim] Hives and Itching    "threw me for a loop, hives, itching, extreme fatigue"    Level of Care/Admitting Diagnosis ED Disposition     ED Disposition  Admit   Condition  --   Comment  Hospital Area: [facility] [[id number]]  Level of Care: Telemetry Cardiac [103]  May admit patient to [facility] or [facility] if equivalent level of care is available:: Yes  Covid Evaluation: Asymptomatic - no recent exposure (last 10 days) testing not required  Diagnosis: Hematoma of right hip [161096]  Admitting Physician: Joen Laura [[id number]]  Attending Physician: Joen Laura [[id number]]  Certification:: I certify this patient will need inpatient services for at least 2 midnights  Estimated Length of Stay: 3          B Medical/Surgery History Past Medical History:  Diagnosis Date   Acne    Anxiety    takes Xanax daily as needed   Arthritis    Atrial flutter, paroxysmal (HCC)    History of kidney stones    Hyperlipidemia    takes Pravastatin daily    Hypertension    takes Amlodipine and Valsartan-HCTZ daily   Hypothyroidism    takes Synthroid daily   Inguinal hernia    left side   Internal hemorrhoids    Joint pain    Kidney stones    Pre-diabetes    Rosacea    TIA (transient ischemic attack) 06/2019   Past Surgical History:  Procedure Laterality Date   CARDIOVERSION N/A 08/28/2022   Procedure: CARDIOVERSION;  Surgeon: Yates Decamp, MD;  Location: [facility];  Service: Cardiovascular;  Laterality: N/A;   CARDIOVERSION N/A 08/29/2022   Procedure: CARDIOVERSION;  Surgeon: Yates Decamp, MD;  Location: [facility];  Service: Cardiovascular;  Laterality: N/A;   COLONOSCOPY     EXTRACORPOREAL SHOCK WAVE LITHOTRIPSY Right 12/27/2019   Procedure: EXTRACORPOREAL SHOCK WAVE LITHOTRIPSY (ESWL);  Surgeon: Malen Gauze, MD;  Location: [facility];  Service: Urology;  Laterality: Right;   LITHOTRIPSY  09/2015   LUMBAR LAMINECTOMY WITH COFLEX 1 LEVEL N/A 07/07/2015   Procedure: LUMBAR FOUR-FIVE LUMBAR LAMINECTOMY WITH COFLEX;  Surgeon: Tia Alert, MD;  Location: [facility];  Service: Neurosurgery;  Laterality: N/A;   TONSILLECTOMY     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 HEAD AND LINER EXCHANGE. POSSIBLE CUP REVISION;  Surgeon: Joen Laura, MD;  Location: [facility];  Service: Orthopedics;  Laterality: Right;     A IV Location/Drains/Wounds Patient Lines/Drains/Airways Status     Active Line/Drains/Airways     Name Placement date Placement time Site Days   Peripheral IV 08/23/22 18 G Left;Posterior Hand 08/23/22  1145  Hand  11   Peripheral IV 09/03/22 20 G  Left Antecubital 09/03/22  1219  Antecubital  less than 1   Negative Pressure Wound Therapy Hip Anterior;Right 08/23/22  --  --  11            Intake/Output Last 24 hours  Intake/Output Summary (Last 24 hours) at 09/03/2022 1919 Last data filed at 09/03/2022 1343 Gross per 24 hour  Intake --  Output 250 ml  Net -250 ml    Labs/Imaging Results for orders placed or performed during the hospital encounter of 09/03/22 (from the past 48 hour(s))  CBC with Differential     Status: Abnormal   Collection Time: 09/03/22 11:46 AM  Result Value Ref Range   WBC 17.6 (H) 4.0 - 10.5 K/uL   RBC 2.31 (L) 4.22 - 5.81 MIL/uL   Hemoglobin 7.9 (L) 13.0 - 17.0 g/dL   HCT 24.4 (L) 01.0 - 27.2 %   MCV 102.2 (H) 80.0 - 100.0 fL   MCH 34.2 (H) 26.0 - 34.0 pg   MCHC 33.5 30.0 - 36.0 g/dL   RDW 53.6 64.4 - 03.4 %   Platelets 292 150 - 400 K/uL   nRBC 0.0 0.0 - 0.2 %   Neutrophils Relative % 77 %   Neutro Abs 13.4 (H) 1.7 - 7.7 K/uL   Lymphocytes Relative 6 %   Lymphs Abs 1.1 0.7 - 4.0 K/uL   Monocytes Relative 15 %   Monocytes Absolute 2.6 (H) 0.1 - 1.0 K/uL   Eosinophils Relative 0 %   Eosinophils Absolute 0.0 0.0 - 0.5 K/uL   Basophils Relative 0 %   Basophils Absolute 0.1 0.0 - 0.1 K/uL   Immature Granulocytes 2 %   Abs Immature Granulocytes 0.39 (H) 0.00 - 0.07 K/uL    Comment: Performed at [facility], [address], [place], [place] [address]  Comprehensive metabolic panel     Status: Abnormal   Collection Time: 09/03/22 11:46 AM  Result Value Ref Range   Sodium 133 (L) 135 - 145 mmol/L   Potassium 3.8 3.5 - 5.1 mmol/L   Chloride 100 98 - 111 mmol/L   CO2 23 22 - 32 mmol/L   Glucose, Bld 171 (H) 70 - 99 mg/dL    Comment: Glucose reference range applies only to samples taken after fasting for at least 8 hours.   BUN 41 (H) 8 - 23 mg/dL   Creatinine, Ser 5.63 (H) 0.61 - 1.24 mg/dL   Calcium 8.9 8.9 - 87.5 mg/dL   Total Protein 6.5 6.5 - 8.1 g/dL   Albumin 3.3 (L) 3.5 - 5.0  g/dL   AST 39 15 - 41 U/L   ALT 27 0 - 44 U/L   Alkaline Phosphatase 37 (L) 38 - 126 U/L   Total Bilirubin 1.7 (H) 0.3 - 1.2 mg/dL   GFR, Estimated 54 (L) >60 mL/min    Comment: (NOTE) Calculated using the CKD-EPI Creatinine Equation (2021)    Anion gap 10  5 - 15    Comment: Performed at [facility], [address], [place], [place] [address]  Type and screen [facility]     Status: None   Collection Time: 09/03/22 12:11 PM  Result Value Ref Range   ABO/RH(D) B POS    Antibody Screen NEG    Sample Expiration      09/06/2022,2359 Performed at [facility], [address], [place], [place] [address]   I-stat chem 8, ED (not at [facility], [facility] or [facility])     Status: Abnormal   Collection Time: 09/03/22  1:01 PM  Result Value Ref Range   Sodium 136 135 - 145 mmol/L   Potassium 3.8 3.5 - 5.1 mmol/L   Chloride 100 98 - 111 mmol/L   BUN 40 (H) 8 - 23 mg/dL   Creatinine, Ser 4.25 (H) 0.61 - 1.24 mg/dL   Glucose, Bld 956 (H) 70 - 99 mg/dL    Comment: Glucose reference range applies only to samples taken after fasting for at least 8 hours.   Calcium, Ion 1.23 1.15 - 1.40 mmol/L   TCO2 24 22 - 32 mmol/L   Hemoglobin 8.2 (L) 13.0 - 17.0 g/dL   HCT 38.7 (L) 56.4 - 33.2 %   VAS [place] LOWER EXTREMITY VENOUS (DVT) (7a-7p)  Result Date: 09/03/2022  Lower Venous DVT Study Patient Name:  Brighton Ly.  Date of Exam:   09/03/2022 Medical Rec #: [id number]                    Accession #:    [id number] Date of Birth: Feb 02, 1951                    Patient Gender: M Patient Age:   41 years Exam Location:  [facility] Procedure:      VAS [place] LOWER EXTREMITY VENOUS (DVT) Referring Phys: Cathren Laine --------------------------------------------------------------------------------  Indications: Swelling, and Edema.  Comparison Study: no prior Performing Technologist: [place] Ponder RVS  Examination Guidelines: A complete evaluation includes B-mode imaging, spectral  Doppler, color Doppler, and power Doppler as needed of all accessible portions of each vessel. Bilateral testing is considered an integral part of a complete examination. Limited examinations for reoccurring indications may be performed as noted. The reflux portion of the exam is performed with the patient in reverse Trendelenburg.  +---------+---------------+---------+-----------+----------+--------------+ RIGHT    CompressibilityPhasicitySpontaneityPropertiesThrombus Aging +---------+---------------+---------+-----------+----------+--------------+ CFV      Full           Yes      Yes                                 +---------+---------------+---------+-----------+----------+--------------+ SFJ      Full                                                        +---------+---------------+---------+-----------+----------+--------------+ FV Prox  Full                                                        +---------+---------------+---------+-----------+----------+--------------+  FV Mid   Full                                                        +---------+---------------+---------+-----------+----------+--------------+ FV DistalFull           Yes      Yes                                 +---------+---------------+---------+-----------+----------+--------------+ PFV      Full                                                        +---------+---------------+---------+-----------+----------+--------------+ POP      Full                                                        +---------+---------------+---------+-----------+----------+--------------+ PTV      Full                                                        +---------+---------------+---------+-----------+----------+--------------+ PERO     Full           Yes      Yes                                 +---------+---------------+---------+-----------+----------+--------------+    +----+---------------+---------+-----------+----------+--------------+ LEFTCompressibilityPhasicitySpontaneityPropertiesThrombus Aging +----+---------------+---------+-----------+----------+--------------+ CFV Full           Yes      Yes                                 +----+---------------+---------+-----------+----------+--------------+     Summary: RIGHT: - There is no evidence of deep vein thrombosis in the lower extremity.  - No cystic structure found in the popliteal fossa.  LEFT: - No evidence of common femoral vein obstruction.  *See table(s) above for measurements and observations.    Preliminary    CT PELVIS W CONTRAST  Result Date: 09/03/2022 CLINICAL DATA:  Recent right hip arthroplasty revision 08/23/2022. Pain and swelling in right upper thigh extending to the knee. Some mottling and coloration as well. EXAM: CT PELVIS WITH CONTRAST TECHNIQUE: Multidetector CT imaging of the pelvis was performed using the standard protocol following the bolus administration of intravenous contrast. RADIATION DOSE REDUCTION: This exam was performed according to the departmental dose-optimization program which includes automated exposure control, adjustment of the mA and/or kV according to patient size and/or use of iterative reconstruction technique. CONTRAST:  75mL OMNIPAQUE IOHEXOL 350 MG/ML SOLN COMPARISON:  CT right hip 08/16/2022, AP right hip 04/24/2021 FINDINGS: Pelvis: Urinary Tract:  No abnormality visualized. Bowel:  Mild sigmoid diverticulosis. Vascular/Lymphatic: Moderate atherosclerotic calcifications. Reproductive:  The prostate measures up to 6.4 cm in transverse dimension, moderately enlarged Other: There is a moderate-to-large left inguinal fat containing hernia measuring up to 3.8 x 5.2 cm (transverse by AP). 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 There is now improvement in the asymmetry of the right acetabular lining, with the right  femoral head more appropriately located within the mid aspect of the right acetabular cup following interval revision from the prior 08/16/2022 CT. Within the limitations of metallic streak artifact, no perihardware lucency is seen to indicate hardware failure or loosening. There is a normal nutrient foramen extending through the posterior femoral diaphyseal cortex just distal to the femoral stem hardware (axial series 3, images 139 through 155 of 339). The superior portion of this was imaged on 08/16/2022 CT and is unchanged. Mild bilateral sacroiliac subchondral sclerosis and joint space narrowing. Minimal pubic symphysis joint space narrowing. -- Right femur: Mild-to-moderate medial and lateral component of the knee joint space narrowing with mild peripheral degenerative osteophytes. Minimal peripheral patellar degenerative osteophytes. On 08/16/2022 CT there was mild fluid abutting the posterolateral aspect of the right greater trochanter, measuring up to approximately 3.5 cm in transverse dimension and 6 cm in craniocaudal dimension. Following the interval surgical revision, there is a similar amount of mildly dense fluid in this region on the current CT, however there appears to be interval increase in mild fluid also extending more inferiorly, along the lateral border of the proximal vastus lateralis, likely in between the lateral fascial plane and lateral muscle body of the vastus lateralis (axial series 3, images 49 through 70). There is a small focus of air within this fluid (axial series 3, image 56). This fluid is contiguous with mildly dense fluid that extends along the more anterolateral quadriceps musculature, at the anterolateral aspect of the rectus femoris muscle (axial series 3, images 66 through 109), and also extends more distally to just above the level of the patella. Overall, there is mildly dense fluid may contain blood products measures up to approximately 9.5 x 3.7 x 29 cm in greatest  transverse by AP by craniocaudal dimensions. Within the more superior aspect of the right hip, at the level of the junction of the right gluteus maximus and right gluteus medius muscles (axial series 3 images 28 through 42) there is increased density suggesting an intramuscular hematoma measuring up to approximately 9 x 7 x 7 cm (transverse by AP by craniocaudal). IMPRESSION: Compared to 08/16/2022 CT: 1. 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 Within the limitations of metallic streak artifact, no perihardware lucency is seen to indicate hardware failure or loosening. 2. Interval increase in mildly dense fluid just lateral to the right greater trochanter which now extends distally along the lateral aspect of the right thigh, likely a postoperative seroma versus intramuscular hematoma along the anterolateral aspects of the right rectus femoris and vastus lateralis muscle bodies. There is a tiny focus of air within this fluid at the level of the proximal femoral diaphysis which may be postsurgical, however cannot entirely exclude a gas-forming infectious organism. Overall, this right thigh fluid measures up to approximately 9.5 x 3.7 x 29 cm in greatest transverse by AP by craniocaudal dimensions. Electronically Signed   By: Neita Garnet M.D.   On: 09/03/2022 15:30   CT FEMUR RIGHT W CONTRAST  Result Date: 09/03/2022 CLINICAL DATA:  Recent right hip arthroplasty revision 08/23/2022. Pain and swelling in right upper thigh extending to the knee.  Some mottling and coloration as well. EXAM: CT PELVIS WITH CONTRAST TECHNIQUE: Multidetector CT imaging of the pelvis was performed using the standard protocol following the bolus administration of intravenous contrast. RADIATION DOSE REDUCTION: This exam was performed according to the departmental dose-optimization program which includes automated exposure control, adjustment of the mA and/or kV according to patient size and/or use of  iterative reconstruction technique. CONTRAST:  75mL OMNIPAQUE IOHEXOL 350 MG/ML SOLN COMPARISON:  CT right hip 08/16/2022, AP right hip 04/24/2021 FINDINGS: Pelvis: Urinary Tract:  No abnormality visualized. Bowel:  Mild sigmoid diverticulosis. Vascular/Lymphatic: Moderate atherosclerotic calcifications. Reproductive: The prostate measures up to 6.4 cm in transverse dimension, moderately enlarged Other: There is a moderate-to-large left inguinal fat containing hernia measuring up to 3.8 x 5.2 cm (transverse by AP). 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 There is now improvement in the asymmetry of the right acetabular lining, with the right femoral head more appropriately located within the mid aspect of the right acetabular cup following interval revision from the prior 08/16/2022 CT. Within the limitations of metallic streak artifact, no perihardware lucency is seen to indicate hardware failure or loosening. There is a normal nutrient foramen extending through the posterior femoral diaphyseal cortex just distal to the femoral stem hardware (axial series 3, images 139 through 155 of 339). The superior portion of this was imaged on 08/16/2022 CT and is unchanged. Mild bilateral sacroiliac subchondral sclerosis and joint space narrowing. Minimal pubic symphysis joint space narrowing. -- Right femur: Mild-to-moderate medial and lateral component of the knee joint space narrowing with mild peripheral degenerative osteophytes. Minimal peripheral patellar degenerative osteophytes. On 08/16/2022 CT there was mild fluid abutting the posterolateral aspect of the right greater trochanter, measuring up to approximately 3.5 cm in transverse dimension and 6 cm in craniocaudal dimension. Following the interval surgical revision, there is a similar amount of mildly dense fluid in this region on the current CT, however there appears to be interval increase in mild fluid also  extending more inferiorly, along the lateral border of the proximal vastus lateralis, likely in between the lateral fascial plane and lateral muscle body of the vastus lateralis (axial series 3, images 49 through 70). There is a small focus of air within this fluid (axial series 3, image 56). This fluid is contiguous with mildly dense fluid that extends along the more anterolateral quadriceps musculature, at the anterolateral aspect of the rectus femoris muscle (axial series 3, images 66 through 109), and also extends more distally to just above the level of the patella. Overall, there is mildly dense fluid may contain blood products measures up to approximately 9.5 x 3.7 x 29 cm in greatest transverse by AP by craniocaudal dimensions. Within the more superior aspect of the right hip, at the level of the junction of the right gluteus maximus and right gluteus medius muscles (axial series 3 images 28 through 42) there is increased density suggesting an intramuscular hematoma measuring up to approximately 9 x 7 x 7 cm (transverse by AP by craniocaudal). IMPRESSION: Compared to 08/16/2022 CT: 1. 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 Within the limitations of metallic streak artifact, no perihardware lucency is seen to indicate hardware failure or loosening. 2. Interval increase in mildly dense fluid just lateral to the right greater trochanter which now extends distally along the lateral aspect of the right thigh, likely a postoperative seroma versus intramuscular hematoma along the anterolateral aspects of the right rectus femoris and vastus  lateralis muscle bodies. There is a tiny focus of air within this fluid at the level of the proximal femoral diaphysis which may be postsurgical, however cannot entirely exclude a gas-forming infectious organism. Overall, this right thigh fluid measures up to approximately 9.5 x 3.7 x 29 cm in greatest transverse by AP by craniocaudal  dimensions. Electronically Signed   By: Neita Garnet M.D.   On: 09/03/2022 15:30    Pending Labs Unresulted Labs (From admission, onward)    None       Vitals/Pain Today's Vitals   09/03/22 1357 09/03/22 1530 09/03/22 1630 09/03/22 1807  BP: (!) 113/59 115/70 (!) 99/50 (!) 95/55  Pulse: 82 89 84 84  Resp: 18  20 16   Temp: 98.1 F (36.7 C)   97.9 F (36.6 C)  TempSrc: Oral   Oral  SpO2: 99% 96% 96% 100%  PainSc: 5    8     Isolation Precautions No active isolations  Medications Medications  acetaminophen (TYLENOL) tablet 325-650 mg (has no administration in time range)  HYDROmorphone (DILAUDID) injection 0.5-1 mg (has no administration in time range)  oxyCODONE (Oxy IR/ROXICODONE) immediate release tablet 5-10 mg (has no administration in time range)  oxyCODONE (Oxy IR/ROXICODONE) immediate release tablet 10-15 mg (has no administration in time range)  acetaminophen (TYLENOL) tablet 1,000 mg (has no administration in time range)  methocarbamol (ROBAXIN) tablet 500 mg (has no administration in time range)    Or  methocarbamol (ROBAXIN) 500 mg in dextrose 5 % 50 mL IVPB (has no administration in time range)  ondansetron (ZOFRAN) tablet 4 mg (has no administration in time range)    Or  ondansetron (ZOFRAN) injection 4 mg (has no administration in time range)  zolpidem (AMBIEN) tablet 5 mg (has no administration in time range)  oxyCODONE (Oxy IR/ROXICODONE) immediate release tablet 5 mg (5 mg Oral Given 09/03/22 1223)  HYDROmorphone (DILAUDID) injection 0.5 mg (0.5 mg Intravenous Given 09/03/22 1226)  lactated ringers bolus 1,000 mL (0 mLs Intravenous Stopped 09/03/22 1357)  HYDROmorphone (DILAUDID) injection 0.5 mg (0.5 mg Intravenous Given 09/03/22 1400)  iohexol (OMNIPAQUE) 350 MG/ML injection 75 mL (75 mLs Intravenous Contrast Given 09/03/22 1446)  HYDROmorphone (DILAUDID) injection 0.5 mg (0.5 mg Intravenous Given 09/03/22 1803)  iohexol (OMNIPAQUE) 350 MG/ML injection 100  mL (100 mLs Intravenous Contrast Given 09/03/22 1909)    Mobility walks     Focused Assessments Circulation, right leg, possible surgical candidate   R Recommendations: See Admitting Provider Note  Report given to:   Additional Notes: pt had a hip replacement, then revision, and now the leg has pain, isn't mottled per say, but definitely geographical, patient is AOX4, cont, great historian and able to verbalize his needs

## 2022-09-03 NOTE — ED Provider Triage Note (Signed)
Emergency Medicine Provider Triage Evaluation Note  Norman Briseno Sharen Hint. , a 72 y.o. male  was evaluated in triage.  Pt complains of worsening pain and swelling in his right lower extremity since his surgery.  Patient had a right hip replacement revision on 08-23-2022.  He was discharged on 08/30/22.  Since then, he has been reporting worsening pain and swelling in his right lower extremity.  He is currently on Eliquis reports has been taking half the dose he supposed to given his lower hemoglobin while inpatient..  Review of Systems  Positive:  Negative:   Physical Exam  BP 118/61 (BP Location: Right Arm)   Pulse 85   Temp 98 F (36.7 C) (Oral)   Resp 20   SpO2 99%  Gen:   Awake, no distress   Resp:  Normal effort  MSK:   Significant swelling and mottling?/Old bruising?  Noted to the right upper leg extending into the knee.  He has dopplerable pulses both DP and PT.  He reports his sensations intact and he can wiggle his toes.  His anterior thigh compartment is firm.  Significant pain.  He also has swelling over his right knee as well. Other:    Medical Decision Making  Medically screening exam initiated at 11:59 AM.  Appropriate orders placed.  Norman July Sharen Hint. was informed that the remainder of the evaluation will be completed by another provider, this initial triage assessment does not replace that evaluation, and the importance of remaining in the ED until their evaluation is complete.  Attending came to assess patient at bedside. Recommended ordering CT. Spoke with [facility] who recommended starting off with CT pelvis with contrast and CT right lower extremity with contrast and proceeding onto angio if needed.   Norman Hudson, [place] 09/03/22 1202

## 2022-09-03 NOTE — H&P (Signed)
ORTHOPAEDIC H&P  Chief Complaint: Postoperative swelling after total hip arthroplasty revision concerning for postop hematoma  HPI: Norman Hudson. is a 72 y.o. 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  Postoperative course was complicated by postoperative anemia requiring transfusion as well as A-fib with RVR requiring cardioversion x 2.  He was just discharged on Monday home.  Since being home he has had marked increase in pain and swelling in the right thigh.  He has been on full dose anticoagulation with Eliquis per cardiology.  Pain has been poorly controlled.  Past Medical History:  Diagnosis Date   Acne    Anxiety    takes Xanax daily as needed   Arthritis    Atrial flutter, paroxysmal ([place])    History of kidney stones    Hyperlipidemia    takes Pravastatin daily   Hypertension    takes Amlodipine and Valsartan-HCTZ daily   Hypothyroidism    takes Synthroid daily   Inguinal hernia    left side   Internal hemorrhoids    Joint pain    Kidney stones    Pre-diabetes    Rosacea    TIA (transient ischemic attack) 06/2019   Past Surgical History:  Procedure Laterality Date   CARDIOVERSION N/A 08/28/2022   Procedure: CARDIOVERSION;  Surgeon: Adrian Prows, MD;  Location: [facility];  Service: Cardiovascular;  Laterality: N/A;   CARDIOVERSION N/A 08/29/2022   Procedure: CARDIOVERSION;  Surgeon: Adrian Prows, MD;  Location: [facility];  Service: Cardiovascular;  Laterality: N/A;   COLONOSCOPY     EXTRACORPOREAL SHOCK WAVE LITHOTRIPSY Right 12/27/2019   Procedure: EXTRACORPOREAL SHOCK WAVE LITHOTRIPSY (ESWL);  Surgeon: Cleon Gustin, MD;  Location: [facility];  Service: Urology;  Laterality: Right;   LITHOTRIPSY  09/2015   LUMBAR LAMINECTOMY WITH COFLEX 1 LEVEL N/A 07/07/2015   Procedure: LUMBAR FOUR-FIVE LUMBAR LAMINECTOMY WITH COFLEX;  Surgeon: Eustace Moore, MD;  Location: [facility];   Service: Neurosurgery;  Laterality: N/A;   TONSILLECTOMY     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   Procedure: TOTAL HIP ARTHROPLASTY;  Surgeon: Earlie Server, MD;  Location: [place];  Service: Orthopedics;  Laterality: Left;   TOTAL HIP ARTHROPLASTY Right 04/24/2021   Procedure: TOTAL HIP ARTHROPLASTY;  Surgeon: Earlie Server, MD;  Location: [facility];  Service: Orthopedics;  Laterality: Right;   TOTAL HIP REVISION Right 08/23/2022   Procedure: TOTAL HIP REVISION. HEAD AND LINER EXCHANGE. POSSIBLE CUP REVISION;  Surgeon: Willaim Sheng, MD;  Location: [facility];  Service: Orthopedics;  Laterality: Right;   Social History   Socioeconomic History   Marital status: Married    Spouse name: Not on file   Number of children: 2   Years of education: Not on file   Highest education level: Bachelor's degree (e.g., BA, AB, BS)  Occupational History   Not on file  Tobacco Use   Smoking status: Former    Packs/day: 1.00    Years: 15.00    Total pack years: 15.00    Types: Cigarettes    Quit date: 02/12/1989    Years since quitting: 33.5   Smokeless tobacco: Never   Tobacco comments:    quit smoking in 1991  Vaping Use   Vaping Use: Never used  Substance and Sexual Activity   Alcohol use: No   Drug use: No   Sexual activity: Not on file  Other Topics Concern  Not on file  Social History Narrative   Lives at home with wife   4 cups of caffeine/day   Social Determinants of Health   Financial Resource Strain: Not on file  Food Insecurity: No Food Insecurity (08/23/2022)   Hunger Vital Sign    Worried About Running Out of Food in the Last Year: Never true    Ran Out of Food in the Last Year: Never true  Transportation Needs: No Transportation Needs (08/24/2022)   PRAPARE - Hydrologist (Medical): No    Lack of Transportation (Non-Medical): No  Physical Activity: Not on file  Stress: Not on file  Social Connections: Not on file   Family History   Problem Relation Age of Onset   Hypertension Father    Sleep disorder Father    CVA Father    Cancer Maternal Grandfather    Hypertension Paternal Grandmother    Diabetes Paternal Grandmother    Alcoholism Mother    Hypertension Sister    Aneurysm Sister    Atrial fibrillation Sister    Other Neg Hx        hypogonadism   Sleep apnea Neg Hx    Allergies  Allergen Reactions   Bactrim [Sulfamethoxazole-Trimethoprim] Hives and Itching    "threw me for a loop, hives, itching, extreme fatigue"     Positive ROS: All other systems have been reviewed and were otherwise negative with the exception of those mentioned in the HPI and as above.  Physical Exam: General: Alert, no acute distress Cardiovascular: No pedal edema Respiratory: No cyanosis, no use of accessory musculature Skin: No lesions in the area of chief complaint Neurologic: Sensation intact distally Psychiatric: Patient is competent for consent with normal mood and affect  MUSCULOSKELETAL:  Right hip incision clean, dry, and intact marked swelling about the right thigh to the level of the knee.  Intact ankle dorsiflexion plantarflexion distally sniffing and pain with hip and knee discomfort due to swelling.    Assessment: Right hip arthroplasty revision 08/23/2022 with concern for postoperative hematoma   Plan: Given significant increase in postoperative swelling last few days have concern for either arterial bleed versus venous ooze.  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  Patient to obtain a CT angiogram and if there is a appreciable arterial bleed we will plan for embolization.  If there is a hematoma we will plan for aspiration and drain placement by interventional radiology.  Patient will be admitted for observation and pain control.    Willaim Sheng, MD  Contact information:    [id number] 7am-5pm epic message Dr. Zachery Dakins, or call office for patient follow up: [phone] After hours and holidays please check [email or web address] for group call information for [facility]

## 2022-09-03 NOTE — ED Notes (Signed)
Patient transported to Ultrasound 

## 2022-09-03 NOTE — ED Triage Notes (Signed)
Pt came in via POV d/t his swelling not going down since his Rt hip replacement on the 15th this month. Pt states that his pain is 10/10, his Rt thigh is severely swollen in triage with some splotchy skin discoloration. Pt endorses being able to bear weight but it is very painful for him to do so. Can feel his Rt foot & doppler was used and verified pulse was present.

## 2022-09-03 NOTE — ED Provider Notes (Signed)
[facility] Provider Note   CSN: [id number] Arrival date & time: 09/03/22  1006     History  Chief Complaint  Patient presents with   Rt Hip replacement Issues    Norman Hudson. is a 72 y.o. male.  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 Swelling has been constant, severe, possibly worse in past 3-4 days. C/o severe pain to hip and thigh area that has been persistent since his surgery and the swelling.  Indicates post surgery hgb as low as 6-7 and did get transfusion.   Is on anticoag therapy w eliquis - denies other abnormal bruising or bleeding. No new numbness/weakness of extremity. No fever or chills.   The history is provided by the patient, medical records and a relative.       Home Medications Prior to Admission medications   Medication Sig Start Date End Date Taking? Authorizing Provider  acetaminophen (TYLENOL) 325 MG tablet Take 650 mg by mouth every 6 (six) hours as needed for moderate pain.    [provider]  cholecalciferol (VITAMIN D3) 25 MCG (1000 UT) tablet Take 1,000 Units by mouth daily.    [provider]  clonazePAM (KLONOPIN) 0.5 MG tablet Take 0.5-1 tablets (0.25-0.5 mg total) by mouth at bedtime as needed for anxiety. 07/13/22   Frann Rider, NP  ELIQUIS 5 MG TABS tablet TAKE ONE TABLET BY MOUTH TWICE DAILY 08/23/22   Tolia, Sunit, DO  flecainide (TAMBOCOR) 50 MG tablet Take 1 tablet (50 mg total) by mouth 2 (two) times daily. 08/29/22   Adrian Prows, MD  hydrALAZINE (APRESOLINE) 25 MG tablet Take 1 tablet (25 mg total) by mouth 3 (three) times daily as needed. Take as needed for blood pressure greater than 150/90 07/08/22 11/05/22  Ernst Spell, NP  hydrochlorothiazide (HYDRODIURIL) 25 MG tablet Take 1 tablet (25 mg total) by mouth every morning. 08/23/22 11/21/22  Tolia, Sunit, DO  metFORMIN (GLUCOPHAGE) 500 MG  tablet Take 500 mg by mouth daily with breakfast.    [provider]  methocarbamol (ROBAXIN) 500 MG tablet Take 1 tablet (500 mg total) by mouth every 8 (eight) hours as needed for up to 10 days for muscle spasms. 08/27/22 09/06/22  Willaim Sheng, MD  metoprolol succinate (TOPROL-XL) 25 MG 24 hr tablet Take 1 tablet (25 mg total) by mouth every morning. 08/23/22 11/21/22  Tolia, Sunit, DO  Multiple Vitamins-Minerals (MULTIVITAMIN WITH MINERALS) tablet Take 1 tablet by mouth daily.    [provider]  Multiple Vitamins-Minerals (PRESERVISION AREDS 2 PO) Take 1 capsule by mouth daily.    [provider]  olmesartan (BENICAR) 40 MG tablet Take 1 tablet (40 mg total) by mouth daily at 10 pm. 08/23/22 11/21/22  Tolia, Sunit, DO  ondansetron (ZOFRAN) 4 MG tablet Take 1 tablet (4 mg total) by mouth every 8 (eight) hours as needed for up to 14 days for nausea or vomiting. 08/27/22 09/10/22  Willaim Sheng, MD  oxyCODONE (ROXICODONE) 5 MG immediate release tablet Take 1-2 tablets (5-10 mg total) by mouth every 4 (four) hours as needed for up to 7 days for severe pain or moderate pain. 08/27/22 09/03/22  Willaim Sheng, MD  pyridOXINE (VITAMIN B-6) 100 MG tablet Take 100 mg by mouth daily.    [provider]  rosuvastatin (CRESTOR) 20 MG tablet TAKE ONE TABLET AT BEDTIME. 04/30/22   Terri Skains,  Sunit, DO  spironolactone (ALDACTONE) 25 MG tablet Take 1 tablet (25 mg total) by mouth daily. 05/20/22 05/15/23  Tolia, Sunit, DO  SYNTHROID 88 MCG tablet Take 88 mcg by mouth daily before breakfast. 06/11/22   [provider]  valACYclovir (VALTREX) 1000 MG tablet Take 1,000 mg by mouth daily as needed (for herpes flare).     [provider]      Allergies    Bactrim [sulfamethoxazole-trimethoprim]    Review of Systems   Review of Systems  Constitutional:  Negative for chills and fever.  Respiratory:  Negative for shortness of breath.   Cardiovascular:   Positive for leg swelling. Negative for chest pain.  Gastrointestinal:  Negative for abdominal pain, nausea and vomiting.  Genitourinary:  Negative for flank pain.  Musculoskeletal:  Negative for back pain.  Skin:  Negative for rash.  Neurological:  Negative for numbness and headaches.  Hematological:  Bruises/bleeds easily.    Physical Exam Updated Vital Signs BP 115/70   Pulse 89   Temp 98.1 F (36.7 C) (Oral)   Resp 18   SpO2 96%  Physical Exam Vitals and nursing note reviewed.  Constitutional:      Appearance: Normal appearance. He is well-developed.  HENT:     Head: Atraumatic.     Nose: Nose normal.     Mouth/Throat:     Mouth: Mucous membranes are moist.     Pharynx: Oropharynx is clear.  Eyes:     General: No scleral icterus.    Conjunctiva/sclera: Conjunctivae normal.  Neck:     Trachea: No tracheal deviation.  Cardiovascular:     Rate and Rhythm: Normal rate.     Pulses: Normal pulses.     Heart sounds: Normal heart sounds. No murmur heard.    No friction rub. No gallop.  Pulmonary:     Effort: Pulmonary effort is normal. No accessory muscle usage or respiratory distress.     Breath sounds: Normal breath sounds.  Abdominal:     General: Bowel sounds are normal. There is no distension.     Palpations: Abdomen is soft.     Tenderness: There is no abdominal tenderness.  Musculoskeletal:     Cervical back: Normal range of motion and neck supple. No rigidity.     Comments: Marked swelling to right thigh diffusely. Distal pulses palp. Lower leg and foot of normal color and warmth. Some mottling/bruising to skin of thigh. No crepitus. Compartments of right thigh appear quite swollen, but are not extremely tense or 'woody'.  Skin:    General: Skin is warm and dry.     Findings: No rash.  Neurological:     Mental Status: He is alert.     Comments: Alert, speech clear. RLE nvi with intact motor/sens fxn.   Psychiatric:        Mood and Affect: Mood normal.      ED Results / Procedures / Treatments   Labs (all labs ordered are listed, but only abnormal results are displayed) Results for orders placed or performed during the hospital encounter of 09/03/22  CBC with Differential  Result Value Ref Range   WBC 17.6 (H) 4.0 - 10.5 K/uL   RBC 2.31 (L) 4.22 - 5.81 MIL/uL   Hemoglobin 7.9 (L) 13.0 - 17.0 g/dL   HCT 23.6 (L) 39.0 - 52.0 %   MCV 102.2 (H) 80.0 - 100.0 fL   MCH 34.2 (H) 26.0 - 34.0 pg   MCHC 33.5 30.0 - 36.0 g/dL  RDW 13.6 11.5 - 15.5 %   Platelets 292 150 - 400 K/uL   nRBC 0.0 0.0 - 0.2 %   Neutrophils Relative % 77 %   Neutro Abs 13.4 (H) 1.7 - 7.7 K/uL   Lymphocytes Relative 6 %   Lymphs Abs 1.1 0.7 - 4.0 K/uL   Monocytes Relative 15 %   Monocytes Absolute 2.6 (H) 0.1 - 1.0 K/uL   Eosinophils Relative 0 %   Eosinophils Absolute 0.0 0.0 - 0.5 K/uL   Basophils Relative 0 %   Basophils Absolute 0.1 0.0 - 0.1 K/uL   Immature Granulocytes 2 %   Abs Immature Granulocytes 0.39 (H) 0.00 - 0.07 K/uL  Comprehensive metabolic panel  Result Value Ref Range   Sodium 133 (L) 135 - 145 mmol/L   Potassium 3.8 3.5 - 5.1 mmol/L   Chloride 100 98 - 111 mmol/L   CO2 23 22 - 32 mmol/L   Glucose, Bld 171 (H) 70 - 99 mg/dL   BUN 41 (H) 8 - 23 mg/dL   Creatinine, Ser 1.38 (H) 0.61 - 1.24 mg/dL   Calcium 8.9 8.9 - 10.3 mg/dL   Total Protein 6.5 6.5 - 8.1 g/dL   Albumin 3.3 (L) 3.5 - 5.0 g/dL   AST 39 15 - 41 U/L   ALT 27 0 - 44 U/L   Alkaline Phosphatase 37 (L) 38 - 126 U/L   Total Bilirubin 1.7 (H) 0.3 - 1.2 mg/dL   GFR, Estimated 54 (L) >60 mL/min   Anion gap 10 5 - 15  I-stat chem 8, ED (not at [facility], [facility] or [facility])  Result Value Ref Range   Sodium 136 135 - 145 mmol/L   Potassium 3.8 3.5 - 5.1 mmol/L   Chloride 100 98 - 111 mmol/L   BUN 40 (H) 8 - 23 mg/dL   Creatinine, Ser 1.30 (H) 0.61 - 1.24 mg/dL   Glucose, Bld 160 (H) 70 - 99 mg/dL   Calcium, Ion 1.23 1.15 - 1.40 mmol/L   TCO2 24 22 - 32 mmol/L   Hemoglobin 8.2 (L) 13.0  - 17.0 g/dL   HCT 24.0 (L) 39.0 - 52.0 %  Type and screen [place]  Result Value Ref Range   ABO/RH(D) B POS    Antibody Screen NEG    Sample Expiration      09/06/2022,2359 Performed at [facility], [address], [place], [place] [address]    CT PELVIS W CONTRAST  Result Date: 09/03/2022 CLINICAL DATA:  Recent right hip arthroplasty revision 08/23/2022. Pain and swelling in right upper thigh extending to the knee. Some mottling and coloration as well. EXAM: CT PELVIS WITH CONTRAST TECHNIQUE: Multidetector CT imaging of the pelvis was performed using the standard protocol following the bolus administration of intravenous contrast. RADIATION DOSE REDUCTION: This exam was performed according to the departmental dose-optimization program which includes automated exposure control, adjustment of the mA and/or kV according to patient size and/or use of iterative reconstruction technique. CONTRAST:  79m OMNIPAQUE IOHEXOL 350 MG/ML SOLN COMPARISON:  CT right hip 08/16/2022, AP right hip 04/24/2021 FINDINGS: Pelvis: Urinary Tract:  No abnormality visualized. Bowel:  Mild sigmoid diverticulosis. Vascular/Lymphatic: Moderate atherosclerotic calcifications. Reproductive: The prostate measures up to 6.4 cm in transverse dimension, moderately enlarged Other: There is a moderate-to-large left inguinal fat containing hernia measuring up to 3.8 x 5.2 cm (transverse by AP). 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 There is now improvement in the  asymmetry of the right acetabular lining, with the right femoral head more appropriately located within the mid aspect of the right acetabular cup following interval revision from the prior 08/16/2022 CT. Within the limitations of metallic streak artifact, no perihardware lucency is seen to indicate hardware failure or loosening. There is a normal nutrient foramen extending through the posterior  femoral diaphyseal cortex just distal to the femoral stem hardware (axial series 3, images 139 through 155 of 339). The superior portion of this was imaged on 08/16/2022 CT and is unchanged. Mild bilateral sacroiliac subchondral sclerosis and joint space narrowing. Minimal pubic symphysis joint space narrowing. -- Right femur: Mild-to-moderate medial and lateral component of the knee joint space narrowing with mild peripheral degenerative osteophytes. Minimal peripheral patellar degenerative osteophytes. On 08/16/2022 CT there was mild fluid abutting the posterolateral aspect of the right greater trochanter, measuring up to approximately 3.5 cm in transverse dimension and 6 cm in craniocaudal dimension. Following the interval surgical revision, there is a similar amount of mildly dense fluid in this region on the current CT, however there appears to be interval increase in mild fluid also extending more inferiorly, along the lateral border of the proximal vastus lateralis, likely in between the lateral fascial plane and lateral muscle body of the vastus lateralis (axial series 3, images 49 through 70). There is a small focus of air within this fluid (axial series 3, image 56). This fluid is contiguous with mildly dense fluid that extends along the more anterolateral quadriceps musculature, at the anterolateral aspect of the rectus femoris muscle (axial series 3, images 66 through 109), and also extends more distally to just above the level of the patella. Overall, there is mildly dense fluid may contain blood products measures up to approximately 9.5 x 3.7 x 29 cm in greatest transverse by AP by craniocaudal dimensions. Within the more superior aspect of the right hip, at the level of the junction of the right gluteus maximus and right gluteus medius muscles (axial series 3 images 28 through 42) there is increased density suggesting an intramuscular hematoma measuring up to approximately 9 x 7 x 7 cm (transverse by  AP by craniocaudal). IMPRESSION: Compared to 08/16/2022 CT: 1. 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 Within the limitations of metallic streak artifact, no perihardware lucency is seen to indicate hardware failure or loosening. 2. Interval increase in mildly dense fluid just lateral to the right greater trochanter which now extends distally along the lateral aspect of the right thigh, likely a postoperative seroma versus intramuscular hematoma along the anterolateral aspects of the right rectus femoris and vastus lateralis muscle bodies. There is a tiny focus of air within this fluid at the level of the proximal femoral diaphysis which may be postsurgical, however cannot entirely exclude a gas-forming infectious organism. Overall, this right thigh fluid measures up to approximately 9.5 x 3.7 x 29 cm in greatest transverse by AP by craniocaudal dimensions. Electronically Signed   By: Yvonne Kendall M.D.   On: 09/03/2022 15:30   CT FEMUR RIGHT W CONTRAST  Result Date: 09/03/2022 CLINICAL DATA:  Recent right hip arthroplasty revision 08/23/2022. Pain and swelling in right upper thigh extending to the knee. Some mottling and coloration as well. EXAM: CT PELVIS WITH CONTRAST TECHNIQUE: Multidetector CT imaging of the pelvis was performed using the standard protocol following the bolus administration of intravenous contrast. RADIATION DOSE REDUCTION: This exam was performed according to the departmental dose-optimization program which includes automated exposure control,  adjustment of the mA and/or kV according to patient size and/or use of iterative reconstruction technique. CONTRAST:  38m OMNIPAQUE IOHEXOL 350 MG/ML SOLN COMPARISON:  CT right hip 08/16/2022, AP right hip 04/24/2021 FINDINGS: Pelvis: Urinary Tract:  No abnormality visualized. Bowel:  Mild sigmoid diverticulosis. Vascular/Lymphatic: Moderate atherosclerotic calcifications. Reproductive: The prostate measures up to  6.4 cm in transverse dimension, moderately enlarged Other: There is a moderate-to-large left inguinal fat containing hernia measuring up to 3.8 x 5.2 cm (transverse by AP). 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 There is now improvement in the asymmetry of the right acetabular lining, with the right femoral head more appropriately located within the mid aspect of the right acetabular cup following interval revision from the prior 08/16/2022 CT. Within the limitations of metallic streak artifact, no perihardware lucency is seen to indicate hardware failure or loosening. There is a normal nutrient foramen extending through the posterior femoral diaphyseal cortex just distal to the femoral stem hardware (axial series 3, images 139 through 155 of 339). The superior portion of this was imaged on 08/16/2022 CT and is unchanged. Mild bilateral sacroiliac subchondral sclerosis and joint space narrowing. Minimal pubic symphysis joint space narrowing. -- Right femur: Mild-to-moderate medial and lateral component of the knee joint space narrowing with mild peripheral degenerative osteophytes. Minimal peripheral patellar degenerative osteophytes. On 08/16/2022 CT there was mild fluid abutting the posterolateral aspect of the right greater trochanter, measuring up to approximately 3.5 cm in transverse dimension and 6 cm in craniocaudal dimension. Following the interval surgical revision, there is a similar amount of mildly dense fluid in this region on the current CT, however there appears to be interval increase in mild fluid also extending more inferiorly, along the lateral border of the proximal vastus lateralis, likely in between the lateral fascial plane and lateral muscle body of the vastus lateralis (axial series 3, images 49 through 70). There is a small focus of air within this fluid (axial series 3, image 56). This fluid is contiguous with mildly dense fluid that  extends along the more anterolateral quadriceps musculature, at the anterolateral aspect of the rectus femoris muscle (axial series 3, images 66 through 109), and also extends more distally to just above the level of the patella. Overall, there is mildly dense fluid may contain blood products measures up to approximately 9.5 x 3.7 x 29 cm in greatest transverse by AP by craniocaudal dimensions. Within the more superior aspect of the right hip, at the level of the junction of the right gluteus maximus and right gluteus medius muscles (axial series 3 images 28 through 42) there is increased density suggesting an intramuscular hematoma measuring up to approximately 9 x 7 x 7 cm (transverse by AP by craniocaudal). IMPRESSION: Compared to 08/16/2022 CT: 1. 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 Within the limitations of metallic streak artifact, no perihardware lucency is seen to indicate hardware failure or loosening. 2. Interval increase in mildly dense fluid just lateral to the right greater trochanter which now extends distally along the lateral aspect of the right thigh, likely a postoperative seroma versus intramuscular hematoma along the anterolateral aspects of the right rectus femoris and vastus lateralis muscle bodies. There is a tiny focus of air within this fluid at the level of the proximal femoral diaphysis which may be postsurgical, however cannot entirely exclude a gas-forming infectious organism. Overall, this right thigh fluid measures up to approximately 9.5 x 3.7 x 29 cm in  greatest transverse by AP by craniocaudal dimensions. Electronically Signed   By: Yvonne Kendall M.D.   On: 09/03/2022 15:30   DG HIP UNILAT W OR W/O PELVIS 2-3 VIEWS RIGHT  Result Date: 08/23/2022 CLINICAL DATA:  Status post right hip replacement EXAM: DG HIP (WITH OR WITHOUT PELVIS) 2-3V RIGHT COMPARISON:  None Available. FINDINGS: Patient is status post right hip replacement with  postoperative air in the soft tissues. The acetabular and femoral component are in good position. IMPRESSION: Right hip replacement as above. Electronically Signed   By: Dorise Bullion III M.D.   On: 08/23/2022 16:09   DG C-Arm 1-60 Min-No Report  Result Date: 08/23/2022 Fluoroscopy was utilized by the requesting physician.  No radiographic interpretation.   DG C-Arm 1-60 Min-No Report  Result Date: 08/23/2022 Fluoroscopy was utilized by the requesting physician.  No radiographic interpretation.   CT HIP RIGHT WO CONTRAST  Result Date: 08/16/2022 CLINICAL DATA:  Hardware dysfunction squeaking and grinding sensation associated with right total hip prosthesis. EXAM: CT OF THE RIGHT HIP WITHOUT CONTRAST TECHNIQUE: Multidetector CT imaging of the right hip was performed according to the standard protocol. Multiplanar CT image reconstructions were also generated. RADIATION DOSE REDUCTION: This exam was performed according to the departmental dose-optimization program which includes automated exposure control, adjustment of the mA and/or kV according to patient size and/or use of iterative reconstruction technique. COMPARISON:  Radiographs 04/24/2021 FINDINGS: Bones/Joint/Cartilage Full-thickness craniocaudad loss of the polyethylene liner of the acetabular shell component of the hip prosthesis, with the metal of the femoral component abutting the metal of the shell component as shown on image 53 series 6. The appearance suggests advanced polymer wear. There is some degenerative subcortical cyst formation along the superior acetabulum, but no large/substantial lytic lesion to suggest particulate disease involving the bony structures. No substantial abnormal lucency or complicating feature involving the stem component of the femoral prosthesis. Spinous process fixator partially imaged at the L4-5 level. The borderline bilateral foraminal stenosis at L5-S1 due to facet spurring. There is fluid density along the  lateral margin of the greater trochanter, query trochanteric bursitis. Ligaments Suboptimally assessed by CT. Muscles and Tendons Unremarkable Soft tissues Atherosclerosis is present, including aortoiliac atherosclerotic disease. Scattered sigmoid colon diverticula. IMPRESSION: 1. Full-thickness craniocaudad loss/wear of the polyethylene liner of the acetabular shell component of the right hip prosthesis. 2. Fluid density along the lateral margin of the greater trochanter, query trochanteric bursitis. 3. Borderline bilateral foraminal stenosis at L5-S1 due to facet spurring. 4. Spinous process fixator partially imaged at the L4-5 level. 5. Scattered sigmoid colon diverticula. 6. Atherosclerosis. 7. There is some degenerative subcortical cyst formation along the superior acetabulum, but no large/substantial lytic lesion to suggest particulate disease involving the bony structures. Aortic Atherosclerosis (ICD10-I70.0). Electronically Signed   By: Van Clines M.D.   On: 08/16/2022 15:03   PCV ECHOCARDIOGRAM COMPLETE  Result Date: 08/12/2022 Echocardiogram 08/12/2022: Normal LV systolic function with visual EF 60-65%. Left ventricle cavity is normal in size. Normal left ventricular wall thickness. Normal global wall motion. Normal diastolic filling pattern, normal LAP. Aortic valve sclerosis without stenosis. Mild tricuspid regurgitation. No evidence of pulmonary hypertension. Compared to 04/29/2021 otherwise no significant change.     EKG None  Radiology CT PELVIS W CONTRAST  Result Date: 09/03/2022 CLINICAL DATA:  Recent right hip arthroplasty revision 08/23/2022. Pain and swelling in right upper thigh extending to the knee. Some mottling and coloration as well. EXAM: CT PELVIS WITH CONTRAST TECHNIQUE: Multidetector CT imaging of  the pelvis was performed using the standard protocol following the bolus administration of intravenous contrast. RADIATION DOSE REDUCTION: This exam was performed according  to the departmental dose-optimization program which includes automated exposure control, adjustment of the mA and/or kV according to patient size and/or use of iterative reconstruction technique. CONTRAST:  61m OMNIPAQUE IOHEXOL 350 MG/ML SOLN COMPARISON:  CT right hip 08/16/2022, AP right hip 04/24/2021 FINDINGS: Pelvis: Urinary Tract:  No abnormality visualized. Bowel:  Mild sigmoid diverticulosis. Vascular/Lymphatic: Moderate atherosclerotic calcifications. Reproductive: The prostate measures up to 6.4 cm in transverse dimension, moderately enlarged Other: There is a moderate-to-large left inguinal fat containing hernia measuring up to 3.8 x 5.2 cm (transverse by AP). 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 There is now improvement in the asymmetry of the right acetabular lining, with the right femoral head more appropriately located within the mid aspect of the right acetabular cup following interval revision from the prior 08/16/2022 CT. Within the limitations of metallic streak artifact, no perihardware lucency is seen to indicate hardware failure or loosening. There is a normal nutrient foramen extending through the posterior femoral diaphyseal cortex just distal to the femoral stem hardware (axial series 3, images 139 through 155 of 339). The superior portion of this was imaged on 08/16/2022 CT and is unchanged. Mild bilateral sacroiliac subchondral sclerosis and joint space narrowing. Minimal pubic symphysis joint space narrowing. -- Right femur: Mild-to-moderate medial and lateral component of the knee joint space narrowing with mild peripheral degenerative osteophytes. Minimal peripheral patellar degenerative osteophytes. On 08/16/2022 CT there was mild fluid abutting the posterolateral aspect of the right greater trochanter, measuring up to approximately 3.5 cm in transverse dimension and 6 cm in craniocaudal dimension. Following the interval surgical  revision, there is a similar amount of mildly dense fluid in this region on the current CT, however there appears to be interval increase in mild fluid also extending more inferiorly, along the lateral border of the proximal vastus lateralis, likely in between the lateral fascial plane and lateral muscle body of the vastus lateralis (axial series 3, images 49 through 70). There is a small focus of air within this fluid (axial series 3, image 56). This fluid is contiguous with mildly dense fluid that extends along the more anterolateral quadriceps musculature, at the anterolateral aspect of the rectus femoris muscle (axial series 3, images 66 through 109), and also extends more distally to just above the level of the patella. Overall, there is mildly dense fluid may contain blood products measures up to approximately 9.5 x 3.7 x 29 cm in greatest transverse by AP by craniocaudal dimensions. Within the more superior aspect of the right hip, at the level of the junction of the right gluteus maximus and right gluteus medius muscles (axial series 3 images 28 through 42) there is increased density suggesting an intramuscular hematoma measuring up to approximately 9 x 7 x 7 cm (transverse by AP by craniocaudal). IMPRESSION: Compared to 08/16/2022 CT: 1. 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 Within the limitations of metallic streak artifact, no perihardware lucency is seen to indicate hardware failure or loosening. 2. Interval increase in mildly dense fluid just lateral to the right greater trochanter which now extends distally along the lateral aspect of the right thigh, likely a postoperative seroma versus intramuscular hematoma along the anterolateral aspects of the right rectus femoris and vastus lateralis muscle bodies. There is a tiny focus of air within this fluid at the level  of the proximal femoral diaphysis which may be postsurgical, however cannot entirely exclude a  gas-forming infectious organism. Overall, this right thigh fluid measures up to approximately 9.5 x 3.7 x 29 cm in greatest transverse by AP by craniocaudal dimensions. Electronically Signed   By: Yvonne Kendall M.D.   On: 09/03/2022 15:30   CT FEMUR RIGHT W CONTRAST  Result Date: 09/03/2022 CLINICAL DATA:  Recent right hip arthroplasty revision 08/23/2022. Pain and swelling in right upper thigh extending to the knee. Some mottling and coloration as well. EXAM: CT PELVIS WITH CONTRAST TECHNIQUE: Multidetector CT imaging of the pelvis was performed using the standard protocol following the bolus administration of intravenous contrast. RADIATION DOSE REDUCTION: This exam was performed according to the departmental dose-optimization program which includes automated exposure control, adjustment of the mA and/or kV according to patient size and/or use of iterative reconstruction technique. CONTRAST:  53m OMNIPAQUE IOHEXOL 350 MG/ML SOLN COMPARISON:  CT right hip 08/16/2022, AP right hip 04/24/2021 FINDINGS: Pelvis: Urinary Tract:  No abnormality visualized. Bowel:  Mild sigmoid diverticulosis. Vascular/Lymphatic: Moderate atherosclerotic calcifications. Reproductive: The prostate measures up to 6.4 cm in transverse dimension, moderately enlarged Other: There is a moderate-to-large left inguinal fat containing hernia measuring up to 3.8 x 5.2 cm (transverse by AP). 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 There is now improvement in the asymmetry of the right acetabular lining, with the right femoral head more appropriately located within the mid aspect of the right acetabular cup following interval revision from the prior 08/16/2022 CT. Within the limitations of metallic streak artifact, no perihardware lucency is seen to indicate hardware failure or loosening. There is a normal nutrient foramen extending through the posterior femoral diaphyseal cortex just distal to  the femoral stem hardware (axial series 3, images 139 through 155 of 339). The superior portion of this was imaged on 08/16/2022 CT and is unchanged. Mild bilateral sacroiliac subchondral sclerosis and joint space narrowing. Minimal pubic symphysis joint space narrowing. -- Right femur: Mild-to-moderate medial and lateral component of the knee joint space narrowing with mild peripheral degenerative osteophytes. Minimal peripheral patellar degenerative osteophytes. On 08/16/2022 CT there was mild fluid abutting the posterolateral aspect of the right greater trochanter, measuring up to approximately 3.5 cm in transverse dimension and 6 cm in craniocaudal dimension. Following the interval surgical revision, there is a similar amount of mildly dense fluid in this region on the current CT, however there appears to be interval increase in mild fluid also extending more inferiorly, along the lateral border of the proximal vastus lateralis, likely in between the lateral fascial plane and lateral muscle body of the vastus lateralis (axial series 3, images 49 through 70). There is a small focus of air within this fluid (axial series 3, image 56). This fluid is contiguous with mildly dense fluid that extends along the more anterolateral quadriceps musculature, at the anterolateral aspect of the rectus femoris muscle (axial series 3, images 66 through 109), and also extends more distally to just above the level of the patella. Overall, there is mildly dense fluid may contain blood products measures up to approximately 9.5 x 3.7 x 29 cm in greatest transverse by AP by craniocaudal dimensions. Within the more superior aspect of the right hip, at the level of the junction of the right gluteus maximus and right gluteus medius muscles (axial series 3 images 28 through 42) there is increased density suggesting an intramuscular hematoma measuring up to approximately 9 x 7 x  7 cm (transverse by AP by craniocaudal). IMPRESSION: Compared  to 08/16/2022 CT: 1. 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 Within the limitations of metallic streak artifact, no perihardware lucency is seen to indicate hardware failure or loosening. 2. Interval increase in mildly dense fluid just lateral to the right greater trochanter which now extends distally along the lateral aspect of the right thigh, likely a postoperative seroma versus intramuscular hematoma along the anterolateral aspects of the right rectus femoris and vastus lateralis muscle bodies. There is a tiny focus of air within this fluid at the level of the proximal femoral diaphysis which may be postsurgical, however cannot entirely exclude a gas-forming infectious organism. Overall, this right thigh fluid measures up to approximately 9.5 x 3.7 x 29 cm in greatest transverse by AP by craniocaudal dimensions. Electronically Signed   By: Yvonne Kendall M.D.   On: 09/03/2022 15:30    Procedures Procedures    Medications Ordered in ED Medications  oxyCODONE (Oxy IR/ROXICODONE) immediate release tablet 5 mg (5 mg Oral Given 09/03/22 1223)  HYDROmorphone (DILAUDID) injection 0.5 mg (0.5 mg Intravenous Given 09/03/22 1226)  lactated ringers bolus 1,000 mL (0 mLs Intravenous Stopped 09/03/22 1357)  HYDROmorphone (DILAUDID) injection 0.5 mg (0.5 mg Intravenous Given 09/03/22 1400)  iohexol (OMNIPAQUE) 350 MG/ML injection 75 mL (75 mLs Intravenous Contrast Given 09/03/22 1446)    ED Course/ Medical Decision Making/ A&P Clinical Course as of 09/03/22 1638  Fri Sep 03, 2022  1623 R total hip surgery on 1/15 who presents with R thigh swelling. CT shows large hematoma present. In significant pain so ortho consulted for compartment syndrome. They also want vascular study to evaluate for DVT. However, he is on [facility] and has had a drop in his hgb. Feel that he should be admitted for pain control and possible serial hgb.  [RP]    Clinical Course User Index [RP] Fransico Meadow,  MD                             Medical Decision Making Problems Addressed: Anemia, unspecified type: acute illness or injury with systemic symptoms that poses a threat to life or bodily functions Hematoma: acute illness or injury with systemic symptoms S/P total right hip arthroplasty: acute illness or injury Swelling of thigh: acute illness or injury with systemic symptoms that poses a threat to life or bodily functions  Amount and/or Complexity of Data Reviewed Independent Historian:     Details: Family, hx External Data Reviewed: notes. Labs: ordered. Decision-making details documented in ED Course. Radiology: ordered and independent interpretation performed. Decision-making details documented in ED Course.  Risk Prescription drug management. Parenteral controlled substances. Decision regarding hospitalization.   Iv ns. Continuous pulse ox and cardiac monitoring. Labs ordered/sent. Imaging ordered.   Differential diagnosis includes  hematoma, dvt, post surgical swelling, compartment syndrome, etc. Dispo decision including potential need for admission considered - will get labs and imaging and reassess.   Reviewed nursing notes and prior charts for additional history. External reports reviewed. Additional history from: fam.   Cardiac monitor: sinus rhythm, rate 80.  Labs reviewed/interpreted by me - hgb low.   CT reviewed/interpreted by me - large hematoma. (Pt with no fever, chills, erythema, etc)  Dilaudid iv. Ivf. Orthopedics consulted - discussed with Ambrose Finland, and concern for large hematoma, and also concern for possible compartment syndrome - will see in ED. Ortho also requests we ordered a  dvt study - ordered.   Pain is improved/controlled by earlier pain meds.   Ortho coming to see - dispo per ortho service.   1630 signed out to Dr Sharlett Iles that ortho eval and plan pending, and that either way pt likely would benefit from admission for pain control, recheck hgb,  etc.              Final Clinical Impression(s) / ED Diagnoses Final diagnoses:  Swelling of thigh  Hematoma  Anemia, unspecified type  S/P total right hip arthroplasty    Rx / DC Orders ED Discharge Orders     None         Lajean Saver, MD 09/03/22 1640

## 2022-09-04 ENCOUNTER — Encounter (HOSPITAL_COMMUNITY): Payer: Self-pay | Admitting: Orthopedic Surgery

## 2022-09-04 LAB — CBC
HCT: 21.8 % — ABNORMAL LOW (ref 39.0–52.0)
Hemoglobin: 7.2 g/dL — ABNORMAL LOW (ref 13.0–17.0)
MCH: 34.1 pg — ABNORMAL HIGH (ref 26.0–34.0)
MCHC: 33 g/dL (ref 30.0–36.0)
MCV: 103.3 fL — ABNORMAL HIGH (ref 80.0–100.0)
Platelets: 273 10*3/uL (ref 150–400)
RBC: 2.11 MIL/uL — ABNORMAL LOW (ref 4.22–5.81)
RDW: 13.9 % (ref 11.5–15.5)
WBC: 11.4 10*3/uL — ABNORMAL HIGH (ref 4.0–10.5)
nRBC: 0 % (ref 0.0–0.2)

## 2022-09-04 LAB — PROTIME-INR
INR: 1.6 — ABNORMAL HIGH (ref 0.8–1.2)
Prothrombin Time: 18.6 seconds — ABNORMAL HIGH (ref 11.4–15.2)

## 2022-09-04 LAB — CK: Total CK: 348 U/L (ref 49–397)

## 2022-09-04 MED ORDER — SPIRONOLACTONE 25 MG PO TABS
25.0000 mg | ORAL_TABLET | Freq: Every day | ORAL | Status: DC
  Administered 2022-09-04 – 2022-09-09 (×4): 25 mg via ORAL
  Filled 2022-09-04 (×7): qty 1

## 2022-09-04 MED ORDER — SODIUM CHLORIDE 0.9 % IV SOLN: INTRAVENOUS | Status: DC

## 2022-09-04 MED ORDER — HYDRALAZINE HCL 25 MG PO TABS: 25.0000 mg | ORAL_TABLET | Freq: Three times a day (TID) | ORAL | Status: DC | PRN

## 2022-09-04 MED ORDER — HYDROCHLOROTHIAZIDE 25 MG PO TABS
25.0000 mg | ORAL_TABLET | Freq: Every morning | ORAL | Status: DC
  Administered 2022-09-06 – 2022-09-09 (×3): 25 mg via ORAL
  Filled 2022-09-04 (×7): qty 1

## 2022-09-04 MED ORDER — VITAMIN D 25 MCG (1000 UNIT) PO TABS
1000.0000 [IU] | ORAL_TABLET | Freq: Every day | ORAL | Status: DC
  Administered 2022-09-04 – 2022-09-10 (×6): 1000 [IU] via ORAL
  Filled 2022-09-04 (×8): qty 1

## 2022-09-04 MED ORDER — FLECAINIDE ACETATE 50 MG PO TABS
50.0000 mg | ORAL_TABLET | Freq: Two times a day (BID) | ORAL | Status: DC
  Administered 2022-09-04 – 2022-09-10 (×14): 50 mg via ORAL
  Filled 2022-09-04 (×17): qty 1

## 2022-09-04 MED ORDER — CLONAZEPAM 0.5 MG PO TABS: 0.5000 mg | ORAL_TABLET | Freq: Two times a day (BID) | ORAL | Status: DC | PRN

## 2022-09-04 MED ORDER — ROSUVASTATIN CALCIUM 20 MG PO TABS
20.0000 mg | ORAL_TABLET | Freq: Every day | ORAL | Status: DC
  Administered 2022-09-04 – 2022-09-09 (×7): 20 mg via ORAL
  Filled 2022-09-04 (×7): qty 1

## 2022-09-04 MED ORDER — METFORMIN HCL 500 MG PO TABS
500.0000 mg | ORAL_TABLET | Freq: Every day | ORAL | Status: DC
  Administered 2022-09-06 – 2022-09-10 (×5): 500 mg via ORAL
  Filled 2022-09-04 (×5): qty 1

## 2022-09-04 MED ORDER — LEVOTHYROXINE SODIUM 88 MCG PO TABS
88.0000 ug | ORAL_TABLET | Freq: Every day | ORAL | Status: DC
  Administered 2022-09-04 – 2022-09-10 (×7): 88 ug via ORAL
  Filled 2022-09-04 (×7): qty 1

## 2022-09-04 MED ORDER — PHYTONADIONE 5 MG PO TABS
2.5000 mg | ORAL_TABLET | Freq: Once | ORAL | Status: AC
  Administered 2022-09-04: 2.5 mg via ORAL
  Filled 2022-09-04 (×2): qty 1

## 2022-09-04 MED ORDER — ADULT MULTIVITAMIN W/MINERALS CH
1.0000 | ORAL_TABLET | Freq: Every day | ORAL | Status: DC
  Administered 2022-09-04 – 2022-09-10 (×6): 1 via ORAL
  Filled 2022-09-04 (×8): qty 1

## 2022-09-04 MED ORDER — VITAMIN B-6 100 MG PO TABS
100.0000 mg | ORAL_TABLET | Freq: Every day | ORAL | Status: DC
  Administered 2022-09-04 – 2022-09-10 (×5): 100 mg via ORAL
  Filled 2022-09-04 (×7): qty 1

## 2022-09-04 MED ORDER — METOPROLOL SUCCINATE ER 25 MG PO TB24
25.0000 mg | ORAL_TABLET | Freq: Every day | ORAL | Status: DC
  Administered 2022-09-06 – 2022-09-09 (×3): 25 mg via ORAL
  Filled 2022-09-04 (×7): qty 1

## 2022-09-04 NOTE — Consult Note (Signed)
Chief Complaint: Patient was seen in consultation today for RLE hematoma  Referring Physician(s): Dr. Weber Cooks  Supervising Physician: Simonne Come  Patient Status: [facility] - In-pt  History of Present Illness: Norman Hudson. is a 72 y.o. male with past medical history of arthritis, HTN, and a fib on Eliquis at home who is s/p THA revision 08/23/22 and now presents with RLE bruising and swelling.  CT imaging performed overnight shows: 8.4 x 7.0 cm intramuscular hematoma at the junction of the right gluteus medius and gluteus maximus musculature. Associated 7 mm focus of acute contrast extravasation/blush along the anterior/superior aspect of this hematoma. No associated dominant vessel leading to this area on CT.   Additional 8.0 x 3.6 cm low-density hematoma along the anterolateral aspect of the right thigh musculature, without associated contrast extravasation.  IR was consulted for possible angiogram with intervention in the setting of acute bleeding.  Case reviewed by Dr. Archer Asa who notes imaging is suggestive, but not confirmatory of an active bleed and patient is on anticoagulation which has not been reversed.   PA to bedside to discuss with patient.  He reports progressive swelling and tenderness over the past few days in the right leg.  He was not be able to ambulate as usual due to the tenderness.  He does feel there has been a slight improvement today compared to yesterday.  His vitals are currently stable.  Hgb was down to 7.2 from 9.8 last week when he was admitted for cardioversion due to resistant a fib.  INR remains elevated this AM at 1.6.     Past Medical History:  Diagnosis Date   Acne    Anxiety    takes Xanax daily as needed   Arthritis    Atrial flutter, paroxysmal (HCC)    History of kidney stones    Hyperlipidemia    takes Pravastatin daily   Hypertension    takes Amlodipine and Valsartan-HCTZ daily   Hypothyroidism     takes Synthroid daily   Inguinal hernia    left side   Internal hemorrhoids    Joint pain    Kidney stones    Pre-diabetes    Rosacea    TIA (transient ischemic attack) 06/2019    Past Surgical History:  Procedure Laterality Date   CARDIOVERSION N/A 08/28/2022   Procedure: CARDIOVERSION;  Surgeon: Yates Decamp, MD;  Location: [facility];  Service: Cardiovascular;  Laterality: N/A;   CARDIOVERSION N/A 08/29/2022   Procedure: CARDIOVERSION;  Surgeon: Yates Decamp, MD;  Location: [facility];  Service: Cardiovascular;  Laterality: N/A;   COLONOSCOPY     EXTRACORPOREAL SHOCK WAVE LITHOTRIPSY Right 12/27/2019   Procedure: EXTRACORPOREAL SHOCK WAVE LITHOTRIPSY (ESWL);  Surgeon: Malen Gauze, MD;  Location: [facility];  Service: Urology;  Laterality: Right;   LITHOTRIPSY  09/2015   LUMBAR LAMINECTOMY WITH COFLEX 1 LEVEL N/A 07/07/2015   Procedure: LUMBAR FOUR-FIVE LUMBAR LAMINECTOMY WITH COFLEX;  Surgeon: Tia Alert, MD;  Location: [facility];  Service: Neurosurgery;  Laterality: N/A;   TONSILLECTOMY     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 HEAD AND LINER  EXCHANGE. POSSIBLE CUP REVISION;  Surgeon: Joen Laura, MD;  Location: [facility];  Service: Orthopedics;  Laterality: Right;    Allergies: Bactrim [sulfamethoxazole-trimethoprim]  Medications: Prior to Admission medications   Medication Sig Start Date End Date Taking? Authorizing Provider  acetaminophen (TYLENOL) 325 MG tablet Take 650 mg by mouth every 6 (six) hours as needed for moderate pain.   Yes [provider]  cholecalciferol (VITAMIN  D3) 25 MCG (1000 UT) tablet Take 1,000 Units by mouth daily.   Yes [provider]  clobetasol ointment (TEMOVATE) 0.05 % Apply 1 Application topically 2 (two) times daily as needed (itching). 05/14/21  Yes [provider]  clonazePAM (KLONOPIN) 0.5 MG tablet Take 0.5-1 tablets (0.25-0.5 mg total) by mouth at bedtime as needed for anxiety. Patient taking differently: Take 0.5 mg by mouth at bedtime as needed for anxiety (sleep). 07/13/22  Yes McCue, Shanda Bumps, NP  ELIQUIS 5 MG TABS tablet TAKE ONE TABLET BY MOUTH TWICE DAILY Patient taking differently: Take 2.5 mg by mouth 2 (two) times daily. 08/23/22  Yes Tolia, Sunit, DO  flecainide (TAMBOCOR) 50 MG tablet Take 1 tablet (50 mg total) by mouth 2 (two) times daily. 08/29/22  Yes Yates Decamp, MD  hydrochlorothiazide (HYDRODIURIL) 25 MG tablet Take 1 tablet (25 mg total) by mouth every morning. 08/23/22 11/21/22 Yes Tolia, Sunit, DO  metFORMIN (GLUCOPHAGE) 500 MG tablet Take 500 mg by mouth daily with breakfast.   Yes [provider]  methocarbamol (ROBAXIN) 500 MG tablet Take 1 tablet (500 mg total) by mouth every 8 (eight) hours as needed for up to 10 days for muscle spasms. 08/27/22 09/06/22 Yes Marchwiany, Clarisse Gouge, MD  metoprolol succinate (TOPROL-XL) 25 MG 24 hr tablet Take 1 tablet (25 mg total) by mouth every morning. 08/23/22 11/21/22 Yes Tolia, Sunit, DO  Multiple Vitamins-Minerals (MULTIVITAMIN WITH MINERALS) tablet Take 1 tablet by mouth daily.   Yes [provider]  Multiple Vitamins-Minerals (PRESERVISION AREDS 2 PO) Take 1 capsule by mouth daily.   Yes [provider]  olmesartan (BENICAR) 40 MG tablet Take 1 tablet (40 mg total) by mouth daily at 10 pm. 08/23/22 11/21/22 Yes Tolia, Sunit, DO  oxyCODONE (ROXICODONE) 5 MG immediate release tablet Take 1-2 tablets (5-10 mg total) by mouth every 4 (four) hours as needed for up to 7 days for severe pain or moderate pain. Patient taking differently: Take 15 mg by  mouth every 4 (four) hours as needed for severe pain or moderate pain. 08/27/22 09/04/22 Yes Marchwiany, Clarisse Gouge, MD  pyridOXINE (VITAMIN B-6) 100 MG tablet Take 100 mg by mouth daily.   Yes [provider]  rosuvastatin (CRESTOR) 20 MG tablet TAKE ONE TABLET AT BEDTIME. Patient taking differently: Take 20 mg by mouth daily. 04/30/22  Yes Tolia, Sunit, DO  spironolactone (ALDACTONE) 25 MG tablet Take 1 tablet (25 mg total) by mouth daily. 05/20/22 05/15/23 Yes Tolia, Sunit, DO  SYNTHROID 88 MCG tablet Take 88 mcg by mouth daily before breakfast. 06/11/22  Yes [provider]  traMADol (ULTRAM) 50 MG tablet Take 100 mg by mouth daily. 08/05/22  Yes [provider]  valACYclovir (VALTREX) 1000 MG tablet Take 500 mg by mouth in the morning and at bedtime.   Yes [provider]  hydrALAZINE (APRESOLINE) 25 MG tablet Take 1 tablet (25 mg total) by mouth 3 (three) times daily as needed. Take as needed for blood pressure greater than 150/90 Patient not taking: Reported on 09/04/2022 07/08/22 11/05/22  Carmela Hurt,  [place], NP  ondansetron (ZOFRAN) 4 MG tablet Take 1 tablet (4 mg total) by mouth every 8 (eight) hours as needed for up to 14 days for nausea or vomiting. Patient not taking: Reported on 09/04/2022 08/27/22 09/10/22  Joen Laura, MD     Family History  Problem Relation Age of Onset   Hypertension Father    Sleep disorder Father    CVA Father    Cancer Maternal Grandfather    Hypertension Paternal Grandmother    Diabetes Paternal Grandmother    Alcoholism Mother    Hypertension Sister    Aneurysm Sister    Atrial fibrillation Sister    Other Neg Hx        hypogonadism   Sleep apnea Neg Hx     Social History   Socioeconomic History   Marital status: Married    Spouse name: Not on file   Number of children: 2   Years of education: Not on file   Highest education level: Bachelor's degree (e.g., BA, AB, BS)  Occupational History   Not on file   Tobacco Use   Smoking status: Former    Packs/day: 1.00    Years: 15.00    Total pack years: 15.00    Types: Cigarettes    Quit date: 02/12/1989    Years since quitting: 33.5   Smokeless tobacco: Never   Tobacco comments:    quit smoking in 1991  Vaping Use   Vaping Use: Never used  Substance and Sexual Activity   Alcohol use: No   Drug use: No   Sexual activity: Not on file  Other Topics Concern   Not on file  Social History Narrative   Lives at home with wife   4 cups of caffeine/day   Social Determinants of Health   Financial Resource Strain: Not on file  Food Insecurity: No Food Insecurity (09/03/2022)   Hunger Vital Sign    Worried About Running Out of Food in the Last Year: Never true    Ran Out of Food in the Last Year: Never true  Transportation Needs: No Transportation Needs (09/03/2022)   PRAPARE - Administrator, Civil Service (Medical): No    Lack of Transportation (Non-Medical): No  Physical Activity: Not on file  Stress: Not on file  Social Connections: Not on file     Review of Systems: A 12 point ROS discussed and pertinent positives are indicated in the HPI above.  All other systems are negative.  Review of Systems  Constitutional:  Negative for fatigue and fever.  Respiratory:  Negative for cough and shortness of breath.   Cardiovascular:  Negative for chest pain.  Gastrointestinal:  Negative for abdominal pain, diarrhea and nausea.  Skin:  Positive for color change (bruising, swelling RLE).  Psychiatric/Behavioral:  Negative for behavioral problems and confusion.     Vital Signs: BP 121/68 (BP Location: Right Arm)   Pulse 100   Temp 98 F (36.7 C) (Oral)   Resp 16   Ht 5\' 7"  (1.702 m)   Wt 210 lb (95.3 kg)   SpO2 96%   BMI 32.89 kg/m   Physical Exam Vitals and nursing note reviewed.  Constitutional:      General: He is not in acute distress.    Appearance: Normal appearance. He is not ill-appearing.  HENT:      Mouth/Throat:     Mouth: Mucous membranes are moist.     Pharynx: Oropharynx is clear.  Cardiovascular:  Rate and Rhythm: Normal rate and regular rhythm.  Pulmonary:     Effort: Pulmonary effort is normal.  Abdominal:     General: Abdomen is flat.     Palpations: Abdomen is soft.  Musculoskeletal:     Comments: RLE with swelling extending from the right shin to the thigh.  It is exquisitely tender.  Bruising noted throughout, especially in dependent portions.  Distal pulses intact, 2+.  Foot warm.  Although tender, thigh is compressible.   Skin:    General: Skin is warm and dry.  Neurological:     General: No focal deficit present.     Mental Status: He is alert.  Psychiatric:        Mood and Affect: Mood normal.        Behavior: Behavior normal.        Thought Content: Thought content normal.        Judgment: Judgment normal.      MD Evaluation Airway: WNL Heart: WNL Abdomen: WNL Chest/ Lungs: WNL ASA  Classification: 3 Mallampati/Airway Score: Two   Imaging: CT ANGIO LOWER EXT BILAT W &/OR WO CONTRAST  Result Date: 09/03/2022 CLINICAL DATA:  Right thigh hematoma status post hip replacement, evaluate for arterial bleed EXAM: CT ANGIOGRAPHY OF ABDOMINAL AORTA WITH ILIOFEMORAL RUNOFF TECHNIQUE: Multidetector CT imaging of the abdomen, pelvis and lower extremities was performed using the standard protocol during bolus administration of intravenous contrast. Multiplanar CT image reconstructions and MIPs were obtained to evaluate the vascular anatomy. RADIATION DOSE REDUCTION: This exam was performed according to the departmental dose-optimization program which includes automated exposure control, adjustment of the mA and/or kV according to patient size and/or use of iterative reconstruction technique. CONTRAST:  OMNIPAQUE IOHEXOL 350 MG/ML SOLN COMPARISON:  CT pelvis dated 09/03/2022 FINDINGS: VASCULAR Aorta: Patent. No evidence abdominal aortic aneurysm or dissection.  Atherosclerotic calcifications. Celiac: Patent.  Atherosclerotic calcifications at the origin. SMA: Patent.  Atherosclerotic calcifications at the origin. Renals: Patent bilaterally. Atherosclerotic calcifications at the origin. IMA: Patent. RIGHT Lower Extremity Inflow: Patent.  Atherosclerotic calcifications. Outflow: Patent.  Atherosclerotic calcifications. Additional comments: 8.4 x 7.0 cm mixed density intramuscular hematoma at the junction of the right gluteus medius and gluteus maximus musculature (series 8/image 123). Associated 7 mm focus of acute contrast extravasation/blush along the anterior/superior aspect of this hematoma (series 8/image 121). No associated dominant vessel leading to this area on CT. 8.0 x 3.6 cm low-density hematoma along the anterolateral aspect of the thigh musculature (series 8/image 211), without associated contrast extravasation. Runoff: Patent three-vessel runoff. LEFT Lower Extremity Inflow: Patent.  Atherosclerotic calcifications Outflow: Patent.  Atherosclerotic calcifications. Runoff: Patent three-vessel runoff. Veins: Grossly unremarkable. Review of the MIP images confirms the above findings. NON-VASCULAR Lower chest: Lung bases are clear. Hepatobiliary: Liver is within normal limits. Gallbladder is unremarkable. No hepatic or extrahepatic duct dilatation. Pancreas: Within normal limits. Spleen: Within normal limits. Adrenals/Urinary Tract: Adrenal glands are within normal limits. Multiple right renal cysts, measuring up to 2.1 cm in the medial right upper pole (series 8/image 33), benign (Bosniak I). No follow-up is recommended. Early excretory contrast in the bilateral renal collecting systems, obscuring evaluation for small renal calculi. No hydronephrosis. Bladder is notable for excretory contrast. Stomach/Bowel: Stomach is within normal limits. No evidence of bowel obstruction.  Moderate duodenal diverticulum. Normal appendix (series 8/image 105). No colonic wall  thickening or inflammatory changes. Lymphatic: No suspicious abdominopelvic lymphadenopathy. Reproductive: Prostate is obscured by streak artifact, better evaluated on recent CT pelvis. Other: Moderate  fat containing left inguinal hernia. Musculoskeletal: Bilateral hip arthroplasties. Postsurgical changes involving the lower lumbar spine. Right lower extremity intramuscular hematomas described above. IMPRESSION: 8.4 x 7.0 cm intramuscular hematoma at the junction of the right gluteus medius and gluteus maximus musculature. Associated 7 mm focus of acute contrast extravasation/blush along the anterior/superior aspect of this hematoma. No associated dominant vessel leading to this area on CT. Additional 8.0 x 3.6 cm low-density hematoma along the anterolateral aspect of the right thigh musculature, without associated contrast extravasation. Patent three-vessel runoff bilaterally. Electronically Signed   By: Charline Bills M.D.   On: 09/03/2022 19:43   VAS [place] LOWER EXTREMITY VENOUS (DVT) (7a-7p)  Result Date: 09/03/2022  Lower Venous DVT Study Patient Name:  Raidyn Bielinski.  Date of Exam:   09/03/2022 Medical Rec #: [id number]                    Accession #:    [id number] Date of Birth: February 06, 1951                    Patient Gender: M Patient Age:   60 years Exam Location:  [facility] Procedure:      VAS [place] LOWER EXTREMITY VENOUS (DVT) Referring Phys: Cathren Laine --------------------------------------------------------------------------------  Indications: Swelling, and Edema.  Comparison Study: no prior Performing Technologist: [place] Ponder RVS  Examination Guidelines: A complete evaluation includes B-mode imaging, spectral Doppler, color Doppler, and power Doppler as needed of all accessible portions of each vessel. Bilateral testing is considered an integral part of a complete examination. Limited examinations for reoccurring indications may be performed as noted. The reflux portion of  the exam is performed with the patient in reverse Trendelenburg.  +---------+---------------+---------+-----------+----------+--------------+ RIGHT    CompressibilityPhasicitySpontaneityPropertiesThrombus Aging +---------+---------------+---------+-----------+----------+--------------+ CFV      Full           Yes      Yes                                 +---------+---------------+---------+-----------+----------+--------------+ SFJ      Full                                                        +---------+---------------+---------+-----------+----------+--------------+ FV Prox  Full                                                        +---------+---------------+---------+-----------+----------+--------------+ FV Mid   Full                                                        +---------+---------------+---------+-----------+----------+--------------+ FV DistalFull           Yes      Yes                                 +---------+---------------+---------+-----------+----------+--------------+ PFV  Full                                                        +---------+---------------+---------+-----------+----------+--------------+ POP      Full                                                        +---------+---------------+---------+-----------+----------+--------------+ PTV      Full                                                        +---------+---------------+---------+-----------+----------+--------------+ PERO     Full           Yes      Yes                                 +---------+---------------+---------+-----------+----------+--------------+   +----+---------------+---------+-----------+----------+--------------+ LEFTCompressibilityPhasicitySpontaneityPropertiesThrombus Aging +----+---------------+---------+-----------+----------+--------------+ CFV Full           Yes      Yes                                  +----+---------------+---------+-----------+----------+--------------+     Summary: RIGHT: - There is no evidence of deep vein thrombosis in the lower extremity.  - No cystic structure found in the popliteal fossa.  LEFT: - No evidence of common femoral vein obstruction.  *See table(s) above for measurements and observations.    Preliminary    CT PELVIS W CONTRAST  Result Date: 09/03/2022 CLINICAL DATA:  Recent right hip arthroplasty revision 08/23/2022. Pain and swelling in right upper thigh extending to the knee. Some mottling and coloration as well. EXAM: CT PELVIS WITH CONTRAST TECHNIQUE: Multidetector CT imaging of the pelvis was performed using the standard protocol following the bolus administration of intravenous contrast. RADIATION DOSE REDUCTION: This exam was performed according to the departmental dose-optimization program which includes automated exposure control, adjustment of the mA and/or kV according to patient size and/or use of iterative reconstruction technique. CONTRAST:  75mL OMNIPAQUE IOHEXOL 350 MG/ML SOLN COMPARISON:  CT right hip 08/16/2022, AP right hip 04/24/2021 FINDINGS: Pelvis: Urinary Tract:  No abnormality visualized. Bowel:  Mild sigmoid diverticulosis. Vascular/Lymphatic: Moderate atherosclerotic calcifications. Reproductive: The prostate measures up to 6.4 cm in transverse dimension, moderately enlarged Other: There is a moderate-to-large left inguinal fat containing hernia measuring up to 3.8 x 5.2 cm (transverse by AP). 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 There is now improvement in the asymmetry of the right acetabular lining, with the right femoral head more appropriately located within the mid aspect of the right acetabular cup following interval revision from the prior 08/16/2022 CT. Within the limitations of metallic streak artifact, no perihardware lucency is seen to indicate hardware failure or loosening. There is  a normal nutrient foramen extending through the posterior femoral diaphyseal cortex just distal to the femoral stem hardware (  axial series 3, images 139 through 155 of 339). The superior portion of this was imaged on 08/16/2022 CT and is unchanged. Mild bilateral sacroiliac subchondral sclerosis and joint space narrowing. Minimal pubic symphysis joint space narrowing. -- Right femur: Mild-to-moderate medial and lateral component of the knee joint space narrowing with mild peripheral degenerative osteophytes. Minimal peripheral patellar degenerative osteophytes. On 08/16/2022 CT there was mild fluid abutting the posterolateral aspect of the right greater trochanter, measuring up to approximately 3.5 cm in transverse dimension and 6 cm in craniocaudal dimension. Following the interval surgical revision, there is a similar amount of mildly dense fluid in this region on the current CT, however there appears to be interval increase in mild fluid also extending more inferiorly, along the lateral border of the proximal vastus lateralis, likely in between the lateral fascial plane and lateral muscle body of the vastus lateralis (axial series 3, images 49 through 70). There is a small focus of air within this fluid (axial series 3, image 56). This fluid is contiguous with mildly dense fluid that extends along the more anterolateral quadriceps musculature, at the anterolateral aspect of the rectus femoris muscle (axial series 3, images 66 through 109), and also extends more distally to just above the level of the patella. Overall, there is mildly dense fluid may contain blood products measures up to approximately 9.5 x 3.7 x 29 cm in greatest transverse by AP by craniocaudal dimensions. Within the more superior aspect of the right hip, at the level of the junction of the right gluteus maximus and right gluteus medius muscles (axial series 3 images 28 through 42) there is increased density suggesting an intramuscular hematoma  measuring up to approximately 9 x 7 x 7 cm (transverse by AP by craniocaudal). IMPRESSION: Compared to 08/16/2022 CT: 1. 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 Within the limitations of metallic streak artifact, no perihardware lucency is seen to indicate hardware failure or loosening. 2. Interval increase in mildly dense fluid just lateral to the right greater trochanter which now extends distally along the lateral aspect of the right thigh, likely a postoperative seroma versus intramuscular hematoma along the anterolateral aspects of the right rectus femoris and vastus lateralis muscle bodies. There is a tiny focus of air within this fluid at the level of the proximal femoral diaphysis which may be postsurgical, however cannot entirely exclude a gas-forming infectious organism. Overall, this right thigh fluid measures up to approximately 9.5 x 3.7 x 29 cm in greatest transverse by AP by craniocaudal dimensions. Electronically Signed   By: Neita Garnet M.D.   On: 09/03/2022 15:30   CT FEMUR RIGHT W CONTRAST  Result Date: 09/03/2022 CLINICAL DATA:  Recent right hip arthroplasty revision 08/23/2022. Pain and swelling in right upper thigh extending to the knee. Some mottling and coloration as well. EXAM: CT PELVIS WITH CONTRAST TECHNIQUE: Multidetector CT imaging of the pelvis was performed using the standard protocol following the bolus administration of intravenous contrast. RADIATION DOSE REDUCTION: This exam was performed according to the departmental dose-optimization program which includes automated exposure control, adjustment of the mA and/or kV according to patient size and/or use of iterative reconstruction technique. CONTRAST:  75mL OMNIPAQUE IOHEXOL 350 MG/ML SOLN COMPARISON:  CT right hip 08/16/2022, AP right hip 04/24/2021 FINDINGS: Pelvis: Urinary Tract:  No abnormality visualized. Bowel:  Mild sigmoid diverticulosis. Vascular/Lymphatic: Moderate atherosclerotic  calcifications. Reproductive: The prostate measures up to 6.4 cm in transverse dimension, moderately enlarged Other: There is a moderate-to-large  left inguinal fat containing hernia measuring up to 3.8 x 5.2 cm (transverse by AP). 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 There is now improvement in the asymmetry of the right acetabular lining, with the right femoral head more appropriately located within the mid aspect of the right acetabular cup following interval revision from the prior 08/16/2022 CT. Within the limitations of metallic streak artifact, no perihardware lucency is seen to indicate hardware failure or loosening. There is a normal nutrient foramen extending through the posterior femoral diaphyseal cortex just distal to the femoral stem hardware (axial series 3, images 139 through 155 of 339). The superior portion of this was imaged on 08/16/2022 CT and is unchanged. Mild bilateral sacroiliac subchondral sclerosis and joint space narrowing. Minimal pubic symphysis joint space narrowing. -- Right femur: Mild-to-moderate medial and lateral component of the knee joint space narrowing with mild peripheral degenerative osteophytes. Minimal peripheral patellar degenerative osteophytes. On 08/16/2022 CT there was mild fluid abutting the posterolateral aspect of the right greater trochanter, measuring up to approximately 3.5 cm in transverse dimension and 6 cm in craniocaudal dimension. Following the interval surgical revision, there is a similar amount of mildly dense fluid in this region on the current CT, however there appears to be interval increase in mild fluid also extending more inferiorly, along the lateral border of the proximal vastus lateralis, likely in between the lateral fascial plane and lateral muscle body of the vastus lateralis (axial series 3, images 49 through 70). There is a small focus of air within this fluid (axial series 3, image 56).  This fluid is contiguous with mildly dense fluid that extends along the more anterolateral quadriceps musculature, at the anterolateral aspect of the rectus femoris muscle (axial series 3, images 66 through 109), and also extends more distally to just above the level of the patella. Overall, there is mildly dense fluid may contain blood products measures up to approximately 9.5 x 3.7 x 29 cm in greatest transverse by AP by craniocaudal dimensions. Within the more superior aspect of the right hip, at the level of the junction of the right gluteus maximus and right gluteus medius muscles (axial series 3 images 28 through 42) there is increased density suggesting an intramuscular hematoma measuring up to approximately 9 x 7 x 7 cm (transverse by AP by craniocaudal). IMPRESSION: Compared to 08/16/2022 CT: 1. 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 Within the limitations of metallic streak artifact, no perihardware lucency is seen to indicate hardware failure or loosening. 2. Interval increase in mildly dense fluid just lateral to the right greater trochanter which now extends distally along the lateral aspect of the right thigh, likely a postoperative seroma versus intramuscular hematoma along the anterolateral aspects of the right rectus femoris and vastus lateralis muscle bodies. There is a tiny focus of air within this fluid at the level of the proximal femoral diaphysis which may be postsurgical, however cannot entirely exclude a gas-forming infectious organism. Overall, this right thigh fluid measures up to approximately 9.5 x 3.7 x 29 cm in greatest transverse by AP by craniocaudal dimensions. Electronically Signed   By: Neita Garnet M.D.   On: 09/03/2022 15:30   DG HIP UNILAT W OR W/O PELVIS 2-3 VIEWS RIGHT  Result Date: 08/23/2022 CLINICAL DATA:  Status post right hip replacement EXAM: DG HIP (WITH OR WITHOUT PELVIS) 2-3V RIGHT COMPARISON:  None Available. FINDINGS: Patient  is status post right hip replacement with postoperative air  in the soft tissues. The acetabular and femoral component are in good position. IMPRESSION: Right hip replacement as above. Electronically Signed   By: Gerome Sam III M.D.   On: 08/23/2022 16:09   DG C-Arm 1-60 Min-No Report  Result Date: 08/23/2022 Fluoroscopy was utilized by the requesting physician.  No radiographic interpretation.   DG C-Arm 1-60 Min-No Report  Result Date: 08/23/2022 Fluoroscopy was utilized by the requesting physician.  No radiographic interpretation.   CT HIP RIGHT WO CONTRAST  Result Date: 08/16/2022 CLINICAL DATA:  Hardware dysfunction squeaking and grinding sensation associated with right total hip prosthesis. EXAM: CT OF THE RIGHT HIP WITHOUT CONTRAST TECHNIQUE: Multidetector CT imaging of the right hip was performed according to the standard protocol. Multiplanar CT image reconstructions were also generated. RADIATION DOSE REDUCTION: This exam was performed according to the departmental dose-optimization program which includes automated exposure control, adjustment of the mA and/or kV according to patient size and/or use of iterative reconstruction technique. COMPARISON:  Radiographs 04/24/2021 FINDINGS: Bones/Joint/Cartilage Full-thickness craniocaudad loss of the polyethylene liner of the acetabular shell component of the hip prosthesis, with the metal of the femoral component abutting the metal of the shell component as shown on image 53 series 6. The appearance suggests advanced polymer wear. There is some degenerative subcortical cyst formation along the superior acetabulum, but no large/substantial lytic lesion to suggest particulate disease involving the bony structures. No substantial abnormal lucency or complicating feature involving the stem component of the femoral prosthesis. Spinous process fixator partially imaged at the L4-5 level. The borderline bilateral foraminal stenosis at L5-S1 due to facet  spurring. There is fluid density along the lateral margin of the greater trochanter, query trochanteric bursitis. Ligaments Suboptimally assessed by CT. Muscles and Tendons Unremarkable Soft tissues Atherosclerosis is present, including aortoiliac atherosclerotic disease. Scattered sigmoid colon diverticula. IMPRESSION: 1. Full-thickness craniocaudad loss/wear of the polyethylene liner of the acetabular shell component of the right hip prosthesis. 2. Fluid density along the lateral margin of the greater trochanter, query trochanteric bursitis. 3. Borderline bilateral foraminal stenosis at L5-S1 due to facet spurring. 4. Spinous process fixator partially imaged at the L4-5 level. 5. Scattered sigmoid colon diverticula. 6. Atherosclerosis. 7. There is some degenerative subcortical cyst formation along the superior acetabulum, but no large/substantial lytic lesion to suggest particulate disease involving the bony structures. Aortic Atherosclerosis (ICD10-I70.0). Electronically Signed   By: Gaylyn Rong M.D.   On: 08/16/2022 15:03   PCV ECHOCARDIOGRAM COMPLETE  Result Date: 08/12/2022 Echocardiogram 08/12/2022: Normal LV systolic function with visual EF 60-65%. Left ventricle cavity is normal in size. Normal left ventricular wall thickness. Normal global wall motion. Normal diastolic filling pattern, normal LAP. Aortic valve sclerosis without stenosis. Mild tricuspid regurgitation. No evidence of pulmonary hypertension. Compared to 04/29/2021 otherwise no significant change.    Labs:  CBC: Recent Labs    08/26/22 0439 08/26/22 2046 08/27/22 0437 08/28/22 0518 08/30/22 1103 09/03/22 1146 09/03/22 1301 09/04/22 1011  WBC 8.1  --  6.8  --   --  17.6*  --  11.4*  HGB 6.7*   < > 8.7*   < > 9.8* 7.9* 8.2* 7.2*  HCT 19.5*   < > 25.1*   < > 29.4* 23.6* 24.0* 21.8*  PLT 105*  --  126*  --   --  292  --  273   < > = values in this interval not displayed.    COAGS: Recent Labs    09/04/22 1011   INR 1.6*  BMP: Recent Labs    08/13/22 1332 08/24/22 0431 08/28/22 0844 09/03/22 1146 09/03/22 1301  NA 140 133* 137 133* 136  K 4.6 4.0 3.5 3.8 3.8  CL 106 101 102 100 100  CO2 25 22 26 23   --   GLUCOSE 152* 179* 161* 171* 160*  BUN 24* 25* 19 41* 40*  CALCIUM 9.5 8.2* 8.5* 8.9  --   CREATININE 1.17 1.06 0.75 1.38* 1.30*  GFRNONAA >60 >60 >60 54*  --     LIVER FUNCTION TESTS: Recent Labs    08/13/22 1332 09/03/22 1146  BILITOT 0.6 1.7*  AST 27 39  ALT 23 27  ALKPHOS 42 37*  PROT 7.3 6.5  ALBUMIN 4.9 3.3*    TUMOR MARKERS: No results for input(s): "AFPTM", "CEA", "CA199", "CHROMGRNA" in the last 8760 hours.  Assessment and Plan: RLE hematoma Patient s/p R THA revision 08/23/22, on Eliquis at home for a fib presents to [facility] ED with RLE bruising and swelling.  CT imaging shows concern for active extravasation with associated hematomas.  IR consulted for possible angiogram with embolization.   Case reviewed by Dr. Archer Asa and Dr. Grace Isaac.  Patient assessed this AM.  He does have a downward trending Hgb, but reports he feels his right leg is stable, possibly slightly improved.  His last dose of Eliquis was yesterday at 11am and his INR remains elevated.   He did eat breakfast this AM and is not currently NPO.  Discussed with Dr. Grace Isaac.  Given NPO status, the stability in his vital signs, and physical exam recommend continue to wait washout of home anticoagulation and proceed with angiogram if he worsens clinically.   IR remains available.   Thank you for this interesting consult.  I greatly enjoyed meeting [facility] [employer]. and look forward to participating in their care.  A copy of this report was sent to the requesting provider on this date.  Electronically Signed: Hoyt Koch, PA 09/04/2022, 11:18 AM   I spent a total of 40 Minutes    in face to face in clinical consultation, greater than 50% of which was counseling/coordinating care  for RLE hematoma.

## 2022-09-04 NOTE — Progress Notes (Signed)
     Subjective: Patient reports that he is more comfortable today.  Swelling is somewhat improved.  Eager to proceed with intervention with IR this morning.  Eliquis is being held.  Objective:   VITALS:   Vitals:   09/03/22 2133 09/03/22 2144 09/04/22 0507 09/04/22 0816  BP: 115/61  96/61   Pulse: 94  73   Resp: 17  15   Temp: 98 F (36.7 C)  98 F (36.7 C) 98 F (36.7 C)  TempSrc: Oral  Oral Oral  SpO2: 100%  97%   Weight:  95.3 kg    Height:  '5\' 7"'$  (1.702 m)      RLE moderate right thigh swelling  Diffusely tender about the thigh in the  Sens DPN, SPN, TN intact  Motor EHL, ext, flex 5/5  DP 2+, PT 2+, No significant edema   Lab Results  Component Value Date   WBC 17.6 (H) 09/03/2022   HGB 8.2 (L) 09/03/2022   HCT 24.0 (L) 09/03/2022   MCV 102.2 (H) 09/03/2022   PLT 292 09/03/2022   BMET    Component Value Date/Time   NA 136 09/03/2022 1301   NA 140 09/04/2021 1448   K 3.8 09/03/2022 1301   CL 100 09/03/2022 1301   CO2 23 09/03/2022 1146   GLUCOSE 160 (H) 09/03/2022 1301   BUN 40 (H) 09/03/2022 1301   BUN 17 09/04/2021 1448   CREATININE 1.30 (H) 09/03/2022 1301   CALCIUM 8.9 09/03/2022 1146   EGFR 89 09/04/2021 1448   GFRNONAA 54 (L) 09/03/2022 1146   Assessment/Plan:     Principal Problem:   Hematoma of right hip  Status post right hip revision 08/23/2022 with significant postop swelling concerning for hematoma.  CT and CT angiogram performed of the right lower extremity yesterday concern for possible small arterial bleeding likely exacerbated by him being on Eliquis.  Discussed with interventional radiology will plan for intervention today, embolization, aspiration of the hematoma and drain placement. Patient understanding with the plan. Hopeful to avoid further surgical intervention if we get his swelling controlled with the help of interventional radiology. Holding Eliquis for now.  Currently with normal heart rate in sinus rhythm.   Correna Meacham A  Ethanjames Fontenot 09/04/2022, 8:33 AM   [name], MD  Contact information:   [phone] 7am-5pm epic message Dr. Zachery Dakins, or call office for patient follow up: [phone] After hours and holidays please check [email or web address] for group call information for [facility]

## 2022-09-05 ENCOUNTER — Encounter (HOSPITAL_COMMUNITY): Payer: Self-pay | Admitting: Orthopedic Surgery

## 2022-09-05 LAB — CBC
HCT: 21.3 % — ABNORMAL LOW (ref 39.0–52.0)
Hemoglobin: 6.9 g/dL — CL (ref 13.0–17.0)
MCH: 34.2 pg — ABNORMAL HIGH (ref 26.0–34.0)
MCHC: 32.4 g/dL (ref 30.0–36.0)
MCV: 105.4 fL — ABNORMAL HIGH (ref 80.0–100.0)
Platelets: 288 10*3/uL (ref 150–400)
RBC: 2.02 MIL/uL — ABNORMAL LOW (ref 4.22–5.81)
RDW: 13.7 % (ref 11.5–15.5)
WBC: 9.4 10*3/uL (ref 4.0–10.5)
nRBC: 0 % (ref 0.0–0.2)

## 2022-09-05 LAB — PROTIME-INR
INR: 1.4 — ABNORMAL HIGH (ref 0.8–1.2)
Prothrombin Time: 17.2 seconds — ABNORMAL HIGH (ref 11.4–15.2)

## 2022-09-05 LAB — PREPARE RBC (CROSSMATCH)

## 2022-09-05 MED ORDER — SODIUM CHLORIDE 0.9% IV SOLUTION: Freq: Once | INTRAVENOUS | Status: AC

## 2022-09-05 NOTE — Progress Notes (Signed)
Referring Physician(s): Dr. Weber Cooks  Supervising Physician: Ruel Favors  Patient Status:  [facility] - In-pt  Chief Complaint: RLE hematoma  Subjective: Feels things are improving slowly. Bruising and swelling are stable compared to yesterday.   Allergies: Bactrim [sulfamethoxazole-trimethoprim]  Medications: Prior to Admission medications   Medication Sig Start Date End Date Taking? Authorizing Provider  acetaminophen (TYLENOL) 325 MG tablet Take 650 mg by mouth every 6 (six) hours as needed for moderate pain.   Yes [provider]  cholecalciferol (VITAMIN D3) 25 MCG (1000 UT) tablet Take 1,000 Units by mouth daily.   Yes [provider]  clobetasol ointment (TEMOVATE) 0.05 % Apply 1 Application topically 2 (two) times daily as needed (itching). 05/14/21  Yes [provider]  clonazePAM (KLONOPIN) 0.5 MG tablet Take 0.5-1 tablets (0.25-0.5 mg total) by mouth at bedtime as needed for anxiety. Patient taking differently: Take 0.5 mg by mouth at bedtime as needed for anxiety (sleep). 07/13/22  Yes McCue, Shanda Bumps, NP  ELIQUIS 5 MG TABS tablet TAKE ONE TABLET BY MOUTH TWICE DAILY Patient taking differently: Take 2.5 mg by mouth 2 (two) times daily. 08/23/22  Yes Tolia, Sunit, DO  flecainide (TAMBOCOR) 50 MG tablet Take 1 tablet (50 mg total) by mouth 2 (two) times daily. 08/29/22  Yes Yates Decamp, MD  hydrochlorothiazide (HYDRODIURIL) 25 MG tablet Take 1 tablet (25 mg total) by mouth every morning. 08/23/22 11/21/22 Yes Tolia, Sunit, DO  metFORMIN (GLUCOPHAGE) 500 MG tablet Take 500 mg by mouth daily with breakfast.   Yes [provider]  methocarbamol (ROBAXIN) 500 MG tablet Take 1 tablet (500 mg total) by mouth every 8 (eight) hours as needed for up to 10 days for muscle spasms. 08/27/22 09/06/22 Yes Marchwiany, Clarisse Gouge, MD  metoprolol succinate (TOPROL-XL) 25 MG 24 hr tablet Take 1 tablet (25 mg total) by mouth every morning. 08/23/22 11/21/22 Yes  Tolia, Sunit, DO  Multiple Vitamins-Minerals (MULTIVITAMIN WITH MINERALS) tablet Take 1 tablet by mouth daily.   Yes [provider]  Multiple Vitamins-Minerals (PRESERVISION AREDS 2 PO) Take 1 capsule by mouth daily.   Yes [provider]  olmesartan (BENICAR) 40 MG tablet Take 1 tablet (40 mg total) by mouth daily at 10 pm. 08/23/22 11/21/22 Yes Tolia, Sunit, DO  pyridOXINE (VITAMIN B-6) 100 MG tablet Take 100 mg by mouth daily.   Yes [provider]  rosuvastatin (CRESTOR) 20 MG tablet TAKE ONE TABLET AT BEDTIME. Patient taking differently: Take 20 mg by mouth daily. 04/30/22  Yes Tolia, Sunit, DO  spironolactone (ALDACTONE) 25 MG tablet Take 1 tablet (25 mg total) by mouth daily. 05/20/22 05/15/23 Yes Tolia, Sunit, DO  SYNTHROID 88 MCG tablet Take 88 mcg by mouth daily before breakfast. 06/11/22  Yes [provider]  valACYclovir (VALTREX) 1000 MG tablet Take 1,000 mg by mouth daily as needed (Herpes flare).   Yes [provider]  hydrALAZINE (APRESOLINE) 25 MG tablet Take 1 tablet (25 mg total) by mouth 3 (three) times daily as needed. Take as needed for blood pressure greater than 150/90 Patient not taking: Reported on 09/04/2022 07/08/22 11/05/22  Nori Riis, NP  ondansetron (ZOFRAN) 4 MG tablet Take 1 tablet (4 mg total) by mouth every 8 (eight) hours as needed for up to 14 days for nausea or vomiting. Patient not taking: Reported on 09/04/2022 08/27/22 09/10/22  Joen Laura, MD     Vital Signs: BP 119/70   Pulse 76   Temp 98.4 F (36.9  C) (Oral)   Resp 15   Ht 5\' 7"  (1.702 m)   Wt 210 lb (95.3 kg)   SpO2 100%   BMI 32.89 kg/m   Physical Exam NAD, alert Heart: RRR Lungs: CTAB MSK:  R leg with stable swelling, no increased tightness.  Bruising less pronounced today.  No new areas or extension past the knee/proximal shin.   Imaging: VAS [place] LOWER EXTREMITY VENOUS (DVT) (7a-7p)  Result Date: 09/05/2022  Lower Venous DVT  Study Patient Name:  Norman Hudson.  Date of Exam:   09/03/2022 Medical Rec #: [id number]                    Accession #:    [id number] Date of Birth: [birth year]                    Patient Gender: M Patient Age:   73 years Exam Location:  [facility] Procedure:      VAS [place] LOWER EXTREMITY VENOUS (DVT) Referring Phys: Cathren Laine --------------------------------------------------------------------------------  Indications: Swelling, and Edema.  Comparison Study: no prior Performing Technologist: [place] Ponder RVS  Examination Guidelines: A complete evaluation includes B-mode imaging, spectral Doppler, color Doppler, and power Doppler as needed of all accessible portions of each vessel. Bilateral testing is considered an integral part of a complete examination. Limited examinations for reoccurring indications may be performed as noted. The reflux portion of the exam is performed with the patient in reverse Trendelenburg.  +---------+---------------+---------+-----------+----------+--------------+ RIGHT    CompressibilityPhasicitySpontaneityPropertiesThrombus Aging +---------+---------------+---------+-----------+----------+--------------+ CFV      Full           Yes      Yes                                 +---------+---------------+---------+-----------+----------+--------------+ SFJ      Full                                                        +---------+---------------+---------+-----------+----------+--------------+ FV Prox  Full                                                        +---------+---------------+---------+-----------+----------+--------------+ FV Mid   Full                                                        +---------+---------------+---------+-----------+----------+--------------+ FV DistalFull           Yes      Yes                                 +---------+---------------+---------+-----------+----------+--------------+ PFV       Full                                                        +---------+---------------+---------+-----------+----------+--------------+  POP      Full                                                        +---------+---------------+---------+-----------+----------+--------------+ PTV      Full                                                        +---------+---------------+---------+-----------+----------+--------------+ PERO     Full           Yes      Yes                                 +---------+---------------+---------+-----------+----------+--------------+   +----+---------------+---------+-----------+----------+--------------+ LEFTCompressibilityPhasicitySpontaneityPropertiesThrombus Aging +----+---------------+---------+-----------+----------+--------------+ CFV Full           Yes      Yes                                 +----+---------------+---------+-----------+----------+--------------+     Summary: RIGHT: - There is no evidence of deep vein thrombosis in the lower extremity.  - No cystic structure found in the popliteal fossa.  LEFT: - No evidence of common femoral vein obstruction.  *See table(s) above for measurements and observations. Electronically signed by Lemar Livings MD on 09/05/2022 at 10:19:22 AM.    Final    CT ANGIO LOWER EXT BILAT W &/OR WO CONTRAST  Result Date: 09/03/2022 CLINICAL DATA:  Right thigh hematoma status post hip replacement, evaluate for arterial bleed EXAM: CT ANGIOGRAPHY OF ABDOMINAL AORTA WITH ILIOFEMORAL RUNOFF TECHNIQUE: Multidetector CT imaging of the abdomen, pelvis and lower extremities was performed using the standard protocol during bolus administration of intravenous contrast. Multiplanar CT image reconstructions and MIPs were obtained to evaluate the vascular anatomy. RADIATION DOSE REDUCTION: This exam was performed according to the departmental dose-optimization program which includes automated exposure control,  adjustment of the mA and/or kV according to patient size and/or use of iterative reconstruction technique. CONTRAST:  OMNIPAQUE IOHEXOL 350 MG/ML SOLN COMPARISON:  CT pelvis dated 09/03/2022 FINDINGS: VASCULAR Aorta: Patent. No evidence abdominal aortic aneurysm or dissection. Atherosclerotic calcifications. Celiac: Patent.  Atherosclerotic calcifications at the origin. SMA: Patent.  Atherosclerotic calcifications at the origin. Renals: Patent bilaterally. Atherosclerotic calcifications at the origin. IMA: Patent. RIGHT Lower Extremity Inflow: Patent.  Atherosclerotic calcifications. Outflow: Patent.  Atherosclerotic calcifications. Additional comments: 8.4 x 7.0 cm mixed density intramuscular hematoma at the junction of the right gluteus medius and gluteus maximus musculature (series 8/image 123). Associated 7 mm focus of acute contrast extravasation/blush along the anterior/superior aspect of this hematoma (series 8/image 121). No associated dominant vessel leading to this area on CT. 8.0 x 3.6 cm low-density hematoma along the anterolateral aspect of the thigh musculature (series 8/image 211), without associated contrast extravasation. Runoff: Patent three-vessel runoff. LEFT Lower Extremity Inflow: Patent.  Atherosclerotic calcifications Outflow: Patent.  Atherosclerotic calcifications. Runoff: Patent three-vessel runoff. Veins: Grossly unremarkable. Review of the MIP images confirms the above findings. NON-VASCULAR Lower chest: Lung bases are clear. Hepatobiliary: Liver is within normal limits. Gallbladder is  unremarkable. No hepatic or extrahepatic duct dilatation. Pancreas: Within normal limits. Spleen: Within normal limits. Adrenals/Urinary Tract: Adrenal glands are within normal limits. Multiple right renal cysts, measuring up to 2.1 cm in the medial right upper pole (series 8/image 33), benign (Bosniak I). No follow-up is recommended. Early excretory contrast in the bilateral renal collecting  systems, obscuring evaluation for small renal calculi. No hydronephrosis. Bladder is notable for excretory contrast. Stomach/Bowel: Stomach is within normal limits. No evidence of bowel obstruction.  Moderate duodenal diverticulum. Normal appendix (series 8/image 105). No colonic wall thickening or inflammatory changes. Lymphatic: No suspicious abdominopelvic lymphadenopathy. Reproductive: Prostate is obscured by streak artifact, better evaluated on recent CT pelvis. Other: Moderate fat containing left inguinal hernia. Musculoskeletal: Bilateral hip arthroplasties. Postsurgical changes involving the lower lumbar spine. Right lower extremity intramuscular hematomas described above. IMPRESSION: 8.4 x 7.0 cm intramuscular hematoma at the junction of the right gluteus medius and gluteus maximus musculature. Associated 7 mm focus of acute contrast extravasation/blush along the anterior/superior aspect of this hematoma. No associated dominant vessel leading to this area on CT. Additional 8.0 x 3.6 cm low-density hematoma along the anterolateral aspect of the right thigh musculature, without associated contrast extravasation. Patent three-vessel runoff bilaterally. Electronically Signed   By: Charline Bills M.D.   On: 09/03/2022 19:43   CT PELVIS W CONTRAST  Result Date: 09/03/2022 CLINICAL DATA:  Recent right hip arthroplasty revision 08/23/2022. Pain and swelling in right upper thigh extending to the knee. Some mottling and coloration as well. EXAM: CT PELVIS WITH CONTRAST TECHNIQUE: Multidetector CT imaging of the pelvis was performed using the standard protocol following the bolus administration of intravenous contrast. RADIATION DOSE REDUCTION: This exam was performed according to the departmental dose-optimization program which includes automated exposure control, adjustment of the mA and/or kV according to patient size and/or use of iterative reconstruction technique. CONTRAST:  75mL OMNIPAQUE IOHEXOL 350  MG/ML SOLN COMPARISON:  CT right hip 08/16/2022, AP right hip 04/24/2021 FINDINGS: Pelvis: Urinary Tract:  No abnormality visualized. Bowel:  Mild sigmoid diverticulosis. Vascular/Lymphatic: Moderate atherosclerotic calcifications. Reproductive: The prostate measures up to 6.4 cm in transverse dimension, moderately enlarged Other: There is a moderate-to-large left inguinal fat containing hernia measuring up to 3.8 x 5.2 cm (transverse by AP). 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 There is now improvement in the asymmetry of the right acetabular lining, with the right femoral head more appropriately located within the mid aspect of the right acetabular cup following interval revision from the prior 08/16/2022 CT. Within the limitations of metallic streak artifact, no perihardware lucency is seen to indicate hardware failure or loosening. There is a normal nutrient foramen extending through the posterior femoral diaphyseal cortex just distal to the femoral stem hardware (axial series 3, images 139 through 155 of 339). The superior portion of this was imaged on 08/16/2022 CT and is unchanged. Mild bilateral sacroiliac subchondral sclerosis and joint space narrowing. Minimal pubic symphysis joint space narrowing. -- Right femur: Mild-to-moderate medial and lateral component of the knee joint space narrowing with mild peripheral degenerative osteophytes. Minimal peripheral patellar degenerative osteophytes. On 08/16/2022 CT there was mild fluid abutting the posterolateral aspect of the right greater trochanter, measuring up to approximately 3.5 cm in transverse dimension and 6 cm in craniocaudal dimension. Following the interval surgical revision, there is a similar amount of mildly dense fluid in this region on the current CT, however there appears to be interval increase in mild fluid also extending more  inferiorly, along the lateral border of the proximal vastus  lateralis, likely in between the lateral fascial plane and lateral muscle body of the vastus lateralis (axial series 3, images 49 through 70). There is a small focus of air within this fluid (axial series 3, image 56). This fluid is contiguous with mildly dense fluid that extends along the more anterolateral quadriceps musculature, at the anterolateral aspect of the rectus femoris muscle (axial series 3, images 66 through 109), and also extends more distally to just above the level of the patella. Overall, there is mildly dense fluid may contain blood products measures up to approximately 9.5 x 3.7 x 29 cm in greatest transverse by AP by craniocaudal dimensions. Within the more superior aspect of the right hip, at the level of the junction of the right gluteus maximus and right gluteus medius muscles (axial series 3 images 28 through 42) there is increased density suggesting an intramuscular hematoma measuring up to approximately 9 x 7 x 7 cm (transverse by AP by craniocaudal). IMPRESSION: Compared to 08/16/2022 CT: 1. 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 Within the limitations of metallic streak artifact, no perihardware lucency is seen to indicate hardware failure or loosening. 2. Interval increase in mildly dense fluid just lateral to the right greater trochanter which now extends distally along the lateral aspect of the right thigh, likely a postoperative seroma versus intramuscular hematoma along the anterolateral aspects of the right rectus femoris and vastus lateralis muscle bodies. There is a tiny focus of air within this fluid at the level of the proximal femoral diaphysis which may be postsurgical, however cannot entirely exclude a gas-forming infectious organism. Overall, this right thigh fluid measures up to approximately 9.5 x 3.7 x 29 cm in greatest transverse by AP by craniocaudal dimensions. Electronically Signed   By: Neita Garnet M.D.   On: 09/03/2022 15:30    CT FEMUR RIGHT W CONTRAST  Result Date: 09/03/2022 CLINICAL DATA:  Recent right hip arthroplasty revision 08/23/2022. Pain and swelling in right upper thigh extending to the knee. Some mottling and coloration as well. EXAM: CT PELVIS WITH CONTRAST TECHNIQUE: Multidetector CT imaging of the pelvis was performed using the standard protocol following the bolus administration of intravenous contrast. RADIATION DOSE REDUCTION: This exam was performed according to the departmental dose-optimization program which includes automated exposure control, adjustment of the mA and/or kV according to patient size and/or use of iterative reconstruction technique. CONTRAST:  75mL OMNIPAQUE IOHEXOL 350 MG/ML SOLN COMPARISON:  CT right hip 08/16/2022, AP right hip 04/24/2021 FINDINGS: Pelvis: Urinary Tract:  No abnormality visualized. Bowel:  Mild sigmoid diverticulosis. Vascular/Lymphatic: Moderate atherosclerotic calcifications. Reproductive: The prostate measures up to 6.4 cm in transverse dimension, moderately enlarged Other: There is a moderate-to-large left inguinal fat containing hernia measuring up to 3.8 x 5.2 cm (transverse by AP). 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 There is now improvement in the asymmetry of the right acetabular lining, with the right femoral head more appropriately located within the mid aspect of the right acetabular cup following interval revision from the prior 08/16/2022 CT. Within the limitations of metallic streak artifact, no perihardware lucency is seen to indicate hardware failure or loosening. There is a normal nutrient foramen extending through the posterior femoral diaphyseal cortex just distal to the femoral stem hardware (axial series 3, images 139 through 155 of 339). The superior portion of this was imaged on 08/16/2022 CT and is unchanged. Mild bilateral sacroiliac  subchondral sclerosis and joint space narrowing. Minimal pubic  symphysis joint space narrowing. -- Right femur: Mild-to-moderate medial and lateral component of the knee joint space narrowing with mild peripheral degenerative osteophytes. Minimal peripheral patellar degenerative osteophytes. On 08/16/2022 CT there was mild fluid abutting the posterolateral aspect of the right greater trochanter, measuring up to approximately 3.5 cm in transverse dimension and 6 cm in craniocaudal dimension. Following the interval surgical revision, there is a similar amount of mildly dense fluid in this region on the current CT, however there appears to be interval increase in mild fluid also extending more inferiorly, along the lateral border of the proximal vastus lateralis, likely in between the lateral fascial plane and lateral muscle body of the vastus lateralis (axial series 3, images 49 through 70). There is a small focus of air within this fluid (axial series 3, image 56). This fluid is contiguous with mildly dense fluid that extends along the more anterolateral quadriceps musculature, at the anterolateral aspect of the rectus femoris muscle (axial series 3, images 66 through 109), and also extends more distally to just above the level of the patella. Overall, there is mildly dense fluid may contain blood products measures up to approximately 9.5 x 3.7 x 29 cm in greatest transverse by AP by craniocaudal dimensions. Within the more superior aspect of the right hip, at the level of the junction of the right gluteus maximus and right gluteus medius muscles (axial series 3 images 28 through 42) there is increased density suggesting an intramuscular hematoma measuring up to approximately 9 x 7 x 7 cm (transverse by AP by craniocaudal). IMPRESSION: Compared to 08/16/2022 CT: 1. 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 Within the limitations of metallic streak artifact, no perihardware lucency is seen to indicate hardware failure or loosening. 2. Interval  increase in mildly dense fluid just lateral to the right greater trochanter which now extends distally along the lateral aspect of the right thigh, likely a postoperative seroma versus intramuscular hematoma along the anterolateral aspects of the right rectus femoris and vastus lateralis muscle bodies. There is a tiny focus of air within this fluid at the level of the proximal femoral diaphysis which may be postsurgical, however cannot entirely exclude a gas-forming infectious organism. Overall, this right thigh fluid measures up to approximately 9.5 x 3.7 x 29 cm in greatest transverse by AP by craniocaudal dimensions. Electronically Signed   By: Neita Garnet M.D.   On: 09/03/2022 15:30    Labs:  CBC: Recent Labs    08/27/22 0437 08/28/22 0518 09/03/22 1146 09/03/22 1301 09/04/22 1011 09/05/22 0331  WBC 6.8  --  17.6*  --  11.4* 9.4  HGB 8.7*   < > 7.9* 8.2* 7.2* 6.9*  HCT 25.1*   < > 23.6* 24.0* 21.8* 21.3*  PLT 126*  --  292  --  273 288   < > = values in this interval not displayed.    COAGS: Recent Labs    09/04/22 1011 09/05/22 0331  INR 1.6* 1.4*    BMP: Recent Labs    08/13/22 1332 08/24/22 0431 08/28/22 0844 09/03/22 1146 09/03/22 1301  NA 140 133* 137 133* 136  K 4.6 4.0 3.5 3.8 3.8  CL 106 101 102 100 100  CO2 25 22 26 23   --   GLUCOSE 152* 179* 161* 171* 160*  BUN 24* 25* 19 41* 40*  CALCIUM 9.5 8.2* 8.5* 8.9  --   CREATININE 1.17 1.06 0.75  1.38* 1.30*  GFRNONAA >60 >60 >60 54*  --     LIVER FUNCTION TESTS: Recent Labs    08/13/22 1332 09/03/22 1146  BILITOT 0.6 1.7*  AST 27 39  ALT 23 27  ALKPHOS 42 37*  PROT 7.3 6.5  ALBUMIN 4.9 3.3*    Assessment and Plan: RLE hematoma s/p revision 08/23/22 Patient reporting modest improvement today.  No further evidence of ongoing bleeding.  Slight drop in Hgb which is hopefully approaching nadir.   For 1u PRBC today.  Case reviewed and discussed between Dr. Grace Isaac and Dr. Blanchie Dessert, plan made to proceed  with aspiration/drainage of hematoma tomorrow.  NPO p MN.  Limited role for angiography given his small drop in Hgb with reported improvement.   Risks and benefits discussed with the patient including bleeding, infection, damage to adjacent structures, bowel perforation/fistula connection, and sepsis.  All of the patient's questions were answered, patient is agreeable to proceed. Consent signed and in chart.  Electronically Signed: Hoyt Koch, PA 09/05/2022, 3:02 PM   I spent a total of 15 Minutes at the the patient's bedside AND on the patient's hospital floor or unit, greater than 50% of which was counseling/coordinating care for RLE hematoma.

## 2022-09-05 NOTE — Progress Notes (Signed)
     Subjective: Patient reports that he continues to note gradual improvement. swelling is somewhat improved.  Hemoglobin down to 6.9, he is agreeable with 1 unit transfusion today.  Eliquis is being held.  He is understandably concerned about his swelling and bleeding increasing as he mobilizes more and as we resume anticoagulation.  Objective:   VITALS:   Vitals:   09/04/22 2051 09/05/22 0554 09/05/22 0620 09/05/22 0857  BP: 109/64 (!) 100/59 (!) 100/59 131/64  Pulse: 82 76 76 82  Resp: '14 13 13 16  '$ Temp:   99 F (37.2 C) 98.3 F (36.8 C)  TempSrc:    Oral  SpO2: 100% 99%  100%  Weight:      Height:        RLE moderate right thigh swelling  Diffusely tender about the thigh in the  Sens DPN, SPN, TN intact  Motor EHL, ext, flex 5/5  DP 2+, PT 2+, No significant edema   Lab Results  Component Value Date   WBC 9.4 09/05/2022   HGB 6.9 (LL) 09/05/2022   HCT 21.3 (L) 09/05/2022   MCV 105.4 (H) 09/05/2022   PLT 288 09/05/2022   BMET    Component Value Date/Time   NA 136 09/03/2022 1301   NA 140 09/04/2021 1448   K 3.8 09/03/2022 1301   CL 100 09/03/2022 1301   CO2 23 09/03/2022 1146   GLUCOSE 160 (H) 09/03/2022 1301   BUN 40 (H) 09/03/2022 1301   BUN 17 09/04/2021 1448   CREATININE 1.30 (H) 09/03/2022 1301   CALCIUM 8.9 09/03/2022 1146   EGFR 89 09/04/2021 1448   GFRNONAA 54 (L) 09/03/2022 1146   Assessment/Plan:     Principal Problem:   Hematoma of right hip  Status post right hip revision 08/23/2022 with significant postop swelling concerning for hematoma.  CT and CT angiogram performed of the right lower extremity yesterday concern for possible small arterial bleeding likely exacerbated by him being on Eliquis.    Appreciate input from interventional radiology about possible intervention today.  Holding Eliquis for now.  Currently with normal heart rate in sinus rhythm.   Joycelynn Fritsche A Ryon Layton 09/05/2022, 9:04 AM   [name], MD  Contact  information:   [phone] 7am-5pm epic message Dr. Zachery Dakins, or call office for patient follow up: [phone] After hours and holidays please check [email or web address] for group call information for [facility]

## 2022-09-06 ENCOUNTER — Inpatient Hospital Stay (HOSPITAL_COMMUNITY): Payer: Medicare HMO

## 2022-09-06 HISTORY — PX: IR US GUIDE BX ASP/DRAIN: IMG2392

## 2022-09-06 LAB — PROTIME-INR
INR: 1.2 (ref 0.8–1.2)
Prothrombin Time: 15.4 seconds — ABNORMAL HIGH (ref 11.4–15.2)

## 2022-09-06 LAB — CBC
HCT: 22.4 % — ABNORMAL LOW (ref 39.0–52.0)
Hemoglobin: 7.6 g/dL — ABNORMAL LOW (ref 13.0–17.0)
MCH: 33.8 pg (ref 26.0–34.0)
MCHC: 33.9 g/dL (ref 30.0–36.0)
MCV: 99.6 fL (ref 80.0–100.0)
Platelets: 257 10*3/uL (ref 150–400)
RBC: 2.25 MIL/uL — ABNORMAL LOW (ref 4.22–5.81)
RDW: 16.1 % — ABNORMAL HIGH (ref 11.5–15.5)
WBC: 8.7 10*3/uL (ref 4.0–10.5)
nRBC: 0 % (ref 0.0–0.2)

## 2022-09-06 LAB — PREPARE RBC (CROSSMATCH)

## 2022-09-06 MED ORDER — FENTANYL CITRATE (PF) 100 MCG/2ML IJ SOLN
INTRAMUSCULAR | Status: AC | PRN
  Administered 2022-09-06: 50 ug via INTRAVENOUS

## 2022-09-06 MED ORDER — MIDAZOLAM HCL 2 MG/2ML IJ SOLN
INTRAMUSCULAR | Status: AC
  Filled 2022-09-06: qty 2

## 2022-09-06 MED ORDER — FENTANYL CITRATE (PF) 100 MCG/2ML IJ SOLN
INTRAMUSCULAR | Status: AC
  Filled 2022-09-06: qty 2

## 2022-09-06 MED ORDER — MIDAZOLAM HCL 2 MG/2ML IJ SOLN
INTRAMUSCULAR | Status: AC | PRN
  Administered 2022-09-06: 1 mg via INTRAVENOUS

## 2022-09-06 MED ORDER — SODIUM CHLORIDE 0.9% IV SOLUTION: Freq: Once | INTRAVENOUS | Status: DC

## 2022-09-06 NOTE — Progress Notes (Signed)
     Subjective: Patient continues to look slowly better. Swelling gradually decreasing. Norman Hudson held since Friday now.  Hgb up to 7.6 after 1u PRBCs yesterday. Plan to give 1 more unit today to keep hgb >8. Plan for IR evaluation today.   Objective:   VITALS:   Vitals:   09/05/22 1930 09/05/22 2000 09/05/22 2100 09/06/22 0549  BP: (!) 142/67 (!) 125/59 110/89 112/65  Pulse: 87 84 80 76  Resp: '16 16 13 16  '$ Temp: 97.8 F (36.6 C)   98.9 F (37.2 C)  TempSrc: Oral   Oral  SpO2: 100% 100% 97% 100%  Weight:      Height:        RLE moderate right thigh swelling  Diffusely tender about the thigh in the  Sens DPN, SPN, TN intact  Motor EHL, ext, flex 5/5  DP 2+, PT 2+, No significant edema   Lab Results  Component Value Date   WBC 8.7 09/06/2022   HGB 7.6 (L) 09/06/2022   HCT 22.4 (L) 09/06/2022   MCV 99.6 09/06/2022   PLT 257 09/06/2022   BMET    Component Value Date/Time   NA 136 09/03/2022 1301   NA 140 09/04/2021 1448   K 3.8 09/03/2022 1301   CL 100 09/03/2022 1301   CO2 23 09/03/2022 1146   GLUCOSE 160 (H) 09/03/2022 1301   BUN 40 (H) 09/03/2022 1301   BUN 17 09/04/2021 1448   CREATININE 1.30 (H) 09/03/2022 1301   CALCIUM 8.9 09/03/2022 1146   EGFR 89 09/04/2021 1448   GFRNONAA 54 (L) 09/03/2022 1146   Assessment/Plan:     Principal Problem:   Hematoma of right hip  Status post right hip revision 08/23/2022 with significant postop swelling concerning for hematoma.  CT and CT angiogram performed of the right lower extremity yesterday concern for possible small arterial bleeding likely exacerbated by him being on Norman Hudson.    Appreciate input from interventional radiology about possible intervention today.  Holding Norman Hudson for now.  Currently with normal heart rate in sinus rhythm.   Norman Hudson 09/06/2022, 6:53 AM   Norman Constable, MD  Contact information:   [phone] 7am-5pm epic message Dr. Zachery Hudson, or call office for patient  follow up: [phone] After hours and holidays please check [email or web address] for group call information for [facility]

## 2022-09-06 NOTE — Procedures (Signed)
Interventional Radiology Procedure Note  Procedure: [place] RT LATERAL THIGH HEMATOMA DRAIN PLACEMENT    Complications: None  Estimated Blood Loss:  MIN  Findings: 200CC OLD DARK BLOOD ASPIRATED C/W HEMATOMA NO PURULENT COMPONENT     Tamera Punt, MD

## 2022-09-07 LAB — TYPE AND SCREEN
ABO/RH(D): B POS
Antibody Screen: NEGATIVE
Unit division: 0
Unit division: 0

## 2022-09-07 LAB — BPAM RBC
Blood Product Expiration Date: 202402152359
Blood Product Expiration Date: 202402152359
ISSUE DATE / TIME: 202401280938
ISSUE DATE / TIME: 202401291125
Unit Type and Rh: 7300
Unit Type and Rh: 7300

## 2022-09-07 LAB — BASIC METABOLIC PANEL
Anion gap: 6 (ref 5–15)
BUN: 14 mg/dL (ref 8–23)
CO2: 25 mmol/L (ref 22–32)
Calcium: 8.7 mg/dL — ABNORMAL LOW (ref 8.9–10.3)
Chloride: 103 mmol/L (ref 98–111)
Creatinine, Ser: 1 mg/dL (ref 0.61–1.24)
GFR, Estimated: >60 mL/min (ref >60)
Glucose, Bld: 140 mg/dL — ABNORMAL HIGH (ref 70–99)
Potassium: 4.2 mmol/L (ref 3.5–5.1)
Sodium: 134 mmol/L — ABNORMAL LOW (ref 135–145)

## 2022-09-07 LAB — CBC
HCT: 28.5 % — ABNORMAL LOW (ref 39.0–52.0)
Hemoglobin: 9.8 g/dL — ABNORMAL LOW (ref 13.0–17.0)
MCH: 33.2 pg (ref 26.0–34.0)
MCHC: 34.4 g/dL (ref 30.0–36.0)
MCV: 96.6 fL (ref 80.0–100.0)
Platelets: 302 10*3/uL (ref 150–400)
RBC: 2.95 MIL/uL — ABNORMAL LOW (ref 4.22–5.81)
RDW: 16.9 % — ABNORMAL HIGH (ref 11.5–15.5)
WBC: 9.8 10*3/uL (ref 4.0–10.5)
nRBC: 0 % (ref 0.0–0.2)

## 2022-09-07 MED ORDER — APIXABAN 2.5 MG PO TABS
2.5000 mg | ORAL_TABLET | Freq: Two times a day (BID) | ORAL | Status: DC
  Administered 2022-09-07 (×2): 2.5 mg via ORAL
  Filled 2022-09-07 (×2): qty 1

## 2022-09-07 NOTE — Progress Notes (Signed)
Mobility Specialist Progress Note   09/07/22 1732  Mobility  Activity Ambulated with assistance in hallway  Level of Assistance Minimal assist, patient does 75% or more  Assistive Device Front wheel walker  Distance Ambulated (ft) 550 ft  RLE Weight Bearing WBAT  Activity Response Tolerated well  Mobility Referral Yes  $Mobility charge 1 Mobility   Pre Mobility:85 HR, 100% SpO2 During Mobility: 93 HR, 88% SpO2 Post Mobility: 74 HR, 99% SpO2  Received in bed eager for mobility and having minor c/o pain (5/10). Required min cues for RW mechanics + body posture, pt tends to ambulate w/ trunk slightly flexed which gradually progresses. Able to return back to room w/o fault but slight drop in SpO2(88%) on an unclear pleth, pt able to recover quickly. Pt left in chair w/ call bell in reach and needs met. RN notified about pain.     Holland Falling Mobility Specialist Please contact via SecureChat or  Rehab office at [phone]

## 2022-09-07 NOTE — Care Management Important Message (Signed)
Important Message  Patient Details  Name: Norman Hudson. MRN: [id number] Date of Birth: [birth year]   Medicare Important Message Given:  Yes     Hannah Beat 09/07/2022, 8:34 AM

## 2022-09-07 NOTE — Progress Notes (Signed)
     Subjective: Patient continues to look slowly better. 38cc old blood aspirated by IR yesterday. Drain placed with 35cc output overnight. Hgb up 9.8 this AM. Will resume low dose eliquis.   Objective:   VITALS:   Vitals:   09/06/22 1550 09/06/22 1556 09/06/22 2034 09/07/22 0237  BP: 128/62 (!) 120/93 122/63 125/65  Pulse: 72 79 72 71  Resp: 17 (!) '21 12 16  '$ Temp:   98.7 F (37.1 C)   TempSrc:   Oral   SpO2: 97% 99% 100% 94%  Weight:      Height:        RLE moderate right thigh swelling  Diffusely tender about the thigh in the  Sens DPN, SPN, TN intact  Motor EHL, ext, flex 5/5  DP 2+, PT 2+, No significant edema   Lab Results  Component Value Date   WBC 9.8 09/07/2022   HGB 9.8 (L) 09/07/2022   HCT 28.5 (L) 09/07/2022   MCV 96.6 09/07/2022   PLT 302 09/07/2022   BMET    Component Value Date/Time   NA 134 (L) 09/07/2022 0458   NA 140 09/04/2021 1448   K 4.2 09/07/2022 0458   CL 103 09/07/2022 0458   CO2 25 09/07/2022 0458   GLUCOSE 140 (H) 09/07/2022 0458   BUN 14 09/07/2022 0458   BUN 17 09/04/2021 1448   CREATININE 1.00 09/07/2022 0458   CALCIUM 8.7 (L) 09/07/2022 0458   EGFR 89 09/04/2021 1448   GFRNONAA >60 09/07/2022 0458   Assessment/Plan:     Principal Problem:   Hematoma of right hip  Status post right hip revision 08/23/2022 with significant postop swelling concerning for hematoma.  Gradual improvement since admission. S/p aspiration and drain placement 1/29.  Resume low dose eliquis 1/30. 2.'5mg'$  BID. Pending hgb and drain output next 24 hours may increase to '5mg'$  BID.   Currently with normal heart rate in sinus rhythm.   Norman Hudson 09/07/2022, 7:31 AM   Norman Constable, MD  Contact information:   [phone] 7am-5pm epic message Dr. Zachery Dakins, or call office for patient follow up: [phone] After hours and holidays please check [email or web address] for group call information for [facility]

## 2022-09-07 NOTE — TOC Initial Note (Signed)
Transition of Care (TOC) - Initial/Assessment Note    Patient Details  Name: Norman Hudson. MRN: [id number] Date of Birth: 13-Feb-1951  Transition of Care [facility]) CM/SW Contact:    Epifanio Lesches, RN Phone Number: 09/07/2022, 4:34 PM  Clinical Narrative:                 Readmitted with RLE hematoma, s/p revision R THA on 08/23/22,PMH LTHA LUmbar lam, TIA.       -  S/p aspiration and drain placement 1/29  From home with wife. PTA active with [facility] , HHPT. DME: rolling walker, cane, and BSC.  Transition of Care Department [facility]) has reviewed patient and will continue to monitor patient advancement through interdisciplinary progression rounds. If new patient transition needs arise, please place a TOC consult.       Patient Goals and CMS Choice            Expected Discharge Plan and Services                                              Prior Living Arrangements/Services                       Activities of Daily Living Home Assistive Devices/Equipment: CPAP, Hearing aid, Walker (specify type) ADL Screening (condition at time of admission) Patient's cognitive ability adequate to safely complete daily activities?: Yes Is the patient deaf or have difficulty hearing?: Yes Does the patient have difficulty seeing, even when wearing glasses/contacts?: No Does the patient have difficulty concentrating, remembering, or making decisions?: No Patient able to express need for assistance with ADLs?: Yes Does the patient have difficulty dressing or bathing?: No Independently performs ADLs?: Yes (appropriate for developmental age) Toileting: Independent Is this a change from baseline?: Change from baseline, expected to last <3 days In/Out Bed: Independent Is this a change from baseline?: Pre-admission baseline Walks in Home: Independent Is this a change from baseline?: Pre-admission baseline Does the patient have difficulty walking or  climbing stairs?: Yes Weakness of Legs: Right Weakness of Arms/Hands: None  Permission Sought/Granted                  Emotional Assessment              Admission diagnosis:  Hematoma [T14.8XXA] Hematoma of right hip [S70.01XA] Swelling of thigh [M79.89] Anemia, unspecified type [D64.9] S/P total right hip arthroplasty [Z96.641] Patient Active Problem List   Diagnosis Date Noted   Hematoma of right hip 09/03/2022   Persistent atrial fibrillation (HCC) 08/29/2022   Atrial fibrillation with RVR (HCC) 08/28/2022   Primary hypertension 08/28/2022   Atypical atrial flutter (HCC) 08/28/2022   Prosthetic joint implant failure, initial encounter (HCC) 08/23/2022   Long term (current) use of anticoagulants    Orthostatic hypotension    Benign hypertension    S/P total right hip arthroplasty    History of TIA (transient ischemic attack)    Atrial flutter, paroxysmal (HCC)    Postoperative anemia due to acute blood loss 04/27/2021   Degenerative joint disease of right hip 04/24/2021   Pacemaker ECG pattern 03/18/2021   TIA (transient ischemic attack) 03/18/2021   OSA on CPAP 03/17/2020   Uncontrolled REM sleep behavior disorder 04/10/2019   Loud snoring 04/10/2019   Excessive daytime sleepiness 04/10/2019   Other fatigue 04/10/2019  Nocturia more than twice per night 04/10/2019   Malignant hypertension 04/10/2019   Low testosterone 04/03/2018   Osteoarthritis of left hip 07/23/2016   S/P lumbar laminectomy 07/07/2015   PCP:  Shon Hale, MD Pharmacy:   [facility] [place], [place] - [address] [facility] [address] [place] [address] Phone: [phone] Fax: [phone]     Social Determinants of Health (SDOH) Social History: SDOH Screenings   Food Insecurity: No Food Insecurity (09/03/2022)  Housing: Low Risk  (09/03/2022)  Transportation Needs: No Transportation Needs (09/03/2022)  Utilities: Not At Risk  (09/03/2022)  Tobacco Use: Medium Risk (09/06/2022)   SDOH Interventions:     Readmission Risk Interventions     No data to display

## 2022-09-08 LAB — HEMOGLOBIN AND HEMATOCRIT, BLOOD
HCT: 30.1 % — ABNORMAL LOW (ref 39.0–52.0)
Hemoglobin: 9.9 g/dL — ABNORMAL LOW (ref 13.0–17.0)

## 2022-09-08 MED ORDER — SODIUM CHLORIDE 0.9% FLUSH
5.0000 mL | Freq: Every day | INTRAVENOUS | 0 refills | Status: AC
  Filled 2022-09-08: qty 300, 30d supply, fill #0

## 2022-09-08 MED ORDER — SODIUM CHLORIDE 0.9% FLUSH
5.0000 mL | Freq: Three times a day (TID) | INTRAVENOUS | Status: DC
  Administered 2022-09-08 – 2022-09-10 (×5): 5 mL

## 2022-09-08 NOTE — Progress Notes (Signed)
     Subjective: Patient continues to look slowly better.  Drain with 15cc output overnight. Hgb up 9.9 this AM. Discussed with Dr. Einar Gip this AM and someone from team will come by regarding resuming his BP meds.   Objective:   VITALS:   Vitals:   09/07/22 0759 09/07/22 1211 09/07/22 2152 09/08/22 0348  BP: 127/70 114/68 121/73 120/71  Pulse: 68 73 73 64  Resp: '17 19 18 14  '$ Temp: 98.6 F (37 C) 98.5 F (36.9 C) 97.7 F (36.5 C)   TempSrc: Oral  Oral   SpO2: 97% 96% 99% 97%  Weight:      Height:        RLE mild right thigh swelling  Diffusely tender about the thigh in the  Sens DPN, SPN, TN intact  Motor EHL, ext, flex 5/5  DP 2+, PT 2+, No significant edema   Lab Results  Component Value Date   WBC 9.8 09/07/2022   HGB 9.9 (L) 09/08/2022   HCT 30.1 (L) 09/08/2022   MCV 96.6 09/07/2022   PLT 302 09/07/2022   BMET    Component Value Date/Time   NA 134 (L) 09/07/2022 0458   NA 140 09/04/2021 1448   K 4.2 09/07/2022 0458   CL 103 09/07/2022 0458   CO2 25 09/07/2022 0458   GLUCOSE 140 (H) 09/07/2022 0458   BUN 14 09/07/2022 0458   BUN 17 09/04/2021 1448   CREATININE 1.00 09/07/2022 0458   CALCIUM 8.7 (L) 09/07/2022 0458   EGFR 89 09/04/2021 1448   GFRNONAA >60 09/07/2022 0458   Assessment/Plan:     Principal Problem:   Hematoma of right hip  Status post right hip revision 08/23/2022 with significant postop swelling concerning for hematoma.  Gradual improvement since admission. S/p aspiration and drain placement 1/29.  Will continue to hold Eliquis per cards team. Recs appreciated.   Currently with normal heart rate in sinus rhythm.   Sire Poet A Demareon Coldwell 09/08/2022, 7:31 AM   [name], MD  Contact information:   [phone] 7am-5pm epic message Dr. Zachery Dakins, or call office for patient follow up: [phone] After hours and holidays please check [email or web address] for group call information for [facility]

## 2022-09-08 NOTE — Progress Notes (Signed)
Mobility Specialist Progress Note   09/08/22 1140  Mobility  Activity Ambulated with assistance in hallway  Level of Assistance Minimal assist, patient does 75% or more  Assistive Device Cane  Distance Ambulated (ft) 770 ft  RLE Weight Bearing WBAT  Activity Response Tolerated well  Mobility Referral Yes  $Mobility charge 1 Mobility   Pre Mobility: 84 HR, 114/67 BP, 100% SpO2 on RA  During Mobility: 99 HR, 98% SpO2 on RA  Post Mobility: 77 HR, 98% SpO2 on RA  Received in bed having no complaints and agreeable. Standby A to EOB but pt requesting to stand w/ RW for stability, MinA to stand. Ambulated in hallway w/o fault and requiring VC on walking mechanics using cane but once understood pt having steady gait for remainder of ambulation. X1 seated rest break d/t fatigue then returned back to chair w/ all needs met.     Holland Falling Mobility Specialist Please contact via SecureChat or  Rehab office at [phone]

## 2022-09-08 NOTE — Progress Notes (Signed)
Supervising Physician: Ruthann Cancer  Patient Status:  [facility] - In-pt  Chief Complaint:  Pt seen in follow up of hematoma of right thigh, s/p drainage 1/29  Subjective:  Patient sitting in chair next to bed.  Awake and alert.  Reports feeling much better.  Is happy with how coloration and swelling is improving in his leg.  Is looking forward to discharge  Allergies: Bactrim [sulfamethoxazole-trimethoprim]  Medications: Prior to Admission medications   Medication Sig Start Date End Date Taking? Authorizing Provider  acetaminophen (TYLENOL) 325 MG tablet Take 650 mg by mouth every 6 (six) hours as needed for moderate pain.   Yes [provider]  cholecalciferol (VITAMIN D3) 25 MCG (1000 UT) tablet Take 1,000 Units by mouth daily.   Yes [provider]  clobetasol ointment (TEMOVATE) 1.74 % Apply 1 Application topically 2 (two) times daily as needed (itching). 05/14/21  Yes [provider]  clonazePAM (KLONOPIN) 0.5 MG tablet Take 0.5-1 tablets (0.25-0.5 mg total) by mouth at bedtime as needed for anxiety. Patient taking differently: Take 0.5 mg by mouth at bedtime as needed for anxiety (sleep). 07/13/22  Yes McCue, Janett Billow, NP  ELIQUIS 5 MG TABS tablet TAKE ONE TABLET BY MOUTH TWICE DAILY Patient taking differently: Take 2.5 mg by mouth 2 (two) times daily. 08/23/22  Yes Tolia, Sunit, DO  flecainide (TAMBOCOR) 50 MG tablet Take 1 tablet (50 mg total) by mouth 2 (two) times daily. 08/29/22  Yes Adrian Prows, MD  hydrochlorothiazide (HYDRODIURIL) 25 MG tablet Take 1 tablet (25 mg total) by mouth every morning. 08/23/22 11/21/22 Yes Tolia, Sunit, DO  metFORMIN (GLUCOPHAGE) 500 MG tablet Take 500 mg by mouth daily with breakfast.   Yes [provider]  metoprolol succinate (TOPROL-XL) 25 MG 24 hr tablet Take 1 tablet (25 mg total) by mouth every morning. 08/23/22 11/21/22 Yes Tolia, Sunit, DO  Multiple Vitamins-Minerals (MULTIVITAMIN WITH MINERALS) tablet Take 1  tablet by mouth daily.   Yes [provider]  Multiple Vitamins-Minerals (PRESERVISION AREDS 2 PO) Take 1 capsule by mouth daily.   Yes [provider]  olmesartan (BENICAR) 40 MG tablet Take 1 tablet (40 mg total) by mouth daily at 10 pm. 08/23/22 11/21/22 Yes Tolia, Sunit, DO  pyridOXINE (VITAMIN B-6) 100 MG tablet Take 100 mg by mouth daily.   Yes [provider]  rosuvastatin (CRESTOR) 20 MG tablet TAKE ONE TABLET AT BEDTIME. Patient taking differently: Take 20 mg by mouth daily. 04/30/22  Yes Tolia, Sunit, DO  spironolactone (ALDACTONE) 25 MG tablet Take 1 tablet (25 mg total) by mouth daily. 05/20/22 05/15/23 Yes Tolia, Sunit, DO  SYNTHROID 88 MCG tablet Take 88 mcg by mouth daily before breakfast. 06/11/22  Yes [provider]  valACYclovir (VALTREX) 1000 MG tablet Take 1,000 mg by mouth daily as needed (Herpes flare).   Yes [provider]  hydrALAZINE (APRESOLINE) 25 MG tablet Take 1 tablet (25 mg total) by mouth 3 (three) times daily as needed. Take as needed for blood pressure greater than 150/90 Patient not taking: Reported on 09/04/2022 07/08/22 11/05/22  Ernst Spell, NP  ondansetron (ZOFRAN) 4 MG tablet Take 1 tablet (4 mg total) by mouth every 8 (eight) hours as needed for up to 14 days for nausea or vomiting. Patient not taking: Reported on 09/04/2022 08/27/22 09/10/22  Willaim Sheng, MD     Vital Signs: BP 125/70 (BP Location: Right Arm)   Pulse 67   Temp 98.6 F (37 C) (Oral)  Resp 17   Ht '5\' 7"'$  (1.702 m)   Wt 210 lb (95.3 kg)   SpO2 98%   BMI 32.89 kg/m   Physical Exam Constitutional:      General: He is not in acute distress. HENT:     Head: Normocephalic and atraumatic.     Mouth/Throat:     Pharynx: Oropharynx is clear.  Eyes:     General: No scleral icterus.    Extraocular Movements: Extraocular movements intact.  Pulmonary:     Effort: Pulmonary effort is normal. No respiratory distress.   Musculoskeletal:     Right lower leg: Edema present.  Skin:    General: Skin is warm and dry.     Findings: Bruising present.  Neurological:     General: No focal deficit present.     Mental Status: He is alert and oriented to person, place, and time.   Drain Location: Right thigh Size: Fr size: 14 Fr Date of placement: 09/06/22 Currently to: Drain collection device: suction bulb 24 hour output:  Output by Drain (mL) 09/06/22 0700 - 09/06/22 1459 09/06/22 1500 - 09/06/22 2259 09/06/22 2300 - 09/07/22 0659 09/07/22 0700 - 09/07/22 1459 09/07/22 1500 - 09/07/22 2259 09/07/22 2300 - 09/08/22 0659 09/08/22 0700 - 09/08/22 1459 09/08/22 1500 - 09/08/22 1855  Closed System Drain 1 Right Thigh Bulb (JP)  '20 15  10 5 '$ 160     Current examination: Flushes/aspirates easily.  Insertion site unremarkable. Suture and stat lock in place. Dressed appropriately.   Imaging: IR US Guide Bx Asp/Drain  Result Date: 09/06/2022 INDICATION: POSTOP RIGHT LATERAL THIGH HEMATOMA, KNEE REPLACEMENT EXAM: ULTRASOUND DRAINAGE OF THE RIGHT LATERAL THIGH SUPERFICIAL HEMATOMA WITH A 14 FRENCH DRAIN MEDICATIONS: 1% lidocaine local ANESTHESIA/SEDATION: Moderate (conscious) sedation was employed during this procedure. A total of Versed 1.0 mg and Fentanyl 50 mcg was administered intravenously by the radiology nurse. Total intra-service moderate Sedation Time: 12 minutes. The patient's level of consciousness and vital signs were monitored continuously by radiology nursing throughout the procedure under my direct supervision. COMPLICATIONS: None immediate. PROCEDURE: Informed written consent was obtained from the patient after a thorough discussion of the procedural risks, benefits and alternatives. All questions were addressed. Maximal Sterile Barrier Technique was utilized including caps, mask, sterile gowns, sterile gloves, sterile drape, hand hygiene and skin antiseptic. A timeout was performed prior to the initiation of  the procedure. Previous imaging reviewed. Preliminary ultrasound performed. The lateral superficial thigh hematoma is hypoechoic complex but liquified. Overlying skin marked. Under sterile conditions local anesthesia, an 18 gauge needle was advanced percutaneously into the fluid collection. Needle position confirmed with ultrasound. Images obtained for documentation. There was return of old dark blood. Guidewire inserted easily followed by tract dilatation insert a 14 French drain. Drain catheter position confirmed with ultrasound. Syringe aspiration yielded 200 cc old dark blood. No purulent component. Sample was not sent for culture. Catheter secured with a silk suture and connected to external suction bulb. Sterile dressing applied. No immediate complication. Patient tolerated the procedure well. IMPRESSION: Successful ultrasound 14 French drain placement within the right lateral thigh hematoma. Electronically Signed   By: Jerilynn Mages.  Shick M.D.   On: 09/06/2022 16:16    Labs:  CBC: Recent Labs    09/04/22 1011 09/05/22 0331 09/06/22 0322 09/07/22 0458 09/08/22 0048  WBC 11.4* 9.4 8.7 9.8  --   HGB 7.2* 6.9* 7.6* 9.8* 9.9*  HCT 21.8* 21.3* 22.4* 28.5* 30.1*  PLT 273 288 257 302  --  COAGS: Recent Labs    09/04/22 1011 09/05/22 0331 09/06/22 0322  INR 1.6* 1.4* 1.2    BMP: Recent Labs    08/24/22 0431 08/28/22 0844 09/03/22 1146 09/03/22 1301 09/07/22 0458  NA 133* 137 133* 136 134*  K 4.0 3.5 3.8 3.8 4.2  CL 101 102 100 100 103  CO2 '22 26 23  '$ --  25  GLUCOSE 179* 161* 171* 160* 140*  BUN 25* 19 41* 40* 14  CALCIUM 8.2* 8.5* 8.9  --  8.7*  CREATININE 1.06 0.75 1.38* 1.30* 1.00  GFRNONAA >60 >60 54*  --  >60    LIVER FUNCTION TESTS: Recent Labs    08/13/22 1332 09/03/22 1146  BILITOT 0.6 1.7*  AST 27 39  ALT 23 27  ALKPHOS 42 37*  PROT 7.3 6.5  ALBUMIN 4.9 3.3*    Assessment and Plan: Hematoma of Right thigh, s/p drainage 09/06/22 Continue TID flushes with 5 cc  NS. Record output Q shift. Dressing changes QD or PRN if soiled.  Call IR APP or on call IR MD if difficulty flushing or sudden change in drain output.   Discharge planning: Outpatient orders placed. IR scheduler will contact patient with date/time of appointment. Patient will need to flush drain QD with 5 cc NS, record output QD, dressing changes every 2-3 days or earlier if soiled.   IR will continue to follow - please call with questions or concerns.   Electronically Signed: Pasty Spillers, PA 09/08/2022, 5:39 PM   I spent a total of 15 Minutes at the the patient's bedside AND on the patient's hospital floor or unit, greater than 50% of which was counseling/coordinating care for drain management

## 2022-09-08 NOTE — Consult Note (Signed)
CARDIOLOGY CONSULT NOTE  Patient ID: Norman Hudson. MRN: [id number] DOB/AGE: Nov 08, 1950 72 y.o.  Admit date: 09/03/2022 Referring Physician  Dr Blanchie Dessert Primary Physician:  Shon Hale, MD Reason for Consultation  Afib  Patient ID: Norman Hudson., male    DOB: 11-29-1950, 72 y.o.   MRN: [id number]  Chief Complaint  Patient presents with   Rt Hip replacement Issues   HPI:    Norman Hudson.  is a 72 y.o. 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   Patient has done well postoperatively, stable hemoglobin, was about to be discharged home but then developed atrial fibrillation with rapid ventricular response. Today he is in normal sinus rhythm. Additionally, he developed a large hematoma in his right thigh which required JP drain placement and 2 units of pRBC's. His leg is much better now and there is minimal output from the JP drain and edema has significantly improved. Patient is feeling well and walking around with issues. His hemoglobin has been stable and he remains in normal sinus rhythm. Patient denies chest pain, shortness of breath, palpitations, diaphoresis, syncope, PND, orthopnea.      Past Medical History:  Diagnosis Date   Acne    Anxiety    takes Xanax daily as needed   Arthritis    Atrial flutter, paroxysmal (HCC)    History of kidney stones    Hyperlipidemia    takes Pravastatin daily   Hypertension    takes Amlodipine and Valsartan-HCTZ daily   Hypothyroidism    takes Synthroid daily   Inguinal hernia    left side   Internal hemorrhoids    Joint pain    Kidney stones    Pre-diabetes    Rosacea    TIA (transient ischemic attack) 06/2019   Past Surgical History:   Procedure Laterality Date   CARDIOVERSION N/A 08/28/2022   Procedure: CARDIOVERSION;  Surgeon: Yates Decamp, MD;  Location: [facility];  Service: Cardiovascular;  Laterality: N/A;   CARDIOVERSION N/A 08/29/2022   Procedure: CARDIOVERSION;  Surgeon: Yates Decamp, MD;  Location: [facility];  Service: Cardiovascular;  Laterality: N/A;   COLONOSCOPY     EXTRACORPOREAL SHOCK WAVE LITHOTRIPSY Right 12/27/2019   Procedure: EXTRACORPOREAL SHOCK WAVE LITHOTRIPSY (ESWL);  Surgeon: Malen Gauze, MD;  Location: [facility];  Service: Urology;  Laterality: Right;   IR US GUIDE BX ASP/DRAIN  09/06/2022   LITHOTRIPSY  09/2015   LUMBAR LAMINECTOMY WITH COFLEX 1 LEVEL N/A 07/07/2015   Procedure: LUMBAR FOUR-FIVE LUMBAR LAMINECTOMY WITH COFLEX;  Surgeon: Tia Alert, MD;  Location: [facility];  Service: Neurosurgery;  Laterality: N/A;   TONSILLECTOMY     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 HEAD AND LINER EXCHANGE. POSSIBLE CUP REVISION;  Surgeon: Joen Laura, MD;  Location: [facility];  Service: Orthopedics;  Laterality: Right;   Social History   Tobacco Use   Smoking status: Former  Packs/day: 1.00    Years: 15.00    Total pack years: 15.00    Types: Cigarettes    Quit date: 02/12/1989    Years since quitting: 33.5   Smokeless tobacco: Never   Tobacco comments:    quit smoking in 1991  Substance Use Topics   Alcohol use: No    Family History  Problem Relation Age of Onset   Hypertension Father    Sleep disorder Father    CVA Father    Cancer Maternal Grandfather    Hypertension Paternal Grandmother    Diabetes  Paternal Grandmother    Alcoholism Mother    Hypertension Sister    Aneurysm Sister    Atrial fibrillation Sister    Other Neg Hx        hypogonadism   Sleep apnea Neg Hx     Marital Status: Married  ROS  Review of Systems  Cardiovascular:  Positive for leg swelling.   Objective      09/08/2022    7:47 AM 09/08/2022    3:48 AM 09/07/2022    9:52 PM  Vitals with BMI  Systolic 123 120 161  Diastolic 74 71 73  Pulse 65 64 73    Blood pressure 123/74, pulse 65, temperature 98 F (36.7 C), temperature source Oral, resp. rate 12, height 5\' 7"  (1.702 m), weight 95.3 kg, SpO2 100 %.    Physical Exam Vitals reviewed.  HENT:     Head: Normocephalic and atraumatic.  Cardiovascular:     Rate and Rhythm: Normal rate and regular rhythm.     Pulses: Normal pulses.     Heart sounds: Normal heart sounds.  Pulmonary:     Effort: Pulmonary effort is normal.     Breath sounds: Normal breath sounds.  Abdominal:     General: Bowel sounds are normal.  Musculoskeletal:     Right lower leg: Edema (mild edema R thigh where hematoma is draining but not significantly swollen compared to LLE) present.     Left lower leg: No edema.  Skin:    General: Skin is warm and dry.  Neurological:     Mental Status: He is alert.    Laboratory examination:   Recent Labs    08/28/22 0844 09/03/22 1146 09/03/22 1301 09/07/22 0458  NA 137 133* 136 134*  K 3.5 3.8 3.8 4.2  CL 102 100 100 103  CO2 26 23  --  25  GLUCOSE 161* 171* 160* 140*  BUN 19 41* 40* 14  CREATININE 0.75 1.38* 1.30* 1.00  CALCIUM 8.5* 8.9  --  8.7*  GFRNONAA >60 54*  --  >60   estimated creatinine clearance is 73.5 mL/min (by C-G formula based on SCr of 1 mg/dL).     Latest Ref Rng & Units 09/07/2022    4:58 AM 09/03/2022    1:01 PM 09/03/2022   11:46 AM  CMP  Glucose 70 - 99 mg/dL 096  045  409   BUN 8 - 23 mg/dL 14  40  41   Creatinine 0.61 - 1.24 mg/dL 8.11  9.14  7.82   Sodium 135 - 145 mmol/L 134  136  133    Potassium 3.5 - 5.1 mmol/L 4.2  3.8  3.8   Chloride 98 - 111 mmol/L 103  100  100   CO2 22 - 32 mmol/L 25   23   Calcium 8.9 - 10.3 mg/dL 8.7   8.9   Total Protein 6.5 - 8.1 g/dL  6.5   Total Bilirubin 0.3 - 1.2 mg/dL   1.7   Alkaline Phos 38 - 126 U/L   37   AST 15 - 41 U/L   39   ALT 0 - 44 U/L   27       Latest Ref Rng & Units 09/08/2022   12:48 AM 09/07/2022    4:58 AM 09/06/2022    3:22 AM  CBC  WBC 4.0 - 10.5 K/uL  9.8  8.7   Hemoglobin 13.0 - 17.0 g/dL 9.9  9.8  7.6   Hematocrit 39.0 - 52.0 % 30.1  28.5  22.4   Platelets 150 - 400 K/uL  302  257    Lipid Panel No results for input(s): "CHOL", "TRIG", "LDLCALC", "VLDL", "HDL", "CHOLHDL", "LDLDIRECT" in the last 8760 hours.  HEMOGLOBIN A1C Lab Results  Component Value Date   HGBA1C 6.4 (H) 08/13/2022   MPG 137 08/13/2022   TSH No results for input(s): "TSH" in the last 8760 hours. BNP (last 3 results) No results for input(s): "BNP" in the last 8760 hours. Cardiac Panel (last 3 results) No results for input(s): "CKTOTAL", "CKMB", "TROPONINIHS", "RELINDX" in the last 72 hours.   Medications and allergies   Allergies  Allergen Reactions   Bactrim [Sulfamethoxazole-Trimethoprim] Hives and Itching    "threw me for a loop, hives, itching, extreme fatigue"     Current Meds  Medication Sig   acetaminophen (TYLENOL) 325 MG tablet Take 650 mg by mouth every 6 (six) hours as needed for moderate pain.   cholecalciferol (VITAMIN D3) 25 MCG (1000 UT) tablet Take 1,000 Units by mouth daily.   clobetasol ointment (TEMOVATE) 0.05 % Apply 1 Application topically 2 (two) times daily as needed (itching).   clonazePAM (KLONOPIN) 0.5 MG tablet Take 0.5-1 tablets (0.25-0.5 mg total) by mouth at bedtime as needed for anxiety. (Patient taking differently: Take 0.5 mg by mouth at bedtime as needed for anxiety (sleep).)   ELIQUIS 5 MG TABS tablet TAKE ONE TABLET BY MOUTH TWICE DAILY (Patient taking differently: Take 2.5 mg by mouth 2  (two) times daily.)   flecainide (TAMBOCOR) 50 MG tablet Take 1 tablet (50 mg total) by mouth 2 (two) times daily.   hydrochlorothiazide (HYDRODIURIL) 25 MG tablet Take 1 tablet (25 mg total) by mouth every morning.   metFORMIN (GLUCOPHAGE) 500 MG tablet Take 500 mg by mouth daily with breakfast.   [EXPIRED] methocarbamol (ROBAXIN) 500 MG tablet Take 1 tablet (500 mg total) by mouth every 8 (eight) hours as needed for up to 10 days for muscle spasms.   metoprolol succinate (TOPROL-XL) 25 MG 24 hr tablet Take 1 tablet (25 mg total) by mouth every morning.   Multiple Vitamins-Minerals (MULTIVITAMIN WITH MINERALS) tablet Take 1 tablet by mouth daily.   Multiple Vitamins-Minerals (PRESERVISION AREDS 2 PO) Take 1 capsule by mouth daily.   olmesartan (BENICAR) 40 MG tablet Take 1 tablet (40 mg total) by mouth daily at 10 pm.   [EXPIRED] oxyCODONE (ROXICODONE) 5 MG immediate release tablet Take 1-2 tablets (5-10 mg total) by mouth every 4 (four) hours as needed for up to 7 days for severe pain or moderate pain. (Patient taking differently: Take 15 mg by mouth every 4 (four) hours as needed for severe pain or moderate pain.)   pyridOXINE (VITAMIN B-6) 100 MG tablet Take 100 mg by mouth daily.   rosuvastatin (CRESTOR) 20 MG tablet TAKE ONE TABLET AT BEDTIME. (Patient taking differently: Take 20 mg by mouth daily.)  spironolactone (ALDACTONE) 25 MG tablet Take 1 tablet (25 mg total) by mouth daily.   SYNTHROID 88 MCG tablet Take 88 mcg by mouth daily before breakfast.   valACYclovir (VALTREX) 1000 MG tablet Take 1,000 mg by mouth daily as needed (Herpes flare).   [DISCONTINUED] traMADol (ULTRAM) 50 MG tablet Take 100 mg by mouth daily.    Scheduled Meds:  sodium chloride   Intravenous Once   cholecalciferol  1,000 Units Oral Daily   flecainide  50 mg Oral BID   hydrochlorothiazide  25 mg Oral q morning   levothyroxine  88 mcg Oral QAC breakfast   metFORMIN  500 mg Oral Q breakfast   metoprolol  succinate  25 mg Oral Daily   multivitamin with minerals  1 tablet Oral Daily   pyridOXINE  100 mg Oral Daily   rosuvastatin  20 mg Oral QHS   spironolactone  25 mg Oral Daily   Continuous Infusions:  methocarbamol (ROBAXIN) IV     PRN Meds:.acetaminophen, clonazePAM, hydrALAZINE, HYDROmorphone (DILAUDID) injection, methocarbamol **OR** methocarbamol (ROBAXIN) IV, ondansetron **OR** ondansetron (ZOFRAN) IV, oxyCODONE, oxyCODONE, zolpidem   I/O last 3 completed shifts: In: 920 [P.O.:920] Out: 3100 [Urine:3050; Drains:50] Total I/O In: -  Out: 150 [Drains:150]  Net IO Since Admission: -136 mL [09/08/22 1020]   Radiology:   Imaging results have been reviewed and IR US Guide Bx Asp/Drain  Result Date: 09/06/2022 INDICATION: POSTOP RIGHT LATERAL THIGH HEMATOMA, KNEE REPLACEMENT EXAM: ULTRASOUND DRAINAGE OF THE RIGHT LATERAL THIGH SUPERFICIAL HEMATOMA WITH A 14 FRENCH DRAIN MEDICATIONS: 1% lidocaine local ANESTHESIA/SEDATION: Moderate (conscious) sedation was employed during this procedure. A total of Versed 1.0 mg and Fentanyl 50 mcg was administered intravenously by the radiology nurse. Total intra-service moderate Sedation Time: 12 minutes. The patient's level of consciousness and vital signs were monitored continuously by radiology nursing throughout the procedure under my direct supervision. COMPLICATIONS: None immediate. PROCEDURE: Informed written consent was obtained from the patient after a thorough discussion of the procedural risks, benefits and alternatives. All questions were addressed. Maximal Sterile Barrier Technique was utilized including caps, mask, sterile gowns, sterile gloves, sterile drape, hand hygiene and skin antiseptic. A timeout was performed prior to the initiation of the procedure. Previous imaging reviewed. Preliminary ultrasound performed. The lateral superficial thigh hematoma is hypoechoic complex but liquified. Overlying skin marked. Under sterile conditions local  anesthesia, an 18 gauge needle was advanced percutaneously into the fluid collection. Needle position confirmed with ultrasound. Images obtained for documentation. There was return of old dark blood. Guidewire inserted easily followed by tract dilatation insert a 14 French drain. Drain catheter position confirmed with ultrasound. Syringe aspiration yielded 200 cc old dark blood. No purulent component. Sample was not sent for culture. Catheter secured with a silk suture and connected to external suction bulb. Sterile dressing applied. No immediate complication. Patient tolerated the procedure well. IMPRESSION: Successful ultrasound 14 French drain placement within the right lateral thigh hematoma. Electronically Signed   By: Judie Petit.  Shick M.D.   On: 09/06/2022 16:16    Cardiac Studies:   Echocardiogram: 08/12/2022:  Normal LV systolic function with visual EF 60-65%. Left ventricle cavity  is normal in size. Normal left ventricular wall thickness. Normal global  wall motion. Normal diastolic filling pattern, normal LAP.  Aortic valve sclerosis without stenosis.  Mild tricuspid regurgitation. No evidence of pulmonary hypertension.  Compared to 04/29/2021 otherwise no significant change    Stress Testing:  Exercise myoview stress test  04/23/2019: Patient exercised for a total of  6:13 min., achieving approximately 7 METs. Normal BP response. Exercise was terminated due to fatigue/weakness and achieving THR. Normal myocardial perfusion. All segments of left ventricle demonstrated normal wall motion and thickening. Stress LV EF is hyperdynamic 75%. Low risk study.    Tele 09/08/2022: normal sinus rhythm, rate 74 bpm   EKG 08/28/2022: Atypical a flutter with rapid ventricular response, nonspecific T abnormality.   EKG 08/12/2022: Normal sinus rhythm at rate of 60 bpm.  Assessment & Recommendations:    Atrial fibrillation/atypical atrial flutter with rapid ventricular response, currently NSR  CHA2DS2-VASc  Score is 4.  Yearly risk of stroke: 5% (A, HTN, TIA).  Score of 1=0.6; 2=2.2; 3=3.2; 4=4.8; 5=7.2; 6=9.8; 7=>9.8) -(CHF; HTN; vasc disease DM,  Male = 1; Age <65 =0; 65-74 = 1,  >75 =2; stroke/embolism= 2).   Ok to discharge home from cardiac standpoint. START ELIQUIS 5 MG BID ON SATURDAY 09/11/2022. Discharge medications will include metoprolol succinate 25 mg daily, flecainide 100 mg p.o. twice daily. Ok to hold other BP meds for soft pressures. Patient has follow-up with Dr Jacinto Halim on Monday 09/13/2022.   Primary hypertension Controlled but pressures have been soft Ok to continue holding meds for low BP   Remote history of TIA without stroke He has not had any TIA-like symptoms, he is otherwise stable, he has done well postsurgically.     Clotilde Dieter, DO, [facility] 09/08/2022, 10:20 AM Office: [phone]

## 2022-09-09 ENCOUNTER — Other Ambulatory Visit (HOSPITAL_COMMUNITY): Payer: Self-pay

## 2022-09-09 LAB — HEMOGLOBIN AND HEMATOCRIT, BLOOD
HCT: 30.5 % — ABNORMAL LOW (ref 39.0–52.0)
Hemoglobin: 10 g/dL — ABNORMAL LOW (ref 13.0–17.0)

## 2022-09-09 NOTE — Progress Notes (Signed)
Mobility Specialist Progress Note   09/09/22 1126  Mobility  Activity Ambulated with assistance in hallway  Level of Assistance Contact guard assist, steadying assist  Assistive Device Cane  Distance Ambulated (ft) 1100 ft  RLE Weight Bearing WBAT  Activity Response Tolerated well  Mobility Referral Yes  $Mobility charge 1 Mobility   Pre Mobility: 81 HR, 99% SpO2 on RA  During Mobility: 112 HR, 100% SpO2 on RA  Post Mobility: 78 HR, 98% SpO2 on RA   Received in bed c/o RLE pain(6/10) but agreeable. Able to ambulate in hallway w/ steady gait and no faults. Able to finish ambulation w/o cane and just CGA while guarding rail. Returned to chair w/o fault, call bell in reach and needs met.  Holland Falling Mobility Specialist Please contact via SecureChat or  Rehab office at [phone]

## 2022-09-09 NOTE — Progress Notes (Signed)
Day shift RN stated that 71ms out of JP drain on her shift.   243m on my shift '@0600'$ 

## 2022-09-09 NOTE — Progress Notes (Signed)
     Subjective: Patient continues to show gradual improvement day-to-day.  Hemoglobin up to 10.0 today.  He did a lot of walking yesterday with the mobility specialist.  He continues to feel better.  Anticipate discharge home tomorrow.  Cardiology saw the patient yesterday recs are appreciated.  Will plan to resume Eliquis this weekend with close follow-up.  I will plan to see him in clinic Tuesday.  Drain only putting out about 20 cc per shift.  Objective:   VITALS:   Vitals:   09/08/22 0747 09/08/22 0805 09/08/22 1514 09/08/22 2044  BP: 123/74  125/70 112/73  Pulse: 65  67 75  Resp: '12  17 11  '$ Temp:  98 F (36.7 C) 98.6 F (37 C) 98.2 F (36.8 C)  TempSrc:  Oral Oral Oral  SpO2: 100%  98% 99%  Weight:      Height:        RLE mild right thigh swelling improved  Diffusely tender about the thigh in the  Sens DPN, SPN, TN intact  Motor EHL, ext, flex 5/5  DP 2+, PT 2+, No significant edema  Drain with small amount of dark sanguinous fluid.   Lab Results  Component Value Date   WBC 9.8 09/07/2022   HGB 10.0 (L) 09/09/2022   HCT 30.5 (L) 09/09/2022   MCV 96.6 09/07/2022   PLT 302 09/07/2022   BMET    Component Value Date/Time   NA 134 (L) 09/07/2022 0458   NA 140 09/04/2021 1448   K 4.2 09/07/2022 0458   CL 103 09/07/2022 0458   CO2 25 09/07/2022 0458   GLUCOSE 140 (H) 09/07/2022 0458   BUN 14 09/07/2022 0458   BUN 17 09/04/2021 1448   CREATININE 1.00 09/07/2022 0458   CALCIUM 8.7 (L) 09/07/2022 0458   EGFR 89 09/04/2021 1448   GFRNONAA >60 09/07/2022 0458   Assessment/Plan:     Principal Problem:   Hematoma of right hip  Status post right hip revision 08/23/2022 with significant postop swelling concerning for hematoma.  Gradual improvement since admission. S/p aspiration and drain placement 1/29.  Will continue to hold Eliquis per cards team. Recs appreciated.   Currently with normal heart rate in sinus rhythm.  Anticipate discharge home 2/2.   Follow-up in clinic with myself 2/6 for wound check and drain removal.    Plan to resume Eliquis 2/3 per cardiology.   Zhania Shaheen A Mateo Overbeck 09/09/2022, 7:36 AM   [name], MD  Contact information:   [phone] 7am-5pm epic message Dr. Zachery Dakins, or call office for patient follow up: [phone] After hours and holidays please check [email or web address] for group call information for [facility]

## 2022-09-09 NOTE — Progress Notes (Signed)
Referring Physician(s): Dr. [name]  Supervising Physician: Ruthann Cancer  Patient Status:  [facility] - In-pt  Chief Complaint:  Patient seen in follow up of right lateral leg hematoma drain placement 09/06/22  Subjective:  Concerned that there is increased output today Sitting comfortably with legs elevated in chair bedside.  Awake and alert.  Expecting discharge tomorrow  Allergies: Bactrim [sulfamethoxazole-trimethoprim]  Medications: Prior to Admission medications   Medication Sig Start Date End Date Taking? Authorizing Provider  acetaminophen (TYLENOL) 325 MG tablet Take 650 mg by mouth every 6 (six) hours as needed for moderate pain.   Yes [provider]  cholecalciferol (VITAMIN D3) 25 MCG (1000 UT) tablet Take 1,000 Units by mouth daily.   Yes [provider]  clobetasol ointment (TEMOVATE) 3.87 % Apply 1 Application topically 2 (two) times daily as needed (itching). 05/14/21  Yes [provider]  clonazePAM (KLONOPIN) 0.5 MG tablet Take 0.5-1 tablets (0.25-0.5 mg total) by mouth at bedtime as needed for anxiety. Patient taking differently: Take 0.5 mg by mouth at bedtime as needed for anxiety (sleep). 07/13/22  Yes McCue, Janett Billow, NP  ELIQUIS 5 MG TABS tablet TAKE ONE TABLET BY MOUTH TWICE DAILY Patient taking differently: Take 2.5 mg by mouth 2 (two) times daily. 08/23/22  Yes Tolia, Sunit, DO  flecainide (TAMBOCOR) 50 MG tablet Take 1 tablet (50 mg total) by mouth 2 (two) times daily. 08/29/22  Yes Adrian Prows, MD  hydrochlorothiazide (HYDRODIURIL) 25 MG tablet Take 1 tablet (25 mg total) by mouth every morning. 08/23/22 11/21/22 Yes Tolia, Sunit, DO  metFORMIN (GLUCOPHAGE) 500 MG tablet Take 500 mg by mouth daily with breakfast.   Yes [provider]  metoprolol succinate (TOPROL-XL) 25 MG 24 hr tablet Take 1 tablet (25 mg total) by mouth every morning. 08/23/22 11/21/22 Yes Tolia, Sunit, DO  Multiple Vitamins-Minerals (MULTIVITAMIN  WITH MINERALS) tablet Take 1 tablet by mouth daily.   Yes [provider]  Multiple Vitamins-Minerals (PRESERVISION AREDS 2 PO) Take 1 capsule by mouth daily.   Yes [provider]  olmesartan (BENICAR) 40 MG tablet Take 1 tablet (40 mg total) by mouth daily at 10 pm. 08/23/22 11/21/22 Yes Tolia, Sunit, DO  pyridOXINE (VITAMIN B-6) 100 MG tablet Take 100 mg by mouth daily.   Yes [provider]  rosuvastatin (CRESTOR) 20 MG tablet TAKE ONE TABLET AT BEDTIME. Patient taking differently: Take 20 mg by mouth daily. 04/30/22  Yes Tolia, Sunit, DO  sodium chloride flush (NS) 0.9 % SOLN 5 mLs by Intracatheter route daily. (Must discard remaining 30m in syringe) 09/08/22 10/09/22 Yes Travonte Byard, PA  spironolactone (ALDACTONE) 25 MG tablet Take 1 tablet (25 mg total) by mouth daily. 05/20/22 05/15/23 Yes Tolia, Sunit, DO  SYNTHROID 88 MCG tablet Take 88 mcg by mouth daily before breakfast. 06/11/22  Yes [provider]  valACYclovir (VALTREX) 1000 MG tablet Take 1,000 mg by mouth daily as needed (Herpes flare).   Yes [provider]  hydrALAZINE (APRESOLINE) 25 MG tablet Take 1 tablet (25 mg total) by mouth 3 (three) times daily as needed. Take as needed for blood pressure greater than 150/90 Patient not taking: Reported on 09/04/2022 07/08/22 11/05/22  SErnst Spell NP  ondansetron (ZOFRAN) 4 MG tablet Take 1 tablet (4 mg total) by mouth every 8 (eight) hours as needed for up to 14 days for nausea or vomiting. Patient not taking: Reported on 09/04/2022 08/27/22 09/10/22  MWillaim Sheng MD     Vital  Signs: BP 122/70 (BP Location: Left Arm)   Pulse 71   Temp 98 F (36.7 C) (Oral)   Resp 15   Ht '5\' 7"'$  (1.702 m)   Wt 210 lb (95.3 kg)   SpO2 99%   BMI 32.89 kg/m   Physical Exam Constitutional:      General: He is awake.  HENT:     Head: Normocephalic and atraumatic.     Mouth/Throat:     Mouth: Mucous membranes are dry.     Pharynx:  Oropharynx is clear.  Eyes:     General: No scleral icterus.    Extraocular Movements: Extraocular movements intact.  Cardiovascular:     Rate and Rhythm: Normal rate.  Pulmonary:     Effort: Pulmonary effort is normal. No respiratory distress.  Musculoskeletal:     Comments: Mild swelling, firmness, and discoloration of right lateral thigh.  Appropriately tender  Skin:    General: Skin is warm and dry.  Neurological:     General: No focal deficit present.     Mental Status: He is alert and oriented to person, place, and time.  Psychiatric:        Mood and Affect: Mood normal.        Behavior: Behavior normal. Behavior is cooperative.   Drain Location: Right lateral thigh Size: Fr size: 14 Fr Date of placement: 09/06/22  Currently to: Drain collection device: suction bulb 24 hour output:  Output by Drain (mL) 09/07/22 0700 - 09/07/22 1459 09/07/22 1500 - 09/07/22 2259 09/07/22 2300 - 09/08/22 0659 09/08/22 0700 - 09/08/22 1459 09/08/22 1500 - 09/08/22 2259 09/08/22 2300 - 09/09/22 0659 09/09/22 0700 - 09/09/22 1459 09/09/22 1500 - 09/09/22 1534  Closed System Drain 1 Right Thigh Bulb (JP)  '10 5 10  20 '$ 70    Current examination: Flushes/aspirates easily.  Insertion site unremarkable. Suture and stat lock in place. Dressed appropriately.   Imaging: IR US Guide Bx Asp/Drain  Result Date: 09/06/2022 INDICATION: POSTOP RIGHT LATERAL THIGH HEMATOMA, KNEE REPLACEMENT EXAM: ULTRASOUND DRAINAGE OF THE RIGHT LATERAL THIGH SUPERFICIAL HEMATOMA WITH A 14 FRENCH DRAIN MEDICATIONS: 1% lidocaine local ANESTHESIA/SEDATION: Moderate (conscious) sedation was employed during this procedure. A total of Versed 1.0 mg and Fentanyl 50 mcg was administered intravenously by the radiology nurse. Total intra-service moderate Sedation Time: 12 minutes. The patient's level of consciousness and vital signs were monitored continuously by radiology nursing throughout the procedure under my direct supervision.  COMPLICATIONS: None immediate. PROCEDURE: Informed written consent was obtained from the patient after a thorough discussion of the procedural risks, benefits and alternatives. All questions were addressed. Maximal Sterile Barrier Technique was utilized including caps, mask, sterile gowns, sterile gloves, sterile drape, hand hygiene and skin antiseptic. A timeout was performed prior to the initiation of the procedure. Previous imaging reviewed. Preliminary ultrasound performed. The lateral superficial thigh hematoma is hypoechoic complex but liquified. Overlying skin marked. Under sterile conditions local anesthesia, an 18 gauge needle was advanced percutaneously into the fluid collection. Needle position confirmed with ultrasound. Images obtained for documentation. There was return of old dark blood. Guidewire inserted easily followed by tract dilatation insert a 14 French drain. Drain catheter position confirmed with ultrasound. Syringe aspiration yielded 200 cc old dark blood. No purulent component. Sample was not sent for culture. Catheter secured with a silk suture and connected to external suction bulb. Sterile dressing applied. No immediate complication. Patient tolerated the procedure well. IMPRESSION: Successful ultrasound 14 French drain placement within the right lateral thigh  hematoma. Electronically Signed   By: Jerilynn Mages.  Shick M.D.   On: 09/06/2022 16:16    Labs:  CBC: Recent Labs    09/04/22 1011 09/05/22 0331 09/06/22 0322 09/07/22 0458 09/08/22 0048 09/09/22 0617  WBC 11.4* 9.4 8.7 9.8  --   --   HGB 7.2* 6.9* 7.6* 9.8* 9.9* 10.0*  HCT 21.8* 21.3* 22.4* 28.5* 30.1* 30.5*  PLT 273 288 257 302  --   --     COAGS: Recent Labs    09/04/22 1011 09/05/22 0331 09/06/22 0322  INR 1.6* 1.4* 1.2    BMP: Recent Labs    08/24/22 0431 08/28/22 0844 09/03/22 1146 09/03/22 1301 09/07/22 0458  NA 133* 137 133* 136 134*  K 4.0 3.5 3.8 3.8 4.2  CL 101 102 100 100 103  CO2 '22 26 23  '$ --   25  GLUCOSE 179* 161* 171* 160* 140*  BUN 25* 19 41* 40* 14  CALCIUM 8.2* 8.5* 8.9  --  8.7*  CREATININE 1.06 0.75 1.38* 1.30* 1.00  GFRNONAA >60 >60 54*  --  >60    LIVER FUNCTION TESTS: Recent Labs    08/13/22 1332 09/03/22 1146  BILITOT 0.6 1.7*  AST 27 39  ALT 23 27  ALKPHOS 42 37*  PROT 7.3 6.5  ALBUMIN 4.9 3.3*    Assessment and Plan:  Hematoma of right thigh --s/p drainage 1/29 --OP increased this morning, Hgb appears stable, OP thinner than previous, but still dark/old looking in nature.  All reassuring this is just draining hematoma. --Continue TID flushes with 5 cc NS. --Record output Q shift. --Dressing changes QD or PRN if soiled.   Call IR APP or on call IR MD if difficulty flushing or sudden change in drain output.   --Outpatient orders placed. IR scheduler will contact patient with date/time of appointment.  Discussed possibility of drain being removed by his surgeon at outpatient follow up if exam and output are reassuring.   --Patient will need to flush drain QD with 5 cc NS, record output QD, dressing changes every 2-3 days or earlier if soiled.  --Flush material sent to [place] [facility]. Would be ideal for him to receive this prior to d/c. If it needs to be sent to outside pharmacy, please contact IR.   Electronically Signed: Pasty Spillers, PA 09/09/2022, 3:26 PM   I spent a total of 15 Minutes at the the patient's bedside AND on the patient's hospital floor or unit, greater than 50% of which was counseling/coordinating care for drain function and drain care

## 2022-09-10 LAB — HEMOGLOBIN AND HEMATOCRIT, BLOOD
HCT: 34.4 % — ABNORMAL LOW (ref 39.0–52.0)
Hemoglobin: 11 g/dL — ABNORMAL LOW (ref 13.0–17.0)

## 2022-09-10 MED ORDER — OXYCODONE HCL 5 MG PO TABS: 5.0000 mg | ORAL_TABLET | ORAL | 0 refills | Status: AC | PRN

## 2022-09-10 MED ORDER — METHOCARBAMOL 500 MG PO TABS: 500.0000 mg | ORAL_TABLET | Freq: Three times a day (TID) | ORAL | 0 refills | Status: AC | PRN

## 2022-09-10 MED ORDER — APIXABAN 2.5 MG PO TABS: 2.5000 mg | ORAL_TABLET | Freq: Two times a day (BID) | ORAL | Status: DC

## 2022-09-10 NOTE — Discharge Instructions (Signed)
Orthopedic Discharge Instructions  Diet: As you were doing prior to hospitalization   Shower:  May shower but keep the dressing dry, use an occlusive plastic wrap.  Dressing:  Keep dressing intact until follow up. Empty drain output at least twice a day. Document output.  Activity:  Increase activity slowly as tolerated, but follow the weight bearing instructions below.  The rules on driving is that you can not be taking narcotics while you drive, and you must feel in control of the vehicle.    Weight Bearing:   Weight bearing as tolerated.  Blood clot prevention (DVT Prophylaxis): Resume eliquis 2.'5mg'$  twice daily Saturday and '5mg'$  twice daily and Monday.   This will help prevent a blood clot. Signs of a pulmonary embolus (blood clot in the lungs) include sudden short of breath, feeling lightheaded or dizzy, chest pain with a deep breath, rapid pulse rapid breathing. Signs of a blood clot in your arms or legs include new unexplained swelling and cramping, warm, red or darkened skin around the painful area. Please call the office or 911 right away if these signs or symptoms develop. To prevent constipation: you may use a stool softener such as -  Colace (over the counter) 100 mg by mouth twice a day  Drink plenty of fluids (prune juice may be helpful) and high fiber foods Miralax (over the counter) for constipation as needed.    Itching:  If you experience itching with your medications, try taking only a single pain pill, or even half a pain pill at a time.  You may take up to 10 pain pills per day, and you can also use benadryl over the counter for itching or also to help with sleep.   Precautions:  If you experience chest pain or shortness of breath - call 911 immediately for transfer to the hospital emergency department!!   Call office [phone]) for the following: Temperature greater than 101F Persistent nausea and vomiting Severe uncontrolled pain Redness, tenderness, or signs of  infection (pain, swelling, redness, odor or green/yellow discharge around the site) Difficulty breathing, headache or visual disturbances Hives Persistent dizziness or light-headedness Extreme fatigue Any other questions or concerns you may have after discharge  In an emergency, call 911 or go to an Emergency Department at a nearby hospital  Follow- Up Appointment:  Please call for an appointment to be seen Tuesday 2/6 in [facility] with your surgeon Dr. [name] - [phone] Address: [address] [place], [place], [place] [address]

## 2022-09-10 NOTE — Progress Notes (Signed)
Discharge instructions were given to the patient. All questions answered.

## 2022-09-10 NOTE — Care Management Important Message (Signed)
Important Message  Patient Details  Name: Norman Hudson. MRN: [id number] Date of Birth: October 02, 1950   Medicare Important Message Given:  Yes     Hannah Beat 09/10/2022, 1:52 PM

## 2022-09-10 NOTE — Progress Notes (Signed)
     Subjective: Patient looks very good today. Minimal swelling.. Moving around well. Plan to resume eliquis tomorrow and follow up clinic Tuesday. Patinet feels comfortable with plan to dc home today.   Objective:   VITALS:   Vitals:   09/09/22 1008 09/09/22 1012 09/09/22 1435 09/09/22 2027  BP: 111/67 111/67 122/70 108/75  Pulse: 72 72 71 70  Resp: '14  15 15  '$ Temp:   98 F (36.7 C) 98.2 F (36.8 C)  TempSrc:   Oral Oral  SpO2:   99% 100%  Weight:      Height:        RLE minimal right thigh swelling improved  Diffusely tender about the thigh in the  Sens DPN, SPN, TN intact  Motor EHL, ext, flex 5/5  DP 2+, PT 2+, No significant edema  Drain with small amount of dark sanguinous fluid.   Lab Results  Component Value Date   WBC 9.8 09/07/2022   HGB 10.0 (L) 09/09/2022   HCT 30.5 (L) 09/09/2022   MCV 96.6 09/07/2022   PLT 302 09/07/2022   BMET    Component Value Date/Time   NA 134 (L) 09/07/2022 0458   NA 140 09/04/2021 1448   K 4.2 09/07/2022 0458   CL 103 09/07/2022 0458   CO2 25 09/07/2022 0458   GLUCOSE 140 (H) 09/07/2022 0458   BUN 14 09/07/2022 0458   BUN 17 09/04/2021 1448   CREATININE 1.00 09/07/2022 0458   CALCIUM 8.7 (L) 09/07/2022 0458   EGFR 89 09/04/2021 1448   GFRNONAA >60 09/07/2022 0458   Assessment/Plan:     Principal Problem:   Hematoma of right hip  Status post right hip revision 08/23/2022 with significant postop swelling concerning for hematoma.  Gradual improvement since admission. S/p aspiration and drain placement 1/29.  Will continue to hold Eliquis per cards team. Recs appreciated.   Currently with normal heart rate in sinus rhythm.  Anticipate discharge home 2/2.  Follow-up in clinic with myself 2/6 for wound check and drain removal.    Plan to resume Eliquis 2/3 per cardiology.   Awab Abebe A Nyzir Dubois 09/10/2022, 6:59 AM   [name], MD  Contact information:   [phone] 7am-5pm epic message Dr. Zachery Dakins,  or call office for patient follow up: [phone] After hours and holidays please check [email or web address] for group call information for [facility]

## 2022-09-10 NOTE — Plan of Care (Signed)
  Problem: Education: Goal: Knowledge of the prescribed therapeutic regimen will improve Outcome: Progressing Goal: Understanding of discharge needs will improve Outcome: Progressing   Problem: Activity: Goal: Ability to avoid complications of mobility impairment will improve Outcome: Progressing Goal: Ability to tolerate increased activity will improve Outcome: Progressing   Problem: Pain Management: Goal: Pain level will decrease with appropriate interventions Outcome: Progressing   Problem: Pain Managment: Goal: General experience of comfort will improve Outcome: Progressing   Problem: Safety: Goal: Ability to remain free from injury will improve Outcome: Progressing

## 2022-09-10 NOTE — Discharge Summary (Signed)
Physician Discharge Summary  Patient ID: Norman Hudson. MRN: [id number] DOB/AGE: 73-Jul-1952 73 y.o.  Admit date: 09/03/2022 Discharge date: 09/10/2022  Admission Diagnoses:  Hematoma of right hip  Discharge Diagnoses:  Principal Problem:   Hematoma of right hip   Past Medical History:  Diagnosis Date   Acne    Anxiety    takes Xanax daily as needed   Arthritis    Atrial flutter, paroxysmal ([place])    History of kidney stones    Hyperlipidemia    takes Pravastatin daily   Hypertension    takes Amlodipine and Valsartan-HCTZ daily   Hypothyroidism    takes Synthroid daily   Inguinal hernia    left side   Internal hemorrhoids    Joint pain    Kidney stones    Pre-diabetes    Rosacea    TIA (transient ischemic attack) 06/2019    Surgeries:  none  Consultants (if any): cardiology  Discharged Condition: Improved  Hospital Course: Boyce Keltner Rishi Vicario. is an 72 y.o. male who was admitted 09/03/2022 with a diagnosis of Hematoma of right hip after undergoing revision THA 09/02/22. His eliquis was held and IR was consulted for aspiration and drain placement. Swelling and pain markedly improved during admission. HR and BP were well controlled. He received 2 blood transfusions and hgb has stabilized. Deemed safe for discharge home 09/10/22.   He was given perioperative antibiotics:  Anti-infectives (From admission, onward)    None     .  He benefited maximally from the hospital stay and there were no complications.    Recent vital signs:  Vitals:   09/09/22 1435 09/09/22 2027  BP: 122/70 108/75  Pulse: 71 70  Resp: 15 15  Temp: 98 F (36.7 C) 98.2 F (36.8 C)  SpO2: 99% 100%    Recent laboratory studies:  Lab Results  Component Value Date   HGB 10.0 (L) 09/09/2022   HGB 9.9 (L) 09/08/2022   HGB 9.8 (L) 09/07/2022   Lab Results  Component Value Date   WBC 9.8 09/07/2022   PLT 302 09/07/2022   Lab Results  Component Value Date   INR 1.2  09/06/2022   Lab Results  Component Value Date   NA 134 (L) 09/07/2022   K 4.2 09/07/2022   CL 103 09/07/2022   CO2 25 09/07/2022   BUN 14 09/07/2022   CREATININE 1.00 09/07/2022   GLUCOSE 140 (H) 09/07/2022    Discharge Medications:   Allergies as of 09/10/2022       Reactions   Bactrim [sulfamethoxazole-trimethoprim] Hives, Itching   "threw me for a loop, hives, itching, extreme fatigue"        Medication List     TAKE these medications    acetaminophen 325 MG tablet Commonly known as: TYLENOL Take 650 mg by mouth every 6 (six) hours as needed for moderate pain.   apixaban 2.5 MG Tabs tablet Commonly known as: Eliquis Take 1 tablet (2.5 mg total) by mouth 2 (two) times daily. Start taking on: September 11, 2022 What changed:  medication strength how much to take   cholecalciferol 25 MCG (1000 UNIT) tablet Commonly known as: VITAMIN D3 Take 1,000 Units by mouth daily.   clobetasol ointment 0.05 % Commonly known as: TEMOVATE Apply 1 Application topically 2 (two) times daily as needed (itching).   clonazePAM 0.5 MG tablet Commonly known as: KLONOPIN Take 0.5-1 tablets (0.25-0.5 mg total) by mouth at bedtime as needed for anxiety. What changed:  Physician Discharge Summary  Patient ID: Norman Hudson. MRN: [id number] DOB/AGE: 73-Jul-1952 72 y.o.  Admit date: 09/03/2022 Discharge date: 09/10/2022  Admission Diagnoses:  Hematoma of right hip  Discharge Diagnoses:  Principal Problem:   Hematoma of right hip   Past Medical History:  Diagnosis Date   Acne    Anxiety    takes Xanax daily as needed   Arthritis    Atrial flutter, paroxysmal ([place])    History of kidney stones    Hyperlipidemia    takes Pravastatin daily   Hypertension    takes Amlodipine and Valsartan-HCTZ daily   Hypothyroidism    takes Synthroid daily   Inguinal hernia    left side   Internal hemorrhoids    Joint pain    Kidney stones    Pre-diabetes    Rosacea    TIA (transient ischemic attack) 06/2019    Surgeries:  none  Consultants (if any): cardiology  Discharged Condition: Improved  Hospital Course: Boyce Keltner Rishi Vicario. is an 72 y.o. male who was admitted 09/03/2022 with a diagnosis of Hematoma of right hip after undergoing revision THA 09/02/22. His eliquis was held and IR was consulted for aspiration and drain placement. Swelling and pain markedly improved during admission. HR and BP were well controlled. He received 2 blood transfusions and hgb has stabilized. Deemed safe for discharge home 09/10/22.   He was given perioperative antibiotics:  Anti-infectives (From admission, onward)    None     .  He benefited maximally from the hospital stay and there were no complications.    Recent vital signs:  Vitals:   09/09/22 1435 09/09/22 2027  BP: 122/70 108/75  Pulse: 71 70  Resp: 15 15  Temp: 98 F (36.7 C) 98.2 F (36.8 C)  SpO2: 99% 100%    Recent laboratory studies:  Lab Results  Component Value Date   HGB 10.0 (L) 09/09/2022   HGB 9.9 (L) 09/08/2022   HGB 9.8 (L) 09/07/2022   Lab Results  Component Value Date   WBC 9.8 09/07/2022   PLT 302 09/07/2022   Lab Results  Component Value Date   INR 1.2  09/06/2022   Lab Results  Component Value Date   NA 134 (L) 09/07/2022   K 4.2 09/07/2022   CL 103 09/07/2022   CO2 25 09/07/2022   BUN 14 09/07/2022   CREATININE 1.00 09/07/2022   GLUCOSE 140 (H) 09/07/2022    Discharge Medications:   Allergies as of 09/10/2022       Reactions   Bactrim [sulfamethoxazole-trimethoprim] Hives, Itching   "threw me for a loop, hives, itching, extreme fatigue"        Medication List     TAKE these medications    acetaminophen 325 MG tablet Commonly known as: TYLENOL Take 650 mg by mouth every 6 (six) hours as needed for moderate pain.   apixaban 2.5 MG Tabs tablet Commonly known as: Eliquis Take 1 tablet (2.5 mg total) by mouth 2 (two) times daily. Start taking on: September 11, 2022 What changed:  medication strength how much to take   cholecalciferol 25 MCG (1000 UNIT) tablet Commonly known as: VITAMIN D3 Take 1,000 Units by mouth daily.   clobetasol ointment 0.05 % Commonly known as: TEMOVATE Apply 1 Application topically 2 (two) times daily as needed (itching).   clonazePAM 0.5 MG tablet Commonly known as: KLONOPIN Take 0.5-1 tablets (0.25-0.5 mg total) by mouth at bedtime as needed for anxiety. What changed:  Physician Discharge Summary  Patient ID: Norman Hudson. MRN: [id number] DOB/AGE: 73-Jul-1952 72 y.o.  Admit date: 09/03/2022 Discharge date: 09/10/2022  Admission Diagnoses:  Hematoma of right hip  Discharge Diagnoses:  Principal Problem:   Hematoma of right hip   Past Medical History:  Diagnosis Date   Acne    Anxiety    takes Xanax daily as needed   Arthritis    Atrial flutter, paroxysmal ([place])    History of kidney stones    Hyperlipidemia    takes Pravastatin daily   Hypertension    takes Amlodipine and Valsartan-HCTZ daily   Hypothyroidism    takes Synthroid daily   Inguinal hernia    left side   Internal hemorrhoids    Joint pain    Kidney stones    Pre-diabetes    Rosacea    TIA (transient ischemic attack) 06/2019    Surgeries:  none  Consultants (if any): cardiology  Discharged Condition: Improved  Hospital Course: Boyce Keltner Rishi Vicario. is an 72 y.o. male who was admitted 09/03/2022 with a diagnosis of Hematoma of right hip after undergoing revision THA 09/02/22. His eliquis was held and IR was consulted for aspiration and drain placement. Swelling and pain markedly improved during admission. HR and BP were well controlled. He received 2 blood transfusions and hgb has stabilized. Deemed safe for discharge home 09/10/22.   He was given perioperative antibiotics:  Anti-infectives (From admission, onward)    None     .  He benefited maximally from the hospital stay and there were no complications.    Recent vital signs:  Vitals:   09/09/22 1435 09/09/22 2027  BP: 122/70 108/75  Pulse: 71 70  Resp: 15 15  Temp: 98 F (36.7 C) 98.2 F (36.8 C)  SpO2: 99% 100%    Recent laboratory studies:  Lab Results  Component Value Date   HGB 10.0 (L) 09/09/2022   HGB 9.9 (L) 09/08/2022   HGB 9.8 (L) 09/07/2022   Lab Results  Component Value Date   WBC 9.8 09/07/2022   PLT 302 09/07/2022   Lab Results  Component Value Date   INR 1.2  09/06/2022   Lab Results  Component Value Date   NA 134 (L) 09/07/2022   K 4.2 09/07/2022   CL 103 09/07/2022   CO2 25 09/07/2022   BUN 14 09/07/2022   CREATININE 1.00 09/07/2022   GLUCOSE 140 (H) 09/07/2022    Discharge Medications:   Allergies as of 09/10/2022       Reactions   Bactrim [sulfamethoxazole-trimethoprim] Hives, Itching   "threw me for a loop, hives, itching, extreme fatigue"        Medication List     TAKE these medications    acetaminophen 325 MG tablet Commonly known as: TYLENOL Take 650 mg by mouth every 6 (six) hours as needed for moderate pain.   apixaban 2.5 MG Tabs tablet Commonly known as: Eliquis Take 1 tablet (2.5 mg total) by mouth 2 (two) times daily. Start taking on: September 11, 2022 What changed:  medication strength how much to take   cholecalciferol 25 MCG (1000 UNIT) tablet Commonly known as: VITAMIN D3 Take 1,000 Units by mouth daily.   clobetasol ointment 0.05 % Commonly known as: TEMOVATE Apply 1 Application topically 2 (two) times daily as needed (itching).   clonazePAM 0.5 MG tablet Commonly known as: KLONOPIN Take 0.5-1 tablets (0.25-0.5 mg total) by mouth at bedtime as needed for anxiety. What changed:  was return of old dark blood. Guidewire inserted easily followed by tract dilatation insert a 14 French drain. Drain catheter position confirmed with ultrasound. Syringe aspiration yielded 200 cc old dark blood. No purulent component. Sample was not sent for culture. Catheter secured with a silk suture and connected to external suction bulb. Sterile dressing applied. No immediate complication. Patient tolerated the procedure well. IMPRESSION: Successful ultrasound 14 French drain placement within the right lateral thigh hematoma. Electronically Signed   By: Jerilynn Mages.  Shick M.D.   On: 09/06/2022 16:16   VAS [place] LOWER EXTREMITY VENOUS (DVT) (7a-7p)  Result Date: 09/05/2022  Lower Venous DVT Study Patient Name:  Kara Mierzejewski.  Date of Exam:   09/03/2022 Medical Rec #: [id number]                    Accession #:    [id number] Date of Birth: [birth year]                    Patient Gender: M Patient Age:   68 years Exam Location:  [facility] Procedure:      VAS [place] LOWER  EXTREMITY VENOUS (DVT) Referring Phys: Lajean Saver --------------------------------------------------------------------------------  Indications: Swelling, and Edema.  Comparison Study: no prior Performing Technologist: Archie Patten RVS  Examination Guidelines: A complete evaluation includes B-mode imaging, spectral Doppler, color Doppler, and power Doppler as needed of all accessible portions of each vessel. Bilateral testing is considered an integral part of a complete examination. Limited examinations for reoccurring indications may be performed as noted. The reflux portion of the exam is performed with the patient in reverse Trendelenburg.  +---------+---------------+---------+-----------+----------+--------------+ RIGHT    CompressibilityPhasicitySpontaneityPropertiesThrombus Aging +---------+---------------+---------+-----------+----------+--------------+ CFV      Full           Yes      Yes                                 +---------+---------------+---------+-----------+----------+--------------+ SFJ      Full                                                        +---------+---------------+---------+-----------+----------+--------------+ FV Prox  Full                                                        +---------+---------------+---------+-----------+----------+--------------+ FV Mid   Full                                                        +---------+---------------+---------+-----------+----------+--------------+ FV DistalFull           Yes      Yes                                 +---------+---------------+---------+-----------+----------+--------------+ PFV      Full                                                        +---------+---------------+---------+-----------+----------+--------------+  was return of old dark blood. Guidewire inserted easily followed by tract dilatation insert a 14 French drain. Drain catheter position confirmed with ultrasound. Syringe aspiration yielded 200 cc old dark blood. No purulent component. Sample was not sent for culture. Catheter secured with a silk suture and connected to external suction bulb. Sterile dressing applied. No immediate complication. Patient tolerated the procedure well. IMPRESSION: Successful ultrasound 14 French drain placement within the right lateral thigh hematoma. Electronically Signed   By: Jerilynn Mages.  Shick M.D.   On: 09/06/2022 16:16   VAS [place] LOWER EXTREMITY VENOUS (DVT) (7a-7p)  Result Date: 09/05/2022  Lower Venous DVT Study Patient Name:  Kara Mierzejewski.  Date of Exam:   09/03/2022 Medical Rec #: [id number]                    Accession #:    [id number] Date of Birth: [birth year]                    Patient Gender: M Patient Age:   68 years Exam Location:  [facility] Procedure:      VAS [place] LOWER  EXTREMITY VENOUS (DVT) Referring Phys: Lajean Saver --------------------------------------------------------------------------------  Indications: Swelling, and Edema.  Comparison Study: no prior Performing Technologist: Archie Patten RVS  Examination Guidelines: A complete evaluation includes B-mode imaging, spectral Doppler, color Doppler, and power Doppler as needed of all accessible portions of each vessel. Bilateral testing is considered an integral part of a complete examination. Limited examinations for reoccurring indications may be performed as noted. The reflux portion of the exam is performed with the patient in reverse Trendelenburg.  +---------+---------------+---------+-----------+----------+--------------+ RIGHT    CompressibilityPhasicitySpontaneityPropertiesThrombus Aging +---------+---------------+---------+-----------+----------+--------------+ CFV      Full           Yes      Yes                                 +---------+---------------+---------+-----------+----------+--------------+ SFJ      Full                                                        +---------+---------------+---------+-----------+----------+--------------+ FV Prox  Full                                                        +---------+---------------+---------+-----------+----------+--------------+ FV Mid   Full                                                        +---------+---------------+---------+-----------+----------+--------------+ FV DistalFull           Yes      Yes                                 +---------+---------------+---------+-----------+----------+--------------+ PFV      Full                                                        +---------+---------------+---------+-----------+----------+--------------+  Physician Discharge Summary  Patient ID: Norman Hudson. MRN: [id number] DOB/AGE: 73-Jul-1952 72 y.o.  Admit date: 09/03/2022 Discharge date: 09/10/2022  Admission Diagnoses:  Hematoma of right hip  Discharge Diagnoses:  Principal Problem:   Hematoma of right hip   Past Medical History:  Diagnosis Date   Acne    Anxiety    takes Xanax daily as needed   Arthritis    Atrial flutter, paroxysmal ([place])    History of kidney stones    Hyperlipidemia    takes Pravastatin daily   Hypertension    takes Amlodipine and Valsartan-HCTZ daily   Hypothyroidism    takes Synthroid daily   Inguinal hernia    left side   Internal hemorrhoids    Joint pain    Kidney stones    Pre-diabetes    Rosacea    TIA (transient ischemic attack) 06/2019    Surgeries:  none  Consultants (if any): cardiology  Discharged Condition: Improved  Hospital Course: Boyce Keltner Rishi Vicario. is an 72 y.o. male who was admitted 09/03/2022 with a diagnosis of Hematoma of right hip after undergoing revision THA 09/02/22. His eliquis was held and IR was consulted for aspiration and drain placement. Swelling and pain markedly improved during admission. HR and BP were well controlled. He received 2 blood transfusions and hgb has stabilized. Deemed safe for discharge home 09/10/22.   He was given perioperative antibiotics:  Anti-infectives (From admission, onward)    None     .  He benefited maximally from the hospital stay and there were no complications.    Recent vital signs:  Vitals:   09/09/22 1435 09/09/22 2027  BP: 122/70 108/75  Pulse: 71 70  Resp: 15 15  Temp: 98 F (36.7 C) 98.2 F (36.8 C)  SpO2: 99% 100%    Recent laboratory studies:  Lab Results  Component Value Date   HGB 10.0 (L) 09/09/2022   HGB 9.9 (L) 09/08/2022   HGB 9.8 (L) 09/07/2022   Lab Results  Component Value Date   WBC 9.8 09/07/2022   PLT 302 09/07/2022   Lab Results  Component Value Date   INR 1.2  09/06/2022   Lab Results  Component Value Date   NA 134 (L) 09/07/2022   K 4.2 09/07/2022   CL 103 09/07/2022   CO2 25 09/07/2022   BUN 14 09/07/2022   CREATININE 1.00 09/07/2022   GLUCOSE 140 (H) 09/07/2022    Discharge Medications:   Allergies as of 09/10/2022       Reactions   Bactrim [sulfamethoxazole-trimethoprim] Hives, Itching   "threw me for a loop, hives, itching, extreme fatigue"        Medication List     TAKE these medications    acetaminophen 325 MG tablet Commonly known as: TYLENOL Take 650 mg by mouth every 6 (six) hours as needed for moderate pain.   apixaban 2.5 MG Tabs tablet Commonly known as: Eliquis Take 1 tablet (2.5 mg total) by mouth 2 (two) times daily. Start taking on: September 11, 2022 What changed:  medication strength how much to take   cholecalciferol 25 MCG (1000 UNIT) tablet Commonly known as: VITAMIN D3 Take 1,000 Units by mouth daily.   clobetasol ointment 0.05 % Commonly known as: TEMOVATE Apply 1 Application topically 2 (two) times daily as needed (itching).   clonazePAM 0.5 MG tablet Commonly known as: KLONOPIN Take 0.5-1 tablets (0.25-0.5 mg total) by mouth at bedtime as needed for anxiety. What changed:  was return of old dark blood. Guidewire inserted easily followed by tract dilatation insert a 14 French drain. Drain catheter position confirmed with ultrasound. Syringe aspiration yielded 200 cc old dark blood. No purulent component. Sample was not sent for culture. Catheter secured with a silk suture and connected to external suction bulb. Sterile dressing applied. No immediate complication. Patient tolerated the procedure well. IMPRESSION: Successful ultrasound 14 French drain placement within the right lateral thigh hematoma. Electronically Signed   By: Jerilynn Mages.  Shick M.D.   On: 09/06/2022 16:16   VAS [place] LOWER EXTREMITY VENOUS (DVT) (7a-7p)  Result Date: 09/05/2022  Lower Venous DVT Study Patient Name:  Kara Mierzejewski.  Date of Exam:   09/03/2022 Medical Rec #: [id number]                    Accession #:    [id number] Date of Birth: [birth year]                    Patient Gender: M Patient Age:   68 years Exam Location:  [facility] Procedure:      VAS [place] LOWER  EXTREMITY VENOUS (DVT) Referring Phys: Lajean Saver --------------------------------------------------------------------------------  Indications: Swelling, and Edema.  Comparison Study: no prior Performing Technologist: Archie Patten RVS  Examination Guidelines: A complete evaluation includes B-mode imaging, spectral Doppler, color Doppler, and power Doppler as needed of all accessible portions of each vessel. Bilateral testing is considered an integral part of a complete examination. Limited examinations for reoccurring indications may be performed as noted. The reflux portion of the exam is performed with the patient in reverse Trendelenburg.  +---------+---------------+---------+-----------+----------+--------------+ RIGHT    CompressibilityPhasicitySpontaneityPropertiesThrombus Aging +---------+---------------+---------+-----------+----------+--------------+ CFV      Full           Yes      Yes                                 +---------+---------------+---------+-----------+----------+--------------+ SFJ      Full                                                        +---------+---------------+---------+-----------+----------+--------------+ FV Prox  Full                                                        +---------+---------------+---------+-----------+----------+--------------+ FV Mid   Full                                                        +---------+---------------+---------+-----------+----------+--------------+ FV DistalFull           Yes      Yes                                 +---------+---------------+---------+-----------+----------+--------------+ PFV      Full                                                        +---------+---------------+---------+-----------+----------+--------------+  was return of old dark blood. Guidewire inserted easily followed by tract dilatation insert a 14 French drain. Drain catheter position confirmed with ultrasound. Syringe aspiration yielded 200 cc old dark blood. No purulent component. Sample was not sent for culture. Catheter secured with a silk suture and connected to external suction bulb. Sterile dressing applied. No immediate complication. Patient tolerated the procedure well. IMPRESSION: Successful ultrasound 14 French drain placement within the right lateral thigh hematoma. Electronically Signed   By: Jerilynn Mages.  Shick M.D.   On: 09/06/2022 16:16   VAS [place] LOWER EXTREMITY VENOUS (DVT) (7a-7p)  Result Date: 09/05/2022  Lower Venous DVT Study Patient Name:  Kara Mierzejewski.  Date of Exam:   09/03/2022 Medical Rec #: [id number]                    Accession #:    [id number] Date of Birth: [birth year]                    Patient Gender: M Patient Age:   68 years Exam Location:  [facility] Procedure:      VAS [place] LOWER  EXTREMITY VENOUS (DVT) Referring Phys: Lajean Saver --------------------------------------------------------------------------------  Indications: Swelling, and Edema.  Comparison Study: no prior Performing Technologist: Archie Patten RVS  Examination Guidelines: A complete evaluation includes B-mode imaging, spectral Doppler, color Doppler, and power Doppler as needed of all accessible portions of each vessel. Bilateral testing is considered an integral part of a complete examination. Limited examinations for reoccurring indications may be performed as noted. The reflux portion of the exam is performed with the patient in reverse Trendelenburg.  +---------+---------------+---------+-----------+----------+--------------+ RIGHT    CompressibilityPhasicitySpontaneityPropertiesThrombus Aging +---------+---------------+---------+-----------+----------+--------------+ CFV      Full           Yes      Yes                                 +---------+---------------+---------+-----------+----------+--------------+ SFJ      Full                                                        +---------+---------------+---------+-----------+----------+--------------+ FV Prox  Full                                                        +---------+---------------+---------+-----------+----------+--------------+ FV Mid   Full                                                        +---------+---------------+---------+-----------+----------+--------------+ FV DistalFull           Yes      Yes                                 +---------+---------------+---------+-----------+----------+--------------+ PFV      Full                                                        +---------+---------------+---------+-----------+----------+--------------+  Physician Discharge Summary  Patient ID: Norman Hudson. MRN: [id number] DOB/AGE: 73-Jul-1952 72 y.o.  Admit date: 09/03/2022 Discharge date: 09/10/2022  Admission Diagnoses:  Hematoma of right hip  Discharge Diagnoses:  Principal Problem:   Hematoma of right hip   Past Medical History:  Diagnosis Date   Acne    Anxiety    takes Xanax daily as needed   Arthritis    Atrial flutter, paroxysmal ([place])    History of kidney stones    Hyperlipidemia    takes Pravastatin daily   Hypertension    takes Amlodipine and Valsartan-HCTZ daily   Hypothyroidism    takes Synthroid daily   Inguinal hernia    left side   Internal hemorrhoids    Joint pain    Kidney stones    Pre-diabetes    Rosacea    TIA (transient ischemic attack) 06/2019    Surgeries:  none  Consultants (if any): cardiology  Discharged Condition: Improved  Hospital Course: Boyce Keltner Rishi Vicario. is an 72 y.o. male who was admitted 09/03/2022 with a diagnosis of Hematoma of right hip after undergoing revision THA 09/02/22. His eliquis was held and IR was consulted for aspiration and drain placement. Swelling and pain markedly improved during admission. HR and BP were well controlled. He received 2 blood transfusions and hgb has stabilized. Deemed safe for discharge home 09/10/22.   He was given perioperative antibiotics:  Anti-infectives (From admission, onward)    None     .  He benefited maximally from the hospital stay and there were no complications.    Recent vital signs:  Vitals:   09/09/22 1435 09/09/22 2027  BP: 122/70 108/75  Pulse: 71 70  Resp: 15 15  Temp: 98 F (36.7 C) 98.2 F (36.8 C)  SpO2: 99% 100%    Recent laboratory studies:  Lab Results  Component Value Date   HGB 10.0 (L) 09/09/2022   HGB 9.9 (L) 09/08/2022   HGB 9.8 (L) 09/07/2022   Lab Results  Component Value Date   WBC 9.8 09/07/2022   PLT 302 09/07/2022   Lab Results  Component Value Date   INR 1.2  09/06/2022   Lab Results  Component Value Date   NA 134 (L) 09/07/2022   K 4.2 09/07/2022   CL 103 09/07/2022   CO2 25 09/07/2022   BUN 14 09/07/2022   CREATININE 1.00 09/07/2022   GLUCOSE 140 (H) 09/07/2022    Discharge Medications:   Allergies as of 09/10/2022       Reactions   Bactrim [sulfamethoxazole-trimethoprim] Hives, Itching   "threw me for a loop, hives, itching, extreme fatigue"        Medication List     TAKE these medications    acetaminophen 325 MG tablet Commonly known as: TYLENOL Take 650 mg by mouth every 6 (six) hours as needed for moderate pain.   apixaban 2.5 MG Tabs tablet Commonly known as: Eliquis Take 1 tablet (2.5 mg total) by mouth 2 (two) times daily. Start taking on: September 11, 2022 What changed:  medication strength how much to take   cholecalciferol 25 MCG (1000 UNIT) tablet Commonly known as: VITAMIN D3 Take 1,000 Units by mouth daily.   clobetasol ointment 0.05 % Commonly known as: TEMOVATE Apply 1 Application topically 2 (two) times daily as needed (itching).   clonazePAM 0.5 MG tablet Commonly known as: KLONOPIN Take 0.5-1 tablets (0.25-0.5 mg total) by mouth at bedtime as needed for anxiety. What changed:  was return of old dark blood. Guidewire inserted easily followed by tract dilatation insert a 14 French drain. Drain catheter position confirmed with ultrasound. Syringe aspiration yielded 200 cc old dark blood. No purulent component. Sample was not sent for culture. Catheter secured with a silk suture and connected to external suction bulb. Sterile dressing applied. No immediate complication. Patient tolerated the procedure well. IMPRESSION: Successful ultrasound 14 French drain placement within the right lateral thigh hematoma. Electronically Signed   By: Jerilynn Mages.  Shick M.D.   On: 09/06/2022 16:16   VAS [place] LOWER EXTREMITY VENOUS (DVT) (7a-7p)  Result Date: 09/05/2022  Lower Venous DVT Study Patient Name:  Kara Mierzejewski.  Date of Exam:   09/03/2022 Medical Rec #: [id number]                    Accession #:    [id number] Date of Birth: [birth year]                    Patient Gender: M Patient Age:   68 years Exam Location:  [facility] Procedure:      VAS [place] LOWER  EXTREMITY VENOUS (DVT) Referring Phys: Lajean Saver --------------------------------------------------------------------------------  Indications: Swelling, and Edema.  Comparison Study: no prior Performing Technologist: Archie Patten RVS  Examination Guidelines: A complete evaluation includes B-mode imaging, spectral Doppler, color Doppler, and power Doppler as needed of all accessible portions of each vessel. Bilateral testing is considered an integral part of a complete examination. Limited examinations for reoccurring indications may be performed as noted. The reflux portion of the exam is performed with the patient in reverse Trendelenburg.  +---------+---------------+---------+-----------+----------+--------------+ RIGHT    CompressibilityPhasicitySpontaneityPropertiesThrombus Aging +---------+---------------+---------+-----------+----------+--------------+ CFV      Full           Yes      Yes                                 +---------+---------------+---------+-----------+----------+--------------+ SFJ      Full                                                        +---------+---------------+---------+-----------+----------+--------------+ FV Prox  Full                                                        +---------+---------------+---------+-----------+----------+--------------+ FV Mid   Full                                                        +---------+---------------+---------+-----------+----------+--------------+ FV DistalFull           Yes      Yes                                 +---------+---------------+---------+-----------+----------+--------------+ PFV      Full                                                        +---------+---------------+---------+-----------+----------+--------------+

## 2022-09-13 ENCOUNTER — Ambulatory Visit: Payer: Medicare HMO | Admitting: Cardiology

## 2022-09-13 ENCOUNTER — Encounter: Payer: Self-pay | Admitting: Cardiology

## 2022-09-13 VITALS — BP 129/66 | HR 65 | Ht 67.0 in | Wt 206.0 lb

## 2022-09-13 DIAGNOSIS — I10 Essential (primary) hypertension: Secondary | ICD-10-CM | POA: Diagnosis not present

## 2022-09-13 DIAGNOSIS — G459 Transient cerebral ischemic attack, unspecified: Secondary | ICD-10-CM

## 2022-09-13 DIAGNOSIS — I4892 Unspecified atrial flutter: Secondary | ICD-10-CM | POA: Diagnosis not present

## 2022-09-13 DIAGNOSIS — Z79899 Other long term (current) drug therapy: Secondary | ICD-10-CM

## 2022-09-13 DIAGNOSIS — G4733 Obstructive sleep apnea (adult) (pediatric): Secondary | ICD-10-CM

## 2022-09-13 DIAGNOSIS — Z87891 Personal history of nicotine dependence: Secondary | ICD-10-CM

## 2022-09-13 DIAGNOSIS — E7800 Pure hypercholesterolemia, unspecified: Secondary | ICD-10-CM

## 2022-09-13 DIAGNOSIS — Z7901 Long term (current) use of anticoagulants: Secondary | ICD-10-CM | POA: Diagnosis not present

## 2022-09-13 NOTE — Progress Notes (Signed)
Norman Hudson Norman Hudson. Date of Birth: 12/02/1950 MRN: [id number] Primary Care Provider:Timberlake, Meridee Score, MD Former Cardiology Providers: [employer], APRN, FNP-C Primary Cardiologist: Tessa Lerner, DO, [facility] (established care 02/21/2020)  Date: 09/13/22 Last Office Visit: 08/12/2022  Chief Complaint  Patient presents with   Hospitalization Follow-up   Paroxysmal atrial flutter   Long term (current) use of anticoagulants    HPI  Norman Pell. is a 72 y.o.  male whose past medical history and cardiovascular risk factors include: Paroxysmal atypical atrial flutter (04/2021) status post cardioversion, prediabetes, hypertension, hyperlipidemia, chronically low testosterone, hypothyroidism, former smoker with 15 pack year history, anxiety, osteoarthritis, history of TIA, advanced age.  Patient recently underwent right hip revision surgery with orthopedic team and had symptomatic postoperative atrial fibrillation/atypical atrial flutter with rapid ventricular rate.  He was cardioverted x 3 on 08/28/2022 which was unsuccessful.  He underwent direct-current cardioversion again on 08/29/2022 and converted to sinus rhythm.  He was placed on flecainide/metoprolol.  Unfortunately, after being discharged home he comes back in due to surgical complications (large right thigh hematoma).  He underwent blood transfusions and interventional radiology placed a JP drain.  He now presents for follow-up.  He is overall in good spirits and accompanied by his wife.  Denies anginal discomfort.  Has bilateral lower extremity swelling.  Has a JP drain present and surgical site is covered with dressing.  ALLERGIES: Allergies  Allergen Reactions   Bactrim [Sulfamethoxazole-Trimethoprim] Hives and Itching    "threw me for a loop, hives, itching, extreme fatigue"    MEDICATION LIST PRIOR TO VISIT: Current Outpatient Medications on File Prior to Visit  Medication Sig Dispense Refill    acetaminophen (TYLENOL) 325 MG tablet Take 650 mg by mouth every 6 (six) hours as needed for moderate pain.     apixaban (ELIQUIS) 2.5 MG TABS tablet Take 1 tablet (2.5 mg total) by mouth 2 (two) times daily.     cholecalciferol (VITAMIN D3) 25 MCG (1000 UT) tablet Take 1,000 Units by mouth daily.     clobetasol ointment (TEMOVATE) 0.05 % Apply 1 Application topically 2 (two) times daily as needed (itching).     clonazePAM (KLONOPIN) 0.5 MG tablet Take 0.5-1 tablets (0.25-0.5 mg total) by mouth at bedtime as needed for anxiety. (Patient taking differently: Take 0.5 mg by mouth at bedtime as needed for anxiety (sleep).) 90 tablet 1   flecainide (TAMBOCOR) 50 MG tablet Take 1 tablet (50 mg total) by mouth 2 (two) times daily. 60 tablet 1   hydrALAZINE (APRESOLINE) 25 MG tablet Take 1 tablet (25 mg total) by mouth 3 (three) times daily as needed. Take as needed for blood pressure greater than 150/90 90 tablet 3   hydrochlorothiazide (HYDRODIURIL) 25 MG tablet Take 1 tablet (25 mg total) by mouth every morning. 90 tablet 0   metFORMIN (GLUCOPHAGE) 500 MG tablet Take 500 mg by mouth daily with breakfast.     methocarbamol (ROBAXIN) 500 MG tablet Take 1 tablet (500 mg total) by mouth every 8 (eight) hours as needed for up to 10 days for muscle spasms. 30 tablet 0   metoprolol succinate (TOPROL-XL) 25 MG 24 hr tablet Take 1 tablet (25 mg total) by mouth every morning. 90 tablet 0   Multiple Vitamins-Minerals (MULTIVITAMIN WITH MINERALS) tablet Take 1 tablet by mouth daily.     Multiple Vitamins-Minerals (PRESERVISION AREDS 2 PO) Take 1 capsule by mouth daily.     olmesartan (BENICAR) 40 MG tablet Take 1 tablet (40  mg total) by mouth daily at 10 pm. 90 tablet 0   oxyCODONE (ROXICODONE) 5 MG immediate release tablet Take 1-3 tablets (5-15 mg total) by mouth every 4 (four) hours as needed for up to 7 days for severe pain or moderate pain. 40 tablet 0   pyridOXINE (VITAMIN B-6) 100 MG tablet Take 100 mg by mouth  daily.     rosuvastatin (CRESTOR) 20 MG tablet TAKE ONE TABLET AT BEDTIME. (Patient taking differently: Take 20 mg by mouth daily.) 90 tablet 2   sodium chloride flush (NS) 0.9 % SOLN 5 mLs by Intracatheter route daily. (Must discard remaining 5ml in syringe) 300 mL 0   spironolactone (ALDACTONE) 25 MG tablet Take 1 tablet (25 mg total) by mouth daily. 90 tablet 3   SYNTHROID 88 MCG tablet Take 88 mcg by mouth daily before breakfast.     valACYclovir (VALTREX) 1000 MG tablet Take 1,000 mg by mouth daily as needed (Herpes flare).     No current facility-administered medications on file prior to visit.    PAST MEDICAL HISTORY: Past Medical History:  Diagnosis Date   Acne    Anxiety    takes Xanax daily as needed   Arthritis    Atrial flutter, paroxysmal (HCC)    History of kidney stones    Hyperlipidemia    takes Pravastatin daily   Hypertension    takes Amlodipine and Valsartan-HCTZ daily   Hypothyroidism    takes Synthroid daily   Inguinal hernia    left side   Internal hemorrhoids    Joint pain    Kidney stones    Pre-diabetes    Rosacea    TIA (transient ischemic attack) 06/2019    PAST SURGICAL HISTORY: Past Surgical History:  Procedure Laterality Date   CARDIOVERSION N/A 08/28/2022   Procedure: CARDIOVERSION;  Surgeon: Yates Decamp, MD;  Location: [facility];  Service: Cardiovascular;  Laterality: N/A;   CARDIOVERSION N/A 08/29/2022   Procedure: CARDIOVERSION;  Surgeon: Yates Decamp, MD;  Location: [facility];  Service: Cardiovascular;  Laterality: N/A;   COLONOSCOPY     EXTRACORPOREAL SHOCK WAVE LITHOTRIPSY Right 12/27/2019   Procedure: EXTRACORPOREAL SHOCK WAVE LITHOTRIPSY (ESWL);  Surgeon: Malen Gauze, MD;  [facility];  Service: Urology;  Laterality: Right;   IR US GUIDE BX ASP/DRAIN  09/06/2022   LITHOTRIPSY  09/2015   LUMBAR LAMINECTOMY WITH COFLEX 1 LEVEL N/A 07/07/2015   Procedure: LUMBAR FOUR-FIVE LUMBAR LAMINECTOMY WITH COFLEX;  Surgeon:  Tia Alert, MD;  Location: [facility];  Service: Neurosurgery;  Laterality: N/A;   TONSILLECTOMY     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 HEAD AND LINER EXCHANGE. POSSIBLE CUP REVISION;  Surgeon: Joen Laura, MD;  Location: [facility];  Service: Orthopedics;  Laterality: Right;    FAMILY HISTORY: The patient's family history includes Alcoholism in his mother; Aneurysm in his sister; Atrial fibrillation in his sister; CVA in his father; Cancer in his maternal grandfather; Diabetes in his paternal grandmother; Hypertension in his father, paternal grandmother, and sister; Sleep disorder in his father.   SOCIAL HISTORY:  The patient  reports that he  quit smoking about 33 years ago. His smoking use included cigarettes. He has a 15.00 pack-year smoking history. He has never used smokeless tobacco. He reports that he does not drink alcohol and does not use drugs.  Review of Systems  Cardiovascular:  Negative for chest pain, claudication, dyspnea on exertion, irregular heartbeat, leg swelling, near-syncope, orthopnea, palpitations, paroxysmal nocturnal dyspnea and syncope.  Respiratory:  Negative for shortness of breath.   Hematologic/Lymphatic: Negative for bleeding problem.  Musculoskeletal:  Negative for muscle cramps and myalgias.  Neurological:  Negative for dizziness and light-headedness.    PHYSICAL EXAM:    09/13/2022    2:34 PM 09/10/2022   10:32 AM 09/10/2022    9:55 AM  Vitals with BMI  Height 5\' 7"     Weight 206 lbs    BMI 32.26    Systolic 129 116 98  Diastolic 66 67 64  Pulse 65      CONSTITUTIONAL:  Well-developed and well-nourished. No acute distress.  Walks with a cane SKIN: Skin is warm and dry. No rash noted. No cyanosis. No pallor. No jaundice HEAD: Normocephalic and atraumatic.  EYES: No scleral icterus MOUTH/THROAT: Moist oral membranes.  NECK: No JVD present. No thyromegaly noted. No carotid bruits  CHEST Normal respiratory effort. No intercostal retractions  LUNGS: Clear to auscultation bilaterally.  No stridor. No wheezes. No rales.  CARDIOVASCULAR: Regular rate and rhythm, positive S1-S2, no murmurs rubs or gallops appreciated. ABDOMINAL: Soft, nontender, nondistended, positive bowel sounds in all 4 quadrants.  No apparent ascites.  EXTREMITIES: Bilateral +1 pitting edema.  No ecchymosis noted in the right thigh.  Induration noted at the right iliotibial band.  The JP drain has very minimal serosanguineous/blood-tinged drainage.  Surgical scar not visualized due to dressing present. HEMATOLOGIC: No significant bruising NEUROLOGIC: Oriented to person, place, and time. Nonfocal. Normal muscle tone.  PSYCHIATRIC: Normal mood and affect. Normal behavior. Cooperative  CARDIAC DATABASE: Direct-current cardioversion 08/28/2022:  150 J x 2 followed by 200 J x 1 without success.  08/29/2022: 150 J x 1 converted to sinus rhythm  EKG: 09/13/2022: Sinus rhythm, 61 bpm, without underlying ischemia or injury pattern.  Echocardiogram: 08/12/2022:  Normal LV systolic function with visual EF 60-65%. Left ventricle cavity  is normal in size. Normal left ventricular wall thickness. Normal global  wall motion. Normal diastolic filling pattern, normal LAP.  Aortic valve sclerosis without stenosis.  Mild tricuspid regurgitation. No evidence of pulmonary hypertension.  Compared to 04/29/2021 otherwise no significant change   Stress Testing:  Exercise myoview stress test  04/23/2019: Patient exercised for a total of 6:13 min., achieving approximately 7 METs. Normal BP response. Exercise was  terminated due to fatigue/weakness and achieving THR. Normal myocardial perfusion. All segments of left ventricle demonstrated normal wall motion and thickening. Stress LV EF is hyperdynamic 75%. Low risk study.    Event Monitor for 30 days Start date 07/11/2019: There were no symptomatic triggered events. Predominant rhythm is normal sinus rhythm. Rare PACs and PVCs. No atrial fibrillation.   21 day event monitor 07/07-07/27/2020: NSR with occasional PAC/PVC. 1 patient triggered event without reported symptoms correlated with NSR with PAC and PVC. 2 auto detected events occurred for NSR with 5 beats of NSVT a t 173 bpm on day 1 at 1453 and NSR with 6 beats of NSVT on day 12 at 1651. Both were asymptomatic. 1 auto detected event on day 14 for Sinus tachycardia with frequent PAC's. No A fib was noted.  LABORATORY DATA:  Latest Ref Rng & Units 09/10/2022   11:04 AM 09/09/2022    6:17 AM 09/08/2022   12:48 AM  CBC  Hemoglobin 13.0 - 17.0 g/dL 16.1  09.6  9.9   Hematocrit 39.0 - 52.0 % 34.4  30.5  30.1        Latest Ref Rng & Units 09/07/2022    4:58 AM 09/03/2022    1:01 PM 09/03/2022   11:46 AM  CMP  Glucose 70 - 99 mg/dL 045  409  811   BUN 8 - 23 mg/dL 14  40  41   Creatinine 0.61 - 1.24 mg/dL 9.14  7.82  9.56   Sodium 135 - 145 mmol/L 134  136  133   Potassium 3.5 - 5.1 mmol/L 4.2  3.8  3.8   Chloride 98 - 111 mmol/L 103  100  100   CO2 22 - 32 mmol/L 25   23   Calcium 8.9 - 10.3 mg/dL 8.7   8.9   Total Protein 6.5 - 8.1 g/dL   6.5   Total Bilirubin 0.3 - 1.2 mg/dL   1.7   Alkaline Phos 38 - 126 U/L   37   AST 15 - 41 U/L   39   ALT 0 - 44 U/L   27     Lipid Panel     Component Value Date/Time   CHOL 139 07/12/2019 1026   TRIG 115 07/12/2019 1026   HDL 46 07/12/2019 1026   CHOLHDL 3.0 07/12/2019 1026   LDLCALC 72 07/12/2019 1026   LABVLDL 21 07/12/2019 1026    Lab Results  Component Value Date   HGBA1C 6.4 (H) 08/13/2022   HGBA1C 6.0 (H) 04/16/2021   HGBA1C 5.8 (H)  07/12/2016   No components found for: "NTPROBNP" Lab Results  Component Value Date   TSH 3.918 04/30/2021    Cardiac Panel (last 3 results) No results for input(s): "CKTOTAL", "CKMB", "TROPONINIHS", "RELINDX" in the last 72 hours.  External Labs: Collected: 05/14/2021 provided by PCP Hemoglobin 10.6 g/dL, hematocrit 21.3%. Sodium 140, potassium 4.4, chloride 105, bicarb 27, BUN 14, creatinine 0.87 AST 18, ALT 22, alkaline phosphatase 67  Name Result Date Reference Range  TSH   2021-11-11    TSH 0.98   0.34-4.50  FOBT FIT ([id number])   2021-09-16    Occult Blood, Fecal, IA Negative   Negative  Thyroid Diagnostic Cascade   2021-09-10    TSH 0.33   0.34-4.50  Hemoglobin A1c   2021-09-10    eAG 140      Hgb A1c 6.5   4.8-5.6  PSA   2021-09-10    PSA 2.21   0.01-4.00  Free T4   2021-09-10    FT4 0.89   0.61-1.12  Lipid Panel w/reflex   2021-09-10    LDL Chol Calc (NIH) 44   0-99  CHOL/HDL 2.6   2.0-4.0  Cholesterol 119   <200  HDLD 46   30-70  LDL Chol Calc (NIH) 44   0-99  NHDL 73   0-129  Triglyceride 176   0-199    IMPRESSION:    ICD-10-CM   1. Paroxysmal atrial flutter (HCC)  I48.92 EKG 12-Lead    2. Long term (current) use of anticoagulants  Z79.01     3. Long term current use of antiarrhythmic drug  Z79.899     4. Essential hypertension  I10     5. TIA (transient ischemic attack)  G45.9     6. Pure  hypercholesterolemia  E78.00     7. OSA on CPAP  G47.33     8. Former smoker  Z87.891        RECOMMENDATIONS: Norman Tu. is a 72 y.o. male whose past medical history and cardiovascular risk factors include: prediabetes, hypertension, hyperlipidemia, chronically low testosterone, hypothyroidism, former smoker with 15 pack year history, anxiety, osteoarthritis, history of TIA, advanced age.  Paroxysmal atrial flutter (HCC) Rate control: Metoprolol. Rhythm control: Flecainide Thromboembolic prophylaxis: Eliquis. Status post cardioversion  January 2024 as noted above. EKG today illustrates sinus rhythm. Currently on Eliquis 2.5 mg p.o. twice daily given the recent large right thigh hematoma due to postprocedural/surgical complication.  Given the risk of bleeding the shared decision is to continue 2.5 mg p.o. twice daily until he is evaluated by his orthopedic surgeon tomorrow.  If appropriate hemostasis is achieved and if he is clearly by his orthopedic surgeon he can resume full anticoagulation - with 5 mg p.o. twice daily.  Being on suboptimal anticoagulation due to risk of bleeding he is at risk of thromboembolic events given his recent cardioversions on January 20th/21st 2024.  Patient and wife are educated on being more cognizant of her symptoms of stroke and if present to go to the closest ER via EMS.  Long term (current) use of anticoagulants Does not endorse evidence of bleeding. CHA2DS2-VASc SCORE is 4 which correlates to 4.6% risk of stroke per year (TIA, age, HTN). Once his recovers from his hip surgery will discuss possible watchman implantation to minimize his risk of bleeding.  Long-term use of antiarrhythmic medications: Currently on flecainide. Medication profile discussed. If he remains on flecainide for long-term recommend GXT once he recovers from his hip surgery to rule out exercise-induced arrhythmias.  Essential hypertension Office blood pressures are well-controlled. Continue current medical therapy. Reemphasized importance of a low-salt diet  TIA (transient ischemic attack) Educated on the importance of secondary prevention.  Pure hypercholesterolemia Currently on Crestor.   He denies myalgia or other side effects. Currently managed by primary care provider.  OSA on CPAP Patient is compliant with his CPAP on a daily basis. Follows with Dr. Vickey Huger.  Patient does have bilateral lower extremity swelling likely secondary to blood transfusions and IV fluid hydration given his acute blood loss  during his recent hospitalizations.  He is currently on his oral antihypertensive medications/diuretics.  To minimize the risk of hypotension and AKI I have asked him to start wearing compression stockings to help mobilize the fluids.  I would like to see him back in 2 weeks to reevaluate his swelling and if still present we will consider low-dose loop diuretics.  I have also asked him to check his weights on a regular basis and to keep a log.  If he gains more than 1 pound over 24 hours or 3 pounds over the course of the week have asked him to call me for further guidance.  Recent hospitalization records, cardioversion notes, discharge summary, medications reconciled as part of today's visit.  Patient and his wife's questions and concerns addressed to their satisfaction.  FINAL MEDICATION LIST END OF ENCOUNTER: No orders of the defined types were placed in this encounter.    Current Outpatient Medications:    acetaminophen (TYLENOL) 325 MG tablet, Take 650 mg by mouth every 6 (six) hours as needed for moderate pain., Disp: , Rfl:    apixaban (ELIQUIS) 2.5 MG TABS tablet, Take 1 tablet (2.5 mg total) by mouth 2 (two) times daily., Disp: ,  Rfl:    cholecalciferol (VITAMIN D3) 25 MCG (1000 UT) tablet, Take 1,000 Units by mouth daily., Disp: , Rfl:    clobetasol ointment (TEMOVATE) 0.05 %, Apply 1 Application topically 2 (two) times daily as needed (itching)., Disp: , Rfl:    clonazePAM (KLONOPIN) 0.5 MG tablet, Take 0.5-1 tablets (0.25-0.5 mg total) by mouth at bedtime as needed for anxiety. (Patient taking differently: Take 0.5 mg by mouth at bedtime as needed for anxiety (sleep).), Disp: 90 tablet, Rfl: 1   flecainide (TAMBOCOR) 50 MG tablet, Take 1 tablet (50 mg total) by mouth 2 (two) times daily., Disp: 60 tablet, Rfl: 1   hydrALAZINE (APRESOLINE) 25 MG tablet, Take 1 tablet (25 mg total) by mouth 3 (three) times daily as needed. Take as needed for blood pressure greater than 150/90, Disp: 90  tablet, Rfl: 3   hydrochlorothiazide (HYDRODIURIL) 25 MG tablet, Take 1 tablet (25 mg total) by mouth every morning., Disp: 90 tablet, Rfl: 0   metFORMIN (GLUCOPHAGE) 500 MG tablet, Take 500 mg by mouth daily with breakfast., Disp: , Rfl:    methocarbamol (ROBAXIN) 500 MG tablet, Take 1 tablet (500 mg total) by mouth every 8 (eight) hours as needed for up to 10 days for muscle spasms., Disp: 30 tablet, Rfl: 0   metoprolol succinate (TOPROL-XL) 25 MG 24 hr tablet, Take 1 tablet (25 mg total) by mouth every morning., Disp: 90 tablet, Rfl: 0   Multiple Vitamins-Minerals (MULTIVITAMIN WITH MINERALS) tablet, Take 1 tablet by mouth daily., Disp: , Rfl:    Multiple Vitamins-Minerals (PRESERVISION AREDS 2 PO), Take 1 capsule by mouth daily., Disp: , Rfl:    olmesartan (BENICAR) 40 MG tablet, Take 1 tablet (40 mg total) by mouth daily at 10 pm., Disp: 90 tablet, Rfl: 0   oxyCODONE (ROXICODONE) 5 MG immediate release tablet, Take 1-3 tablets (5-15 mg total) by mouth every 4 (four) hours as needed for up to 7 days for severe pain or moderate pain., Disp: 40 tablet, Rfl: 0   pyridOXINE (VITAMIN B-6) 100 MG tablet, Take 100 mg by mouth daily., Disp: , Rfl:    rosuvastatin (CRESTOR) 20 MG tablet, TAKE ONE TABLET AT BEDTIME. (Patient taking differently: Take 20 mg by mouth daily.), Disp: 90 tablet, Rfl: 2   sodium chloride flush (NS) 0.9 % SOLN, 5 mLs by Intracatheter route daily. (Must discard remaining 5ml in syringe), Disp: 300 mL, Rfl: 0   spironolactone (ALDACTONE) 25 MG tablet, Take 1 tablet (25 mg total) by mouth daily., Disp: 90 tablet, Rfl: 3   SYNTHROID 88 MCG tablet, Take 88 mcg by mouth daily before breakfast., Disp: , Rfl:    valACYclovir (VALTREX) 1000 MG tablet, Take 1,000 mg by mouth daily as needed (Herpes flare)., Disp: , Rfl:   Orders Placed This Encounter  Procedures   EKG 12-Lead    --Continue cardiac medications as reconciled in final medication list. --Return in about 2 weeks (around  09/27/2022) for Follow up, A. fib. Or sooner if needed. --Continue follow-up with your primary care physician regarding the management of your other chronic comorbid conditions.  Patient's questions and concerns were addressed to his satisfaction. He voices understanding of the instructions provided during this encounter.   This note was created using a voice recognition software as a result there may be grammatical errors inadvertently enclosed that do not reflect the nature of this encounter. Every attempt is made to correct such errors.  Tessa Lerner, [place], [facility]  Pager: [phone] Office: [phone]

## 2022-09-14 DIAGNOSIS — E7800 Pure hypercholesterolemia, unspecified: Secondary | ICD-10-CM | POA: Diagnosis not present

## 2022-09-14 DIAGNOSIS — I4891 Unspecified atrial fibrillation: Secondary | ICD-10-CM | POA: Diagnosis not present

## 2022-09-14 DIAGNOSIS — D6869 Other thrombophilia: Secondary | ICD-10-CM | POA: Diagnosis not present

## 2022-09-14 DIAGNOSIS — E119 Type 2 diabetes mellitus without complications: Secondary | ICD-10-CM | POA: Diagnosis not present

## 2022-09-14 DIAGNOSIS — M1991 Primary osteoarthritis, unspecified site: Secondary | ICD-10-CM | POA: Diagnosis not present

## 2022-09-14 DIAGNOSIS — G8929 Other chronic pain: Secondary | ICD-10-CM | POA: Diagnosis not present

## 2022-09-14 DIAGNOSIS — Z1211 Encounter for screening for malignant neoplasm of colon: Secondary | ICD-10-CM | POA: Diagnosis not present

## 2022-09-14 DIAGNOSIS — I10 Essential (primary) hypertension: Secondary | ICD-10-CM | POA: Diagnosis not present

## 2022-09-14 DIAGNOSIS — I700 Atherosclerosis of aorta: Secondary | ICD-10-CM | POA: Diagnosis not present

## 2022-09-14 DIAGNOSIS — Z0000 Encounter for general adult medical examination without abnormal findings: Secondary | ICD-10-CM | POA: Diagnosis not present

## 2022-09-14 DIAGNOSIS — E039 Hypothyroidism, unspecified: Secondary | ICD-10-CM | POA: Diagnosis not present

## 2022-09-14 DIAGNOSIS — T84010D Broken internal right hip prosthesis, subsequent encounter: Secondary | ICD-10-CM | POA: Diagnosis not present

## 2022-09-21 DIAGNOSIS — T84010D Broken internal right hip prosthesis, subsequent encounter: Secondary | ICD-10-CM | POA: Diagnosis not present

## 2022-09-27 ENCOUNTER — Encounter: Payer: Self-pay | Admitting: Cardiology

## 2022-09-27 ENCOUNTER — Ambulatory Visit: Payer: Medicare HMO | Admitting: Cardiology

## 2022-09-27 VITALS — BP 148/80 | HR 66 | Resp 17 | Ht 67.0 in | Wt 204.8 lb

## 2022-09-27 DIAGNOSIS — I10 Essential (primary) hypertension: Secondary | ICD-10-CM

## 2022-09-27 DIAGNOSIS — E7800 Pure hypercholesterolemia, unspecified: Secondary | ICD-10-CM | POA: Diagnosis not present

## 2022-09-27 DIAGNOSIS — G459 Transient cerebral ischemic attack, unspecified: Secondary | ICD-10-CM | POA: Diagnosis not present

## 2022-09-27 DIAGNOSIS — Z7901 Long term (current) use of anticoagulants: Secondary | ICD-10-CM

## 2022-09-27 DIAGNOSIS — Z87891 Personal history of nicotine dependence: Secondary | ICD-10-CM

## 2022-09-27 DIAGNOSIS — Z79899 Other long term (current) drug therapy: Secondary | ICD-10-CM | POA: Diagnosis not present

## 2022-09-27 DIAGNOSIS — I480 Paroxysmal atrial fibrillation: Secondary | ICD-10-CM

## 2022-09-27 DIAGNOSIS — G4733 Obstructive sleep apnea (adult) (pediatric): Secondary | ICD-10-CM | POA: Diagnosis not present

## 2022-09-27 DIAGNOSIS — I4892 Unspecified atrial flutter: Secondary | ICD-10-CM | POA: Diagnosis not present

## 2022-09-27 NOTE — Progress Notes (Signed)
Norman Hudson. Date of Birth: Aug 17, 1950 MRN: [id number] Primary Care Provider:Timberlake, Meridee Score, MD Former Cardiology Providers: [place], APRN, FNP-C Primary Cardiologist: Tessa Lerner, DO, [facility] (established care 02/21/2020)  Date: 09/27/22 Last Office Visit: 09/13/2022  Chief Complaint  Patient presents with   Atrial Fibrillation   Follow-up    2 weeks    HPI  Norman Hudson. is a 72 y.o.  male whose past medical history and cardiovascular risk factors include: Paroxysmal fibrillation status post cardioversion, prediabetes, hypertension, hyperlipidemia, chronically low testosterone, hypothyroidism, former smoker with 15 pack year history, anxiety, osteoarthritis, history of TIA, advanced age.  Patient recently underwent right hip revision surgery with orthopedic team and had symptomatic postoperative atrial fibrillation/atypical atrial flutter with rapid ventricular rate.  He was cardioverted x 3 on 08/28/2022 which was unsuccessful.  He underwent direct-current cardioversion again on 08/29/2022 and converted to sinus rhythm.  He was placed on flecainide/metoprolol.  Postoperatively he also had a large right thigh hematoma requiring him to have blood transfusions and interventional radiology placed JP drains.  Since last office visit these drains have been removed sutures have been removed by her orthopedic surgeon.  He was initially started on Eliquis 2.5 mg p.o. twice daily a week later transitioned to 5 mg p.o. twice daily.  He denies anginal discomfort or heart failure symptoms.  He has lost 2 pounds since last office visit.  In addition, his leg swelling has improved significantly.  ALLERGIES: Allergies  Allergen Reactions   Bactrim [Sulfamethoxazole-Trimethoprim] Hives and Itching    "threw me for a loop, hives, itching, extreme fatigue"    MEDICATION LIST PRIOR TO VISIT: Current Outpatient Medications on File Prior to Visit  Medication Sig  Dispense Refill   acetaminophen (TYLENOL) 325 MG tablet Take 650 mg by mouth every 6 (six) hours as needed for moderate pain.     apixaban (ELIQUIS) 5 MG TABS tablet Take 5 mg by mouth 2 (two) times daily.     cholecalciferol (VITAMIN D3) 25 MCG (1000 UT) tablet Take 1,000 Units by mouth daily.     clobetasol ointment (TEMOVATE) 0.05 % Apply 1 Application topically 2 (two) times daily as needed (itching).     clonazePAM (KLONOPIN) 0.5 MG tablet Take 0.5-1 tablets (0.25-0.5 mg total) by mouth at bedtime as needed for anxiety. (Patient taking differently: Take 0.5 mg by mouth at bedtime as needed for anxiety (sleep).) 90 tablet 1   flecainide (TAMBOCOR) 50 MG tablet Take 1 tablet (50 mg total) by mouth 2 (two) times daily. 60 tablet 1   hydrALAZINE (APRESOLINE) 25 MG tablet Take 1 tablet (25 mg total) by mouth 3 (three) times daily as needed. Take as needed for blood pressure greater than 150/90 90 tablet 3   hydrochlorothiazide (HYDRODIURIL) 25 MG tablet Take 1 tablet (25 mg total) by mouth every morning. 90 tablet 0   metFORMIN (GLUCOPHAGE) 500 MG tablet Take 500 mg by mouth daily with breakfast.     metoprolol succinate (TOPROL-XL) 25 MG 24 hr tablet Take 1 tablet (25 mg total) by mouth every morning. 90 tablet 0   Multiple Vitamins-Minerals (MULTIVITAMIN WITH MINERALS) tablet Take 1 tablet by mouth daily.     Multiple Vitamins-Minerals (PRESERVISION AREDS 2 PO) Take 1 capsule by mouth daily.     olmesartan (BENICAR) 40 MG tablet Take 1 tablet (40 mg total) by mouth daily at 10 pm. 90 tablet 0   pyridOXINE (VITAMIN B-6) 100 MG tablet Take 100 mg by mouth  daily.     rosuvastatin (CRESTOR) 20 MG tablet TAKE ONE TABLET AT BEDTIME. (Patient taking differently: Take 20 mg by mouth daily.) 90 tablet 2   sodium chloride flush (NS) 0.9 % SOLN 5 mLs by Intracatheter route daily. (Must discard remaining 5ml in syringe) 300 mL 0   spironolactone (ALDACTONE) 25 MG tablet Take 1 tablet (25 mg total) by mouth  daily. 90 tablet 3   SYNTHROID 88 MCG tablet Take 88 mcg by mouth daily before breakfast.     valACYclovir (VALTREX) 1000 MG tablet Take 1,000 mg by mouth daily as needed (Herpes flare).     No current facility-administered medications on file prior to visit.    PAST MEDICAL HISTORY: Past Medical History:  Diagnosis Date   Acne    Anxiety    takes Xanax daily as needed   Arthritis    Atrial flutter, paroxysmal (HCC)    History of kidney stones    Hyperlipidemia    takes Pravastatin daily   Hypertension    takes Amlodipine and Valsartan-HCTZ daily   Hypothyroidism    takes Synthroid daily   Inguinal hernia    left side   Internal hemorrhoids    Joint pain    Kidney stones    Pre-diabetes    Rosacea    TIA (transient ischemic attack) 06/2019    PAST SURGICAL HISTORY: Past Surgical History:  Procedure Laterality Date   CARDIOVERSION N/A 08/28/2022   Procedure: CARDIOVERSION;  Surgeon: Yates Decamp, MD;  Location: [facility];  Service: Cardiovascular;  Laterality: N/A;   CARDIOVERSION N/A 08/29/2022   Procedure: CARDIOVERSION;  Surgeon: Yates Decamp, MD;  Location: [facility];  Service: Cardiovascular;  Laterality: N/A;   COLONOSCOPY     EXTRACORPOREAL SHOCK WAVE LITHOTRIPSY Right 12/27/2019   Procedure: EXTRACORPOREAL SHOCK WAVE LITHOTRIPSY (ESWL);  Surgeon: Malen Gauze, MD;  [facility];  Service: Urology;  Laterality: Right;   IR US GUIDE BX ASP/DRAIN  09/06/2022   LITHOTRIPSY  09/2015   LUMBAR LAMINECTOMY WITH COFLEX 1 LEVEL N/A 07/07/2015   Procedure: LUMBAR FOUR-FIVE LUMBAR LAMINECTOMY WITH COFLEX;  Surgeon: Tia Alert, MD;  Location: [facility];  Service: Neurosurgery;  Laterality: N/A;   TONSILLECTOMY     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   Procedure: TOTAL HIP ARTHROPLASTY;  Surgeon: Frederico Hamman, MD;  Location: [facility];  Service: Orthopedics;  Laterality: Left;   TOTAL HIP ARTHROPLASTY Right 04/24/2021   Procedure: TOTAL HIP  ARTHROPLASTY;  Surgeon: Frederico Hamman, MD;  Location: [facility];  Service: Orthopedics;  Laterality: Right;   TOTAL HIP REVISION Right 08/23/2022   Procedure: TOTAL HIP REVISION. HEAD AND LINER EXCHANGE. POSSIBLE CUP REVISION;  Surgeon: Joen Laura, MD;  Location: [facility];  Service: Orthopedics;  Laterality: Right;    FAMILY HISTORY: The patient's family history includes Alcoholism in his mother; Aneurysm in his sister; Atrial fibrillation in his sister; CVA in his father; Cancer in his maternal grandfather; Diabetes in his paternal grandmother; Hypertension in his father, paternal grandmother, and sister; Sleep disorder in his father.   SOCIAL HISTORY:  The patient  reports that he quit smoking about 33 years ago. His smoking use included cigarettes. He has a 15.00 pack-year smoking history. He has never used smokeless tobacco. He reports that he does not drink alcohol and does not use drugs.  Review of Systems  Cardiovascular:  Negative for chest pain, claudication, dyspnea on exertion, irregular heartbeat, leg swelling, near-syncope, orthopnea, palpitations, paroxysmal nocturnal  dyspnea and syncope.  Respiratory:  Negative for shortness of breath.   Hematologic/Lymphatic: Negative for bleeding problem.  Musculoskeletal:  Negative for muscle cramps and myalgias.  Neurological:  Negative for dizziness and light-headedness.    PHYSICAL EXAM:    09/27/2022    2:49 PM 09/13/2022    2:34 PM 09/10/2022   10:32 AM  Vitals with BMI  Height 5\' 7"  5\' 7"    Weight 204 lbs 13 oz 206 lbs   BMI 32.07 32.26   Systolic 148 129 347  Diastolic 80 66 67  Pulse 66 65     CONSTITUTIONAL: Well-developed and well-nourished. No acute distress.   SKIN: Skin is warm and dry. No rash noted. No cyanosis. No pallor. No jaundice HEAD: Normocephalic and atraumatic.  EYES: No scleral icterus MOUTH/THROAT: Moist oral membranes.  NECK: No JVD present. No thyromegaly noted. No carotid bruits  CHEST Normal  respiratory effort. No intercostal retractions  LUNGS: Clear to auscultation bilaterally.  No stridor. No wheezes. No rales.  CARDIOVASCULAR: Regular rate and rhythm, positive S1-S2, no murmurs rubs or gallops appreciated. ABDOMINAL: Soft, nontender, nondistended, positive bowel sounds in all 4 quadrants.  No apparent ascites.  EXTREMITIES: Bilateral +trace pitting edema.  No ecchymosis noted in the right thigh.   HEMATOLOGIC: No significant bruising NEUROLOGIC: Oriented to person, place, and time. Nonfocal. Normal muscle tone.  PSYCHIATRIC: Normal mood and affect. Normal behavior. Cooperative  CARDIAC DATABASE: Direct-current cardioversion 08/28/2022:  150 J x 2 followed by 200 J x 1 without success.  08/29/2022: 150 J x 1 converted to sinus rhythm  EKG: 09/27/2022: Sinus rhythm, 63 bpm, normal axis, without underlying ischemia or injury pattern.  No significant change compared to 09/13/2022.  Echocardiogram: 08/12/2022:  Normal LV systolic function with visual EF 60-65%. Left ventricle cavity  is normal in size. Normal left ventricular wall thickness. Normal global  wall motion. Normal diastolic filling pattern, normal LAP.  Aortic valve sclerosis without stenosis.  Mild tricuspid regurgitation. No evidence of pulmonary hypertension.  Compared to 04/29/2021 otherwise no significant change   Stress Testing:  Exercise myoview stress test  04/23/2019: Patient exercised for a total of 6:13 min., achieving approximately 7 METs. Normal BP response. Exercise was terminated due to fatigue/weakness and achieving THR. Normal myocardial perfusion. All segments of left ventricle demonstrated normal wall motion and thickening. Stress LV EF is hyperdynamic 75%. Low risk study.    Event Monitor for 30 days Start date 07/11/2019: There were no symptomatic triggered events. Predominant rhythm is normal sinus rhythm. Rare PACs and PVCs. No atrial fibrillation.   21 day event monitor 07/07-07/27/2020:  NSR with occasional PAC/PVC. 1 patient triggered event without reported symptoms correlated with NSR with PAC and PVC. 2 auto detected events occurred for NSR with 5 beats of NSVT a t 173 bpm on day 1 at 1453 and NSR with 6 beats of NSVT on day 12 at 1651. Both were asymptomatic. 1 auto detected event on day 14 for Sinus tachycardia with frequent PAC's. No A fib was noted.  LABORATORY DATA:    Latest Ref Rng & Units 09/10/2022   11:04 AM 09/09/2022    6:17 AM 09/08/2022   12:48 AM  CBC  Hemoglobin 13.0 - 17.0 g/dL 42.5  95.6  9.9   Hematocrit 39.0 - 52.0 % 34.4  30.5  30.1        Latest Ref Rng & Units 09/07/2022    4:58 AM 09/03/2022    1:01 PM 09/03/2022   11:46  AM  CMP  Glucose 70 - 99 mg/dL 962  952  841   BUN 8 - 23 mg/dL 14  40  41   Creatinine 0.61 - 1.24 mg/dL 3.24  4.01  0.27   Sodium 135 - 145 mmol/L 134  136  133   Potassium 3.5 - 5.1 mmol/L 4.2  3.8  3.8   Chloride 98 - 111 mmol/L 103  100  100   CO2 22 - 32 mmol/L 25   23   Calcium 8.9 - 10.3 mg/dL 8.7   8.9   Total Protein 6.5 - 8.1 g/dL   6.5   Total Bilirubin 0.3 - 1.2 mg/dL   1.7   Alkaline Phos 38 - 126 U/L   37   AST 15 - 41 U/L   39   ALT 0 - 44 U/L   27     Lipid Panel     Component Value Date/Time   CHOL 139 07/12/2019 1026   TRIG 115 07/12/2019 1026   HDL 46 07/12/2019 1026   CHOLHDL 3.0 07/12/2019 1026   LDLCALC 72 07/12/2019 1026   LABVLDL 21 07/12/2019 1026    Lab Results  Component Value Date   HGBA1C 6.4 (H) 08/13/2022   HGBA1C 6.0 (H) 04/16/2021   HGBA1C 5.8 (H) 07/12/2016   No components found for: "NTPROBNP" Lab Results  Component Value Date   TSH 3.918 04/30/2021    Cardiac Panel (last 3 results) No results for input(s): "CKTOTAL", "CKMB", "TROPONINIHS", "RELINDX" in the last 72 hours.  External Labs: Collected: 05/14/2021 provided by PCP Hemoglobin 10.6 g/dL, hematocrit 25.3%. Sodium 140, potassium 4.4, chloride 105, bicarb 27, BUN 14, creatinine 0.87 AST 18, ALT 22, alkaline  phosphatase 67  Name Result Date Reference Range  TSH   2021-11-11    TSH 0.98   0.34-4.50  FOBT FIT ([id number])   2021-09-16    Occult Blood, Fecal, IA Negative   Negative  Thyroid Diagnostic Cascade   2021-09-10    TSH 0.33   0.34-4.50  Hemoglobin A1c   2021-09-10    eAG 140      Hgb A1c 6.5   4.8-5.6  PSA   2021-09-10    PSA 2.21   0.01-4.00  Free T4   2021-09-10    FT4 0.89   0.61-1.12  Lipid Panel w/reflex   2021-09-10    LDL Chol Calc (NIH) 44   0-99  CHOL/HDL 2.6   2.0-4.0  Cholesterol 119   <200  HDLD 46   30-70  LDL Chol Calc (NIH) 44   0-99  NHDL 73   0-129  Triglyceride 176   0-199    IMPRESSION:    ICD-10-CM   1. Paroxysmal atrial fibrillation (HCC)  I48.0 EKG 12-Lead    2. Long term (current) use of anticoagulants  Z79.01     3. Long term current use of antiarrhythmic drug  Z79.899     4. Essential hypertension  I10     5. TIA (transient ischemic attack)  G45.9     6. Pure hypercholesterolemia  E78.00     7. OSA on CPAP  G47.33     8. Former smoker  Z87.891        RECOMMENDATIONS: Yong Zahorik. is a 72 y.o. male whose past medical history and cardiovascular risk factors include: Paroxysmal fibrillation status post cardioversion, prediabetes, hypertension, hyperlipidemia, chronically low testosterone, hypothyroidism, former smoker with 15 pack year history, anxiety, osteoarthritis, history of TIA, advanced age.  Paroxysmal atrial fibrillation (HCC) Rate control: Metoprolol. Rhythm control: Flecainide Thromboembolic prophylaxis: Eliquis. Status post cardioversion January 2024 as noted above. EKG today illustrates sinus rhythm. Has been on Eliquis 5 mg p.o. twice daily since 09/21/2022.  He does not endorse evidence of bleeding. Patient understands the importance of being on both flecainide and metoprolol.  He should not take flecainide in the absence of AV nodal blocking agents.  Long term (current) use of anticoagulants Does not  endorse evidence of bleeding. CHA2DS2-VASc SCORE is 4 which correlates to 4.6% risk of stroke per year (TIA, age, HTN). Patient has had at least 2 episodes of acute blood loss anemia after surgery requiring him to have packed red blood cells.  No obvious organic reasons of bleeding.  However, we did discuss being considered for possible watchman implantation to minimize his risk of thromboembolic event and avoid long-term anticoagulation.  Patient and wife to discuss this further.  Long-term use of antiarrhythmic medications: Currently on flecainide. Medication profile discussed. If he remains on flecainide for long-term recommend GXT once he recovers from his hip surgery to rule out exercise-induced arrhythmias.  Essential hypertension Office blood pressures are well-controlled. Continue current medical therapy. Reemphasized importance of a low-salt diet  TIA (transient ischemic attack) Educated on the importance of secondary prevention.  Pure hypercholesterolemia Currently on Crestor.   He denies myalgia or other side effects. Currently managed by primary care provider.  OSA on CPAP Patient is compliant with his CPAP on a daily basis. Follows with Dr. Vickey Huger.  FINAL MEDICATION LIST END OF ENCOUNTER: No orders of the defined types were placed in this encounter.    Current Outpatient Medications:    acetaminophen (TYLENOL) 325 MG tablet, Take 650 mg by mouth every 6 (six) hours as needed for moderate pain., Disp: , Rfl:    apixaban (ELIQUIS) 5 MG TABS tablet, Take 5 mg by mouth 2 (two) times daily., Disp: , Rfl:    cholecalciferol (VITAMIN D3) 25 MCG (1000 UT) tablet, Take 1,000 Units by mouth daily., Disp: , Rfl:    clobetasol ointment (TEMOVATE) 0.05 %, Apply 1 Application topically 2 (two) times daily as needed (itching)., Disp: , Rfl:    clonazePAM (KLONOPIN) 0.5 MG tablet, Take 0.5-1 tablets (0.25-0.5 mg total) by mouth at bedtime as needed for anxiety. (Patient taking  differently: Take 0.5 mg by mouth at bedtime as needed for anxiety (sleep).), Disp: 90 tablet, Rfl: 1   flecainide (TAMBOCOR) 50 MG tablet, Take 1 tablet (50 mg total) by mouth 2 (two) times daily., Disp: 60 tablet, Rfl: 1   hydrALAZINE (APRESOLINE) 25 MG tablet, Take 1 tablet (25 mg total) by mouth 3 (three) times daily as needed. Take as needed for blood pressure greater than 150/90, Disp: 90 tablet, Rfl: 3   hydrochlorothiazide (HYDRODIURIL) 25 MG tablet, Take 1 tablet (25 mg total) by mouth every morning., Disp: 90 tablet, Rfl: 0   metFORMIN (GLUCOPHAGE) 500 MG tablet, Take 500 mg by mouth daily with breakfast., Disp: , Rfl:    metoprolol succinate (TOPROL-XL) 25 MG 24 hr tablet, Take 1 tablet (25 mg total) by mouth every morning., Disp: 90 tablet, Rfl: 0   Multiple Vitamins-Minerals (MULTIVITAMIN WITH MINERALS) tablet, Take 1 tablet by mouth daily., Disp: , Rfl:    Multiple Vitamins-Minerals (PRESERVISION AREDS 2 PO), Take 1 capsule by mouth daily., Disp: , Rfl:    olmesartan (BENICAR) 40 MG tablet, Take 1 tablet (40 mg total) by mouth daily at 10 pm., Disp: 90 tablet, Rfl:  0   pyridOXINE (VITAMIN B-6) 100 MG tablet, Take 100 mg by mouth daily., Disp: , Rfl:    rosuvastatin (CRESTOR) 20 MG tablet, TAKE ONE TABLET AT BEDTIME. (Patient taking differently: Take 20 mg by mouth daily.), Disp: 90 tablet, Rfl: 2   sodium chloride flush (NS) 0.9 % SOLN, 5 mLs by Intracatheter route daily. (Must discard remaining 5ml in syringe), Disp: 300 mL, Rfl: 0   spironolactone (ALDACTONE) 25 MG tablet, Take 1 tablet (25 mg total) by mouth daily., Disp: 90 tablet, Rfl: 3   SYNTHROID 88 MCG tablet, Take 88 mcg by mouth daily before breakfast., Disp: , Rfl:    valACYclovir (VALTREX) 1000 MG tablet, Take 1,000 mg by mouth daily as needed (Herpes flare)., Disp: , Rfl:   Orders Placed This Encounter  Procedures   EKG 12-Lead    --Continue cardiac medications as reconciled in final medication list. --Return in  about 6 months (around 03/28/2023) for Follow up, A. fib. Or sooner if needed. --Continue follow-up with your primary care physician regarding the management of your other chronic comorbid conditions.  Patient's questions and concerns were addressed to his satisfaction. He voices understanding of the instructions provided during this encounter.   This note was created using a voice recognition software as a result there may be grammatical errors inadvertently enclosed that do not reflect the nature of this encounter. Every attempt is made to correct such errors.  Tessa Lerner, [place], [facility]  Pager: [phone] Office: [phone]

## 2022-09-30 ENCOUNTER — Other Ambulatory Visit (HOSPITAL_COMMUNITY): Payer: Self-pay

## 2022-10-03 DIAGNOSIS — G4733 Obstructive sleep apnea (adult) (pediatric): Secondary | ICD-10-CM | POA: Diagnosis not present

## 2022-10-07 ENCOUNTER — Ambulatory Visit: Payer: Medicare HMO | Admitting: Cardiology

## 2022-10-08 DIAGNOSIS — T84010D Broken internal right hip prosthesis, subsequent encounter: Secondary | ICD-10-CM | POA: Diagnosis not present

## 2022-10-11 ENCOUNTER — Other Ambulatory Visit: Payer: Self-pay | Admitting: Cardiology

## 2022-10-11 DIAGNOSIS — Z7901 Long term (current) use of anticoagulants: Secondary | ICD-10-CM

## 2022-10-11 DIAGNOSIS — I4892 Unspecified atrial flutter: Secondary | ICD-10-CM

## 2022-11-01 ENCOUNTER — Other Ambulatory Visit: Payer: Self-pay | Admitting: Cardiology

## 2022-11-01 DIAGNOSIS — G4733 Obstructive sleep apnea (adult) (pediatric): Secondary | ICD-10-CM | POA: Diagnosis not present

## 2022-11-01 NOTE — Telephone Encounter (Signed)
Please check.

## 2022-11-01 NOTE — Telephone Encounter (Signed)
Patient is asking for a refill

## 2022-11-03 ENCOUNTER — Other Ambulatory Visit: Payer: Self-pay

## 2022-11-08 ENCOUNTER — Other Ambulatory Visit: Payer: Self-pay

## 2022-11-10 MED ORDER — FLECAINIDE ACETATE 50 MG PO TABS: 50.0000 mg | ORAL_TABLET | Freq: Two times a day (BID) | ORAL | 1 refills | Status: DC

## 2022-11-10 NOTE — Telephone Encounter (Signed)
[place] sent

## 2022-11-12 DIAGNOSIS — H524 Presbyopia: Secondary | ICD-10-CM | POA: Diagnosis not present

## 2022-11-12 DIAGNOSIS — H52223 Regular astigmatism, bilateral: Secondary | ICD-10-CM | POA: Diagnosis not present

## 2022-11-18 ENCOUNTER — Ambulatory Visit: Payer: Medicare HMO | Admitting: Adult Health

## 2022-11-24 DIAGNOSIS — Z1211 Encounter for screening for malignant neoplasm of colon: Secondary | ICD-10-CM | POA: Diagnosis not present

## 2022-12-02 DIAGNOSIS — G4733 Obstructive sleep apnea (adult) (pediatric): Secondary | ICD-10-CM | POA: Diagnosis not present

## 2022-12-05 ENCOUNTER — Other Ambulatory Visit: Payer: Self-pay | Admitting: Cardiology

## 2022-12-05 DIAGNOSIS — I4892 Unspecified atrial flutter: Secondary | ICD-10-CM

## 2022-12-05 DIAGNOSIS — I10 Essential (primary) hypertension: Secondary | ICD-10-CM

## 2022-12-13 DIAGNOSIS — S90852A Superficial foreign body, left foot, initial encounter: Secondary | ICD-10-CM | POA: Diagnosis not present

## 2022-12-13 DIAGNOSIS — E119 Type 2 diabetes mellitus without complications: Secondary | ICD-10-CM | POA: Diagnosis not present

## 2022-12-13 DIAGNOSIS — E1169 Type 2 diabetes mellitus with other specified complication: Secondary | ICD-10-CM | POA: Diagnosis not present

## 2022-12-27 ENCOUNTER — Encounter: Payer: Self-pay | Admitting: Neurology

## 2022-12-27 ENCOUNTER — Ambulatory Visit: Payer: Medicare HMO | Admitting: Neurology

## 2022-12-27 VITALS — BP 143/82 | HR 59 | Ht 68.0 in | Wt 203.6 lb

## 2022-12-27 DIAGNOSIS — I4891 Unspecified atrial fibrillation: Secondary | ICD-10-CM

## 2022-12-27 DIAGNOSIS — I4892 Unspecified atrial flutter: Secondary | ICD-10-CM

## 2022-12-27 DIAGNOSIS — I484 Atypical atrial flutter: Secondary | ICD-10-CM | POA: Diagnosis not present

## 2022-12-27 DIAGNOSIS — Z8673 Personal history of transient ischemic attack (TIA), and cerebral infarction without residual deficits: Secondary | ICD-10-CM | POA: Diagnosis not present

## 2022-12-27 MED ORDER — CLONAZEPAM 0.5 MG PO TABS: 0.5000 mg | ORAL_TABLET | Freq: Every day | ORAL | 5 refills | Status: DC

## 2022-12-27 NOTE — Patient Instructions (Signed)
ASSESSMENT AND PLAN 72 y.o. year old male  here with:    1)CPAP for OSA, compliance - machine due in 07-2024 for replacement.   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 Vivid dreams persist but he is not having the same reaction.   3) atrial fib, atrial flutter - had 2 cardio-versions , no ablation and is now in NSR.  Had the atrial fib as a reaction to intraoperative stress, and remains anticoagulated, had lost blood with heart cath.  He received blood transfusions. Anemia persist.   I plan to follow up either personally or through our NP within 12 months - the patient will undergo HST before a new CPAP will be issued. Continue on Klonopin.
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
## 2022-12-29 DIAGNOSIS — L821 Other seborrheic keratosis: Secondary | ICD-10-CM | POA: Diagnosis not present

## 2022-12-29 DIAGNOSIS — L859 Epidermal thickening, unspecified: Secondary | ICD-10-CM | POA: Diagnosis not present

## 2022-12-29 DIAGNOSIS — D1801 Hemangioma of skin and subcutaneous tissue: Secondary | ICD-10-CM | POA: Diagnosis not present

## 2022-12-29 DIAGNOSIS — L905 Scar conditions and fibrosis of skin: Secondary | ICD-10-CM | POA: Diagnosis not present

## 2022-12-29 DIAGNOSIS — L820 Inflamed seborrheic keratosis: Secondary | ICD-10-CM | POA: Diagnosis not present

## 2022-12-29 DIAGNOSIS — R234 Changes in skin texture: Secondary | ICD-10-CM | POA: Diagnosis not present

## 2022-12-29 DIAGNOSIS — D485 Neoplasm of uncertain behavior of skin: Secondary | ICD-10-CM | POA: Diagnosis not present

## 2023-01-01 DIAGNOSIS — G4733 Obstructive sleep apnea (adult) (pediatric): Secondary | ICD-10-CM | POA: Diagnosis not present

## 2023-01-18 ENCOUNTER — Ambulatory Visit: Payer: Medicare HMO | Admitting: Cardiology

## 2023-01-18 ENCOUNTER — Ambulatory Visit: Payer: Medicare HMO

## 2023-02-01 ENCOUNTER — Other Ambulatory Visit: Payer: Self-pay | Admitting: Cardiology

## 2023-02-01 DIAGNOSIS — G4733 Obstructive sleep apnea (adult) (pediatric): Secondary | ICD-10-CM | POA: Diagnosis not present

## 2023-03-03 ENCOUNTER — Other Ambulatory Visit: Payer: Self-pay | Admitting: Cardiology

## 2023-03-03 DIAGNOSIS — G4733 Obstructive sleep apnea (adult) (pediatric): Secondary | ICD-10-CM | POA: Diagnosis not present

## 2023-03-03 NOTE — Telephone Encounter (Signed)
Please refill.

## 2023-03-04 ENCOUNTER — Other Ambulatory Visit: Payer: Self-pay

## 2023-03-04 MED ORDER — FLECAINIDE ACETATE 50 MG PO TABS: 50.0000 mg | ORAL_TABLET | Freq: Two times a day (BID) | ORAL | 1 refills | Status: DC

## 2023-03-04 NOTE — Telephone Encounter (Signed)
Pt called stating pharmacy does not have prescription. Please send in to [facility] on [facility]

## 2023-03-07 ENCOUNTER — Other Ambulatory Visit: Payer: Self-pay | Admitting: Cardiology

## 2023-03-07 DIAGNOSIS — I4892 Unspecified atrial flutter: Secondary | ICD-10-CM

## 2023-03-07 DIAGNOSIS — I10 Essential (primary) hypertension: Secondary | ICD-10-CM

## 2023-03-15 DIAGNOSIS — I10 Essential (primary) hypertension: Secondary | ICD-10-CM | POA: Diagnosis not present

## 2023-03-15 DIAGNOSIS — E039 Hypothyroidism, unspecified: Secondary | ICD-10-CM | POA: Diagnosis not present

## 2023-03-15 DIAGNOSIS — M1610 Unilateral primary osteoarthritis, unspecified hip: Secondary | ICD-10-CM | POA: Diagnosis not present

## 2023-03-15 DIAGNOSIS — E119 Type 2 diabetes mellitus without complications: Secondary | ICD-10-CM | POA: Diagnosis not present

## 2023-03-28 ENCOUNTER — Ambulatory Visit: Payer: Medicare HMO | Admitting: Cardiology

## 2023-03-28 ENCOUNTER — Encounter: Payer: Self-pay | Admitting: Cardiology

## 2023-03-28 VITALS — BP 134/80 | HR 65 | Resp 16 | Ht 68.0 in | Wt 206.4 lb

## 2023-03-28 DIAGNOSIS — I10 Essential (primary) hypertension: Secondary | ICD-10-CM

## 2023-03-28 DIAGNOSIS — G4733 Obstructive sleep apnea (adult) (pediatric): Secondary | ICD-10-CM

## 2023-03-28 DIAGNOSIS — I480 Paroxysmal atrial fibrillation: Secondary | ICD-10-CM

## 2023-03-28 DIAGNOSIS — Z7901 Long term (current) use of anticoagulants: Secondary | ICD-10-CM

## 2023-03-28 DIAGNOSIS — Z79899 Other long term (current) drug therapy: Secondary | ICD-10-CM

## 2023-03-28 DIAGNOSIS — E7800 Pure hypercholesterolemia, unspecified: Secondary | ICD-10-CM

## 2023-03-28 DIAGNOSIS — G459 Transient cerebral ischemic attack, unspecified: Secondary | ICD-10-CM | POA: Diagnosis not present

## 2023-03-28 DIAGNOSIS — Z87891 Personal history of nicotine dependence: Secondary | ICD-10-CM | POA: Diagnosis not present

## 2023-03-28 MED ORDER — MULTAQ 400 MG PO TABS
400.0000 mg | ORAL_TABLET | Freq: Two times a day (BID) | ORAL | 0 refills | Status: DC
Start: 2023-04-03 — End: 2023-07-01

## 2023-03-28 NOTE — Progress Notes (Signed)
Norman Hudson. Date of Birth: [birth year] MRN: [id number] Primary Care Provider:Timberlake, Meridee Score, MD Former Cardiology Providers: Altamese Strawn, APRN, FNP-C Primary Cardiologist: Tessa Lerner, DO, [facility] (established care 02/21/2020)  Date: 03/28/23 Last Office Visit: 09/27/2022  Chief Complaint  Patient presents with   Paroxysmal atrial fibrillation (HCC)    HPI  Norman Hudson. is a 72 y.o.  male whose past medical history and cardiovascular risk factors include: Paroxysmal fibrillation status post cardioversion, prediabetes, hypertension, hyperlipidemia, chronically low testosterone, hypothyroidism, former smoker with 15 pack year history, anxiety, osteoarthritis, history of TIA, advanced age.  Patient is being followed by the practice for paroxysmal atrial fibrillation.  After his recent hip revision surgery in January 2024 he went into A-fib with RVR and on 08/28/2022 was cardioverted x 3 but was unsuccessful.  He underwent repeat cardioversion the following day on 08/29/2022 and converted to sinus rhythm.  Since then he has been placed on flecainide and metoprolol.  Patient presents today for 62-month follow-up visit.   He denies anginal chest pain or heart failure symptoms.  Patient still does not feel comfortable exercising on a treadmill given the initiation of flecainide in the recent past.  He prefers to be transition to a different medical therapy.  He recently had labs with PCP.  I do not have the most recent labs available for review but will request records.  ALLERGIES: Allergies  Allergen Reactions   Bactrim [Sulfamethoxazole-Trimethoprim] Hives and Itching    "threw me for a loop, hives, itching, extreme fatigue"    MEDICATION LIST PRIOR TO VISIT: Current Outpatient Medications on File Prior to Visit  Medication Sig Dispense Refill   acetaminophen (TYLENOL) 325 MG tablet Take 650 mg by mouth every 6 (six) hours as needed for moderate  pain.     apixaban (ELIQUIS) 5 MG TABS tablet TAKE ONE TABLET BY MOUTH TWICE DAILY 180 tablet 1   cholecalciferol (VITAMIN D3) 25 MCG (1000 UT) tablet Take 1,000 Units by mouth daily.     clobetasol ointment (TEMOVATE) 0.05 % Apply 1 Application topically 2 (two) times daily as needed (itching).     clonazePAM (KLONOPIN) 0.5 MG tablet Take 1 tablet (0.5 mg total) by mouth at bedtime. 30 tablet 5   hydrochlorothiazide (HYDRODIURIL) 25 MG tablet Take 1 tablet (25 mg total) by mouth every morning. 90 tablet 0   metFORMIN (GLUCOPHAGE) 500 MG tablet Take 500 mg by mouth 2 (two) times daily with a meal.     metoprolol succinate (TOPROL-XL) 25 MG 24 hr tablet Take 1 tablet (25 mg total) by mouth every morning. 90 tablet 0   Multiple Vitamins-Minerals (MULTIVITAMIN WITH MINERALS) tablet Take 1 tablet by mouth daily.     Multiple Vitamins-Minerals (PRESERVISION AREDS 2 PO) Take 1 capsule by mouth daily.     olmesartan (BENICAR) 40 MG tablet Take 1 tablet (40 mg total) by mouth daily at 10 pm. 90 tablet 0   pyridOXINE (VITAMIN B-6) 100 MG tablet Take 100 mg by mouth daily.     rosuvastatin (CRESTOR) 20 MG tablet Take 1 tablet (20 mg total) by mouth daily. 90 tablet 2   spironolactone (ALDACTONE) 25 MG tablet Take 1 tablet (25 mg total) by mouth daily. 90 tablet 3   SYNTHROID 88 MCG tablet Take 88 mcg by mouth daily before breakfast.     valACYclovir (VALTREX) 1000 MG tablet Take 1,000 mg by mouth daily as needed (Herpes flare).     No current facility-administered medications on  file prior to visit.    PAST MEDICAL HISTORY: Past Medical History:  Diagnosis Date   Acne    Anxiety    takes Xanax daily as needed   Arthritis    Atrial flutter, paroxysmal (HCC)    History of kidney stones    Hyperlipidemia    takes Pravastatin daily   Hypertension    takes Amlodipine and Valsartan-HCTZ daily   Hypothyroidism    takes Synthroid daily   Inguinal hernia    left side   Internal hemorrhoids     Joint pain    Kidney stones    Pre-diabetes    Rosacea    TIA (transient ischemic attack) 06/2019    PAST SURGICAL HISTORY: Past Surgical History:  Procedure Laterality Date   CARDIOVERSION N/A 08/28/2022   Procedure: CARDIOVERSION;  Surgeon: Yates Decamp, MD;  Location: [facility];  Service: Cardiovascular;  Laterality: N/A;   CARDIOVERSION N/A 08/29/2022   Procedure: CARDIOVERSION;  Surgeon: Yates Decamp, MD;  Location: [facility];  Service: Cardiovascular;  Laterality: N/A;   COLONOSCOPY     EXTRACORPOREAL SHOCK WAVE LITHOTRIPSY Right 12/27/2019   Procedure: EXTRACORPOREAL SHOCK WAVE LITHOTRIPSY (ESWL);  Surgeon: Malen Gauze, MD;  Location[facility];  Service: Urology;  Laterality: Right;   IR US GUIDE BX ASP/DRAIN  09/06/2022   LITHOTRIPSY  09/2015   LUMBAR LAMINECTOMY WITH COFLEX 1 LEVEL N/A 07/07/2015   Procedure: LUMBAR FOUR-FIVE LUMBAR LAMINECTOMY WITH COFLEX;  Surgeon: Tia Alert, MD;  Location: [facility];  Service: Neurosurgery;  Laterality: N/A;   TONSILLECTOMY     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 HEAD AND LINER EXCHANGE. POSSIBLE CUP REVISION;  Surgeon: Joen Laura, MD;  Location: [facility];  Service: Orthopedics;  Laterality: Right;    FAMILY HISTORY: The patient's family history includes Alcoholism in his mother; Aneurysm in his sister; Atrial fibrillation in his sister; CVA in his father; Cancer in his maternal grandfather; Diabetes in his paternal grandmother; Hypertension in his father, paternal grandmother, and sister; Sleep disorder in his father.   SOCIAL HISTORY:  The  patient  reports that he quit smoking about 34 years ago. His smoking use included cigarettes. He started smoking about 49 years ago. He has a 15 pack-year smoking history. He has never used smokeless tobacco. He reports that he does not drink alcohol and does not use drugs.  Review of Systems  Cardiovascular:  Negative for chest pain, claudication, dyspnea on exertion, irregular heartbeat, leg swelling, near-syncope, orthopnea, palpitations, paroxysmal nocturnal dyspnea and syncope.  Respiratory:  Negative for shortness of breath.   Hematologic/Lymphatic: Negative for bleeding problem.  Musculoskeletal:  Negative for muscle cramps and myalgias.  Neurological:  Negative for dizziness and light-headedness.    PHYSICAL EXAM:    03/28/2023   12:02 PM 12/27/2022    9:21 AM 09/27/2022    2:49 PM  Vitals with BMI  Height 5\' 8"  5\' 8"  5\' 7"   Weight 206 lbs 6 oz 203 lbs 10 oz 204 lbs 13 oz  BMI 31.39 30.96 32.07  Systolic 134 143 865  Diastolic 80 82 80  Pulse 65 59  66    Physical Exam  Constitutional: No distress.  Age appropriate, hemodynamically stable.   Neck: No JVD present.  Cardiovascular: Regular rhythm, S1 normal, S2 normal, intact distal pulses and normal pulses. Bradycardia present. Exam reveals no gallop, no S3 and no S4.  No murmur heard. Pulmonary/Chest: Effort normal and breath sounds normal. No stridor. He has no wheezes. He has no rales.  Abdominal: Soft. Bowel sounds are normal. He exhibits no distension. There is no abdominal tenderness.  Musculoskeletal:        General: Edema (trace b/l) present.     Cervical back: Neck supple.  Neurological: He is alert and oriented to person, place, and time. He has intact cranial nerves (2-12).  Skin: Skin is warm and moist.   CARDIAC DATABASE: Direct-current cardioversion 08/28/2022:  150 J x 2 followed by 200 J x 1 without success.  08/29/2022: 150 J x 1 converted to sinus rhythm  EKG: 09/27/2022: Sinus rhythm, 63 bpm, normal  axis, without underlying ischemia or injury pattern.  No significant change compared to 09/13/2022.  March 28, 2023: Sinus bradycardia, 58 bpm, first-degree AV block, okay without underlying ischemia or injury pattern.  Echocardiogram: 08/12/2022:  Normal LV systolic function with visual EF 60-65%. Left ventricle cavity  is normal in size. Normal left ventricular wall thickness. Normal global  wall motion. Normal diastolic filling pattern, normal LAP.  Aortic valve sclerosis without stenosis.  Mild tricuspid regurgitation. No evidence of pulmonary hypertension.  Compared to 04/29/2021 otherwise no significant change   Stress Testing:  Exercise myoview stress test  04/23/2019: Patient exercised for a total of 6:13 min., achieving approximately 7 METs. Normal BP response. Exercise was terminated due to fatigue/weakness and achieving THR. Normal myocardial perfusion. All segments of left ventricle demonstrated normal wall motion and thickening. Stress LV EF is hyperdynamic 75%. Low risk study.    Event Monitor for 30 days Start date 07/11/2019: There were no symptomatic triggered events. Predominant rhythm is normal sinus rhythm. Rare PACs and PVCs. No atrial fibrillation.   21 day event monitor 07/07-07/27/2020: NSR with occasional PAC/PVC. 1 patient triggered event without reported symptoms correlated with NSR with PAC and PVC. 2 auto detected events occurred for NSR with 5 beats of NSVT a t 173 bpm on day 1 at 1453 and NSR with 6 beats of NSVT on day 12 at 1651. Both were asymptomatic. 1 auto detected event on day 14 for Sinus tachycardia with frequent PAC's. No A fib was noted.  LABORATORY DATA:    Latest Ref Rng & Units 09/10/2022   11:04 AM 09/09/2022    6:17 AM 09/08/2022   12:48 AM  CBC  Hemoglobin 13.0 - 17.0 g/dL 62.1  30.8  9.9   Hematocrit 39.0 - 52.0 % 34.4  30.5  30.1        Latest Ref Rng & Units 09/07/2022    4:58 AM 09/03/2022    1:01 PM 09/03/2022   11:46 AM  CMP  Glucose  70 - 99 mg/dL 657  846  962   BUN 8 - 23 mg/dL 14  40  41   Creatinine 0.61 - 1.24 mg/dL 9.52  8.41  3.24   Sodium 135 - 145 mmol/L 134  136  133   Potassium 3.5 - 5.1 mmol/L 4.2  3.8  3.8   Chloride 98 - 111 mmol/L 103  100  100   CO2 22 - 32 mmol/L 25   23   Calcium 8.9 - 10.3 mg/dL  8.7   8.9   Total Protein 6.5 - 8.1 g/dL   6.5   Total Bilirubin 0.3 - 1.2 mg/dL   1.7   Alkaline Phos 38 - 126 U/L   37   AST 15 - 41 U/L   39   ALT 0 - 44 U/L   27     Lipid Panel     Component Value Date/Time   CHOL 139 07/12/2019 1026   TRIG 115 07/12/2019 1026   HDL 46 07/12/2019 1026   CHOLHDL 3.0 07/12/2019 1026   LDLCALC 72 07/12/2019 1026   LABVLDL 21 07/12/2019 1026    Lab Results  Component Value Date   HGBA1C 6.4 (H) 08/13/2022   HGBA1C 6.0 (H) 04/16/2021   HGBA1C 5.8 (H) 07/12/2016   No components found for: "NTPROBNP" Lab Results  Component Value Date   TSH 3.918 04/30/2021    Cardiac Panel (last 3 results) No results for input(s): "CKTOTAL", "CKMB", "TROPONINIHS", "RELINDX" in the last 72 hours.  External Labs: Collected: 05/14/2021 provided by PCP Hemoglobin 10.6 g/dL, hematocrit 57.8%. Sodium 140, potassium 4.4, chloride 105, bicarb 27, BUN 14, creatinine 0.87 AST 18, ALT 22, alkaline phosphatase 67  Name Result Date Reference Range  TSH   2021-11-11    TSH 0.98   0.34-4.50  FOBT FIT (469629)   2021-09-16    Occult Blood, Fecal, IA Negative   Negative  Thyroid Diagnostic Cascade   2021-09-10    TSH 0.33   0.34-4.50  Hemoglobin A1c   2021-09-10    eAG 140      Hgb A1c 6.5   4.8-5.6  PSA   2021-09-10    PSA 2.21   0.01-4.00  Free T4   2021-09-10    FT4 0.89   0.61-1.12  Lipid Panel w/reflex   2021-09-10    LDL Chol Calc (NIH) 44   0-99  CHOL/HDL 2.6   2.0-4.0  Cholesterol 119   <200  HDLD 46   30-70  LDL Chol Calc (NIH) 44   0-99  NHDL 73   0-129  Triglyceride 176   0-199    IMPRESSION:    ICD-10-CM   1. Paroxysmal atrial fibrillation (HCC)  I48.0  EKG 12-Lead    dronedarone (MULTAQ) 400 MG tablet    2. Long term (current) use of anticoagulants  Z79.01     3. Long term current use of antiarrhythmic drug  Z79.899 dronedarone (MULTAQ) 400 MG tablet    4. Essential hypertension  I10     5. TIA (transient ischemic attack)  G45.9     6. Pure hypercholesterolemia  E78.00     7. OSA on CPAP  G47.33     8. Former smoker  Z87.891        RECOMMENDATIONS: Carron Nee. is a 72 y.o. male whose past medical history and cardiovascular risk factors include: Paroxysmal fibrillation status post cardioversion, prediabetes, hypertension, hyperlipidemia, chronically low testosterone, hypothyroidism, former smoker with 15 pack year history, anxiety, osteoarthritis, history of TIA, advanced age.  Paroxysmal atrial fibrillation (HCC) Rate control: Metoprolol. Rhythm control: Change flecainide to Multaq Thromboembolic prophylaxis: Eliquis. Status post cardioversion January 2024 as noted above. EKG today illustrates sinus bradycardia. Discontinue flecainide as he is not willing to exercise on a treadmill after initiation of flecainide due to concerns for hip dysfunction after his recent revision. I have asked him to discontinue flecainide as of 03/28/2023.  No antiarrhythmics for 5 days and to start Multaq on 04/03/2023.  Long  term (current) use of anticoagulants Does not endorse evidence of bleeding. CHA2DS2-VASc SCORE is 4 which correlates to 4.6% risk of stroke per year (TIA, age, HTN). Patient has had at least 2 episodes of acute blood loss anemia after surgery requiring him to have packed red blood cells.  We have discussed alternatives to anticoagulation such as Watchman device in the past.  He has not requested a referral to structural heart.  Long-term use of antiarrhythmic medications: Currently on flecainide and will transition to Multaq as discussed above Medication profile discussed. Samples and co-pay card  provided.  Essential hypertension Office blood pressures are well-controlled. Continue current medical therapy. Reemphasized importance of a low-salt diet  TIA (transient ischemic attack) Educated on the importance of secondary prevention.  Pure hypercholesterolemia Currently on Crestor.   He denies myalgia or other side effects. Currently managed by primary care provider.  OSA on CPAP Patient is compliant with his CPAP on a daily basis. Follows with Dr. Vickey Huger.  FINAL MEDICATION LIST END OF ENCOUNTER: Meds ordered this encounter  Medications   dronedarone (MULTAQ) 400 MG tablet    Sig: Take 1 tablet (400 mg total) by mouth 2 (two) times daily with a meal.    Dispense:  180 tablet    Refill:  0     Current Outpatient Medications:    acetaminophen (TYLENOL) 325 MG tablet, Take 650 mg by mouth every 6 (six) hours as needed for moderate pain., Disp: , Rfl:    apixaban (ELIQUIS) 5 MG TABS tablet, TAKE ONE TABLET BY MOUTH TWICE DAILY, Disp: 180 tablet, Rfl: 1   cholecalciferol (VITAMIN D3) 25 MCG (1000 UT) tablet, Take 1,000 Units by mouth daily., Disp: , Rfl:    clobetasol ointment (TEMOVATE) 0.05 %, Apply 1 Application topically 2 (two) times daily as needed (itching)., Disp: , Rfl:    clonazePAM (KLONOPIN) 0.5 MG tablet, Take 1 tablet (0.5 mg total) by mouth at bedtime., Disp: 30 tablet, Rfl: 5   [START ON 04/03/2023] dronedarone (MULTAQ) 400 MG tablet, Take 1 tablet (400 mg total) by mouth 2 (two) times daily with a meal., Disp: 180 tablet, Rfl: 0   hydrochlorothiazide (HYDRODIURIL) 25 MG tablet, Take 1 tablet (25 mg total) by mouth every morning., Disp: 90 tablet, Rfl: 0   metFORMIN (GLUCOPHAGE) 500 MG tablet, Take 500 mg by mouth 2 (two) times daily with a meal., Disp: , Rfl:    metoprolol succinate (TOPROL-XL) 25 MG 24 hr tablet, Take 1 tablet (25 mg total) by mouth every morning., Disp: 90 tablet, Rfl: 0   Multiple Vitamins-Minerals (MULTIVITAMIN WITH MINERALS) tablet, Take  1 tablet by mouth daily., Disp: , Rfl:    Multiple Vitamins-Minerals (PRESERVISION AREDS 2 PO), Take 1 capsule by mouth daily., Disp: , Rfl:    olmesartan (BENICAR) 40 MG tablet, Take 1 tablet (40 mg total) by mouth daily at 10 pm., Disp: 90 tablet, Rfl: 0   pyridOXINE (VITAMIN B-6) 100 MG tablet, Take 100 mg by mouth daily., Disp: , Rfl:    rosuvastatin (CRESTOR) 20 MG tablet, Take 1 tablet (20 mg total) by mouth daily., Disp: 90 tablet, Rfl: 2   spironolactone (ALDACTONE) 25 MG tablet, Take 1 tablet (25 mg total) by mouth daily., Disp: 90 tablet, Rfl: 3   SYNTHROID 88 MCG tablet, Take 88 mcg by mouth daily before breakfast., Disp: , Rfl:    valACYclovir (VALTREX) 1000 MG tablet, Take 1,000 mg by mouth daily as needed (Herpes flare)., Disp: , Rfl:   Orders Placed  This Encounter  Procedures   EKG 12-Lead    --Continue cardiac medications as reconciled in final medication list. --Return in about 6 months (around 09/28/2023) for Follow up, A. fib. Or sooner if needed. --Continue follow-up with your primary care physician regarding the management of your other chronic comorbid conditions.  Patient's questions and concerns were addressed to his satisfaction. He voices understanding of the instructions provided during this encounter.   This note was created using a voice recognition software as a result there may be grammatical errors inadvertently enclosed that do not reflect the nature of this encounter. Every attempt is made to correct such errors.  Tessa Lerner, [place], [facility]  Pager: [phone] Office: [phone]

## 2023-03-29 ENCOUNTER — Encounter: Payer: Self-pay | Admitting: Cardiology

## 2023-03-31 NOTE — Progress Notes (Signed)
Called patient to inform about his lab results. Patient undertstood

## 2023-04-01 DIAGNOSIS — G4733 Obstructive sleep apnea (adult) (pediatric): Secondary | ICD-10-CM | POA: Diagnosis not present

## 2023-04-04 ENCOUNTER — Other Ambulatory Visit: Payer: Self-pay

## 2023-04-04 DIAGNOSIS — I951 Orthostatic hypotension: Secondary | ICD-10-CM

## 2023-04-05 LAB — HEMOGLOBIN: Hemoglobin: 13.3 g/dL (ref 13.0–17.7)

## 2023-04-07 ENCOUNTER — Other Ambulatory Visit: Payer: Self-pay | Admitting: Cardiology

## 2023-04-07 DIAGNOSIS — Z7901 Long term (current) use of anticoagulants: Secondary | ICD-10-CM

## 2023-04-07 DIAGNOSIS — I4892 Unspecified atrial flutter: Secondary | ICD-10-CM

## 2023-05-26 ENCOUNTER — Other Ambulatory Visit: Payer: Self-pay | Admitting: Cardiology

## 2023-05-28 ENCOUNTER — Ambulatory Visit (HOSPITAL_BASED_OUTPATIENT_CLINIC_OR_DEPARTMENT_OTHER)
Admission: EM | Admit: 2023-05-28 | Discharge: 2023-05-29 | Disposition: A | Discharge status: Home / Self Care | Payer: Medicare HMO | Attending: Gastroenterology | Admitting: Gastroenterology

## 2023-05-28 ENCOUNTER — Other Ambulatory Visit: Payer: Self-pay

## 2023-05-28 ENCOUNTER — Encounter (HOSPITAL_BASED_OUTPATIENT_CLINIC_OR_DEPARTMENT_OTHER): Payer: Self-pay | Admitting: Emergency Medicine

## 2023-05-28 DIAGNOSIS — Z79899 Other long term (current) drug therapy: Secondary | ICD-10-CM | POA: Insufficient documentation

## 2023-05-28 DIAGNOSIS — K222 Esophageal obstruction: Secondary | ICD-10-CM | POA: Diagnosis not present

## 2023-05-28 DIAGNOSIS — R059 Cough, unspecified: Secondary | ICD-10-CM | POA: Diagnosis not present

## 2023-05-28 DIAGNOSIS — R1310 Dysphagia, unspecified: Secondary | ICD-10-CM | POA: Diagnosis not present

## 2023-05-28 DIAGNOSIS — G4730 Sleep apnea, unspecified: Secondary | ICD-10-CM | POA: Insufficient documentation

## 2023-05-28 DIAGNOSIS — I4891 Unspecified atrial fibrillation: Secondary | ICD-10-CM | POA: Insufficient documentation

## 2023-05-28 DIAGNOSIS — R7303 Prediabetes: Secondary | ICD-10-CM | POA: Diagnosis not present

## 2023-05-28 DIAGNOSIS — W44F3XA Food entering into or through a natural orifice, initial encounter: Secondary | ICD-10-CM | POA: Insufficient documentation

## 2023-05-28 DIAGNOSIS — Z7984 Long term (current) use of oral hypoglycemic drugs: Secondary | ICD-10-CM | POA: Diagnosis not present

## 2023-05-28 DIAGNOSIS — Z7901 Long term (current) use of anticoagulants: Secondary | ICD-10-CM | POA: Diagnosis not present

## 2023-05-28 DIAGNOSIS — D649 Anemia, unspecified: Secondary | ICD-10-CM | POA: Insufficient documentation

## 2023-05-28 DIAGNOSIS — Z87891 Personal history of nicotine dependence: Secondary | ICD-10-CM | POA: Insufficient documentation

## 2023-05-28 DIAGNOSIS — Z833 Family history of diabetes mellitus: Secondary | ICD-10-CM | POA: Diagnosis not present

## 2023-05-28 DIAGNOSIS — N289 Disorder of kidney and ureter, unspecified: Secondary | ICD-10-CM | POA: Diagnosis not present

## 2023-05-28 DIAGNOSIS — T18128A Food in esophagus causing other injury, initial encounter: Secondary | ICD-10-CM | POA: Diagnosis not present

## 2023-05-28 DIAGNOSIS — E039 Hypothyroidism, unspecified: Secondary | ICD-10-CM | POA: Diagnosis not present

## 2023-05-28 DIAGNOSIS — Z79890 Hormone replacement therapy (postmenopausal): Secondary | ICD-10-CM | POA: Insufficient documentation

## 2023-05-28 DIAGNOSIS — I10 Essential (primary) hypertension: Secondary | ICD-10-CM | POA: Diagnosis not present

## 2023-05-28 DIAGNOSIS — Z743 Need for continuous supervision: Secondary | ICD-10-CM | POA: Diagnosis not present

## 2023-05-28 DIAGNOSIS — M1990 Unspecified osteoarthritis, unspecified site: Secondary | ICD-10-CM | POA: Insufficient documentation

## 2023-05-28 LAB — CBC WITH DIFFERENTIAL/PLATELET
Abs Immature Granulocytes: 0.06 10*3/uL (ref 0.00–0.07)
Basophils Absolute: 0 10*3/uL (ref 0.0–0.1)
Basophils Relative: 0 %
Eosinophils Absolute: 0 10*3/uL (ref 0.0–0.5)
Eosinophils Relative: 1 %
HCT: 36.2 % — ABNORMAL LOW (ref 39.0–52.0)
Hemoglobin: 12.6 g/dL — ABNORMAL LOW (ref 13.0–17.0)
Immature Granulocytes: 1 %
Lymphocytes Relative: 16 %
Lymphs Abs: 1.2 10*3/uL (ref 0.7–4.0)
MCH: 36.1 pg — ABNORMAL HIGH (ref 26.0–34.0)
MCHC: 34.8 g/dL (ref 30.0–36.0)
MCV: 103.7 fL — ABNORMAL HIGH (ref 80.0–100.0)
Monocytes Absolute: 0.5 10*3/uL (ref 0.1–1.0)
Monocytes Relative: 6 %
Neutro Abs: 5.7 10*3/uL (ref 1.7–7.7)
Neutrophils Relative %: 76 %
Platelets: 141 10*3/uL — ABNORMAL LOW (ref 150–400)
RBC: 3.49 MIL/uL — ABNORMAL LOW (ref 4.22–5.81)
RDW: 12.6 % (ref 11.5–15.5)
WBC: 7.6 10*3/uL (ref 4.0–10.5)
nRBC: 0 % (ref 0.0–0.2)

## 2023-05-28 MED ORDER — DIAZEPAM 5 MG/ML IJ SOLN
2.5000 mg | Freq: Once | INTRAMUSCULAR | Status: AC
  Administered 2023-05-28: 2.5 mg via INTRAVENOUS
  Filled 2023-05-28: qty 2

## 2023-05-28 MED ORDER — GLUCAGON HCL RDNA (DIAGNOSTIC) 1 MG IJ SOLR
1.0000 mg | Freq: Once | INTRAMUSCULAR | Status: AC
Start: 2023-05-28 — End: 2023-05-28
  Administered 2023-05-28: 1 mg via INTRAVENOUS
  Filled 2023-05-28: qty 1

## 2023-05-28 MED ORDER — ONDANSETRON HCL 4 MG/2ML IJ SOLN
4.0000 mg | Freq: Once | INTRAMUSCULAR | Status: AC
  Administered 2023-05-28: 4 mg via INTRAVENOUS
  Filled 2023-05-28: qty 2

## 2023-05-28 NOTE — ED Provider Notes (Signed)
[place] EMERGENCY DEPARTMENT AT [facility] Provider Note   CSN: [id number] Arrival date & time: 05/28/23  2120     History {Add pertinent medical, surgical, social history, OB history to HPI:1} Chief Complaint  Patient presents with   Food Bolus    Norman Hudson. is a 72 y.o. male.  Patient presents with suspected food impaction.  He was eating steak and felt something is stuck in his throat about 2 hours ago.  He said difficulty swallowing his secretions since then.  Spitting up phlegm but no vomiting.  This has happened before but never needed intervention and he was able to pass food boluses at home.  Feels short of breath and feels like he cannot swallow his secretions.  Denies chest pain.  Denies fever.  Denies abdominal pain. He does take Eliquis.  The history is provided by the patient.       Home Medications Prior to Admission medications   Medication Sig Start Date End Date Taking? Authorizing Provider  acetaminophen (TYLENOL) 325 MG tablet Take 650 mg by mouth every 6 (six) hours as needed for moderate pain.    [provider]  cholecalciferol (VITAMIN D3) 25 MCG (1000 UT) tablet Take 1,000 Units by mouth daily.    [provider]  clobetasol ointment (TEMOVATE) 0.05 % Apply 1 Application topically 2 (two) times daily as needed (itching). 05/14/21   [provider]  clonazePAM (KLONOPIN) 0.5 MG tablet Take 1 tablet (0.5 mg total) by mouth at bedtime. 12/27/22   Dohmeier, Porfirio Mylar, MD  dronedarone (MULTAQ) 400 MG tablet Take 1 tablet (400 mg total) by mouth 2 (two) times daily with a meal. 04/03/23 07/02/23  Tolia, Sunit, DO  ELIQUIS 5 MG TABS tablet TAKE ONE TABLET BY MOUTH TWICE DAILY 04/08/23   Tolia, Sunit, DO  hydrochlorothiazide (HYDRODIURIL) 25 MG tablet Take 1 tablet (25 mg total) by mouth every morning. 03/07/23 06/05/23  Tolia, Sunit, DO  metFORMIN (GLUCOPHAGE) 500 MG tablet Take 500 mg by mouth 2 (two) times daily  with a meal.    [provider]  metoprolol succinate (TOPROL-XL) 25 MG 24 hr tablet Take 1 tablet (25 mg total) by mouth every morning. 03/07/23 06/05/23  Tolia, Sunit, DO  Multiple Vitamins-Minerals (MULTIVITAMIN WITH MINERALS) tablet Take 1 tablet by mouth daily.    [provider]  Multiple Vitamins-Minerals (PRESERVISION AREDS 2 PO) Take 1 capsule by mouth daily.    [provider]  olmesartan (BENICAR) 40 MG tablet Take 1 tablet (40 mg total) by mouth daily at 10 pm. 03/07/23 06/05/23  Tolia, Sunit, DO  pyridOXINE (VITAMIN B-6) 100 MG tablet Take 100 mg by mouth daily.    [provider]  rosuvastatin (CRESTOR) 20 MG tablet Take 1 tablet (20 mg total) by mouth daily. 02/01/23   Tolia, Sunit, DO  spironolactone (ALDACTONE) 25 MG tablet Take 1 tablet (25 mg total) by mouth daily. 05/26/23   Tolia, Sunit, DO  SYNTHROID 88 MCG tablet Take 88 mcg by mouth daily before breakfast. 06/11/22   [provider]  valACYclovir (VALTREX) 1000 MG tablet Take 1,000 mg by mouth daily as needed (Herpes flare).    [provider]      Allergies    Bactrim [sulfamethoxazole-trimethoprim]    Review of Systems   Review of Systems  Constitutional:  Negative for activity change, appetite change and fever.  HENT:  Positive for sore throat and trouble swallowing. Negative for congestion.   Respiratory:  Negative  for cough, chest tightness and shortness of breath.   Gastrointestinal:  Negative for abdominal pain, nausea and vomiting.  Genitourinary:  Negative for dysuria and hematuria.  Musculoskeletal:  Negative for arthralgias and myalgias.  Skin:  Negative for rash.  Neurological:  Negative for dizziness, weakness and headaches.   all other systems are negative except as noted in the HPI and PMH.    Physical Exam Updated Vital Signs BP 133/70   Pulse 75   Temp 97.9 F (36.6 C) (Oral)   SpO2 99%  Physical Exam Vitals and nursing note reviewed.   Constitutional:      General: He is not in acute distress.    Appearance: He is well-developed.     Comments: Uncomfortable  HENT:     Head: Normocephalic and atraumatic.     Mouth/Throat:     Pharynx: No oropharyngeal exudate.     Comments: Oropharynx clear, spitting in emesis bag Eyes:     Conjunctiva/sclera: Conjunctivae normal.     Pupils: Pupils are equal, round, and reactive to light.  Neck:     Comments: No meningismus. Cardiovascular:     Rate and Rhythm: Normal rate and regular rhythm.     Heart sounds: Normal heart sounds. No murmur heard. Pulmonary:     Effort: Pulmonary effort is normal. No respiratory distress.     Breath sounds: Normal breath sounds.  Abdominal:     Palpations: Abdomen is soft.     Tenderness: There is no abdominal tenderness. There is no guarding or rebound.  Musculoskeletal:        General: No tenderness. Normal range of motion.     Cervical back: Normal range of motion and neck supple.  Skin:    General: Skin is warm.  Neurological:     Mental Status: He is alert and oriented to person, place, and time.     Cranial Nerves: No cranial nerve deficit.     Motor: No abnormal muscle tone.     Coordination: Coordination normal.     Comments:  5/5 strength throughout. CN 2-12 intact.Equal grip strength.   Psychiatric:        Behavior: Behavior normal.     ED Results / Procedures / Treatments   Labs (all labs ordered are listed, but only abnormal results are displayed) Labs Reviewed  CBC WITH DIFFERENTIAL/PLATELET  BASIC METABOLIC PANEL  TROPONIN I (HIGH SENSITIVITY)    EKG None  Radiology No results found.  Procedures Procedures  {Document cardiac monitor, telemetry assessment procedure when appropriate:1}  Medications Ordered in ED Medications  glucagon (human recombinant) (GLUCAGEN) injection 1 mg (has no administration in time range)  diazepam (VALIUM) injection 2.5 mg (has no administration in time range)  ondansetron  (ZOFRAN) injection 4 mg (4 mg Intravenous Given 05/28/23 2221)    ED Course/ Medical Decision Making/ A&P   {   Click here for ABCD2, HEART and other calculatorsREFRESH Note before signing :1}                              Medical Decision Making Amount and/or Complexity of Data Reviewed Labs: ordered. Decision-making details documented in ED Course. Radiology: ordered and independent interpretation performed. Decision-making details documented in ED Course. ECG/medicine tests: ordered and independent interpretation performed. Decision-making details documented in ED Course.  Risk Prescription drug management.   Suspected esophageal food impaction.  Stable vital signs.  Airway is stable.  Controlling some secretions.  Some difficulty swallowing spit.  Will give Zofran, glucagon and Valium  {Document critical care time when appropriate:1} {Document review of labs and clinical decision tools ie heart score, Chads2Vasc2 etc:1}  {Document your independent review of radiology images, and any outside records:1} {Document your discussion with family members, caretakers, and with consultants:1} {Document social determinants of health affecting pt's care:1} {Document your decision making why or why not admission, treatments were needed:1} Final Clinical Impression(s) / ED Diagnoses Final diagnoses:  None    Rx / DC Orders ED Discharge Orders     None

## 2023-05-28 NOTE — ED Triage Notes (Addendum)
BIB EMS from the [facility].  Was eating and got something stuck in his throat.  Hx of prior esophageal obstructions. Usually can work them out himself but cannot get this loose.  EMS gave water but felt the water didn't go past the point of obstruction. Airway clear.  Breath sounds clear. No pain.  Drooling.  VSS.  Food that is lodged is a piece of beef

## 2023-05-29 ENCOUNTER — Emergency Department (EMERGENCY_DEPARTMENT_HOSPITAL): Payer: Medicare HMO | Admitting: Anesthesiology

## 2023-05-29 ENCOUNTER — Emergency Department (HOSPITAL_COMMUNITY): Payer: Medicare HMO | Admitting: Anesthesiology

## 2023-05-29 ENCOUNTER — Encounter (HOSPITAL_COMMUNITY)
Admission: EM | Disposition: A | Discharge status: Home / Self Care | Payer: Self-pay | Source: Home / Self Care | Attending: Emergency Medicine

## 2023-05-29 ENCOUNTER — Encounter (HOSPITAL_COMMUNITY): Payer: Self-pay

## 2023-05-29 DIAGNOSIS — E039 Hypothyroidism, unspecified: Secondary | ICD-10-CM | POA: Diagnosis not present

## 2023-05-29 DIAGNOSIS — K2289 Other specified disease of esophagus: Secondary | ICD-10-CM | POA: Diagnosis not present

## 2023-05-29 DIAGNOSIS — Z7984 Long term (current) use of oral hypoglycemic drugs: Secondary | ICD-10-CM | POA: Diagnosis not present

## 2023-05-29 DIAGNOSIS — T18128A Food in esophagus causing other injury, initial encounter: Secondary | ICD-10-CM | POA: Diagnosis not present

## 2023-05-29 DIAGNOSIS — Z7901 Long term (current) use of anticoagulants: Secondary | ICD-10-CM | POA: Diagnosis not present

## 2023-05-29 DIAGNOSIS — T18108A Unspecified foreign body in esophagus causing other injury, initial encounter: Secondary | ICD-10-CM | POA: Diagnosis not present

## 2023-05-29 DIAGNOSIS — K222 Esophageal obstruction: Secondary | ICD-10-CM

## 2023-05-29 DIAGNOSIS — I10 Essential (primary) hypertension: Secondary | ICD-10-CM | POA: Diagnosis not present

## 2023-05-29 DIAGNOSIS — Z87891 Personal history of nicotine dependence: Secondary | ICD-10-CM | POA: Diagnosis not present

## 2023-05-29 DIAGNOSIS — G4730 Sleep apnea, unspecified: Secondary | ICD-10-CM | POA: Diagnosis not present

## 2023-05-29 DIAGNOSIS — W44F3XA Food entering into or through a natural orifice, initial encounter: Secondary | ICD-10-CM | POA: Diagnosis not present

## 2023-05-29 DIAGNOSIS — Z79899 Other long term (current) drug therapy: Secondary | ICD-10-CM | POA: Diagnosis not present

## 2023-05-29 DIAGNOSIS — R1310 Dysphagia, unspecified: Secondary | ICD-10-CM | POA: Diagnosis not present

## 2023-05-29 DIAGNOSIS — Z833 Family history of diabetes mellitus: Secondary | ICD-10-CM | POA: Diagnosis not present

## 2023-05-29 DIAGNOSIS — I4891 Unspecified atrial fibrillation: Secondary | ICD-10-CM | POA: Diagnosis not present

## 2023-05-29 DIAGNOSIS — R7303 Prediabetes: Secondary | ICD-10-CM | POA: Diagnosis not present

## 2023-05-29 DIAGNOSIS — N289 Disorder of kidney and ureter, unspecified: Secondary | ICD-10-CM | POA: Diagnosis not present

## 2023-05-29 DIAGNOSIS — D649 Anemia, unspecified: Secondary | ICD-10-CM | POA: Diagnosis not present

## 2023-05-29 DIAGNOSIS — Z79890 Hormone replacement therapy (postmenopausal): Secondary | ICD-10-CM | POA: Diagnosis not present

## 2023-05-29 DIAGNOSIS — M1990 Unspecified osteoarthritis, unspecified site: Secondary | ICD-10-CM | POA: Diagnosis not present

## 2023-05-29 HISTORY — PX: IMPACTION REMOVAL: SHX5858

## 2023-05-29 HISTORY — PX: ESOPHAGOGASTRODUODENOSCOPY: SHX5428

## 2023-05-29 LAB — BASIC METABOLIC PANEL
Anion gap: 11 (ref 5–15)
BUN: 27 mg/dL — ABNORMAL HIGH (ref 8–23)
CO2: 25 mmol/L (ref 22–32)
Calcium: 9.5 mg/dL (ref 8.9–10.3)
Chloride: 105 mmol/L (ref 98–111)
Creatinine, Ser: 1.27 mg/dL — ABNORMAL HIGH (ref 0.61–1.24)
GFR, Estimated: 60 mL/min — ABNORMAL LOW (ref >60)
Glucose, Bld: 202 mg/dL — ABNORMAL HIGH (ref 70–99)
Potassium: 4.1 mmol/L (ref 3.5–5.1)
Sodium: 141 mmol/L (ref 135–145)

## 2023-05-29 LAB — TROPONIN I (HIGH SENSITIVITY): Troponin I (High Sensitivity): 3 ng/L (ref <18)

## 2023-05-29 SURGERY — EGD (ESOPHAGOGASTRODUODENOSCOPY)
Anesthesia: General

## 2023-05-29 MED ORDER — SUGAMMADEX SODIUM 200 MG/2ML IV SOLN
INTRAVENOUS | Status: DC | PRN
  Administered 2023-05-29: 190 mg via INTRAVENOUS

## 2023-05-29 MED ORDER — FENTANYL CITRATE (PF) 100 MCG/2ML IJ SOLN: 25.0000 ug | INTRAMUSCULAR | Status: DC | PRN

## 2023-05-29 MED ORDER — DEXAMETHASONE SODIUM PHOSPHATE 10 MG/ML IJ SOLN
INTRAMUSCULAR | Status: DC | PRN
  Administered 2023-05-29: 4 mg via INTRAVENOUS

## 2023-05-29 MED ORDER — PROPOFOL 10 MG/ML IV BOLUS
INTRAVENOUS | Status: DC | PRN
  Administered 2023-05-29: 150 mg via INTRAVENOUS
  Administered 2023-05-29: 50 mg via INTRAVENOUS

## 2023-05-29 MED ORDER — SUCCINYLCHOLINE CHLORIDE 200 MG/10ML IV SOSY
PREFILLED_SYRINGE | INTRAVENOUS | Status: DC | PRN
  Administered 2023-05-29: 100 mg via INTRAVENOUS

## 2023-05-29 MED ORDER — ESMOLOL HCL 100 MG/10ML IV SOLN
INTRAVENOUS | Status: DC | PRN
  Administered 2023-05-29: 20 mg via INTRAVENOUS
  Administered 2023-05-29: 30 mg via INTRAVENOUS

## 2023-05-29 MED ORDER — FENTANYL CITRATE PF 50 MCG/ML IJ SOSY
50.0000 ug | PREFILLED_SYRINGE | Freq: Once | INTRAMUSCULAR | Status: AC
  Administered 2023-05-29: 50 ug via INTRAVENOUS
  Filled 2023-05-29: qty 1

## 2023-05-29 MED ORDER — PHENYLEPHRINE 80 MCG/ML (10ML) SYRINGE FOR IV PUSH (FOR BLOOD PRESSURE SUPPORT)
PREFILLED_SYRINGE | INTRAVENOUS | Status: DC | PRN
  Administered 2023-05-29 (×2): 160 ug via INTRAVENOUS

## 2023-05-29 MED ORDER — LIDOCAINE 2% (20 MG/ML) 5 ML SYRINGE
INTRAMUSCULAR | Status: DC | PRN
  Administered 2023-05-29: 60 mg via INTRAVENOUS

## 2023-05-29 MED ORDER — AMISULPRIDE (ANTIEMETIC) 5 MG/2ML IV SOLN: 10.0000 mg | Freq: Once | INTRAVENOUS | Status: DC | PRN

## 2023-05-29 MED ORDER — ONDANSETRON HCL 4 MG/2ML IJ SOLN
INTRAMUSCULAR | Status: DC | PRN
  Administered 2023-05-29: 4 mg via INTRAVENOUS

## 2023-05-29 MED ORDER — ONDANSETRON HCL 4 MG/2ML IJ SOLN
4.0000 mg | Freq: Once | INTRAMUSCULAR | Status: AC
  Administered 2023-05-29: 4 mg via INTRAVENOUS
  Filled 2023-05-29: qty 2

## 2023-05-29 MED ORDER — SODIUM CHLORIDE 0.9 % IV SOLN
INTRAVENOUS | Status: DC | PRN
Start: 2023-05-29 — End: 2023-05-29

## 2023-05-29 MED ORDER — DIAZEPAM 5 MG/ML IJ SOLN
2.5000 mg | Freq: Once | INTRAMUSCULAR | Status: AC
Start: 2023-05-29 — End: 2023-05-29
  Administered 2023-05-29: 2.5 mg via INTRAVENOUS
  Filled 2023-05-29: qty 2

## 2023-05-29 MED ORDER — ROCURONIUM BROMIDE 50 MG/5ML IV SOSY
PREFILLED_SYRINGE | INTRAVENOUS | Status: DC | PRN
  Administered 2023-05-29: 30 mg via INTRAVENOUS

## 2023-05-29 NOTE — Anesthesia Procedure Notes (Signed)
Procedure Name: Intubation Date/Time: 05/29/2023 7:47 AM  Performed by: Adria Dill, CRNAPre-anesthesia Checklist: Patient identified, Emergency Drugs available, Suction available and Patient being monitored Patient Re-evaluated:Patient Re-evaluated prior to induction Oxygen Delivery Method: Circle system utilized Preoxygenation: Pre-oxygenation with 100% oxygen Induction Type: IV induction, Rapid sequence and Cricoid Pressure applied Laryngoscope Size: Miller and 3 Grade View: Grade I Tube type: Oral Tube size: 7.5 mm Number of attempts: 1 Airway Equipment and Method: Stylet Placement Confirmation: ETT inserted through vocal cords under direct vision, positive ETCO2 and breath sounds checked- equal and bilateral Secured at: 22 cm Tube secured with: Tape Dental Injury: Teeth and Oropharynx as per pre-operative assessment

## 2023-05-29 NOTE — ED Notes (Signed)
Pt arrived from [facility] with [facility] to go to endo. Was eating steak last night and fells it stuck in his throat; two failed attempts with glucagon throughout the night. [facility] reports 2 episodes of vomiting enroute

## 2023-05-29 NOTE — ED Notes (Signed)
Pt. Had been c/o a sore throat from trying to clear food bolus and spitting up. MD notified and ordered fentanyl which was given. Pt. Noe reports it has helped the pain significantly.

## 2023-05-29 NOTE — H&P (Signed)
Norman Hudson. is an 72 y.o. male.   Chief Complaint: Food bolus impaction HPI: He states he had beef kabeab at 8 pm yesterday which is lodges in his esophagus and has not moved despite hemilich maneuver, medications in ER.  Past Medical History:  Diagnosis Date   Acne    Anxiety    takes Xanax daily as needed   Arthritis    Atrial flutter, paroxysmal (HCC)    History of kidney stones    Hyperlipidemia    takes Pravastatin daily   Hypertension    takes Amlodipine and Valsartan-HCTZ daily   Hypothyroidism    takes Synthroid daily   Inguinal hernia    left side   Internal hemorrhoids    Joint pain    Kidney stones    Pre-diabetes    Rosacea    TIA (transient ischemic attack) 06/2019    Past Surgical History:  Procedure Laterality Date   CARDIOVERSION N/A 08/28/2022   Procedure: CARDIOVERSION;  Surgeon: Yates Decamp, MD;  Location: [facility];  Service: Cardiovascular;  Laterality: N/A;   CARDIOVERSION N/A 08/29/2022   Procedure: CARDIOVERSION;  Surgeon: Yates Decamp, MD;  Location: [facility];  Service: Cardiovascular;  Laterality: N/A;   COLONOSCOPY     EXTRACORPOREAL SHOCK WAVE LITHOTRIPSY Right 12/27/2019   Procedure: EXTRACORPOREAL SHOCK WAVE LITHOTRIPSY (ESWL);  Surgeon: Malen Gauze, MD;  Location: [facility];  Service: Urology;  Laterality: Right;   IR US GUIDE BX ASP/DRAIN  09/06/2022   LITHOTRIPSY  09/2015   LUMBAR LAMINECTOMY WITH COFLEX 1 LEVEL N/A 07/07/2015   Procedure: LUMBAR FOUR-FIVE LUMBAR LAMINECTOMY WITH COFLEX;  Surgeon: Tia Alert, MD;  Location: [facility];  Service: Neurosurgery;  Laterality: N/A;   TONSILLECTOMY     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   Procedure: TOTAL HIP ARTHROPLASTY;  Surgeon: Frederico Hamman, MD;  Location: [facility];  Service: Orthopedics;  Laterality: Left;   TOTAL HIP ARTHROPLASTY Right 04/24/2021   Procedure: TOTAL HIP ARTHROPLASTY;  Surgeon: Frederico Hamman, MD;  Location: [facility];  Service:  Orthopedics;  Laterality: Right;   TOTAL HIP REVISION Right 08/23/2022   Procedure: TOTAL HIP REVISION. HEAD AND LINER EXCHANGE. POSSIBLE CUP REVISION;  Surgeon: Joen Laura, MD;  Location: [facility];  Service: Orthopedics;  Laterality: Right;    Family History  Problem Relation Age of Onset   Hypertension Father    Sleep disorder Father    CVA Father    Cancer Maternal Grandfather    Hypertension Paternal Grandmother    Diabetes Paternal Grandmother    Alcoholism Mother    Hypertension Sister    Aneurysm Sister    Atrial fibrillation Sister    Other Neg Hx        hypogonadism   Sleep apnea Neg Hx    Social History:  reports that he quit smoking about 34 years ago. His smoking use included cigarettes. He started smoking about 49 years ago. He has a 15 pack-year smoking history. He has never used smokeless tobacco. He reports that he does not drink alcohol and does not use drugs.  Allergies:  Allergies  Allergen Reactions   Bactrim [Sulfamethoxazole-Trimethoprim] Hives and Itching    "threw me for a loop, hives, itching, extreme fatigue"    Medications Prior to Admission  Medication Sig Dispense Refill   acetaminophen (TYLENOL) 325 MG tablet Take 650 mg by mouth every 6 (six) hours as needed for moderate pain.     cholecalciferol (VITAMIN D3)  25 MCG (1000 UT) tablet Take 1,000 Units by mouth daily.     clobetasol ointment (TEMOVATE) 0.05 % Apply 1 Application topically 2 (two) times daily as needed (itching).     clonazePAM (KLONOPIN) 0.5 MG tablet Take 1 tablet (0.5 mg total) by mouth at bedtime. 30 tablet 5   dronedarone (MULTAQ) 400 MG tablet Take 1 tablet (400 mg total) by mouth 2 (two) times daily with a meal. 180 tablet 0   ELIQUIS 5 MG TABS tablet TAKE ONE TABLET BY MOUTH TWICE DAILY 180 tablet 1   hydrochlorothiazide (HYDRODIURIL) 25 MG tablet Take 1 tablet (25 mg total) by mouth every morning. 90 tablet 0   metFORMIN (GLUCOPHAGE) 500 MG tablet Take 500 mg by mouth  2 (two) times daily with a meal.     metoprolol succinate (TOPROL-XL) 25 MG 24 hr tablet Take 1 tablet (25 mg total) by mouth every morning. 90 tablet 0   Multiple Vitamins-Minerals (MULTIVITAMIN WITH MINERALS) tablet Take 1 tablet by mouth daily.     Multiple Vitamins-Minerals (PRESERVISION AREDS 2 PO) Take 1 capsule by mouth daily.     olmesartan (BENICAR) 40 MG tablet Take 1 tablet (40 mg total) by mouth daily at 10 pm. 90 tablet 0   pyridOXINE (VITAMIN B-6) 100 MG tablet Take 100 mg by mouth daily.     rosuvastatin (CRESTOR) 20 MG tablet Take 1 tablet (20 mg total) by mouth daily. 90 tablet 2   spironolactone (ALDACTONE) 25 MG tablet Take 1 tablet (25 mg total) by mouth daily. 90 tablet 3   SYNTHROID 88 MCG tablet Take 88 mcg by mouth daily before breakfast.     valACYclovir (VALTREX) 1000 MG tablet Take 1,000 mg by mouth daily as needed (Herpes flare).      Results for orders placed or performed during the hospital encounter of 05/28/23 (from the past 48 hour(s))  CBC with Differential     Status: Abnormal   Collection Time: 05/28/23 11:44 PM  Result Value Ref Range   WBC 7.6 4.0 - 10.5 K/uL   RBC 3.49 (L) 4.22 - 5.81 MIL/uL   Hemoglobin 12.6 (L) 13.0 - 17.0 g/dL   HCT 16.1 (L) 09.6 - 04.5 %   MCV 103.7 (H) 80.0 - 100.0 fL   MCH 36.1 (H) 26.0 - 34.0 pg   MCHC 34.8 30.0 - 36.0 g/dL   RDW 40.9 81.1 - 91.4 %   Platelets 141 (L) 150 - 400 K/uL   nRBC 0.0 0.0 - 0.2 %   Neutrophils Relative % 76 %   Neutro Abs 5.7 1.7 - 7.7 K/uL   Lymphocytes Relative 16 %   Lymphs Abs 1.2 0.7 - 4.0 K/uL   Monocytes Relative 6 %   Monocytes Absolute 0.5 0.1 - 1.0 K/uL   Eosinophils Relative 1 %   Eosinophils Absolute 0.0 0.0 - 0.5 K/uL   Basophils Relative 0 %   Basophils Absolute 0.0 0.0 - 0.1 K/uL   Immature Granulocytes 1 %   Abs Immature Granulocytes 0.06 0.00 - 0.07 K/uL    Comment: Performed at [facility], [address] [place], [place] [address]  Basic metabolic  panel     Status: Abnormal   Collection Time: 05/28/23 11:44 PM  Result Value Ref Range   Sodium 141 135 - 145 mmol/L   Potassium 4.1 3.5 - 5.1 mmol/L   Chloride 105 98 - 111 mmol/L   CO2 25 22 - 32 mmol/L   Glucose, Bld 202 (H)  70 - 99 mg/dL    Comment: Glucose reference range applies only to samples taken after fasting for at least 8 hours.   BUN 27 (H) 8 - 23 mg/dL   Creatinine, Ser 1.61 (H) 0.61 - 1.24 mg/dL   Calcium 9.5 8.9 - 09.6 mg/dL   GFR, Estimated 60 (L) >60 mL/min    Comment: (NOTE) Calculated using the CKD-EPI Creatinine Equation (2021)    Anion gap 11 5 - 15    Comment: Performed at [facility], [address] [place], [place] [address]  Troponin I (High Sensitivity)     Status: None   Collection Time: 05/28/23 11:44 PM  Result Value Ref Range   Troponin I (High Sensitivity) 3 <18 ng/L    Comment: (NOTE) Elevated high sensitivity troponin I (hsTnI) values and significant  changes across serial measurements may suggest ACS but many other  chronic and acute conditions are known to elevate hsTnI results.  Refer to the "Links" section for chest pain algorithms and additional  guidance. Performed at [facility], [address] [place], [place] [address]    No results found.  Review of Systems  Constitutional: Negative.   HENT: Negative.    Respiratory: Negative.    Cardiovascular: Negative.   Gastrointestinal: Negative.   Neurological: Negative.     Blood pressure (!) 154/85, pulse 84, temperature 97.8 F (36.6 C), temperature source Temporal, resp. rate 16, height 5\' 7"  (1.702 m), weight 93 kg, SpO2 99%. Physical Exam Constitutional:      Appearance: He is normal weight.  HENT:     Head: Normocephalic and atraumatic.  Eyes:     Conjunctiva/sclera: Conjunctivae normal.  Cardiovascular:     Rate and Rhythm: Normal rate and regular rhythm.     Pulses: Normal pulses.     Heart sounds: Normal heart sounds.   Pulmonary:     Effort: Pulmonary effort is normal.     Breath sounds: Normal breath sounds.  Skin:    General: Skin is warm and dry.  Neurological:     General: No focal deficit present.     Mental Status: He is oriented to person, place, and time.  Psychiatric:        Mood and Affect: Mood normal.        Behavior: Behavior normal.      Assessment/Plan Esophageal food bolus impaction. EGD  now.  Kerin Salen, MD 05/29/2023, 7:36 AM

## 2023-05-29 NOTE — Anesthesia Preprocedure Evaluation (Signed)
Anesthesia Evaluation  Patient identified by MRN, date of birth, ID band Patient awake    Reviewed: Allergy & Precautions, NPO status , Patient's Chart, lab work & pertinent test results  Airway Mallampati: II  TM Distance: >3 FB Neck ROM: Full    Dental  (+) Dental Advisory Given   Pulmonary sleep apnea , former smoker   breath sounds clear to auscultation       Cardiovascular hypertension, Pt. on home beta blockers and Pt. on medications + dysrhythmias Atrial Fibrillation  Rhythm:Regular Rate:Normal     Neuro/Psych TIA   GI/Hepatic Neg liver ROS,,,Food impaction   Endo/Other  Hypothyroidism    Renal/GU Renal InsufficiencyRenal disease     Musculoskeletal  (+) Arthritis ,    Abdominal   Peds  Hematology  (+) Blood dyscrasia, anemia   Anesthesia Other Findings   Reproductive/Obstetrics                             Anesthesia Physical Anesthesia Plan  ASA: 3  Anesthesia Plan: General   Post-op Pain Management:    Induction: Intravenous and Rapid sequence  PONV Risk Score and Plan: 2 and Dexamethasone, Ondansetron and Treatment may vary due to age or medical condition  Airway Management Planned: Oral ETT  Additional Equipment:   Intra-op Plan:   Post-operative Plan: Extubation in OR  Informed Consent: I have reviewed the patients History and Physical, chart, labs and discussed the procedure including the risks, benefits and alternatives for the proposed anesthesia with the patient or authorized representative who has indicated his/her understanding and acceptance.     Dental advisory given  Plan Discussed with: CRNA  Anesthesia Plan Comments:        Anesthesia Quick Evaluation

## 2023-05-29 NOTE — Anesthesia Postprocedure Evaluation (Signed)
Anesthesia Post Note  Patient: Norman Hudson.  Procedure(s) Performed: ESOPHAGOGASTRODUODENOSCOPY (EGD) IMPACTION REMOVAL     Patient location during evaluation: PACU Anesthesia Type: General Level of consciousness: awake and alert Pain management: pain level controlled Vital Signs Assessment: post-procedure vital signs reviewed and stable Respiratory status: spontaneous breathing, nonlabored ventilation, respiratory function stable and patient connected to nasal cannula oxygen Cardiovascular status: blood pressure returned to baseline and stable Postop Assessment: no apparent nausea or vomiting Anesthetic complications: no  No notable events documented.  Last Vitals:  Vitals:   05/29/23 0830 05/29/23 0845  BP: 104/79 123/81  Pulse: 80 83  Resp: 20 16  Temp:  37.1 C  SpO2: 96% 97%    Last Pain:  Vitals:   05/29/23 0845  TempSrc:   PainSc: 0-No pain                 Kennieth Rad

## 2023-05-29 NOTE — Op Note (Signed)
[facility] Patient Name: Norman Hudson Procedure Date : 05/29/2023 MRN: [id number] Attending MD: Kerin Salen , MD, [id number] Date of Birth: August 07, 1951 CSN: [id number] Age: 72 Admit Type: Emergency Department Procedure:                Upper GI endoscopy Indications:              Dysphagia, Foreign body in the esophagus Providers:                Kerin Salen, MD, Eliberto Ivory, RN, Marja Kays,                            Technician Referring MD:             ER Medicines:                Monitored Anesthesia Care Complications:            No immediate complications. Estimated blood loss:                            Minimal. Estimated Blood Loss:     Estimated blood loss was minimal. Procedure:                Pre-Anesthesia Assessment:                           - Prior to the procedure, a History and Physical                            was performed, and patient medications and                            allergies were reviewed. The patient's tolerance of                            previous anesthesia was also reviewed. The risks                            and benefits of the procedure and the sedation                            options and risks were discussed with the patient.                            All questions were answered, and informed consent                            was obtained. Prior Anticoagulants: The patient has                            taken Eliquis (apixaban), last dose was 1 day prior                            to procedure. ASA Grade Assessment: III - A patient  with severe systemic disease. After reviewing the                            risks and benefits, the patient was deemed in                            satisfactory condition to undergo the procedure.                           After obtaining informed consent, the endoscope was                            passed under direct vision. Throughout the                             procedure, the patient's blood pressure, pulse, and                            oxygen saturations were monitored continuously. The                            [id number]-H190 ([id number]) Olympus endoscope was introduced                            through the mouth, and advanced to the second part                            of duodenum. The upper GI endoscopy was                            accomplished without difficulty. The patient                            tolerated the procedure well. Scope In: Scope Out: Findings:      Diffuse severe mucosal changes characterized by congestion and erythema       were found in the upper third of the esophagus.      Food was found in the middle third of the esophagus, 30 cm from the       incisors. Removal was accomplished with a Roth net. The foreign body       removal site was examined following endoscope reinsertion and showed       erosion, erythema, friable mucosa and inflammation.      The entire examined stomach was normal.      The cardia and gastric fundus were normal on retroflexion.      The examined duodenum was normal.      One benign-appearing, intrinsic moderate (circumferential scarring or       stenosis; an endoscope may pass) stenosis was found 30 cm from the       incisors. The stenosis was traversed. Impression:               - Congested, erythematous mucosa in the esophagus.                           - Food was found  in the esophagus. Removal was                            successful.                           - Normal stomach.                           - Normal examined duodenum.                           - Benign-appearing esophageal stenosis. Moderate Sedation:      Patient did not receive moderate sedation for this procedure, but       instead received monitored anesthesia care. Recommendation:           - Patient has a contact number available for                            emergencies. The signs and symptoms of potential                             delayed complications were discussed with the                            patient. Return to normal activities tomorrow.                            Written discharge instructions were provided to the                            patient.                           - Mechanical soft diet.                           - Continue present medications.                           - Resume Eliquis (apixaban) at prior dose tomorrow.                           - Repeat upper endoscopy in 6 weeks for retreatment                            for possible balloon dilation. Procedure Code(s):        --- Professional ---                           [phone], Esophagogastroduodenoscopy, flexible,                            transoral; with removal of foreign body(s) Diagnosis Code(s):        --- Professional ---  K22.89, Other specified disease of esophagus                           T18.128A, Food in esophagus causing other injury,                            initial encounter                           K22.2, Esophageal obstruction                           R13.10, Dysphagia, unspecified                           T18.108A, Unspecified foreign body in esophagus                            causing other injury, initial encounter CPT copyright 2022 [employer]. All rights reserved. The codes documented in this report are preliminary and upon coder review may  be revised to meet current compliance requirements. Kerin Salen, MD 05/29/2023 8:24:57 AM This report has been signed electronically. Number of Addenda: 0

## 2023-05-29 NOTE — ED Notes (Signed)
-  [facility]ed [facility] to transport patient to [facility] ED by 7a. Will dispatch a truck at 545a.

## 2023-05-29 NOTE — ED Notes (Signed)
Called patient's wife and informed her that [facility] was taking him to [facility] now

## 2023-05-29 NOTE — Discharge Instructions (Signed)
Do not take Eliquis today. Restart tomorrow. May restart other medications today.

## 2023-05-29 NOTE — ED Notes (Signed)
Pt name was called 3x, pt did not answer.

## 2023-05-29 NOTE — Transfer of Care (Signed)
Immediate Anesthesia Transfer of Care Note  Patient: Norman Hudson.  Procedure(s) Performed: ESOPHAGOGASTRODUODENOSCOPY (EGD) IMPACTION REMOVAL  Patient Location: PACU  Anesthesia Type:General  Level of Consciousness: awake and patient cooperative  Airway & Oxygen Therapy: Patient Spontanous Breathing and Patient connected to face mask oxygen  Post-op Assessment: Report given to RN and Post -op Vital signs reviewed and stable  Post vital signs: Reviewed and stable  Last Vitals:  Vitals Value Taken Time  BP 111/57 05/29/23 0813  Temp    Pulse 79 05/29/23 0816  Resp 12 05/29/23 0816  SpO2 97 % 05/29/23 0816  Vitals shown include unfiled device data.  Last Pain:  Vitals:   05/29/23 0716  TempSrc: Temporal  PainSc: 2          Complications: No notable events documented.

## 2023-05-29 NOTE — ED Notes (Signed)
Report given to Endo RN 

## 2023-06-01 ENCOUNTER — Encounter (HOSPITAL_COMMUNITY): Payer: Self-pay | Admitting: Gastroenterology

## 2023-06-02 ENCOUNTER — Encounter: Payer: Self-pay | Admitting: Cardiology

## 2023-06-02 ENCOUNTER — Other Ambulatory Visit: Payer: Self-pay | Admitting: Cardiology

## 2023-06-02 DIAGNOSIS — I4892 Unspecified atrial flutter: Secondary | ICD-10-CM

## 2023-06-02 DIAGNOSIS — I10 Essential (primary) hypertension: Secondary | ICD-10-CM

## 2023-06-09 NOTE — Telephone Encounter (Signed)
I have reviewed the EKG.   If GI recommends endoscopy and colonoscopy - please proceed forward after an informed consent.   Briceyda Abdullah [place], DO, [facility]

## 2023-06-30 DIAGNOSIS — G4733 Obstructive sleep apnea (adult) (pediatric): Secondary | ICD-10-CM | POA: Diagnosis not present

## 2023-07-01 ENCOUNTER — Other Ambulatory Visit: Payer: Self-pay | Admitting: Cardiology

## 2023-07-01 DIAGNOSIS — I480 Paroxysmal atrial fibrillation: Secondary | ICD-10-CM

## 2023-07-01 DIAGNOSIS — Z79899 Other long term (current) drug therapy: Secondary | ICD-10-CM

## 2023-07-04 ENCOUNTER — Other Ambulatory Visit: Payer: Self-pay | Admitting: Neurology

## 2023-07-05 NOTE — Telephone Encounter (Signed)
Last fill 06/07/2023 for 30 day supply

## 2023-07-30 DIAGNOSIS — G4733 Obstructive sleep apnea (adult) (pediatric): Secondary | ICD-10-CM | POA: Diagnosis not present

## 2023-08-03 ENCOUNTER — Other Ambulatory Visit: Payer: Self-pay

## 2023-08-03 ENCOUNTER — Emergency Department (EMERGENCY_DEPARTMENT_HOSPITAL): Payer: Medicare HMO | Admitting: Anesthesiology

## 2023-08-03 ENCOUNTER — Emergency Department (HOSPITAL_COMMUNITY): Payer: Medicare HMO

## 2023-08-03 ENCOUNTER — Emergency Department (HOSPITAL_COMMUNITY): Payer: Medicare HMO | Admitting: Anesthesiology

## 2023-08-03 ENCOUNTER — Encounter (HOSPITAL_COMMUNITY): Payer: Self-pay

## 2023-08-03 ENCOUNTER — Ambulatory Visit (HOSPITAL_COMMUNITY)
Admission: EM | Admit: 2023-08-03 | Discharge: 2023-08-03 | Disposition: A | Discharge status: Home / Self Care | Payer: Medicare HMO | Attending: Emergency Medicine | Admitting: Emergency Medicine

## 2023-08-03 ENCOUNTER — Encounter (HOSPITAL_COMMUNITY)
Admission: EM | Disposition: A | Discharge status: Home / Self Care | Payer: Self-pay | Source: Home / Self Care | Attending: Emergency Medicine

## 2023-08-03 DIAGNOSIS — Z87891 Personal history of nicotine dependence: Secondary | ICD-10-CM | POA: Diagnosis not present

## 2023-08-03 DIAGNOSIS — Z8673 Personal history of transient ischemic attack (TIA), and cerebral infarction without residual deficits: Secondary | ICD-10-CM | POA: Diagnosis not present

## 2023-08-03 DIAGNOSIS — T18128A Food in esophagus causing other injury, initial encounter: Secondary | ICD-10-CM

## 2023-08-03 DIAGNOSIS — K2289 Other specified disease of esophagus: Secondary | ICD-10-CM | POA: Diagnosis not present

## 2023-08-03 DIAGNOSIS — K449 Diaphragmatic hernia without obstruction or gangrene: Secondary | ICD-10-CM

## 2023-08-03 DIAGNOSIS — K2950 Unspecified chronic gastritis without bleeding: Secondary | ICD-10-CM | POA: Diagnosis not present

## 2023-08-03 DIAGNOSIS — K3189 Other diseases of stomach and duodenum: Secondary | ICD-10-CM | POA: Diagnosis not present

## 2023-08-03 DIAGNOSIS — Z7901 Long term (current) use of anticoagulants: Secondary | ICD-10-CM | POA: Diagnosis not present

## 2023-08-03 DIAGNOSIS — K2210 Ulcer of esophagus without bleeding: Secondary | ICD-10-CM | POA: Insufficient documentation

## 2023-08-03 DIAGNOSIS — E039 Hypothyroidism, unspecified: Secondary | ICD-10-CM | POA: Diagnosis not present

## 2023-08-03 DIAGNOSIS — W44F3XA Food entering into or through a natural orifice, initial encounter: Secondary | ICD-10-CM | POA: Insufficient documentation

## 2023-08-03 DIAGNOSIS — I480 Paroxysmal atrial fibrillation: Secondary | ICD-10-CM | POA: Diagnosis not present

## 2023-08-03 DIAGNOSIS — Z96643 Presence of artificial hip joint, bilateral: Secondary | ICD-10-CM | POA: Diagnosis not present

## 2023-08-03 DIAGNOSIS — Z79899 Other long term (current) drug therapy: Secondary | ICD-10-CM | POA: Diagnosis not present

## 2023-08-03 DIAGNOSIS — I10 Essential (primary) hypertension: Secondary | ICD-10-CM | POA: Diagnosis not present

## 2023-08-03 DIAGNOSIS — K222 Esophageal obstruction: Secondary | ICD-10-CM | POA: Insufficient documentation

## 2023-08-03 DIAGNOSIS — G4730 Sleep apnea, unspecified: Secondary | ICD-10-CM | POA: Diagnosis not present

## 2023-08-03 DIAGNOSIS — R1310 Dysphagia, unspecified: Secondary | ICD-10-CM | POA: Diagnosis not present

## 2023-08-03 DIAGNOSIS — T18108A Unspecified foreign body in esophagus causing other injury, initial encounter: Secondary | ICD-10-CM | POA: Diagnosis not present

## 2023-08-03 DIAGNOSIS — K208 Other esophagitis without bleeding: Secondary | ICD-10-CM | POA: Diagnosis not present

## 2023-08-03 DIAGNOSIS — K2970 Gastritis, unspecified, without bleeding: Secondary | ICD-10-CM | POA: Diagnosis not present

## 2023-08-03 HISTORY — PX: FOREIGN BODY REMOVAL ESOPHAGEAL: SHX5322

## 2023-08-03 HISTORY — PX: ESOPHAGOGASTRODUODENOSCOPY (EGD) WITH PROPOFOL: SHX5813

## 2023-08-03 HISTORY — PX: BIOPSY: SHX5522

## 2023-08-03 LAB — CBC WITH DIFFERENTIAL/PLATELET
Abs Immature Granulocytes: 0.05 10*3/uL (ref 0.00–0.07)
Basophils Absolute: 0 10*3/uL (ref 0.0–0.1)
Basophils Relative: 1 %
Eosinophils Absolute: 0.1 10*3/uL (ref 0.0–0.5)
Eosinophils Relative: 2 %
HCT: 37.4 % — ABNORMAL LOW (ref 39.0–52.0)
Hemoglobin: 12.9 g/dL — ABNORMAL LOW (ref 13.0–17.0)
Immature Granulocytes: 1 %
Lymphocytes Relative: 28 %
Lymphs Abs: 1.7 10*3/uL (ref 0.7–4.0)
MCH: 37.1 pg — ABNORMAL HIGH (ref 26.0–34.0)
MCHC: 34.5 g/dL (ref 30.0–36.0)
MCV: 107.5 fL — ABNORMAL HIGH (ref 80.0–100.0)
Monocytes Absolute: 0.9 10*3/uL (ref 0.1–1.0)
Monocytes Relative: 15 %
Neutro Abs: 3.3 10*3/uL (ref 1.7–7.7)
Neutrophils Relative %: 53 %
Platelets: 144 10*3/uL — ABNORMAL LOW (ref 150–400)
RBC: 3.48 MIL/uL — ABNORMAL LOW (ref 4.22–5.81)
RDW: 12.5 % (ref 11.5–15.5)
WBC: 6.1 10*3/uL (ref 4.0–10.5)
nRBC: 0 % (ref 0.0–0.2)

## 2023-08-03 LAB — BASIC METABOLIC PANEL
Anion gap: 9 (ref 5–15)
BUN: 27 mg/dL — ABNORMAL HIGH (ref 8–23)
CO2: 21 mmol/L — ABNORMAL LOW (ref 22–32)
Calcium: 9.4 mg/dL (ref 8.9–10.3)
Chloride: 109 mmol/L (ref 98–111)
Creatinine, Ser: 1.02 mg/dL (ref 0.61–1.24)
GFR, Estimated: >60 mL/min (ref >60)
Glucose, Bld: 133 mg/dL — ABNORMAL HIGH (ref 70–99)
Potassium: 3.9 mmol/L (ref 3.5–5.1)
Sodium: 139 mmol/L (ref 135–145)

## 2023-08-03 SURGERY — ESOPHAGOGASTRODUODENOSCOPY (EGD) WITH PROPOFOL
Anesthesia: General

## 2023-08-03 MED ORDER — SODIUM CHLORIDE 0.9 % IV SOLN: INTRAVENOUS | Status: DC | PRN

## 2023-08-03 MED ORDER — LORAZEPAM 2 MG/ML IJ SOLN
1.0000 mg | Freq: Once | INTRAMUSCULAR | Status: AC
Start: 2023-08-03 — End: 2023-08-03
  Administered 2023-08-03: 1 mg via INTRAVENOUS
  Filled 2023-08-03: qty 1

## 2023-08-03 MED ORDER — METOCLOPRAMIDE HCL 5 MG/ML IJ SOLN
10.0000 mg | Freq: Once | INTRAMUSCULAR | Status: AC
Start: 2023-08-03 — End: 2023-08-03
  Administered 2023-08-03: 10 mg via INTRAVENOUS
  Filled 2023-08-03: qty 2

## 2023-08-03 MED ORDER — PROPOFOL 10 MG/ML IV BOLUS
INTRAVENOUS | Status: DC | PRN
  Administered 2023-08-03: 30 mg via INTRAVENOUS
  Administered 2023-08-03: 100 mg via INTRAVENOUS

## 2023-08-03 MED ORDER — GLUCAGON HCL RDNA (DIAGNOSTIC) 1 MG IJ SOLR
1.0000 mg | Freq: Once | INTRAMUSCULAR | Status: AC
  Administered 2023-08-03: 1 mg via INTRAVENOUS
  Filled 2023-08-03: qty 1

## 2023-08-03 MED ORDER — LIDOCAINE 2% (20 MG/ML) 5 ML SYRINGE
INTRAMUSCULAR | Status: DC | PRN
  Administered 2023-08-03: 40 mg via INTRAVENOUS

## 2023-08-03 MED ORDER — PANTOPRAZOLE SODIUM 40 MG PO TBEC: 40.0000 mg | DELAYED_RELEASE_TABLET | Freq: Two times a day (BID) | ORAL | 1 refills | Status: DC

## 2023-08-03 MED ORDER — DIPHENHYDRAMINE HCL 50 MG/ML IJ SOLN
25.0000 mg | Freq: Once | INTRAMUSCULAR | Status: AC
Start: 2023-08-03 — End: 2023-08-03
  Administered 2023-08-03: 25 mg via INTRAVENOUS
  Filled 2023-08-03: qty 1

## 2023-08-03 MED ORDER — SUCCINYLCHOLINE CHLORIDE 200 MG/10ML IV SOSY
PREFILLED_SYRINGE | INTRAVENOUS | Status: DC | PRN
  Administered 2023-08-03: 120 mg via INTRAVENOUS

## 2023-08-03 SURGICAL SUPPLY — 14 items

## 2023-08-03 NOTE — Transfer of Care (Signed)
Immediate Anesthesia Transfer of Care Note  Patient: Council Pencek.  Procedure(s) Performed: ESOPHAGOGASTRODUODENOSCOPY (EGD) WITH PROPOFOL REMOVAL FOREIGN BODY ESOPHAGEAL BIOPSY  Patient Location: PACU  Anesthesia Type:General  Level of Consciousness: awake and alert   Airway & Oxygen Therapy: Patient Spontanous Breathing and Patient connected to nasal cannula oxygen  Post-op Assessment: Report given to RN and Post -op Vital signs reviewed and stable  Post vital signs: Reviewed and stable  Last Vitals:  Vitals Value Taken Time  BP 129/79 2217  Temp 98 2217  Pulse 78 08/03/23 2217  Resp 21 08/03/23 2217  SpO2 100 % 08/03/23 2217  Vitals shown include unfiled device data.  Last Pain:  0/10 pain 2217     Complications: No notable events documented.

## 2023-08-03 NOTE — Anesthesia Postprocedure Evaluation (Signed)
Anesthesia Post Note  Patient: Norman Hudson.  Procedure(s) Performed: ESOPHAGOGASTRODUODENOSCOPY (EGD) WITH PROPOFOL REMOVAL FOREIGN BODY ESOPHAGEAL BIOPSY     Patient location during evaluation: PACU Anesthesia Type: General Level of consciousness: awake and alert, patient cooperative and oriented Pain management: pain level controlled Vital Signs Assessment: post-procedure vital signs reviewed and stable Respiratory status: spontaneous breathing, nonlabored ventilation and respiratory function stable Cardiovascular status: blood pressure returned to baseline and stable Postop Assessment: no apparent nausea or vomiting Anesthetic complications: no   There were no known notable events for this encounter.  Last Vitals:  Vitals:   08/03/23 2131 08/03/23 2216  BP: (!) 150/80 129/79  Pulse: 75 78  Resp: 14 (!) 22  Temp: 36.7 C (!) 36.3 C  SpO2: 97% 96%    Last Pain:  Vitals:   08/03/23 2216  TempSrc: Tympanic  PainSc: 2                  Ronaldo Crilly,E. Brodrick Curran

## 2023-08-03 NOTE — ED Triage Notes (Signed)
PT BIB [facility] from home for food bolus. Pt states he got a piece of tenderloin stuck in his throat around an hour ago.  Reports this is the 2nd time in 2 months, last time had to have procedure to remove food bolus.  Reports unable to swallow his own spit but no difficulty breathing at time of triage. Denies any other S/S at triage.

## 2023-08-03 NOTE — ED Notes (Signed)
Provider advised pt to be transferred to [facility]. Provider called accepting doctor Scheving. EMT-P called report on pt to charge RN. Due to delay in inter-facility transport, provider advised pt to transport POV. Pt son and wife transported pt. Pt ambulatory to POV.

## 2023-08-03 NOTE — Anesthesia Preprocedure Evaluation (Addendum)
Anesthesia Evaluation  Patient identified by MRN, date of birth, ID band Patient awake    Reviewed: Allergy & Precautions, NPO status , Patient's Chart, lab work & pertinent test results  History of Anesthesia Complications Negative for: history of anesthetic complications  Airway Mallampati: II  TM Distance: >3 FB Neck ROM: Full    Dental  (+) Dental Advisory Given   Pulmonary sleep apnea and Continuous Positive Airway Pressure Ventilation , former smoker   breath sounds clear to auscultation       Cardiovascular hypertension, Pt. on medications (-) angina + dysrhythmias Atrial Fibrillation  Rhythm:Regular Rate:Normal  08/2022 ECHO:   Normal LV systolic function, EF 60-65%. Left ventricle cavity  is normal in size. Normal left ventricular wall thickness. Normal global  wall motion. Normal diastolic filling pattern, normal LAP.  Aortic valve sclerosis without stenosis.  Mild tricuspid regurgitation. No evidence of pulmonary hypertension.     Neuro/Psych   Anxiety     TIA   GI/Hepatic Neg liver ROS,,,Esophageal stricture: food impaction   Endo/Other  Hypothyroidism    Renal/GU H/o stones     Musculoskeletal   Abdominal   Peds  Hematology Eliquis Hb 12.9, plt 144k   Anesthesia Other Findings   Reproductive/Obstetrics                             Anesthesia Physical Anesthesia Plan  ASA: 3 and emergent  Anesthesia Plan: General   Post-op Pain Management: Minimal or no pain anticipated   Induction: Intravenous and Rapid sequence  PONV Risk Score and Plan: 2 and Ondansetron and Dexamethasone  Airway Management Planned: Oral ETT  Additional Equipment: None  Intra-op Plan:   Post-operative Plan: Extubation in OR  Informed Consent: I have reviewed the patients History and Physical, chart, labs and discussed the procedure including the risks, benefits and alternatives for the  proposed anesthesia with the patient or authorized representative who has indicated his/her understanding and acceptance.     Dental advisory given  Plan Discussed with: CRNA and Surgeon  Anesthesia Plan Comments:         Anesthesia Quick Evaluation

## 2023-08-03 NOTE — ED Notes (Signed)
Pt to Endo 

## 2023-08-03 NOTE — Anesthesia Procedure Notes (Signed)
Procedure Name: Intubation Date/Time: 08/03/2023 9:55 PM  Performed by: Flonnie Hailstone, CRNAPre-anesthesia Checklist: Patient identified, Emergency Drugs available, Suction available and Patient being monitored Patient Re-evaluated:Patient Re-evaluated prior to induction Oxygen Delivery Method: Circle system utilized Preoxygenation: Pre-oxygenation with 100% oxygen Induction Type: IV induction, Rapid sequence and Cricoid Pressure applied Laryngoscope Size: Glidescope and 4 Grade View: Grade I Tube type: Oral Tube size: 7.5 mm Number of attempts: 1 Airway Equipment and Method: Stylet Placement Confirmation: ETT inserted through vocal cords under direct vision, positive ETCO2 and breath sounds checked- equal and bilateral Secured at: 24 cm Tube secured with: Tape Dental Injury: Teeth and Oropharynx as per pre-operative assessment  Comments: intubated by Samul Dada, CRNA; ebbs

## 2023-08-03 NOTE — ED Provider Notes (Signed)
[place] EMERGENCY DEPARTMENT AT [facility] Provider Note   CSN: [id number] Arrival date & time: 08/03/23  1925     History  Chief Complaint  Patient presents with   Swallowed Foreign Body    Norman Hudson. is a 72 y.o. male here presenting with food impaction.  Patient states that around 6:15 PM, he tries to eat beef tenderloin and felt it stuck in his chest.  He states that he is unable to tolerate secretions since then.  He states that in October, he had food impaction that required endoscopy and was found to have esophageal stricture.  Patient has been eating soft diet until today.  The history is provided by the patient.  Swallowed Foreign Body       Home Medications Prior to Admission medications   Medication Sig Start Date End Date Taking? Authorizing Provider  acetaminophen (TYLENOL) 325 MG tablet Take 650 mg by mouth every 6 (six) hours as needed for moderate pain.    [provider]  cholecalciferol (VITAMIN D3) 25 MCG (1000 UT) tablet Take 1,000 Units by mouth daily.    [provider]  clobetasol ointment (TEMOVATE) 0.05 % Apply 1 Application topically 2 (two) times daily as needed (itching). 05/14/21   [provider]  clonazePAM (KLONOPIN) 0.5 MG tablet TAKE ONE TABLET BY MOUTH AT BEDTIME 07/05/23   Ihor Austin, NP  dronedarone (MULTAQ) 400 MG tablet Take 1 tablet (400 mg total) by mouth 2 (two) times daily with a meal. 07/01/23 09/29/23  Tolia, Sunit, DO  ELIQUIS 5 MG TABS tablet TAKE ONE TABLET BY MOUTH TWICE DAILY 04/08/23   Tolia, Sunit, DO  hydrochlorothiazide (HYDRODIURIL) 25 MG tablet Take 1 tablet (25 mg total) by mouth every morning. 06/03/23   Tolia, Sunit, DO  metFORMIN (GLUCOPHAGE) 500 MG tablet Take 500 mg by mouth 2 (two) times daily with a meal.    [provider]  metoprolol succinate (TOPROL-XL) 25 MG 24 hr tablet Take 1 tablet (25 mg total) by mouth every morning. 06/03/23   Tolia,  Sunit, DO  Multiple Vitamins-Minerals (MULTIVITAMIN WITH MINERALS) tablet Take 1 tablet by mouth daily.    [provider]  Multiple Vitamins-Minerals (PRESERVISION AREDS 2 PO) Take 1 capsule by mouth daily.    [provider]  olmesartan (BENICAR) 40 MG tablet Take 1 tablet (40 mg total) by mouth daily at 10 pm. 06/03/23   Tolia, Sunit, DO  pyridOXINE (VITAMIN B-6) 100 MG tablet Take 100 mg by mouth daily.    [provider]  rosuvastatin (CRESTOR) 20 MG tablet Take 1 tablet (20 mg total) by mouth daily. 02/01/23   Tolia, Sunit, DO  spironolactone (ALDACTONE) 25 MG tablet Take 1 tablet (25 mg total) by mouth daily. 05/26/23   Tolia, Sunit, DO  SYNTHROID 88 MCG tablet Take 88 mcg by mouth daily before breakfast. 06/11/22   [provider]  valACYclovir (VALTREX) 1000 MG tablet Take 1,000 mg by mouth daily as needed (Herpes flare).    [provider]      Allergies    Bactrim [sulfamethoxazole-trimethoprim]    Review of Systems   Review of Systems  HENT:  Positive for trouble swallowing.   All other systems reviewed and are negative.   Physical Exam Updated Vital Signs BP 139/88   Pulse 73   Temp 97.9 F (36.6 C)   Resp 18   Ht 5\' 10"  (1.778 m)   Wt 92.1 kg   SpO2 100%  BMI 29.13 kg/m  Physical Exam Vitals and nursing note reviewed.  Constitutional:      Comments: Not tolerating secretions and patient is drooling  HENT:     Head: Normocephalic.     Nose: Nose normal.     Mouth/Throat:     Comments: No foreign body visualized in the posterior pharynx Eyes:     Extraocular Movements: Extraocular movements intact.     Pupils: Pupils are equal, round, and reactive to light.  Cardiovascular:     Rate and Rhythm: Normal rate and regular rhythm.     Pulses: Normal pulses.     Heart sounds: Normal heart sounds.  Pulmonary:     Effort: Pulmonary effort is normal.     Breath sounds: Normal breath sounds.  Abdominal:     General:  Abdomen is flat.     Palpations: Abdomen is soft.  Musculoskeletal:        General: Normal range of motion.     Cervical back: Normal range of motion and neck supple.  Skin:    General: Skin is warm.     Capillary Refill: Capillary refill takes less than 2 seconds.  Neurological:     General: No focal deficit present.     Mental Status: He is oriented to person, place, and time.  Psychiatric:        Mood and Affect: Mood normal.        Behavior: Behavior normal.     ED Results / Procedures / Treatments   Labs (all labs ordered are listed, but only abnormal results are displayed) Labs Reviewed  CBC WITH DIFFERENTIAL/PLATELET - Abnormal; Notable for the following components:      Result Value   RBC 3.48 (*)    Hemoglobin 12.9 (*)    HCT 37.4 (*)    MCV 107.5 (*)    MCH 37.1 (*)    Platelets 144 (*)    All other components within normal limits  BASIC METABOLIC PANEL    EKG None  Radiology DG Chest Port 1 View Result Date: 08/03/2023 CLINICAL DATA:  Food impaction EXAM: PORTABLE CHEST 1 VIEW COMPARISON:  Chest radiograph dated 07/12/2016 FINDINGS: No radiopaque foreign body. Normal lung volumes. No focal consolidations. No pleural effusion or pneumothorax. The heart size and mediastinal contours are within normal limits. No acute osseous abnormality. IMPRESSION: 1. No radiopaque foreign body. 2. No acute disease. Electronically Signed   By: Agustin Cree M.D.   On: 08/03/2023 20:04    Procedures Procedures    Medications Ordered in ED Medications  metoCLOPramide (REGLAN) injection 10 mg (10 mg Intravenous Given 08/03/23 1949)  diphenhydrAMINE (BENADRYL) injection 25 mg (25 mg Intravenous Given 08/03/23 1949)  glucagon (human recombinant) (GLUCAGEN) injection 1 mg (1 mg Intravenous Given 08/03/23 1949)  LORazepam (ATIVAN) injection 1 mg (1 mg Intravenous Given 08/03/23 1949)    ED Course/ Medical Decision Making/ A&P                                 Medical Decision  Making Norman Kindschi Brenton Hoaglin. is a 72 y.o. male here presenting with possible food impaction.  Patient ate some beef tenderloin and felt it stuck in his chest.  Patient has a history of esophageal stricture that required dilation and had a recent food impaction with beef as well.  Concern for food impaction.  Plan to check basic blood work and give glucagon and Reglan  and Ativan and reassess  8:25 PM Patient is unable to tolerate p.o. still.  Labs unremarkable chest x-ray did not show any obvious foreign body.  I discussed with Dr. Levora Angel from GI.  He will arrange for endoscopy.  8:36 PM GI called back and states that there is no anesthesia available at [facility].  Recommend ED to ED transfer to [facility] and he will perform endoscopy. Patient will be accepted by Dr. Suezanne Jacquet. Will transfer by POV   Problems Addressed: Food impaction of esophagus, initial encounter: acute illness or injury  Amount and/or Complexity of Data Reviewed Labs: ordered. Decision-making details documented in ED Course. Radiology: ordered and independent interpretation performed. Decision-making details documented in ED Course.  Risk Prescription drug management.    Final Clinical Impression(s) / ED Diagnoses Final diagnoses:  None    Rx / DC Orders ED Discharge Orders     None         Charlynne Pander, MD 08/03/23 2036

## 2023-08-03 NOTE — Discharge Instructions (Addendum)

## 2023-08-03 NOTE — Op Note (Signed)
[facility] Patient Name: Norman Hudson Procedure Date : 08/03/2023 MRN: [id number] Attending MD: Kathi Der , MD, [id number] Date of Birth: 1950-11-26 CSN: [id number] Age: 72 Admit Type: Emergency Department Procedure:                Upper GI endoscopy Indications:              Dysphagia, Foreign body in the esophagus Providers:                Kathi Der, MD, Stephens Shire RN, RN, Marja Kays, Technician Referring MD:              Medicines:                General Anesthesia Complications:            No immediate complications. Estimated Blood Loss:     Estimated blood loss was minimal. Procedure:                Pre-Anesthesia Assessment:                           - Prior to the procedure, a History and Physical                            was performed, and patient medications and                            allergies were reviewed. The patient's tolerance of                            previous anesthesia was also reviewed. The risks                            and benefits of the procedure and the sedation                            options and risks were discussed with the patient.                            All questions were answered, and informed consent                            was obtained. Prior Anticoagulants: The patient has                            taken Eliquis (apixaban), last dose was day of                            procedure. ASA Grade Assessment: III - A patient                            with severe systemic disease. After reviewing the  risks and benefits, the patient was deemed in                            satisfactory condition to undergo the procedure.                           After obtaining informed consent, the endoscope was                            passed under direct vision. Throughout the                            procedure, the patient's blood pressure, pulse,  and                            oxygen saturations were monitored continuously. The                            GIF-H190 ([id number]) Olympus endoscope was introduced                            through the mouth, and advanced to the second part                            of duodenum. The upper GI endoscopy was performed                            with moderate difficulty due to presence of food.                            The patient tolerated the procedure well. Scope In: Scope Out: Findings:      Food was found in the middle third of the esophagus. Removal was       accomplished with a rat-toothed forceps.      LA Grade D (one or more mucosal breaks involving at least 75% of       esophageal circumference) esophagitis with no bleeding was found in the       entire esophagus.      One benign-appearing, intrinsic moderate (circumferential scarring or       stenosis; an endoscope may pass) stenosis was found in the mid       esophagus. The stenosis was traversed.      One benign-appearing, intrinsic moderate (circumferential scarring or       stenosis; an endoscope may pass) stenosis was found at the       gastroesophageal junction. The stenosis was traversed.      A hiatal hernia was present.      Patchy mildly erythematous mucosa was found in the prepyloric region of       the stomach. Biopsies were taken with a cold forceps for histology.      The cardia and gastric fundus were normal on retroflexion.      Patchy mildly erythematous mucosa was found in the duodenal bulb.      The first portion of the duodenum and second portion of the duodenum       were normal. Impression:               -  Food in the middle third of the esophagus.                            Removal was successful.                           - LA Grade D erosive esophagitis with no bleeding.                           - Benign-appearing esophageal stenosis.                           - Benign-appearing esophageal  stenosis.                           - Hiatal hernia.                           - Erythematous mucosa in the prepyloric region of                            the stomach. Biopsied.                           - Erythematous duodenopathy.                           - Normal first portion of the duodenum and second                            portion of the duodenum. Moderate Sedation:      CRNA Recommendation:           - Patient has a contact number available for                            emergencies. The signs and symptoms of potential                            delayed complications were discussed with the                            patient. Return to normal activities tomorrow.                            Written discharge instructions were provided to the                            patient.                           - Soft diet.                           - Continue present medications.                           - Await pathology results.                           -  Use Protonix (pantoprazole) 40 mg PO BID.                           - Repeat upper endoscopy in 2 months.                           - Return to GI office in 2 months. Procedure Code(s):        --- Professional ---                           [phone], Esophagogastroduodenoscopy, flexible,                            transoral; with removal of foreign body(s)                           43239, Esophagogastroduodenoscopy, flexible,                            transoral; with biopsy, single or multiple Diagnosis Code(s):        --- Professional ---                           U04.540J, Food in esophagus causing other injury,                            initial encounter                           K20.80, Other esophagitis without bleeding                           K22.2, Esophageal obstruction                           K44.9, Diaphragmatic hernia without obstruction or                            gangrene                           K31.89,  Other diseases of stomach and duodenum                           R13.10, Dysphagia, unspecified                           T18.108A, Unspecified foreign body in esophagus                            causing other injury, initial encounter CPT copyright 2022 American Medical Association. All rights reserved. The codes documented in this report are preliminary and upon coder review may  be revised to meet current compliance requirements. Kathi Der, MD Kathi Der, MD 08/03/2023 10:19:20 PM Number of Addenda: 0

## 2023-08-03 NOTE — Consult Note (Signed)
Referring Provider: ED Primary Care Physician:  Shon Hale, MD Primary Gastroenterologist:  Deboraha Sprang PCP  Reason for Consultation:    HPI: Norman Davin. is a 72 y.o. male with past medical history of atrial fibrillation on Eliquis, remote history of TIA, history of esophageal stricture requiring urgent endoscopy in October 2024 for food impaction present to the hospital again with food impaction.  He was eating beef tenderloin this evening which got stuck in esophagus.  Not able to eat or drink since then.  Not able to swallow his own secretions.  Past Medical History:  Diagnosis Date   Acne    Anxiety    takes Xanax daily as needed   Arthritis    Atrial flutter, paroxysmal (HCC)    History of kidney stones    Hyperlipidemia    takes Pravastatin daily   Hypertension    takes Amlodipine and Valsartan-HCTZ daily   Hypothyroidism    takes Synthroid daily   Inguinal hernia    left side   Internal hemorrhoids    Joint pain    Kidney stones    Pre-diabetes    Rosacea    TIA (transient ischemic attack) 06/2019    Past Surgical History:  Procedure Laterality Date   CARDIOVERSION N/A 08/28/2022   Procedure: CARDIOVERSION;  Surgeon: Yates Decamp, MD;  Location: [facility];  Service: Cardiovascular;  Laterality: N/A;   CARDIOVERSION N/A 08/29/2022   Procedure: CARDIOVERSION;  Surgeon: Yates Decamp, MD;  Location: [facility];  Service: Cardiovascular;  Laterality: N/A;   COLONOSCOPY     ESOPHAGOGASTRODUODENOSCOPY N/A 05/29/2023   Procedure: ESOPHAGOGASTRODUODENOSCOPY (EGD);  Surgeon: Kerin Salen, MD;  Location: [facility] ENDOSCOPY;  Service: Gastroenterology;  Laterality: N/A;   EXTRACORPOREAL SHOCK WAVE LITHOTRIPSY Right 12/27/2019   Procedure: EXTRACORPOREAL SHOCK WAVE LITHOTRIPSY (ESWL);  Surgeon: Malen Gauze, MD;  Location: [facility];  Service: Urology;  Laterality: Right;   IMPACTION REMOVAL  05/29/2023   Procedure: IMPACTION REMOVAL;  Surgeon: Kerin Salen, MD;  Location: [facility] ENDOSCOPY;  Service: Gastroenterology;;  Food removed with roth net   IR US GUIDE BX ASP/DRAIN  09/06/2022   LITHOTRIPSY  09/2015   LUMBAR LAMINECTOMY WITH COFLEX 1 LEVEL N/A 07/07/2015   Procedure: LUMBAR FOUR-FIVE LUMBAR LAMINECTOMY WITH COFLEX;  Surgeon: Tia Alert, MD;  Location: [facility] NEURO ORS;  Service: Neurosurgery;  Laterality: N/A;   TONSILLECTOMY     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 HEAD AND LINER EXCHANGE. POSSIBLE CUP REVISION;  Surgeon: Joen Laura, MD;  Location: [facility];  Service: Orthopedics;  Laterality: Right;    Prior to Admission medications   Medication Sig Start Date End Date Taking? Authorizing Provider  clonazePAM (KLONOPIN) 0.5 MG tablet TAKE ONE TABLET BY MOUTH AT BEDTIME 07/05/23  Yes McCue, Shanda Bumps, NP  dronedarone (MULTAQ) 400 MG tablet Take 1 tablet (400 mg total) by mouth 2 (two) times daily with a meal. 07/01/23 09/29/23 Yes Tolia, Sunit, DO  ELIQUIS 5 MG TABS tablet TAKE ONE TABLET BY MOUTH TWICE DAILY 04/08/23  Yes Tolia, Sunit, DO  hydrochlorothiazide (HYDRODIURIL) 25 MG tablet Take 1 tablet (25 mg total) by  mouth every morning. 06/03/23  Yes Tolia, Sunit, DO  metFORMIN (GLUCOPHAGE) 500 MG tablet Take 500 mg by mouth 2 (two) times daily with a meal.   Yes [provider]  metoprolol succinate (TOPROL-XL) 25 MG 24 hr tablet Take 1 tablet (25 mg total) by mouth every morning. 06/03/23  Yes Tolia, Sunit, DO  olmesartan (BENICAR) 40 MG tablet Take 1 tablet (40 mg total) by mouth daily at 10 pm. 06/03/23  Yes Tolia, Sunit, DO  rosuvastatin (CRESTOR) 20 MG  tablet Take 1 tablet (20 mg total) by mouth daily. 02/01/23  Yes Tolia, Sunit, DO  spironolactone (ALDACTONE) 25 MG tablet Take 1 tablet (25 mg total) by mouth daily. 05/26/23  Yes Tolia, Sunit, DO  SYNTHROID 88 MCG tablet Take 88 mcg by mouth daily before breakfast. 06/11/22  Yes [provider]  acetaminophen (TYLENOL) 325 MG tablet Take 650 mg by mouth every 6 (six) hours as needed for moderate pain.    [provider]  cholecalciferol (VITAMIN D3) 25 MCG (1000 UT) tablet Take 1,000 Units by mouth daily.    [provider]  clobetasol ointment (TEMOVATE) 0.05 % Apply 1 Application topically 2 (two) times daily as needed (itching). 05/14/21   [provider]  Multiple Vitamins-Minerals (MULTIVITAMIN WITH MINERALS) tablet Take 1 tablet by mouth daily.    [provider]  Multiple Vitamins-Minerals (PRESERVISION AREDS 2 PO) Take 1 capsule by mouth daily.    [provider]  pyridOXINE (VITAMIN B-6) 100 MG tablet Take 100 mg by mouth daily.    [provider]  valACYclovir (VALTREX) 1000 MG tablet Take 1,000 mg by mouth daily as needed (Herpes flare).    [provider]    Scheduled Meds: Continuous Infusions: PRN Meds:.  Allergies as of 08/03/2023 - Review Complete 08/03/2023  Allergen Reaction Noted   Bactrim [sulfamethoxazole-trimethoprim] Hives and Itching 04/10/2019    Family History  Problem Relation Age of Onset   Hypertension Father    Sleep disorder Father    CVA Father    Cancer Maternal Grandfather    Hypertension Paternal Grandmother    Diabetes Paternal Grandmother    Alcoholism Mother    Hypertension Sister    Aneurysm Sister    Atrial fibrillation Sister    Other Neg Hx        hypogonadism   Sleep apnea Neg Hx     Social History   Socioeconomic History   Marital status: Married    Spouse name: Not on file   Number of children: 2   Years of education: Not on file   Highest education level:  Bachelor's degree (e.g., BA, AB, BS)  Occupational History   Not on file  Tobacco Use   Smoking status: Former    Current packs/day: 0.00    Average packs/day: 1 pack/day for 15.0 years (15.0 ttl pk-yrs)    Types: Cigarettes    Start date: 02/12/1974    Quit date: 02/12/1989    Years since quitting: 34.4   Smokeless tobacco: Never   Tobacco comments:    quit smoking in 1991  Vaping Use   Vaping status: Never Used  Substance and Sexual Activity   Alcohol use: No   Drug use: No   Sexual activity: Not on file  Other Topics Concern   Not on file  Social History Narrative   Lives at home with wife   4 cups of caffeine/day   Social Drivers of Corporate investment banker Strain:  Not on file  Food Insecurity: No Food Insecurity (09/03/2022)   Hunger Vital Sign    Worried About Running Out of Food in the Last Year: Never true    Ran Out of Food in the Last Year: Never true  Transportation Needs: No Transportation Needs (09/03/2022)   PRAPARE - Administrator, Civil Service (Medical): No    Lack of Transportation (Non-Medical): No  Physical Activity: Not on file  Stress: Not on file  Social Connections: Not on file  Intimate Partner Violence: Not At Risk (09/03/2022)   Humiliation, Afraid, Rape, and Kick questionnaire    Fear of Current or Ex-Partner: No    Emotionally Abused: No    Physically Abused: No    Sexually Abused: No    Review of Systems: All negative except as stated above in HPI.  Physical Exam: Vital signs: Vitals:   08/03/23 2103 08/03/23 2131  BP: (!) 145/82 (!) 150/80  Pulse: 79 75  Resp: 18 14  Temp: 97.8 F (36.6 C) 98.1 F (36.7 C)  SpO2: 100% 97%     General:   Alert,  Well-developed, well-nourished, pleasant and cooperative in NAD Lungs: No visible respiratory distress Heart:  Regular rate and rhythm; no murmurs, clicks, rubs,  or gallops. Abdomen: Soft, nontender, nondistended, bowel sound present, no peritoneal signs Rectal:   Deferred  GI:  Lab Results: Recent Labs    08/03/23 1947  WBC 6.1  HGB 12.9*  HCT 37.4*  PLT 144*   BMET Recent Labs    08/03/23 1947  NA 139  K 3.9  CL 109  CO2 21*  GLUCOSE 133*  BUN 27*  CREATININE 1.02  CALCIUM 9.4   LFT No results for input(s): "PROT", "ALBUMIN", "AST", "ALT", "ALKPHOS", "BILITOT", "BILIDIR", "IBILI" in the last 72 hours. PT/INR No results for input(s): "LABPROT", "INR" in the last 72 hours.   Studies/Results: DG Chest Port 1 View Result Date: 08/03/2023 CLINICAL DATA:  Food impaction EXAM: PORTABLE CHEST 1 VIEW COMPARISON:  Chest radiograph dated 07/12/2016 FINDINGS: No radiopaque foreign body. Normal lung volumes. No focal consolidations. No pleural effusion or pneumothorax. The heart size and mediastinal contours are within normal limits. No acute osseous abnormality. IMPRESSION: 1. No radiopaque foreign body. 2. No acute disease. Electronically Signed   By: Agustin Cree M.D.   On: 08/03/2023 20:04    Impression/Plan: -Food impaction -Esophageal stricture.  Not on any PPI. -Paroxysmal atrial fibrillation.  Last dose of Eliquis today morning.  Recommendations ------------------------- -Proceed with urgent EGD. -He will likely need to be on soft diet for extended period of time until we can arrange for elective esophageal dilation after being off of his anticoagulation.  Risks (bleeding, infection, bowel perforation that could require surgery, sedation-related changes in cardiopulmonary systems), benefits (identification and possible treatment of source of symptoms, exclusion of certain causes of symptoms), and alternatives (watchful waiting, radiographic imaging studies, empiric medical treatment)  were explained to patient/family in detail and patient wishes to proceed.     LOS: 0 days   Kathi Der  MD, FACP 08/03/2023, 9:46 PM  Contact #  [phone]

## 2023-08-04 ENCOUNTER — Encounter (HOSPITAL_COMMUNITY): Payer: Self-pay | Admitting: Gastroenterology

## 2023-08-04 DIAGNOSIS — T18128A Food in esophagus causing other injury, initial encounter: Secondary | ICD-10-CM | POA: Diagnosis not present

## 2023-08-05 LAB — SURGICAL PATHOLOGY

## 2023-08-19 ENCOUNTER — Telehealth: Payer: Self-pay | Admitting: *Deleted

## 2023-08-19 NOTE — Telephone Encounter (Signed)
 Patient with diagnosis of afib on Eliquis  for anticoagulation.    Procedure: Endoscopy Date of procedure: 08/30/23   CHA2DS2-VASc Score = 4   This indicates a 4.8% annual risk of stroke. The patient's score is based upon: CHF History: 0 HTN History: 1 Diabetes History: 0 Stroke History: 2 Vascular Disease History: 0 Age Score: 1 Gender Score: 0      CrCl 71 ml/min Platelet count 144  Per office protocol, patient can hold Eliquis  for 2 days prior to procedure.    **This guidance is not considered finalized until pre-operative APP has relayed final recommendations.**

## 2023-08-19 NOTE — Telephone Encounter (Signed)
   Pre-operative Risk Assessment    Patient Name: Norman Jacob Mundt Jr.  DOB: 02/22/1951 MRN: [id number]   Date of last office visit: 03/28/2023 Date of next office visit: 10/17/2023   Request for Surgical Clearance    Procedure:   Endoscopy  Date of Surgery:  Clearance 08/30/23                                 Surgeon:  Dr. Jerrell Sol Surgeon's Group or Practice Name:  [facility] Phone number:  [phone] Fax number:  [phone]   Type of Clearance Requested:   - Medical  - Pharmacy:  Hold Apixaban  (Eliquis ) Not Indicated.   Type of Anesthesia:   Propofol    Additional requests/questions:    Signed, Edsel Grayce Sanders   08/19/2023, 10:47 AM

## 2023-08-22 ENCOUNTER — Telehealth: Payer: Self-pay | Admitting: *Deleted

## 2023-08-22 NOTE — Telephone Encounter (Signed)
 Pt has been scheduled for a tele visit, 08/26/23 9:00.  Consent on file / medications reconciled.

## 2023-08-22 NOTE — Telephone Encounter (Signed)
   Name: Norman Jacob Legendre Jr.  DOB: 1951/03/19  MRN: [id number]  Primary Cardiologist: None   Preoperative team, please contact this patient and set up a phone call appointment for further preoperative risk assessment. Please obtain consent and complete medication review. Thank you for your help.  I confirm that guidance regarding antiplatelet and oral anticoagulation therapy has been completed and, if necessary, noted below.  Patient can hold Eliquis  2 days prior to procedure.  I also confirmed the patient resides in the state of [place] . As per [facility] Medical Board telemedicine laws, the patient must reside in the state in which the provider is licensed.   Wyn Raddle, Jackee Shove, NP 08/22/2023, 8:08 AM [facility]

## 2023-08-22 NOTE — Telephone Encounter (Signed)
 Pt has been scheduled for a tele visit, 08/26/23 9:00.  Consent on file / medications reconciled.     Patient Consent for Virtual Visit        Norman Jacob Mckiver Jr. has provided verbal consent on 08/22/2023 for a virtual visit (video or telephone).   CONSENT FOR VIRTUAL VISIT FOR:  Norman Jacob Ishman Jr.  By participating in this virtual visit I agree to the following:  I hereby voluntarily request, consent and authorize [facility] and its employed or contracted physicians, physician assistants, nurse practitioners or other licensed health care professionals (the Practitioner), to provide me with telemedicine health care services (the "Services) as deemed necessary by the treating Practitioner. I acknowledge and consent to receive the Services by the Practitioner via telemedicine. I understand that the telemedicine visit will involve communicating with the Practitioner through live audiovisual communication technology and the disclosure of certain medical information by electronic transmission. I acknowledge that I have been given the opportunity to request an in-person assessment or other available alternative prior to the telemedicine visit and am voluntarily participating in the telemedicine visit.  I understand that I have the right to withhold or withdraw my consent to the use of telemedicine in the course of my care at any time, without affecting my right to future care or treatment, and that the Practitioner or I may terminate the telemedicine visit at any time. I understand that I have the right to inspect all information obtained and/or recorded in the course of the telemedicine visit and may receive copies of available information for a reasonable fee.  I understand that some of the potential risks of receiving the Services via telemedicine include:  Delay or interruption in medical evaluation due to technological equipment failure or disruption; Information  transmitted may not be sufficient (e.g. poor resolution of images) to allow for appropriate medical decision making by the Practitioner; and/or  In rare instances, security protocols could fail, causing a breach of personal health information.  Furthermore, I acknowledge that it is my responsibility to provide information about my medical history, conditions and care that is complete and accurate to the best of my ability. I acknowledge that Practitioner's advice, recommendations, and/or decision may be based on factors not within their control, such as incomplete or inaccurate data provided by me or distortions of diagnostic images or specimens that may result from electronic transmissions. I understand that the practice of medicine is not an exact science and that Practitioner makes no warranties or guarantees regarding treatment outcomes. I acknowledge that a copy of this consent can be made available to me via my patient portal [facility] MyChart), or I can request a printed copy by calling the office of [facility].    I understand that my insurance will be billed for this visit.   I have read or had this consent read to me. I understand the contents of this consent, which adequately explains the benefits and risks of the Services being provided via telemedicine.  I have been provided ample opportunity to ask questions regarding this consent and the Services and have had my questions answered to my satisfaction. I give my informed consent for the services to be provided through the use of telemedicine in my medical care

## 2023-08-26 ENCOUNTER — Ambulatory Visit: Payer: Medicare HMO | Attending: Cardiovascular Disease | Admitting: Nurse Practitioner

## 2023-08-26 DIAGNOSIS — Z01810 Encounter for preprocedural cardiovascular examination: Secondary | ICD-10-CM | POA: Diagnosis not present

## 2023-08-26 NOTE — Progress Notes (Signed)
Virtual Visit via Telephone Note   Because of Norman Sobin Jr.'s co-morbid illnesses, he is at least at moderate risk for complications without adequate follow up.  This format is felt to be most appropriate for this patient at this time.  The patient did not have access to video technology/had technical difficulties with video requiring transitioning to audio format only (telephone).  All issues noted in this document were discussed and addressed.  No physical exam could be performed with this format.  Please refer to the patient's chart for his consent to telehealth for [facility].  Evaluation Performed:  Preoperative cardiovascular risk assessment _____________   Date:  08/26/2023   Patient ID:  Norman Lick., DOB Dec 15, 1950, MRN [id number] Patient Location:  Home Provider location:   Office  Primary Care Provider:  Shon Hale, MD Primary Cardiologist:  None  Chief Complaint / Patient Profile   73 y.o. y/o male with a h/o paroxysmal atrial fibrillation, TIA, hypertension, hyperlipidemia, and OSA who is pending endoscopy on 08/30/2023 with Dr. Charlott Rakes of [facility] and presents today for telephonic preoperative cardiovascular risk assessment.  History of Present Illness    Norman Huntress. is a 73 y.o. male who presents via audio/video conferencing for a telehealth visit today.  Pt was last seen in cardiology clinic on 03/28/2023 by Dr. Odis Hollingshead.  At that time Norman Lick. was doing well.  The patient is now pending procedure as outlined above. Since his last visit, he has done well from a cardiac standpoint.   He denies chest pain, palpitations, dyspnea, pnd, orthopnea, n, v, dizziness, syncope, edema, weight gain, or early satiety. All other systems reviewed and are otherwise negative except as noted above.   Past Medical History    Past Medical History:  Diagnosis Date   Acne    Anxiety    takes Xanax  daily as needed   Arthritis    Atrial flutter, paroxysmal (HCC)    History of kidney stones    Hyperlipidemia    takes Pravastatin daily   Hypertension    takes Amlodipine and Valsartan-HCTZ daily   Hypothyroidism    takes Synthroid daily   Inguinal hernia    left side   Internal hemorrhoids    Joint pain    Kidney stones    Pre-diabetes    Rosacea    TIA (transient ischemic attack) 06/2019   Past Surgical History:  Procedure Laterality Date   BIOPSY  08/03/2023   Procedure: BIOPSY;  Surgeon: Kathi Der, MD;  [facility]: [facility];  Service: Gastroenterology;;   CARDIOVERSION N/A 08/28/2022   Procedure: CARDIOVERSION;  Surgeon: Yates Decamp, MD;  Location: [facility];  Service: Cardiovascular;  Laterality: N/A;   CARDIOVERSION N/A 08/29/2022   Procedure: CARDIOVERSION;  Surgeon: Yates Decamp, MD;  Location: [facility];  Service: Cardiovascular;  Laterality: N/A;   COLONOSCOPY     ESOPHAGOGASTRODUODENOSCOPY N/A 05/29/2023   Procedure: ESOPHAGOGASTRODUODENOSCOPY (EGD);  Surgeon: Kerin Salen, MD;  Location[facility] ENDOSCOPY;  Service: Gastroenterology;  Laterality: N/A;   ESOPHAGOGASTRODUODENOSCOPY (EGD) WITH PROPOFOL N/A 08/03/2023   Procedure: ESOPHAGOGASTRODUODENOSCOPY (EGD) WITH PROPOFOL;  Surgeon: Kathi Der, MD;  [facility];  Service: Gastroenterology;  Laterality: N/A;   EXTRACORPOREAL SHOCK WAVE LITHOTRIPSY Right 12/27/2019   Procedure: EXTRACORPOREAL SHOCK WAVE LITHOTRIPSY (ESWL);  Surgeon: Malen Gauze, MD;  Location: [facility];  Service: Urology;  Laterality: Right;   FOREIGN BODY REMOVAL ESOPHAGEAL  08/03/2023   Procedure:  REMOVAL FOREIGN BODY ESOPHAGEAL;  Surgeon: Kathi Der, MD;  [facility];  Service: Gastroenterology;;   IMPACTION REMOVAL  05/29/2023   Procedure: IMPACTION REMOVAL;  Surgeon: Kerin Salen, MD;  Location: [facility] ENDOSCOPY;  Service: Gastroenterology;;  Food removed with roth net   IR US GUIDE BX ASP/DRAIN   09/06/2022   LITHOTRIPSY  09/2015   LUMBAR LAMINECTOMY WITH COFLEX 1 LEVEL N/A 07/07/2015   Procedure: LUMBAR FOUR-FIVE LUMBAR LAMINECTOMY WITH COFLEX;  Surgeon: Tia Alert, MD;  [facility];  Service: Neurosurgery;  Laterality: N/A;   TONSILLECTOMY     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 HEAD AND LINER EXCHANGE. POSSIBLE CUP REVISION;  Surgeon: Joen Laura, MD;  [facility]: [facility];  Service: Orthopedics;  Laterality: Right;    Allergies  Allergies  Allergen Reactions   Bactrim [Sulfamethoxazole-Trimethoprim] Hives and Itching    "threw me for a loop, hives, itching, extreme fatigue"    Home Medications    Prior to Admission medications   Medication Sig Start Date End Date Taking? Authorizing Provider  acetaminophen (TYLENOL) 325 MG tablet Take 650 mg by mouth every 6 (six) hours as needed for moderate pain.    [provider]  cholecalciferol (VITAMIN D3) 25 MCG (1000 UT) tablet Take 1,000 Units by mouth daily.    [provider]  clobetasol ointment (TEMOVATE) 0.05 % Apply 1 Application topically 2 (two) times daily as needed (itching). 05/14/21   [provider]  clonazePAM (KLONOPIN) 0.5 MG tablet TAKE ONE TABLET BY MOUTH AT BEDTIME 07/05/23   Ihor Austin, NP  dronedarone (MULTAQ) 400 MG tablet Take 1 tablet (400 mg total) by mouth 2 (two) times daily with a meal. 07/01/23 09/29/23  Tolia, Sunit, DO  ELIQUIS 5 MG TABS tablet TAKE ONE TABLET BY MOUTH TWICE DAILY 04/08/23   Tolia, Sunit, DO  hydrochlorothiazide (HYDRODIURIL) 25 MG tablet Take 1 tablet (25 mg total) by mouth  every morning. 06/03/23   Tolia, Sunit, DO  metFORMIN (GLUCOPHAGE) 500 MG tablet Take 500 mg by mouth 2 (two) times daily with a meal.    [provider]  metoprolol succinate (TOPROL-XL) 25 MG 24 hr tablet Take 1 tablet (25 mg total) by mouth every morning. 06/03/23   Tolia, Sunit, DO  Multiple Vitamins-Minerals (MULTIVITAMIN WITH MINERALS) tablet Take 1 tablet by mouth daily.    [provider]  Multiple Vitamins-Minerals (PRESERVISION AREDS 2 PO) Take 1 capsule by mouth daily.    [provider]  olmesartan (BENICAR) 40 MG tablet Take 1 tablet (40 mg total) by mouth daily at 10 pm. 06/03/23   Tolia, Sunit, DO  pantoprazole (PROTONIX) 40 MG tablet Take 1 tablet (40 mg total) by mouth 2 (two) times daily. 08/03/23 08/02/24  Kathi Der, MD  pyridOXINE (VITAMIN B-6) 100 MG tablet Take 100 mg by mouth daily.    [provider]  rosuvastatin (CRESTOR) 20 MG tablet Take 1 tablet (20 mg total) by mouth daily. 02/01/23   Tolia, Sunit, DO  spironolactone (ALDACTONE) 25 MG tablet Take 1 tablet (25 mg total) by mouth daily. 05/26/23   Odis Hollingshead, Sunit, DO  SYNTHROID  88 MCG tablet Take 88 mcg by mouth daily before breakfast. 06/11/22   [provider]  valACYclovir (VALTREX) 1000 MG tablet Take 1,000 mg by mouth daily as needed (Herpes flare).    [provider]    Physical Exam    Vital Signs:  Norman Lick. does not have vital signs available for review today.  Given telephonic nature of communication, physical exam is limited. AAOx3. NAD. Normal affect.  Speech and respirations are unlabored.  Accessory Clinical Findings    None  Assessment & Plan    1.  Preoperative Cardiovascular Risk Assessment:  According to the Revised Cardiac Risk Index (RCRI), his Perioperative Risk of Major Cardiac Event is (%): 0.9. His Functional Capacity in METs is: 8.97 according to the Duke Activity Status Index (DASI). Therefore, based on ACC/AHA  guidelines, patient would be at acceptable risk for the planned procedure without further cardiovascular testing.  The patient was advised that if he develops new symptoms prior to surgery to contact our office to arrange for a follow-up visit, and he verbalized understanding.  Per office protocol, patient can hold Eliquis for 2 days prior to procedure. Please resume Eliquis as soon as possible postprocedure, at the discretion of the surgeon.   A copy of this note will be routed to requesting surgeon.  Time:   Today, I have spent 6 minutes with the patient with telehealth technology discussing medical history, symptoms, and management plan.     Joylene Grapes, NP  08/26/2023, 9:19 AM

## 2023-08-30 DIAGNOSIS — K2930 Chronic superficial gastritis without bleeding: Secondary | ICD-10-CM | POA: Diagnosis not present

## 2023-08-30 DIAGNOSIS — K2980 Duodenitis without bleeding: Secondary | ICD-10-CM | POA: Diagnosis not present

## 2023-08-30 DIAGNOSIS — K222 Esophageal obstruction: Secondary | ICD-10-CM | POA: Diagnosis not present

## 2023-08-30 DIAGNOSIS — K2289 Other specified disease of esophagus: Secondary | ICD-10-CM | POA: Diagnosis not present

## 2023-08-30 DIAGNOSIS — K449 Diaphragmatic hernia without obstruction or gangrene: Secondary | ICD-10-CM | POA: Diagnosis not present

## 2023-08-30 DIAGNOSIS — G4733 Obstructive sleep apnea (adult) (pediatric): Secondary | ICD-10-CM | POA: Diagnosis not present

## 2023-08-30 DIAGNOSIS — R1310 Dysphagia, unspecified: Secondary | ICD-10-CM | POA: Diagnosis not present

## 2023-09-08 DIAGNOSIS — K2980 Duodenitis without bleeding: Secondary | ICD-10-CM | POA: Diagnosis not present

## 2023-09-08 DIAGNOSIS — K2289 Other specified disease of esophagus: Secondary | ICD-10-CM | POA: Diagnosis not present

## 2023-09-08 DIAGNOSIS — K2930 Chronic superficial gastritis without bleeding: Secondary | ICD-10-CM | POA: Diagnosis not present

## 2023-10-01 ENCOUNTER — Other Ambulatory Visit: Payer: Self-pay | Admitting: Cardiology

## 2023-10-01 DIAGNOSIS — Z79899 Other long term (current) drug therapy: Secondary | ICD-10-CM

## 2023-10-01 DIAGNOSIS — I480 Paroxysmal atrial fibrillation: Secondary | ICD-10-CM

## 2023-10-01 DIAGNOSIS — R58 Hemorrhage, not elsewhere classified: Secondary | ICD-10-CM | POA: Diagnosis not present

## 2023-10-03 ENCOUNTER — Ambulatory Visit: Payer: Self-pay | Admitting: Cardiology

## 2023-10-03 MED ORDER — MULTAQ 400 MG PO TABS: 400.0000 mg | ORAL_TABLET | Freq: Two times a day (BID) | ORAL | 0 refills | Status: DC

## 2023-10-12 ENCOUNTER — Telehealth: Payer: Self-pay | Admitting: Neurology

## 2023-10-12 NOTE — Telephone Encounter (Signed)
 Called pt back to discuss. While he did accept appt for 3/7 @ 10:30 am, he said the person he spoke to from [employer] told him that we should just be able to fax over a new order. I informed him that insurance would likely need recent OV notes, he states that when he saw Dr. Vickey Huger last year he was under the impression that he would be due for a new machine this year anyway. He requested for me to send a note to clinical staff to see if someone can call him back to give him some clarity on this. He's happy to come in if needed, but would prefer if something could be sent. I did inform him that note stating he would need an appointment came from the clinical staff. He also wanted me to note that he would like to stay with [employer] for his supplies.

## 2023-10-12 NOTE — Telephone Encounter (Signed)
 Pt states his CPAP is acting up, he has had it for over 5 years.  Pt would like Dr Vickey Huger to send a Rx for a new CPAP.

## 2023-10-13 ENCOUNTER — Encounter: Payer: Self-pay | Admitting: Neurology

## 2023-10-13 NOTE — Progress Notes (Signed)
 [facility] [address] [place]. [place] [address] [phone]       OFFICE FOLLOW UP NOTE  [name]. Date of Birth:  [birth year] Medical Record Number:  [id number]    Primary neurologist: Dr. Vickey Huger Reason for visit: CPAP and hx of TIA follow-up    SUBJECTIVE:   CHIEF COMPLAINT:  Chief Complaint  Patient presents with   Obstructive Sleep Apnea    Rm 8 alone Pt is well, reports his machine is currently not working and needs a new one.  No other concerns.     Follow-up visit:  Prior visit: 12/27/2022 Dr. Vickey Huger  Brief HPI:   Norman Hudson. Is a 73 y.o. male with longstanding history of OSA on CPAP, REM BD and history of TIA in 2020.  Last sleep study 05/2019 and CPAP set up 06/2019.   At prior visit with Dr. Vickey Huger, noted excellent CPAP compliance with residual AHI 5.1/h.  ESS 0/24.  Continue Klonopin for REM BD.      Interval history:  Returns today for follow-up visit due to concerns that his machine has been malfunctioning. Reports machine won't turn off unless he unplugs it and hasn't been registering his usage correctly.  He was told by his DME that he is eligible for new machine but needed a follow-up visit in order to pursue this.  Discussed past 30-day compliance which does appear to be registering correctly on our end with excellent usage although elevated residual AHI at 8.5.  He tries to avoid laying supine.  He does not believe he is going into A-fib and has remained NSR since last cardioversion in 08/2022, currently on Multaq for rhythm control, routinely followed by cardiology.  Continues to see benefit with CPAP use in regards to sleep quality and daytime energy levels.  ESS 2/24.  Routinely followed by DME [employer] and up to date on supplies.  Remains on Klonopin with great benefit for REM BD.  Does carry history of TIA, denies any new stroke/TIA symptoms.  Remains on Eliquis for A-fib and stroke  prevention as well as Crestor.  Routinely follows with PCP for stroke risk factor management.       ROS:   14 system review of systems performed and negative with exception of those listed in HPI  PMH:  Past Medical History:  Diagnosis Date   Acne    Anxiety    takes Xanax daily as needed   Arthritis    Atrial flutter, paroxysmal (HCC)    History of kidney stones    Hyperlipidemia    takes Pravastatin daily   Hypertension    takes Amlodipine and Valsartan-HCTZ daily   Hypothyroidism    takes Synthroid daily   Inguinal hernia    left side   Internal hemorrhoids    Joint pain    Kidney stones    Pre-diabetes    Rosacea    TIA (transient ischemic attack) 06/2019    PSH:  Past Surgical History:  Procedure Laterality Date   BIOPSY  08/03/2023   Procedure: BIOPSY;  Surgeon: Kathi Der, MD;  [facility]: [facility];  Service: Gastroenterology;;   CARDIOVERSION N/A 08/28/2022   Procedure: CARDIOVERSION;  Surgeon: Yates Decamp, MD;  [facility]: [facility];  Service: Cardiovascular;  Laterality: N/A;   CARDIOVERSION N/A 08/29/2022   Procedure: CARDIOVERSION;  Surgeon: Yates Decamp, MD;  Location: [facility];  Service: Cardiovascular;  Laterality: N/A;   COLONOSCOPY     ESOPHAGOGASTRODUODENOSCOPY N/A 05/29/2023  Procedure: ESOPHAGOGASTRODUODENOSCOPY (EGD);  Surgeon: Kerin Salen, MD;  Location: [facility] ENDOSCOPY;  Service: Gastroenterology;  Laterality: N/A;   ESOPHAGOGASTRODUODENOSCOPY (EGD) WITH PROPOFOL N/A 08/03/2023   Procedure: ESOPHAGOGASTRODUODENOSCOPY (EGD) WITH PROPOFOL;  Surgeon: Kathi Der, MD;  [facility];  Service: Gastroenterology;  Laterality: N/A;   EXTRACORPOREAL SHOCK WAVE LITHOTRIPSY Right 12/27/2019   Procedure: EXTRACORPOREAL SHOCK WAVE LITHOTRIPSY (ESWL);  Surgeon: Malen Gauze, MD;  Location: [facility];  Service: Urology;  Laterality: Right;   FOREIGN BODY REMOVAL ESOPHAGEAL  08/03/2023   Procedure: REMOVAL FOREIGN BODY  ESOPHAGEAL;  Surgeon: Kathi Der, MD;  [facility];  Service: Gastroenterology;;   IMPACTION REMOVAL  05/29/2023   Procedure: IMPACTION REMOVAL;  Surgeon: Kerin Salen, MD;  Location: [facility] ENDOSCOPY;  Service: Gastroenterology;;  Food removed with roth net   IR US GUIDE BX ASP/DRAIN  09/06/2022   LITHOTRIPSY  09/2015   LUMBAR LAMINECTOMY WITH COFLEX 1 LEVEL N/A 07/07/2015   Procedure: LUMBAR FOUR-FIVE LUMBAR LAMINECTOMY WITH COFLEX;  Surgeon: Tia Alert, MD;  [facility];  Service: Neurosurgery;  Laterality: N/A;   TONSILLECTOMY     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 HEAD AND LINER EXCHANGE. POSSIBLE CUP REVISION;  Surgeon: Joen Laura, MD;  Location: [facility];  Service: Orthopedics;  Laterality: Right;    Social History:  Social History   Socioeconomic History   Marital status: Married    Spouse name: Not on file   Number of children: 2   Years of education: Not on file   Highest education level: Bachelor's degree (e.g., BA, AB, BS)  Occupational History   Not on file  Tobacco Use   Smoking status: Former    Current packs/day: 0.00    Average packs/day: 1 pack/day for 15.0 years (15.0 ttl pk-yrs)    Types: Cigarettes    Start date: 02/12/1974    Quit date: 02/12/1989    Years since quitting: 34.6   Smokeless tobacco: Never   Tobacco comments:    quit smoking in 1991  Vaping Use   Vaping status: Never Used  Substance and Sexual Activity   Alcohol use: No   Drug use: No   Sexual activity: Not on file  Other Topics Concern   Not on file  Social History Narrative   Lives at  home with wife   4 cups of caffeine/day   Social Drivers of Corporate investment banker Strain: Not on file  Food Insecurity: No Food Insecurity (09/03/2022)   Hunger Vital Sign    Worried About Running Out of Food in the Last Year: Never true    Ran Out of Food in the Last Year: Never true  Transportation Needs: No Transportation Needs (09/03/2022)   PRAPARE - Administrator, Civil Service (Medical): No    Lack of Transportation (Non-Medical): No  Physical Activity: Not on file  Stress: Not on file  Social Connections: Not on file  Intimate Partner Violence: Not At Risk (09/03/2022)   Humiliation, Afraid, Rape, and Kick questionnaire    Fear of Current or Ex-Partner: No    Emotionally Abused: No  Physically Abused: No    Sexually Abused: No    Family History:  Family History  Problem Relation Age of Onset   Hypertension Father    Sleep disorder Father    CVA Father    Cancer Maternal Grandfather    Hypertension Paternal Grandmother    Diabetes Paternal Grandmother    Alcoholism Mother    Hypertension Sister    Aneurysm Sister    Atrial fibrillation Sister    Other Neg Hx        hypogonadism   Sleep apnea Neg Hx     Medications:   Current Outpatient Medications on File Prior to Visit  Medication Sig Dispense Refill   acetaminophen (TYLENOL) 325 MG tablet Take 650 mg by mouth every 6 (six) hours as needed for moderate pain.     cholecalciferol (VITAMIN D3) 25 MCG (1000 UT) tablet Take 1,000 Units by mouth daily.     clobetasol ointment (TEMOVATE) 0.05 % Apply 1 Application topically 2 (two) times daily as needed (itching).     clonazePAM (KLONOPIN) 0.5 MG tablet TAKE ONE TABLET BY MOUTH AT BEDTIME 30 tablet 5   dronedarone (MULTAQ) 400 MG tablet Take 1 tablet (400 mg total) by mouth 2 (two) times daily with a meal. 180 tablet 0   ELIQUIS 5 MG TABS tablet TAKE ONE TABLET BY MOUTH TWICE DAILY 180 tablet 1   hydrochlorothiazide (HYDRODIURIL) 25 MG tablet Take  1 tablet (25 mg total) by mouth every morning. 90 tablet 2   metFORMIN (GLUCOPHAGE) 500 MG tablet Take 500 mg by mouth 2 (two) times daily with a meal.     metoprolol succinate (TOPROL-XL) 25 MG 24 hr tablet Take 1 tablet (25 mg total) by mouth every morning. 90 tablet 2   Multiple Vitamins-Minerals (MULTIVITAMIN WITH MINERALS) tablet Take 1 tablet by mouth daily.     Multiple Vitamins-Minerals (PRESERVISION AREDS 2 PO) Take 1 capsule by mouth daily.     olmesartan (BENICAR) 40 MG tablet Take 1 tablet (40 mg total) by mouth daily at 10 pm. 90 tablet 2   pantoprazole (PROTONIX) 40 MG tablet Take 1 tablet (40 mg total) by mouth 2 (two) times daily. 180 tablet 1   pyridOXINE (VITAMIN B-6) 100 MG tablet Take 100 mg by mouth daily.     rosuvastatin (CRESTOR) 20 MG tablet Take 1 tablet (20 mg total) by mouth daily. 90 tablet 2   spironolactone (ALDACTONE) 25 MG tablet Take 1 tablet (25 mg total) by mouth daily. 90 tablet 3   SYNTHROID 88 MCG tablet Take 88 mcg by mouth daily before breakfast.     valACYclovir (VALTREX) 1000 MG tablet Take 1,000 mg by mouth daily as needed (Herpes flare).     No current facility-administered medications on file prior to visit.    Allergies:   Allergies  Allergen Reactions   Bactrim [Sulfamethoxazole-Trimethoprim] Hives and Itching    "threw me for a loop, hives, itching, extreme fatigue"      OBJECTIVE:  Physical Exam  Vitals:   10/14/23 1026  BP: 139/79  Pulse: 78  Weight: 200 lb (90.7 kg)  Height: 5\' 7"  (1.702 m)   Body mass index is 31.32 kg/m. No results found.   General: well developed, well nourished, very pleasant elderly Caucasian male, seated, in no evident distress Head: head normocephalic and atraumatic.   Neck: supple with no carotid or supraclavicular bruits Cardiovascular: regular rate and rhythm, no murmurs Musculoskeletal: no deformity Skin:  no rash/petichiae Vascular:  Normal  pulses all extremities   Neurologic Exam Mental  Status: Awake and fully alert. Oriented to place and time. Recent and remote memory intact. Attention span, concentration and fund of knowledge appropriate. Mood and affect appropriate.  Cranial Nerves: Pupils equal, briskly reactive to light. Extraocular movements full without nystagmus. Visual fields full to confrontation. Hearing intact. Facial sensation intact. Face, tongue, palate moves normally and symmetrically.  Motor: Normal bulk and tone. Normal strength in all tested extremity muscles Gait and Station: Arises from chair without difficulty. Stance is normal. Gait demonstrates normal stride length and balance without use of AD. Tandem walk and heel toe without difficulty.         ASSESSMENT/PLAN: Norman Hudson. is a 73 y.o. year old male with longstanding history of OSA on CPAP, REM BD and hx of TIA in 2020. Also carries a history of postop A-fib in 08/2022 s/p cardioversion    OSA on CPAP :  Compliance report shows satisfactory usage with residual AHI 8.5/hr Adjust pressure setting from 7-18 to 10-20, adjustment made via ResMed Patient concerned machine not recording usage accurately, will recheck download in 1 week to ensure we are able to see data and will repeat again in 2 weeks time to reevaluate AHI. If AHI improved, will reach out to DME to request new machine as current machine malfunctioning, if AHI slightly improved but still not at goal, will reevaluate download an additional 2 weeks, if no improvement of AHI, order will be placed to proceed with CPAP titration with possible need of BiPAP Patient is having issues with current machine malfunctioning and not turning off unless unplugged.  Technically, he is not due for new machine until 06/2024 but if current machine not working properly, he may be eligible to receive new machine at this time.  Discussed pursuing new machine at this time or waiting as he may need to transition to BiPAP, he was agreeable to continue  with his current machine currently as it will only be for a short duration.  Discussed continued nightly usage with ensuring greater than 4 hours nightly for optimal benefit and per insurance purposes.   Continue to follow with DME company for any needed supplies or CPAP related concerns   REM BD: Currently well-controlled.  Continue Klonopin as needed  Hx of TIA:  no new or recurrent symptoms.   Currently on Eliquis for history of A-fib with CHA2DS2-VASc score of 4.  Routinely followed by cardiology. Continue Crestor 20 mg daily managed/prescribed by PCP/cardiology Continue close PCP/cardiology follow-up for aggressive stroke risk factor management     Follow-up will be determined depending on the above, if he receives new machine, will need 31-90 day follow up per insurance requirements   CC:  PCP: Shon Hale, MD    I spent 43 minutes of face-to-face and non-face-to-face time with patient.  This included previsit chart review, lab review, study review, order entry, electronic health record documentation, patient education and discussion regarding above diagnoses and treatment plan and answered all other questions to patient's satisfaction  Ihor Austin, [facility]  [facility] [address] [place], [place] [address]  Phone [phone] Fax [phone] Note: This document was prepared with digital dictation and possible smart phrase technology. Any transcriptional errors that result from this process are unintentional.

## 2023-10-14 ENCOUNTER — Ambulatory Visit: Admitting: Adult Health

## 2023-10-14 ENCOUNTER — Encounter: Payer: Self-pay | Admitting: Adult Health

## 2023-10-14 ENCOUNTER — Other Ambulatory Visit: Payer: Self-pay | Admitting: Cardiology

## 2023-10-14 VITALS — BP 139/79 | HR 78 | Ht 67.0 in | Wt 200.0 lb

## 2023-10-14 DIAGNOSIS — G4733 Obstructive sleep apnea (adult) (pediatric): Secondary | ICD-10-CM

## 2023-10-14 DIAGNOSIS — Z7901 Long term (current) use of anticoagulants: Secondary | ICD-10-CM

## 2023-10-14 DIAGNOSIS — G4752 REM sleep behavior disorder: Secondary | ICD-10-CM

## 2023-10-14 DIAGNOSIS — I4892 Unspecified atrial flutter: Secondary | ICD-10-CM

## 2023-10-14 DIAGNOSIS — Z8673 Personal history of transient ischemic attack (TIA), and cerebral infarction without residual deficits: Secondary | ICD-10-CM

## 2023-10-14 NOTE — Telephone Encounter (Signed)
 Prescription refill request for Eliquis received. Indication:afib Last office visit:1/25 Scr:1.02  12/24 Age: 73 Weight:90.7  kg  Prescription refilled

## 2023-10-14 NOTE — Patient Instructions (Signed)
 Continue use with current machine at this time  Pressure adjustments made, will review a download in 1 week to ensure his recording correctly and I will keep you updated.  I will repeat a download 1 week after that to see if your residual amount of apneas have improved, if they have we will proceed with trying to get a new AutoPap machine but if not, may need to consider pursuing titration study with possible need of BiPAP  Continue Klonopin nightly for REM behavior disorder  Continue close PCP and cardiology follow-up for aggressive stroke risk factor management

## 2023-10-17 ENCOUNTER — Encounter: Payer: Self-pay | Admitting: Cardiology

## 2023-10-17 ENCOUNTER — Ambulatory Visit: Payer: Medicare HMO | Attending: Cardiology | Admitting: Cardiology

## 2023-10-17 VITALS — BP 126/74 | HR 71 | Resp 16 | Ht 67.0 in | Wt 202.4 lb

## 2023-10-17 DIAGNOSIS — E7800 Pure hypercholesterolemia, unspecified: Secondary | ICD-10-CM | POA: Diagnosis not present

## 2023-10-17 DIAGNOSIS — I4819 Other persistent atrial fibrillation: Secondary | ICD-10-CM | POA: Diagnosis not present

## 2023-10-17 DIAGNOSIS — I10 Essential (primary) hypertension: Secondary | ICD-10-CM

## 2023-10-17 DIAGNOSIS — G459 Transient cerebral ischemic attack, unspecified: Secondary | ICD-10-CM

## 2023-10-17 DIAGNOSIS — G4733 Obstructive sleep apnea (adult) (pediatric): Secondary | ICD-10-CM

## 2023-10-17 DIAGNOSIS — Z87891 Personal history of nicotine dependence: Secondary | ICD-10-CM | POA: Diagnosis not present

## 2023-10-17 DIAGNOSIS — Z7901 Long term (current) use of anticoagulants: Secondary | ICD-10-CM | POA: Diagnosis not present

## 2023-10-17 DIAGNOSIS — Z79899 Other long term (current) drug therapy: Secondary | ICD-10-CM

## 2023-10-17 NOTE — Progress Notes (Signed)
 Cardiology Office Note:  .   Date:  10/17/2023  ID:  Norman Hudson., DOB 1950/12/18, MRN [id number] PCP:  Shon Hale, MD  Former Cardiology Providers: Altamese Hideaway, APRN, FNP-C   HeartCare Providers Cardiologist:  Tessa Lerner, DO , [facility] (established care 02/21/2020) Electrophysiologist:  None  Click to update primary MD,subspecialty MD or APP then REFRESH:1}    Chief Complaint  Patient presents with   Elevated coronary artery calcium score   Follow-up    6 months Persistent atrial fibrillation.    History of Present Illness: .   Norman Hudson. is a 73 y.o.  male whose past medical history and cardiovascular risk factors includes: Hx of esophageal dilatation, Persistent fibrillation status post cardioversion, prediabetes, hypertension, hyperlipidemia, chronically low testosterone, hypothyroidism, former smoker with 15 pack year history, anxiety, osteoarthritis, history of TIA, advanced age.   Patient is being followed by the practice for management of persistent atrial fibrillation.  During his hip revision surgery in January 2024 he was found to be in A-fib with RVR.  And on 08/28/2022 he was cardioverted 3 times but was unsuccessful.  He underwent repeat cardioversions following day on 08/29/2022 and converted to sinus rhythm.   At the last office visit patient was transition from flecainide to [facility] which she is tolerating well without any side effects or intolerances.   Since last office visit he has had episodes of meat impaction in the esophagus requiring emergent intervention.  He has followed up with gastroenterology and underwent esophageal dilatation based on what he describes to me.  Otherwise he is doing well from a cardiovascular standpoint.  Denies anginal chest pain or heart failure symptoms.  No palpitations, near-syncope or syncopal events.  Review of Systems: .   Review of Systems  Cardiovascular:  Negative for chest pain,  claudication, irregular heartbeat, leg swelling, near-syncope, orthopnea, palpitations, paroxysmal nocturnal dyspnea and syncope.  Respiratory:  Negative for shortness of breath.   Hematologic/Lymphatic: Negative for bleeding problem.    Studies Reviewed:   Direct-current cardioversion 08/28/2022:  150 J x 2 followed by 200 J x 1 without success.   08/29/2022: 150 J x 1 converted to sinus rhythm   EKG: EKG Interpretation Date/Time:  Monday October 17 2023 16:37:56 EDT Ventricular Rate:  74 PR Interval:  214 QRS Duration:  96 QT Interval:  398 QTC Calculation: 441 R Axis:   37  Text Interpretation: Sinus rhythm with 1st degree A-V block Incomplete right bundle branch block When compared with ECG of 28-May-2023 23:38, First degree A-V block has decreased in duration No significant change since last tracing Confirmed by Tessa Lerner [phone]) on 10/17/2023 4:45:57 PM  Echocardiogram: January 2024: LVEF 60 to 65%, normal diastolic function, no significant valvular heart disease  Stress Testing: Exercise myoview stress test  04/23/2019: Low risk study.  RADIOLOGY: NA  Risk Assessment/Calculations:   Click Here to Calculate/Change CHADS2VASc Score The patient's CHADS2-VASc score is 4, indicating a 4.8% annual risk of stroke.     Labs:       Latest Ref Rng & Units 08/03/2023    7:47 PM 05/28/2023   11:44 PM 04/04/2023    2:53 PM  CBC  WBC 4.0 - 10.5 K/uL 6.1  7.6    Hemoglobin 13.0 - 17.0 g/dL 19.1  47.8  29.5   Hematocrit 39.0 - 52.0 % 37.4  36.2    Platelets 150 - 400 K/uL 144  141  Latest Ref Rng & Units 08/03/2023    7:47 PM 05/28/2023   11:44 PM 09/07/2022    4:58 AM  BMP  Glucose 70 - 99 mg/dL 409  811  914   BUN 8 - 23 mg/dL 27  27  14    Creatinine 0.61 - 1.24 mg/dL 7.82  9.56  2.13   Sodium 135 - 145 mmol/L 139  141  134   Potassium 3.5 - 5.1 mmol/L 3.9  4.1  4.2   Chloride 98 - 111 mmol/L 109  105  103   CO2 22 - 32 mmol/L 21  25  25    Calcium 8.9 -  10.3 mg/dL 9.4  9.5  8.7       Latest Ref Rng & Units 08/03/2023    7:47 PM 05/28/2023   11:44 PM 09/07/2022    4:58 AM  CMP  Glucose 70 - 99 mg/dL 086  578  469   BUN 8 - 23 mg/dL 27  27  14    Creatinine 0.61 - 1.24 mg/dL 6.29  5.28  4.13   Sodium 135 - 145 mmol/L 139  141  134   Potassium 3.5 - 5.1 mmol/L 3.9  4.1  4.2   Chloride 98 - 111 mmol/L 109  105  103   CO2 22 - 32 mmol/L 21  25  25    Calcium 8.9 - 10.3 mg/dL 9.4  9.5  8.7     Lab Results  Component Value Date   CHOL 139 07/12/2019   HDL 46 07/12/2019   LDLCALC 72 07/12/2019   TRIG 115 07/12/2019   CHOLHDL 3.0 07/12/2019   No results for input(s): "LIPOA" in the last 8760 hours. No components found for: "NTPROBNP" No results for input(s): "PROBNP" in the last 8760 hours. No results for input(s): "TSH" in the last 8760 hours.   Physical Exam:    Today's Vitals   10/17/23 1632  BP: 126/74  Pulse: 71  Resp: 16  SpO2: 98%  Weight: 202 lb 6.4 oz (91.8 kg)  Height: 5\' 7"  (1.702 m)   Body mass index is 31.7 kg/m. Wt Readings from Last 3 Encounters:  10/17/23 202 lb 6.4 oz (91.8 kg)  10/14/23 200 lb (90.7 kg)  08/03/23 203 lb (92.1 kg)    Physical Exam  Constitutional: No distress.  hemodynamically stable  Neck: No JVD present.  Cardiovascular: Normal rate, regular rhythm, S1 normal and S2 normal. Exam reveals no gallop, no S3 and no S4.  No murmur heard. Pulmonary/Chest: Effort normal and breath sounds normal. No stridor. He has no wheezes. He has no rales.  Musculoskeletal:        General: No edema.     Cervical back: Neck supple.  Skin: Skin is warm.     Impression & Recommendation(s):  Impression:   ICD-10-CM   1. Persistent atrial fibrillation (HCC)  I48.19 EKG 12-Lead    Basic metabolic panel    Hemoglobin and hematocrit, blood    Hemoglobin and hematocrit, blood    Basic metabolic panel    2. Long term (current) use of anticoagulants  Z79.01 Basic metabolic panel    Hemoglobin and  hematocrit, blood    Hemoglobin and hematocrit, blood    Basic metabolic panel    3. Long term current use of antiarrhythmic drug  Z79.899     4. Essential hypertension  I10 Basic metabolic panel    Basic metabolic panel    5. TIA (transient ischemic attack)  G45.9  6. Pure hypercholesterolemia  E78.00     7. OSA on CPAP  G47.33     8. Former smoker  Z87.891        Recommendation(s):  Persistent atrial fibrillation (HCC) Long term (current) use of anticoagulants Long term current use of antiarrhythmic drug Rate control: Metoprolol. Rhythm control: Multaq. Thromboembolic prophylaxis: Eliquis. Status post cardioversion in January 2024 as mentioned above. Prior antiarrhythmics: Flecainide-discontinued as physical limitations with preventing to have follow-ups GXT after initiation of flecainide. EKG today illustrates sinus rhythm Does not endorse evidence of bleeding. Most recent hemoglobin checked 11.4 g/dL September 19, 2023, [facility] database TSH 7.1, patient states that he will have a recheck in the coming weeks with PCP. Will check H&H and BMP prior to next office visit.  Essential hypertension Office blood pressures are very well-controlled. Continue hydrochlorothiazide 25 mg p.o. every morning. Continue metoprolol succinate 25 mg p.o. every morning. Continue Benicar 40 mg p.o. every afternoon. Continue spironolactone 25 mg p.o. daily Reemphasized importance of low-salt diet.  TIA (transient ischemic attack) Reemphasized importance of secondary prevention. Continue statin therapy. Currently not on aspirin is on anticoagulation.  Pure hypercholesterolemia Currently on Crestor 20 mg p.o. daily.   He denies myalgia or other side effects. Most recent lipids dated 09/19/2023, independently reviewed as noted above.  LDL is 32 mg/dL.   OSA on CPAP Endorses compliance with CPAP, follows with Dr. Vickey Huger  Orders Placed:  Orders Placed This Encounter  Procedures   Basic  metabolic panel    Standing Status:   Future    Number of Occurrences:   1    Expected Date:   04/18/2024    Expiration Date:   10/16/2024   Hemoglobin and hematocrit, blood    Standing Status:   Future    Number of Occurrences:   1    Expected Date:   04/18/2024    Expiration Date:   10/16/2024   EKG 12-Lead    Final Medication List:   No orders of the defined types were placed in this encounter.   There are no discontinued medications.   Current Outpatient Medications:    acetaminophen (TYLENOL) 325 MG tablet, Take 650 mg by mouth every 6 (six) hours as needed for moderate pain., Disp: , Rfl:    cholecalciferol (VITAMIN D3) 25 MCG (1000 UT) tablet, Take 1,000 Units by mouth daily., Disp: , Rfl:    clobetasol ointment (TEMOVATE) 0.05 %, Apply 1 Application topically 2 (two) times daily as needed (itching)., Disp: , Rfl:    clonazePAM (KLONOPIN) 0.5 MG tablet, TAKE ONE TABLET BY MOUTH AT BEDTIME, Disp: 30 tablet, Rfl: 5   dronedarone (MULTAQ) 400 MG tablet, Take 1 tablet (400 mg total) by mouth 2 (two) times daily with a meal., Disp: 180 tablet, Rfl: 0   ELIQUIS 5 MG TABS tablet, TAKE ONE TABLET BY MOUTH TWICE DAILY, Disp: 180 tablet, Rfl: 1   hydrochlorothiazide (HYDRODIURIL) 25 MG tablet, Take 1 tablet (25 mg total) by mouth every morning., Disp: 90 tablet, Rfl: 2   metFORMIN (GLUCOPHAGE) 500 MG tablet, Take 500 mg by mouth 2 (two) times daily with a meal., Disp: , Rfl:    metoprolol succinate (TOPROL-XL) 25 MG 24 hr tablet, Take 1 tablet (25 mg total) by mouth every morning., Disp: 90 tablet, Rfl: 2   Multiple Vitamins-Minerals (MULTIVITAMIN WITH MINERALS) tablet, Take 1 tablet by mouth daily., Disp: , Rfl:    Multiple Vitamins-Minerals (PRESERVISION AREDS 2 PO), Take 1 capsule by mouth daily., Disp: ,  Rfl:    olmesartan (BENICAR) 40 MG tablet, Take 1 tablet (40 mg total) by mouth daily at 10 pm., Disp: 90 tablet, Rfl: 2   pantoprazole (PROTONIX) 40 MG tablet, Take 1 tablet (40 mg  total) by mouth 2 (two) times daily., Disp: 180 tablet, Rfl: 1   pyridOXINE (VITAMIN B-6) 100 MG tablet, Take 100 mg by mouth daily., Disp: , Rfl:    rosuvastatin (CRESTOR) 20 MG tablet, Take 1 tablet (20 mg total) by mouth daily., Disp: 90 tablet, Rfl: 2   spironolactone (ALDACTONE) 25 MG tablet, Take 1 tablet (25 mg total) by mouth daily., Disp: 90 tablet, Rfl: 3   SYNTHROID 88 MCG tablet, Take 88 mcg by mouth daily before breakfast., Disp: , Rfl:    valACYclovir (VALTREX) 1000 MG tablet, Take 1,000 mg by mouth daily as needed (Herpes flare)., Disp: , Rfl:   Consent:   NA  Disposition:   6 months sooner if needed  His questions and concerns were addressed to his satisfaction. He voices understanding of the recommendations provided during this encounter.    Signed, Tessa Lerner, DO, [facility]  [facility]  [address] [place], [place] [address] 10/17/2023 5:43 PM

## 2023-10-17 NOTE — Patient Instructions (Addendum)
 Medication Instructions:  Your physician recommends that you continue on your current medications as directed. Please refer to the Current Medication list given to you today.  *If you need a refill on your cardiac medications before your next appointment, please call your pharmacy*  Lab Work: To be completed prior to 6 month follow-up: BMP and hemoglobin/hematocrit   If you have labs (blood work) drawn today and your tests are completely normal, you will receive your results only by: MyChart Message (if you have MyChart) OR A paper copy in the mail If you have any lab test that is abnormal or we need to change your treatment, we will call you to review the results.  Testing/Procedures: None ordered today.  Follow-Up: At [facility], you and your health needs are our priority.  As part of our continuing mission to provide you with exceptional heart care, we have created designated Provider Care Teams.  These Care Teams include your primary Cardiologist (physician) and Advanced Practice Providers (APPs -  Physician Assistants and Nurse Practitioners) who all work together to provide you with the care you need, when you need it.  We recommend signing up for the patient portal called "MyChart".  Sign up information is provided on this After Visit Summary.  MyChart is used to connect with patients for Virtual Visits (Telemedicine).  Patients are able to view lab/test results, encounter notes, upcoming appointments, etc.  Non-urgent messages can be sent to your provider as well.   To learn more about what you can do with MyChart, go to [email or web address].    Your next appointment:   6 month(s)  The format for your next appointment:   In Person  Provider:   Tessa Lerner, DO {

## 2023-10-19 ENCOUNTER — Encounter: Payer: Self-pay | Admitting: Adult Health

## 2023-10-19 DIAGNOSIS — G4733 Obstructive sleep apnea (adult) (pediatric): Secondary | ICD-10-CM

## 2023-10-27 NOTE — Telephone Encounter (Signed)
 Will place order to DME to obtain new CPAP machine as current machine malfunctioning (unable to turn off unless he unplugs machine). Noted improvement of residual AHI is currently at 4.8 on new settings of 10-20 with EPR 1 (previously on setting of 7-18, residual AHI 8.5).  Recommend continuation with current settings.

## 2023-11-01 ENCOUNTER — Other Ambulatory Visit: Payer: Self-pay | Admitting: Cardiology

## 2023-11-02 ENCOUNTER — Other Ambulatory Visit: Payer: Self-pay

## 2023-11-02 ENCOUNTER — Encounter: Payer: Self-pay | Admitting: Cardiology

## 2023-11-02 MED ORDER — ROSUVASTATIN CALCIUM 20 MG PO TABS: 20.0000 mg | ORAL_TABLET | Freq: Every day | ORAL | 3 refills | Status: AC

## 2023-11-04 DIAGNOSIS — G4733 Obstructive sleep apnea (adult) (pediatric): Secondary | ICD-10-CM | POA: Diagnosis not present

## 2023-11-10 DIAGNOSIS — E039 Hypothyroidism, unspecified: Secondary | ICD-10-CM | POA: Diagnosis not present

## 2023-11-11 DIAGNOSIS — Z1212 Encounter for screening for malignant neoplasm of rectum: Secondary | ICD-10-CM | POA: Diagnosis not present

## 2023-11-11 DIAGNOSIS — Z1211 Encounter for screening for malignant neoplasm of colon: Secondary | ICD-10-CM | POA: Diagnosis not present

## 2023-11-18 DIAGNOSIS — H52223 Regular astigmatism, bilateral: Secondary | ICD-10-CM | POA: Diagnosis not present

## 2023-11-18 DIAGNOSIS — H524 Presbyopia: Secondary | ICD-10-CM | POA: Diagnosis not present

## 2023-12-05 DIAGNOSIS — G4733 Obstructive sleep apnea (adult) (pediatric): Secondary | ICD-10-CM | POA: Diagnosis not present

## 2023-12-22 ENCOUNTER — Other Ambulatory Visit: Payer: Self-pay | Admitting: Adult Health

## 2023-12-22 ENCOUNTER — Telehealth: Payer: Self-pay | Admitting: Adult Health

## 2023-12-22 NOTE — Telephone Encounter (Signed)
 Pt called to reschedule appt . Pt states Provider told him he doesn't have to see her until next year .  New Appt Scheduled

## 2023-12-27 NOTE — Telephone Encounter (Signed)
 Provider sent on 12/26/23. Pharmacy confirmed receipt 12/26/23 at 12:51 pm.

## 2023-12-28 ENCOUNTER — Ambulatory Visit: Payer: Medicare HMO | Admitting: Adult Health

## 2023-12-31 ENCOUNTER — Other Ambulatory Visit: Payer: Self-pay | Admitting: Cardiology

## 2023-12-31 DIAGNOSIS — Z79899 Other long term (current) drug therapy: Secondary | ICD-10-CM

## 2023-12-31 DIAGNOSIS — I480 Paroxysmal atrial fibrillation: Secondary | ICD-10-CM

## 2024-01-03 DIAGNOSIS — L820 Inflamed seborrheic keratosis: Secondary | ICD-10-CM | POA: Diagnosis not present

## 2024-01-03 DIAGNOSIS — L905 Scar conditions and fibrosis of skin: Secondary | ICD-10-CM | POA: Diagnosis not present

## 2024-01-03 DIAGNOSIS — D1801 Hemangioma of skin and subcutaneous tissue: Secondary | ICD-10-CM | POA: Diagnosis not present

## 2024-01-03 DIAGNOSIS — D2362 Other benign neoplasm of skin of left upper limb, including shoulder: Secondary | ICD-10-CM | POA: Diagnosis not present

## 2024-01-03 DIAGNOSIS — D234 Other benign neoplasm of skin of scalp and neck: Secondary | ICD-10-CM | POA: Diagnosis not present

## 2024-01-03 DIAGNOSIS — L821 Other seborrheic keratosis: Secondary | ICD-10-CM | POA: Diagnosis not present

## 2024-01-03 DIAGNOSIS — L919 Hypertrophic disorder of the skin, unspecified: Secondary | ICD-10-CM | POA: Diagnosis not present

## 2024-01-04 DIAGNOSIS — G4733 Obstructive sleep apnea (adult) (pediatric): Secondary | ICD-10-CM | POA: Diagnosis not present

## 2024-01-09 DIAGNOSIS — G4733 Obstructive sleep apnea (adult) (pediatric): Secondary | ICD-10-CM | POA: Diagnosis not present

## 2024-02-04 DIAGNOSIS — G4733 Obstructive sleep apnea (adult) (pediatric): Secondary | ICD-10-CM | POA: Diagnosis not present

## 2024-02-29 ENCOUNTER — Other Ambulatory Visit: Payer: Self-pay | Admitting: Cardiology

## 2024-02-29 DIAGNOSIS — I10 Essential (primary) hypertension: Secondary | ICD-10-CM

## 2024-02-29 DIAGNOSIS — I4892 Unspecified atrial flutter: Secondary | ICD-10-CM

## 2024-03-05 DIAGNOSIS — G4733 Obstructive sleep apnea (adult) (pediatric): Secondary | ICD-10-CM | POA: Diagnosis not present

## 2024-03-20 DIAGNOSIS — E7800 Pure hypercholesterolemia, unspecified: Secondary | ICD-10-CM | POA: Diagnosis not present

## 2024-03-20 DIAGNOSIS — R718 Other abnormality of red blood cells: Secondary | ICD-10-CM | POA: Diagnosis not present

## 2024-03-20 DIAGNOSIS — R7303 Prediabetes: Secondary | ICD-10-CM | POA: Diagnosis not present

## 2024-03-20 DIAGNOSIS — I482 Chronic atrial fibrillation, unspecified: Secondary | ICD-10-CM | POA: Diagnosis not present

## 2024-03-20 DIAGNOSIS — G8929 Other chronic pain: Secondary | ICD-10-CM | POA: Diagnosis not present

## 2024-03-20 DIAGNOSIS — I10 Essential (primary) hypertension: Secondary | ICD-10-CM | POA: Diagnosis not present

## 2024-03-20 DIAGNOSIS — E039 Hypothyroidism, unspecified: Secondary | ICD-10-CM | POA: Diagnosis not present

## 2024-03-21 LAB — LAB REPORT - SCANNED: EGFR: 64

## 2024-03-27 ENCOUNTER — Ambulatory Visit: Payer: Self-pay | Admitting: Cardiology

## 2024-04-05 DIAGNOSIS — G4733 Obstructive sleep apnea (adult) (pediatric): Secondary | ICD-10-CM | POA: Diagnosis not present

## 2024-04-08 DIAGNOSIS — I4891 Unspecified atrial fibrillation: Secondary | ICD-10-CM | POA: Diagnosis not present

## 2024-04-08 DIAGNOSIS — M1610 Unilateral primary osteoarthritis, unspecified hip: Secondary | ICD-10-CM | POA: Diagnosis not present

## 2024-04-08 DIAGNOSIS — E119 Type 2 diabetes mellitus without complications: Secondary | ICD-10-CM | POA: Diagnosis not present

## 2024-04-08 DIAGNOSIS — E7800 Pure hypercholesterolemia, unspecified: Secondary | ICD-10-CM | POA: Diagnosis not present

## 2024-04-10 ENCOUNTER — Other Ambulatory Visit: Payer: Self-pay | Admitting: Cardiology

## 2024-04-10 DIAGNOSIS — Z7901 Long term (current) use of anticoagulants: Secondary | ICD-10-CM

## 2024-04-10 DIAGNOSIS — I4892 Unspecified atrial flutter: Secondary | ICD-10-CM

## 2024-04-10 NOTE — Telephone Encounter (Signed)
 Prescription refill request for Eliquis  received. Indication:afib Last office visit:3/25 Scr:1.19  8/25 Age: 73 Weight:91.8  kg  Prescription refilled

## 2024-04-11 DIAGNOSIS — G4733 Obstructive sleep apnea (adult) (pediatric): Secondary | ICD-10-CM | POA: Diagnosis not present

## 2024-04-19 ENCOUNTER — Inpatient Hospital Stay

## 2024-04-19 ENCOUNTER — Inpatient Hospital Stay: Attending: Hematology and Oncology | Admitting: Hematology and Oncology

## 2024-04-19 VITALS — BP 130/72 | HR 73 | Temp 97.8°F | Resp 18 | Ht 68.0 in | Wt 204.0 lb

## 2024-04-19 DIAGNOSIS — Z79899 Other long term (current) drug therapy: Secondary | ICD-10-CM | POA: Diagnosis not present

## 2024-04-19 DIAGNOSIS — B001 Herpesviral vesicular dermatitis: Secondary | ICD-10-CM | POA: Diagnosis not present

## 2024-04-19 DIAGNOSIS — Z809 Family history of malignant neoplasm, unspecified: Secondary | ICD-10-CM | POA: Diagnosis not present

## 2024-04-19 DIAGNOSIS — Z87891 Personal history of nicotine dependence: Secondary | ICD-10-CM | POA: Insufficient documentation

## 2024-04-19 DIAGNOSIS — D539 Nutritional anemia, unspecified: Secondary | ICD-10-CM | POA: Diagnosis not present

## 2024-04-19 NOTE — Assessment & Plan Note (Signed)
 Lab review: 09/20/2023: Hemoglobin 11.4, MCV 108.2, WBC 5.8, platelets 151, TSH 7.11, creatinine 1.21 03/20/2024: Hemoglobin 12.4, MCV 105.1, platelets 161, folate greater than 23, B12 411  Based on normal B12 and folic acid differential diagnosis for macrocytosis includes Medications Myelodysplasia  Since the hemoglobin levels are stable there is no indication to do a bone marrow biopsy.

## 2024-04-19 NOTE — Progress Notes (Signed)
 [facility] CONSULT NOTE  Patient Care Team: Chrystal Lamarr RAMAN, MD as PCP - General (Family Medicine) Michele Richardson, DO as PCP - Cardiology (Cardiology) Delice Race, MD (Inactive) as Consulting Physician (Family Medicine)  CHIEF COMPLAINTS/PURPOSE OF CONSULTATION:  Evaluation of macrocytosis  HISTORY OF PRESENTING ILLNESS:   History of Present Illness Norman Bona. is a 73 year old male with macrocytic anemia who presents for evaluation of enlarged red blood cells. He was referred by his primary care physician for evaluation of macrocytic anemia.  He has a history of macrocytic anemia with enlarged red blood cells initially identified by his primary care physician. Blood transfusions were administered during hospitalizations for surgeries, with significant volumes given approximately three years ago.  He takes Valtrex  500 mg twice daily for recurrent herpes outbreaks, which cause fatigue and lip lesions under stress.  Blood work from January 2024 showed normal MCV but anemia. The first detection of increased red cell size was in October 2024. Recent tests show hemoglobin at 12.4, slightly below normal, with MCV fluctuating between 105 and 108.  He denies alcohol  consumption and has an allergy to Bactrim, which caused a severe reaction in the past.    I reviewed her records extensively and collaborated the history with the patient.   MEDICAL HISTORY:  Past Medical History:  Diagnosis Date   Acne    Anxiety    takes Xanax  daily as needed   Arthritis    Atrial flutter, paroxysmal (HCC)    History of kidney stones    Hyperlipidemia    takes Pravastatin  daily   Hypertension    takes Amlodipine  and Valsartan -HCTZ daily   Hypothyroidism    takes Synthroid  daily   Inguinal hernia    left side   Internal hemorrhoids    Joint pain    Kidney stones    Pre-diabetes    Rosacea    TIA (transient ischemic attack) 06/2019    SURGICAL  HISTORY: Past Surgical History:  Procedure Laterality Date   BIOPSY  08/03/2023   Procedure: BIOPSY;  Surgeon: Elicia Claw, MD;  Location: [facility];  Service: Gastroenterology;;   CARDIOVERSION N/A 08/28/2022   Procedure: CARDIOVERSION;  Surgeon: Ladona Heinz, MD;  Location: [facility];  Service: Cardiovascular;  Laterality: N/A;   CARDIOVERSION N/A 08/29/2022   Procedure: CARDIOVERSION;  Surgeon: Ladona Heinz, MD;  Location: [facility];  Service: Cardiovascular;  Laterality: N/A;   COLONOSCOPY     ESOPHAGOGASTRODUODENOSCOPY N/A 05/29/2023   Procedure: ESOPHAGOGASTRODUODENOSCOPY (EGD);  Surgeon: Saintclair Jasper, MD;  Location: [facility] ENDOSCOPY;  Service: Gastroenterology;  Laterality: N/A;   ESOPHAGOGASTRODUODENOSCOPY (EGD) WITH PROPOFOL  N/A 08/03/2023   Procedure: ESOPHAGOGASTRODUODENOSCOPY (EGD) WITH PROPOFOL ;  Surgeon: Elicia Claw, MD;  Location: [facility];  Service: Gastroenterology;  Laterality: N/A;   EXTRACORPOREAL SHOCK WAVE LITHOTRIPSY Right 12/27/2019   Procedure: EXTRACORPOREAL SHOCK WAVE LITHOTRIPSY (ESWL);  Surgeon: Sherrilee Belvie CROME, MD;  Location: [facility];  Service: Urology;  Laterality: Right;   FOREIGN BODY REMOVAL ESOPHAGEAL  08/03/2023   Procedure: REMOVAL FOREIGN BODY ESOPHAGEAL;  Surgeon: Elicia Claw, MD;  Location: [facility];  Service: Gastroenterology;;   IMPACTION REMOVAL  05/29/2023   Procedure: IMPACTION REMOVAL;  Surgeon: Saintclair Jasper, MD;  Location: [facility] ENDOSCOPY;  Service: Gastroenterology;;  Food removed with roth net   IR US  GUIDE BX ASP/DRAIN  09/06/2022   LITHOTRIPSY  09/2015   LUMBAR LAMINECTOMY WITH COFLEX 1 LEVEL N/A 07/07/2015   Procedure: LUMBAR FOUR-FIVE LUMBAR LAMINECTOMY WITH COFLEX;  Surgeon:  Alm GORMAN Molt, MD;  Location: [facility]UR[facility];  Service: Neurosurgery;  Laterality: N/A;   TONSILLECTOMY     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   Procedure: TOTAL HIP ARTHROPLASTY;  Surgeon: Toribio Silos, MD;  Location: [facility] OR;   Service: Orthopedics;  Laterality: Left;   TOTAL HIP ARTHROPLASTY Right 04/24/2021   Procedure: TOTAL HIP ARTHROPLASTY;  Surgeon: Silos Toribio, MD;  Location: [facility];  Service: Orthopedics;  Laterality: Right;   TOTAL HIP REVISION Right 08/23/2022   Procedure: TOTAL HIP REVISION. HEAD AND LINER EXCHANGE. POSSIBLE CUP REVISION;  Surgeon: Edna Toribio LABOR, MD;  Location: [facility];  Service: Orthopedics;  Laterality: Right;    SOCIAL HISTORY: Social History   Socioeconomic History   Marital status: Married    Spouse name: Not on file   Number of children: 2   Years of education: Not on file   Highest education level: Bachelor's degree (e.g., BA, AB, BS)  Occupational History   Not on file  Tobacco Use   Smoking status: Former    Current packs/day: 0.00    Average packs/day: 1 pack/day for 15.0 years (15.0 ttl pk-yrs)    Types: Cigarettes    Start date: 02/12/1974    Quit date: 02/12/1989    Years since quitting: 35.2   Smokeless tobacco: Never   Tobacco comments:    quit smoking in 1991  Vaping Use   Vaping status: Never Used  Substance and Sexual Activity   Alcohol  use: No   Drug use: No   Sexual activity: Not on file  Other Topics Concern   Not on file  Social History Narrative   Lives at home with wife   4 cups of caffeine/day   Social Drivers of Corporate investment banker Strain: Not on file  Food Insecurity: No Food Insecurity (04/19/2024)   Hunger Vital Sign    Worried About Running Out of Food in the Last Year: Never true    Ran Out of Food in the Last Year: Never true  Transportation Needs: No Transportation Needs (04/19/2024)   PRAPARE - Administrator, Civil Service (Medical): No    Lack of Transportation (Non-Medical): No  Physical Activity: Not on file  Stress: Not on file  Social Connections: Not on file  Intimate Partner Violence: Not At Risk (04/19/2024)   Humiliation, Afraid, Rape, and Kick questionnaire    Fear of Current or Ex-Partner:  No    Emotionally Abused: No    Physically Abused: No    Sexually Abused: No    FAMILY HISTORY: Family History  Problem Relation Age of Onset   Hypertension Father    Sleep disorder Father    CVA Father    Cancer Maternal Grandfather    Hypertension Paternal Grandmother    Diabetes Paternal Grandmother    Alcoholism Mother    Hypertension Sister    Aneurysm Sister    Atrial fibrillation Sister    Other Neg Hx        hypogonadism   Sleep apnea Neg Hx     ALLERGIES:  is allergic to bactrim [sulfamethoxazole-trimethoprim].  MEDICATIONS:  Current Outpatient Medications  Medication Sig Dispense Refill   acetaminophen  (TYLENOL ) 325 MG tablet Take 650 mg by mouth every 6 (six) hours as needed for moderate pain.     cholecalciferol  (VITAMIN D3) 25 MCG (1000 UT) tablet Take 1,000 Units by mouth daily.     clobetasol ointment (TEMOVATE) 0.05 % Apply 1 Application topically 2 (two)  times daily as needed (itching).     clonazePAM  (KLONOPIN ) 0.5 MG tablet TAKE ONE TABLET BY MOUTH AT BEDTIME 90 tablet 1   dronedarone  (MULTAQ ) 400 MG tablet Take 1 tablet (400 mg total) by mouth 2 (two) times daily with a meal. 180 tablet 3   ELIQUIS  5 MG TABS tablet TAKE ONE TABLET BY MOUTH TWICE DAILY 180 tablet 1   hydrochlorothiazide  (HYDRODIURIL ) 25 MG tablet Take 1 tablet (25 mg total) by mouth every morning. 90 tablet 2   metFORMIN  (GLUCOPHAGE ) 500 MG tablet Take 500 mg by mouth 2 (two) times daily with a meal.     metoprolol  succinate (TOPROL -XL) 25 MG 24 hr tablet Take 1 tablet (25 mg total) by mouth every morning. 90 tablet 2   Multiple Vitamins-Minerals (MULTIVITAMIN WITH MINERALS) tablet Take 1 tablet by mouth daily.     Multiple Vitamins-Minerals (PRESERVISION AREDS 2 PO) Take 1 capsule by mouth daily.     olmesartan  (BENICAR ) 40 MG tablet Take 1 tablet (40 mg total) by mouth daily at 10 pm. 90 tablet 2   pyridOXINE  (VITAMIN B-6) 100 MG tablet Take 100 mg by mouth daily.     rosuvastatin   (CRESTOR ) 20 MG tablet Take 1 tablet (20 mg total) by mouth daily. 90 tablet 3   spironolactone  (ALDACTONE ) 25 MG tablet Take 1 tablet (25 mg total) by mouth daily. 90 tablet 3   SYNTHROID  88 MCG tablet Take 88 mcg by mouth daily before breakfast. (Patient not taking: Reported on 04/19/2024)     valACYclovir  (VALTREX ) 1000 MG tablet Take 1,000 mg by mouth daily as needed (Herpes flare). (Patient not taking: Reported on 04/19/2024)     No current facility-administered medications for this visit.    REVIEW OF SYSTEMS:   Constitutional: Denies fevers, chills or abnormal night sweats All other systems were reviewed with the patient and are negative.  PHYSICAL EXAMINATION: ECOG PERFORMANCE STATUS: 1 - Symptomatic but completely ambulatory  Vitals:   04/19/24 1211  BP: 130/72  Pulse: 73  Resp: 18  Temp: 97.8 F (36.6 C)  SpO2: 100%   Filed Weights   04/19/24 1211  Weight: 204 lb (92.5 kg)    GENERAL:alert, no distress and comfortable  LABORATORY DATA:  I have reviewed the data as listed Lab Results  Component Value Date   WBC 6.1 08/03/2023   HGB 12.9 (L) 08/03/2023   HCT 37.4 (L) 08/03/2023   MCV 107.5 (H) 08/03/2023   PLT 144 (L) 08/03/2023   Lab Results  Component Value Date   NA 139 08/03/2023   K 3.9 08/03/2023   CL 109 08/03/2023   CO2 21 (L) 08/03/2023    RADIOGRAPHIC STUDIES: I have personally reviewed the radiological reports and agreed with the findings in the report.  ASSESSMENT AND PLAN:  Macrocytic anemia Lab review: 09/20/2023: Hemoglobin 11.4, MCV 108.2, WBC 5.8, platelets 151, TSH 7.11, creatinine 1.21 03/20/2024: Hemoglobin 12.4, MCV 105.1, platelets 161, folate greater than 23, B12 411  Based on normal B12 and folic acid differential diagnosis for macrocytosis includes Medications: Valtrex  can cause macrocytosis but he has been taking Valtrex  for about 30 years.  He would reduce the Valtrex  to 500 mg daily. Myelodysplasia  Since the hemoglobin  levels are stable there is no indication to do a bone marrow biopsy at this time. I would like to recheck his CBC and follow-up in 3 months.  Assessment & Plan Macrocytic anemia Macrocytic anemia with increased MCV, mild anemia, normal B12 and folate. Medication-induced  macrocytosis unlikely. Myelodysplasia unlikely without significant blood count changes. Bone marrow biopsy not indicated. - Continue current medications, including Valtrex . - Reduce Valtrex  dose to 500 mg daily if possible. - Repeat blood work in three months at the clinic's lab. - Avoid B12 supplements for one week before the next blood test. - Schedule follow-up appointment.  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 Outbreaks occur under stress. - Continue Valtrex  at a reduced dose of 500 mg daily if possible. - Increase Valtrex  dose during outbreaks as needed.    All questions were answered. The patient knows to call the clinic with any problems, questions or concerns.    Norman K Albaro Deviney, MD 04/19/24

## 2024-04-20 ENCOUNTER — Encounter: Payer: Self-pay | Admitting: Cardiology

## 2024-04-23 ENCOUNTER — Encounter: Payer: Self-pay | Admitting: Hematology and Oncology

## 2024-05-08 DIAGNOSIS — E7800 Pure hypercholesterolemia, unspecified: Secondary | ICD-10-CM | POA: Diagnosis not present

## 2024-05-08 DIAGNOSIS — E119 Type 2 diabetes mellitus without complications: Secondary | ICD-10-CM | POA: Diagnosis not present

## 2024-05-08 DIAGNOSIS — M1610 Unilateral primary osteoarthritis, unspecified hip: Secondary | ICD-10-CM | POA: Diagnosis not present

## 2024-05-08 DIAGNOSIS — I4891 Unspecified atrial fibrillation: Secondary | ICD-10-CM | POA: Diagnosis not present

## 2024-05-24 ENCOUNTER — Other Ambulatory Visit: Payer: Self-pay | Admitting: Cardiology

## 2024-05-25 ENCOUNTER — Telehealth: Payer: Self-pay | Admitting: Cardiology

## 2024-05-25 MED ORDER — SPIRONOLACTONE 25 MG PO TABS: 25.0000 mg | ORAL_TABLET | Freq: Every day | ORAL | 3 refills | Status: AC

## 2024-05-25 NOTE — Telephone Encounter (Signed)
*  STAT* If patient is at the pharmacy, call can be transferred to refill team.   1. Which medications need to be refilled? (please list name of each medication and dose if known)   spironolactone  (ALDACTONE ) 25 MG tablet    2. Which pharmacy/location (including street and city if local pharmacy) is medication to be sent to?  [facility] [place], [place] - [address]      3. Do they need a 30 day or 90 day supply? 90 day    Pt is out of medication

## 2024-05-25 NOTE — Telephone Encounter (Signed)
 Refill sent.

## 2024-06-08 DIAGNOSIS — E7800 Pure hypercholesterolemia, unspecified: Secondary | ICD-10-CM | POA: Diagnosis not present

## 2024-06-08 DIAGNOSIS — M1610 Unilateral primary osteoarthritis, unspecified hip: Secondary | ICD-10-CM | POA: Diagnosis not present

## 2024-06-08 DIAGNOSIS — E119 Type 2 diabetes mellitus without complications: Secondary | ICD-10-CM | POA: Diagnosis not present

## 2024-06-08 DIAGNOSIS — I4891 Unspecified atrial fibrillation: Secondary | ICD-10-CM | POA: Diagnosis not present

## 2024-06-10 ENCOUNTER — Other Ambulatory Visit: Payer: Self-pay | Admitting: Adult Health

## 2024-06-11 ENCOUNTER — Encounter: Payer: Self-pay | Admitting: Adult Health

## 2024-06-12 MED ORDER — CLONAZEPAM 0.5 MG PO TABS: 0.5000 mg | ORAL_TABLET | Freq: Every day | ORAL | 1 refills | Status: AC

## 2024-07-03 DIAGNOSIS — L821 Other seborrheic keratosis: Secondary | ICD-10-CM | POA: Diagnosis not present

## 2024-07-03 DIAGNOSIS — L905 Scar conditions and fibrosis of skin: Secondary | ICD-10-CM | POA: Diagnosis not present

## 2024-07-03 DIAGNOSIS — L820 Inflamed seborrheic keratosis: Secondary | ICD-10-CM | POA: Diagnosis not present

## 2024-07-03 DIAGNOSIS — D1801 Hemangioma of skin and subcutaneous tissue: Secondary | ICD-10-CM | POA: Diagnosis not present

## 2024-07-06 DIAGNOSIS — G4733 Obstructive sleep apnea (adult) (pediatric): Secondary | ICD-10-CM | POA: Diagnosis not present

## 2024-07-11 DIAGNOSIS — G4733 Obstructive sleep apnea (adult) (pediatric): Secondary | ICD-10-CM | POA: Diagnosis not present

## 2024-07-16 ENCOUNTER — Inpatient Hospital Stay: Attending: Hematology and Oncology

## 2024-07-16 DIAGNOSIS — D539 Nutritional anemia, unspecified: Secondary | ICD-10-CM | POA: Diagnosis present

## 2024-07-16 LAB — CMP (CANCER CENTER ONLY)
ALT: 35 U/L (ref 0–44)
AST: 32 U/L (ref 15–41)
Albumin: 4.7 g/dL (ref 3.5–5.0)
Alkaline Phosphatase: 48 U/L (ref 38–126)
Anion gap: 12 (ref 5–15)
BUN: 21 mg/dL (ref 8–23)
CO2: 25 mmol/L (ref 22–32)
Calcium: 9.7 mg/dL (ref 8.9–10.3)
Chloride: 103 mmol/L (ref 98–111)
Creatinine: 1.13 mg/dL (ref 0.61–1.24)
GFR, Estimated: >60 mL/min (ref >60)
Glucose, Bld: 139 mg/dL — ABNORMAL HIGH (ref 70–99)
Potassium: 4.5 mmol/L (ref 3.5–5.1)
Sodium: 140 mmol/L (ref 135–145)
Total Bilirubin: 0.4 mg/dL (ref 0.0–1.2)
Total Protein: 7.7 g/dL (ref 6.5–8.1)

## 2024-07-16 LAB — CBC WITH DIFFERENTIAL (CANCER CENTER ONLY)
Abs Immature Granulocytes: 0.06 K/uL (ref 0.00–0.07)
Basophils Absolute: 0 K/uL (ref 0.0–0.1)
Basophils Relative: 1 %
Eosinophils Absolute: 0.1 K/uL (ref 0.0–0.5)
Eosinophils Relative: 2 %
HCT: 33.5 % — ABNORMAL LOW (ref 39.0–52.0)
Hemoglobin: 11.5 g/dL — ABNORMAL LOW (ref 13.0–17.0)
Immature Granulocytes: 1 %
Lymphocytes Relative: 32 %
Lymphs Abs: 1.9 K/uL (ref 0.7–4.0)
MCH: 36.4 pg — ABNORMAL HIGH (ref 26.0–34.0)
MCHC: 34.3 g/dL (ref 30.0–36.0)
MCV: 106 fL — ABNORMAL HIGH (ref 80.0–100.0)
Monocytes Absolute: 0.8 K/uL (ref 0.1–1.0)
Monocytes Relative: 14 %
Neutro Abs: 3.1 K/uL (ref 1.7–7.7)
Neutrophils Relative %: 50 %
Platelet Count: 145 K/uL — ABNORMAL LOW (ref 150–400)
RBC: 3.16 MIL/uL — ABNORMAL LOW (ref 4.22–5.81)
RDW: 12.3 % (ref 11.5–15.5)
WBC Count: 6 K/uL (ref 4.0–10.5)
nRBC: 0 % (ref 0.0–0.2)

## 2024-07-16 LAB — VITAMIN B12: Vitamin B-12: 1164 pg/mL — ABNORMAL HIGH (ref 180–914)

## 2024-07-17 LAB — FOLATE RBC
Folate, Hemolysate: 496 ng/mL
Folate, RBC: 1434 ng/mL (ref >498)
Hematocrit: 34.6 % — ABNORMAL LOW (ref 37.5–51.0)

## 2024-07-19 ENCOUNTER — Inpatient Hospital Stay: Admitting: Hematology and Oncology

## 2024-07-19 DIAGNOSIS — D539 Nutritional anemia, unspecified: Secondary | ICD-10-CM | POA: Diagnosis not present

## 2024-07-19 NOTE — Assessment & Plan Note (Signed)
 Lab review: 09/20/2023: Hemoglobin 11.4, MCV 108.2, WBC 5.8, platelets 151, TSH 7.11, creatinine 1.21 03/20/2024: Hemoglobin 12.4, MCV 105.1, platelets 161, folate greater than 23, B12 411 07/16/2024: Hemoglobin 11.5, MCV 106, platelets 145, folate: Normal, B12: 1164   Based on normal B12 and folic acid  differential diagnosis for macrocytosis includes Medications: Valtrex  can cause macrocytosis but he has been taking Valtrex  for about 30 years.  He would reduce the Valtrex  to 500 mg daily. Myelodysplasia   Since the hemoglobin levels are stable there is no indication to do a bone marrow biopsy at this time. I would like to recheck his CBC and follow-up in 6 months.

## 2024-07-19 NOTE — Progress Notes (Signed)
 HEMATOLOGY-ONCOLOGY TELEPHONE VISIT PROGRESS NOTE  I connected with our patient on 07/19/2024 at  3:30 PM EST by telephone and verified that I am speaking with the correct person using two identifiers.  I discussed the limitations, risks, security and privacy concerns of performing an evaluation and management service by telephone and the availability of in person appointments.  I also discussed with the patient that there may be a patient responsible charge related to this service. The patient expressed understanding and agreed to proceed.   History of Present Illness: Telephone follow-up to discuss results of blood work History of Present Illness  toI would like to follow-upWilliam Jacob Donahue Jr. is a 73 year old male with chronic macrocytic anemia who presents for hematology follow-up and review of laboratory results.  He has chronic mild macrocytic anemia with hemoglobin historically 11 to 12.9 g/dL, most recently 88.4 g/dL. Red blood cell count is mildly decreased but above the threshold for invasive evaluation. Platelets are stable at 145, and white blood cell count is normal.  He has mild intermittent fatigue, napping 30 to 60 minutes two to three days per week, without limitation of daily activities. He denies fever, chills, or night sweats.  He takes vitamin B12 500 mcg daily and is considering decreasing the dose due to prior elevated B12 levels. He does not use other supplements and asks if additional vitamins are needed.  He is concerned about a temporal association between blood count changes, COVID-19 vaccinations, and recent surgeries, and he is hesitant to receive the most recent COVID-19 vaccine.  Apr 19, 2024: Evaluation of macrocytic anemia with mild anemia and increased MCV; B12 and folate levels normal, myelodysplasia considered unlikely, bone marrow biopsy not indicated. Medication-induced macrocytosis discussed, Valtrex  dose reduced to 500 mg daily, and plan for repeat  blood work in three months. Recurrent herpes labialis managed with Valtrex , dosage adjustment during outbreaks.  REVIEW OF SYSTEMS:   Constitutional: Denies fevers, chills or abnormal weight loss All other systems were reviewed with the patient and are negative. Observations/Objective:     Assessment Plan:  Macrocytic anemia Lab review: 09/20/2023: Hemoglobin 11.4, MCV 108.2, WBC 5.8, platelets 151, TSH 7.11, creatinine 1.21 03/20/2024: Hemoglobin 12.4, MCV 105.1, platelets 161, folate greater than 23, B12 411 07/16/2024: Hemoglobin 11.5, MCV 106, platelets 145, folate: Normal, B12: 1164   Based on normal B12 and folic acid  differential diagnosis for macrocytosis includes Medications: Valtrex  can cause macrocytosis but he has been taking Valtrex  for about 30 years.  He would reduce the Valtrex  to 500 mg daily. Myelodysplasia   Since the hemoglobin levels are stable there is no indication to do a bone marrow biopsy at this time. Patient will get labs with his primary care physician in 6 months.  Reviewed the lab work after his appointment with his PCP. If the hemoglobin drops to below 10 then we will plan to do a bone marrow biopsy.  Assessment & Plan Macrocytic anemia Chronic mild macrocytic anemia with hemoglobin 11.5 g/dL, mild intermittent fatigue, no bone marrow pathology or malignancy. - Monitor hemoglobin and CBC every six months. - Primary care to perform blood work every six months and review results. - Bone marrow biopsy if hemoglobin drops below 10 g/dL. - Reduce B12 supplementation to three to four days per week. - No additional vitamin or supplement recommendations.      I discussed the assessment and treatment plan with the patient. The patient was provided an opportunity to ask questions and all were answered. The patient agreed  with the plan and demonstrated an understanding of the instructions. The patient was advised to call back or seek an in-person evaluation if  the symptoms worsen or if the condition fails to improve as anticipated.   I provided 20 minutes of non-face-to-face time during this encounter.  This includes time for charting and coordination of care   Naomi MARLA Chad, MD

## 2024-07-20 ENCOUNTER — Telehealth: Payer: Self-pay | Admitting: Hematology and Oncology

## 2024-08-05 DIAGNOSIS — G4733 Obstructive sleep apnea (adult) (pediatric): Secondary | ICD-10-CM | POA: Diagnosis not present

## 2024-09-05 ENCOUNTER — Encounter: Payer: Self-pay | Admitting: Hematology and Oncology

## 2024-10-15 ENCOUNTER — Ambulatory Visit: Admitting: Adult Health

## 2024-11-01 ENCOUNTER — Ambulatory Visit: Admitting: Cardiology
